# Patient Record
Sex: Male | Born: 1955
Health system: Southern US, Community
[De-identification: ages and names within clinical notes are randomized; demographics above are authoritative.]

## PROBLEM LIST (undated history)

## (undated) DIAGNOSIS — I10 Essential (primary) hypertension: Secondary | ICD-10-CM

## (undated) DIAGNOSIS — M9110 Juvenile osteochondrosis of head of femur [Legg-Calve-Perthes], unspecified leg: Secondary | ICD-10-CM

## (undated) DIAGNOSIS — E119 Type 2 diabetes mellitus without complications: Secondary | ICD-10-CM

## (undated) DIAGNOSIS — E785 Hyperlipidemia, unspecified: Secondary | ICD-10-CM

## (undated) DIAGNOSIS — J45909 Unspecified asthma, uncomplicated: Secondary | ICD-10-CM

## (undated) DIAGNOSIS — K219 Gastro-esophageal reflux disease without esophagitis: Secondary | ICD-10-CM

## (undated) HISTORY — DX: Juvenile osteochondrosis of head of femur (Legg-Calve-Perthes), unspecified leg: M91.10

## (undated) HISTORY — PX: HIP SURGERY: SHX245

## (undated) HISTORY — DX: Type 2 diabetes mellitus without complications: E11.9

## (undated) HISTORY — PX: OTHER SURGICAL HISTORY: SHX169

## (undated) HISTORY — PX: CARDIAC CATHETERIZATION: SHX172

## (undated) HISTORY — DX: Hyperlipidemia, unspecified: E78.5

## (undated) HISTORY — DX: Unspecified asthma, uncomplicated: J45.909

## (undated) HISTORY — DX: Essential (primary) hypertension: I10

---

## 2016-02-26 DIAGNOSIS — K219 Gastro-esophageal reflux disease without esophagitis: Secondary | ICD-10-CM | POA: Diagnosis not present

## 2016-02-26 DIAGNOSIS — J45909 Unspecified asthma, uncomplicated: Secondary | ICD-10-CM | POA: Diagnosis not present

## 2016-02-26 DIAGNOSIS — Z23 Encounter for immunization: Secondary | ICD-10-CM | POA: Diagnosis not present

## 2016-02-26 DIAGNOSIS — R600 Localized edema: Secondary | ICD-10-CM | POA: Diagnosis not present

## 2016-02-26 DIAGNOSIS — E291 Testicular hypofunction: Secondary | ICD-10-CM | POA: Diagnosis not present

## 2016-02-26 DIAGNOSIS — E559 Vitamin D deficiency, unspecified: Secondary | ICD-10-CM | POA: Diagnosis not present

## 2016-02-26 DIAGNOSIS — Z7689 Persons encountering health services in other specified circumstances: Secondary | ICD-10-CM | POA: Diagnosis not present

## 2016-02-26 DIAGNOSIS — E7800 Pure hypercholesterolemia, unspecified: Secondary | ICD-10-CM | POA: Diagnosis not present

## 2016-02-26 DIAGNOSIS — I10 Essential (primary) hypertension: Secondary | ICD-10-CM | POA: Diagnosis not present

## 2016-02-26 DIAGNOSIS — E119 Type 2 diabetes mellitus without complications: Secondary | ICD-10-CM | POA: Diagnosis not present

## 2016-02-26 DIAGNOSIS — M545 Low back pain: Secondary | ICD-10-CM | POA: Diagnosis not present

## 2016-03-04 DIAGNOSIS — E1140 Type 2 diabetes mellitus with diabetic neuropathy, unspecified: Secondary | ICD-10-CM | POA: Diagnosis not present

## 2016-03-04 DIAGNOSIS — K219 Gastro-esophageal reflux disease without esophagitis: Secondary | ICD-10-CM | POA: Diagnosis not present

## 2016-03-04 DIAGNOSIS — F320 Major depressive disorder, single episode, mild: Secondary | ICD-10-CM | POA: Diagnosis not present

## 2016-03-04 DIAGNOSIS — J45909 Unspecified asthma, uncomplicated: Secondary | ICD-10-CM | POA: Diagnosis not present

## 2016-03-28 DIAGNOSIS — E1165 Type 2 diabetes mellitus with hyperglycemia: Secondary | ICD-10-CM | POA: Diagnosis not present

## 2016-03-30 DIAGNOSIS — E1140 Type 2 diabetes mellitus with diabetic neuropathy, unspecified: Secondary | ICD-10-CM | POA: Diagnosis not present

## 2016-03-30 DIAGNOSIS — L0292 Furuncle, unspecified: Secondary | ICD-10-CM | POA: Diagnosis not present

## 2016-03-30 DIAGNOSIS — I10 Essential (primary) hypertension: Secondary | ICD-10-CM | POA: Diagnosis not present

## 2016-03-30 DIAGNOSIS — Z794 Long term (current) use of insulin: Secondary | ICD-10-CM | POA: Diagnosis not present

## 2016-06-29 DIAGNOSIS — E559 Vitamin D deficiency, unspecified: Secondary | ICD-10-CM | POA: Diagnosis not present

## 2016-06-29 DIAGNOSIS — J45909 Unspecified asthma, uncomplicated: Secondary | ICD-10-CM | POA: Diagnosis not present

## 2016-06-29 DIAGNOSIS — F320 Major depressive disorder, single episode, mild: Secondary | ICD-10-CM | POA: Diagnosis not present

## 2016-06-29 DIAGNOSIS — L709 Acne, unspecified: Secondary | ICD-10-CM | POA: Diagnosis not present

## 2016-06-29 DIAGNOSIS — E1140 Type 2 diabetes mellitus with diabetic neuropathy, unspecified: Secondary | ICD-10-CM | POA: Diagnosis not present

## 2016-06-29 DIAGNOSIS — E119 Type 2 diabetes mellitus without complications: Secondary | ICD-10-CM | POA: Diagnosis not present

## 2016-06-29 DIAGNOSIS — E7800 Pure hypercholesterolemia, unspecified: Secondary | ICD-10-CM | POA: Diagnosis not present

## 2016-07-06 DIAGNOSIS — I10 Essential (primary) hypertension: Secondary | ICD-10-CM | POA: Diagnosis not present

## 2016-07-06 DIAGNOSIS — Z125 Encounter for screening for malignant neoplasm of prostate: Secondary | ICD-10-CM | POA: Diagnosis not present

## 2016-07-06 DIAGNOSIS — E1140 Type 2 diabetes mellitus with diabetic neuropathy, unspecified: Secondary | ICD-10-CM | POA: Diagnosis not present

## 2016-07-06 DIAGNOSIS — E349 Endocrine disorder, unspecified: Secondary | ICD-10-CM | POA: Diagnosis not present

## 2016-07-06 DIAGNOSIS — Z1321 Encounter for screening for nutritional disorder: Secondary | ICD-10-CM | POA: Diagnosis not present

## 2016-07-06 DIAGNOSIS — G8929 Other chronic pain: Secondary | ICD-10-CM | POA: Diagnosis not present

## 2016-07-06 DIAGNOSIS — L709 Acne, unspecified: Secondary | ICD-10-CM | POA: Diagnosis not present

## 2016-07-06 DIAGNOSIS — J45909 Unspecified asthma, uncomplicated: Secondary | ICD-10-CM | POA: Diagnosis not present

## 2016-07-14 DIAGNOSIS — Z79899 Other long term (current) drug therapy: Secondary | ICD-10-CM | POA: Diagnosis not present

## 2016-07-14 DIAGNOSIS — M461 Sacroiliitis, not elsewhere classified: Secondary | ICD-10-CM | POA: Diagnosis not present

## 2016-07-14 DIAGNOSIS — Z794 Long term (current) use of insulin: Secondary | ICD-10-CM | POA: Diagnosis not present

## 2016-07-14 DIAGNOSIS — M549 Dorsalgia, unspecified: Secondary | ICD-10-CM | POA: Diagnosis not present

## 2016-07-14 DIAGNOSIS — G8929 Other chronic pain: Secondary | ICD-10-CM | POA: Diagnosis not present

## 2016-07-14 DIAGNOSIS — E119 Type 2 diabetes mellitus without complications: Secondary | ICD-10-CM | POA: Diagnosis not present

## 2016-07-14 DIAGNOSIS — Z5181 Encounter for therapeutic drug level monitoring: Secondary | ICD-10-CM | POA: Diagnosis not present

## 2016-08-01 DIAGNOSIS — E1165 Type 2 diabetes mellitus with hyperglycemia: Secondary | ICD-10-CM | POA: Diagnosis not present

## 2016-10-12 DIAGNOSIS — E7800 Pure hypercholesterolemia, unspecified: Secondary | ICD-10-CM | POA: Diagnosis not present

## 2016-10-12 DIAGNOSIS — F17200 Nicotine dependence, unspecified, uncomplicated: Secondary | ICD-10-CM | POA: Diagnosis not present

## 2016-10-12 DIAGNOSIS — J45909 Unspecified asthma, uncomplicated: Secondary | ICD-10-CM | POA: Diagnosis not present

## 2016-10-12 DIAGNOSIS — E119 Type 2 diabetes mellitus without complications: Secondary | ICD-10-CM | POA: Diagnosis not present

## 2016-10-12 DIAGNOSIS — Z23 Encounter for immunization: Secondary | ICD-10-CM | POA: Diagnosis not present

## 2016-10-26 DIAGNOSIS — E349 Endocrine disorder, unspecified: Secondary | ICD-10-CM | POA: Diagnosis not present

## 2017-03-03 ENCOUNTER — Ambulatory Visit: Payer: Self-pay | Admitting: Family Medicine

## 2017-03-28 ENCOUNTER — Ambulatory Visit: Payer: Self-pay | Admitting: Family Medicine

## 2017-04-21 ENCOUNTER — Ambulatory Visit (INDEPENDENT_AMBULATORY_CARE_PROVIDER_SITE_OTHER): Payer: Medicare Other | Admitting: Family Medicine

## 2017-04-21 ENCOUNTER — Encounter: Payer: Self-pay | Admitting: Family Medicine

## 2017-04-21 DIAGNOSIS — G2581 Restless legs syndrome: Secondary | ICD-10-CM | POA: Diagnosis not present

## 2017-04-21 DIAGNOSIS — J4540 Moderate persistent asthma, uncomplicated: Secondary | ICD-10-CM

## 2017-04-21 DIAGNOSIS — R519 Headache, unspecified: Secondary | ICD-10-CM

## 2017-04-21 DIAGNOSIS — E291 Testicular hypofunction: Secondary | ICD-10-CM | POA: Diagnosis not present

## 2017-04-21 DIAGNOSIS — F329 Major depressive disorder, single episode, unspecified: Secondary | ICD-10-CM | POA: Insufficient documentation

## 2017-04-21 DIAGNOSIS — R251 Tremor, unspecified: Secondary | ICD-10-CM | POA: Diagnosis not present

## 2017-04-21 DIAGNOSIS — E119 Type 2 diabetes mellitus without complications: Secondary | ICD-10-CM

## 2017-04-21 DIAGNOSIS — J45909 Unspecified asthma, uncomplicated: Secondary | ICD-10-CM | POA: Insufficient documentation

## 2017-04-21 DIAGNOSIS — J309 Allergic rhinitis, unspecified: Secondary | ICD-10-CM | POA: Diagnosis not present

## 2017-04-21 DIAGNOSIS — E1165 Type 2 diabetes mellitus with hyperglycemia: Secondary | ICD-10-CM | POA: Insufficient documentation

## 2017-04-21 DIAGNOSIS — R51 Headache: Secondary | ICD-10-CM

## 2017-04-21 DIAGNOSIS — F3341 Major depressive disorder, recurrent, in partial remission: Secondary | ICD-10-CM | POA: Diagnosis not present

## 2017-04-21 DIAGNOSIS — I10 Essential (primary) hypertension: Secondary | ICD-10-CM | POA: Diagnosis not present

## 2017-04-21 DIAGNOSIS — E1159 Type 2 diabetes mellitus with other circulatory complications: Secondary | ICD-10-CM | POA: Insufficient documentation

## 2017-04-21 DIAGNOSIS — Z794 Long term (current) use of insulin: Secondary | ICD-10-CM

## 2017-04-21 DIAGNOSIS — IMO0001 Reserved for inherently not codable concepts without codable children: Secondary | ICD-10-CM

## 2017-04-21 DIAGNOSIS — E1169 Type 2 diabetes mellitus with other specified complication: Secondary | ICD-10-CM | POA: Insufficient documentation

## 2017-04-21 DIAGNOSIS — E785 Hyperlipidemia, unspecified: Secondary | ICD-10-CM | POA: Insufficient documentation

## 2017-04-21 MED ORDER — EMPAGLIFLOZIN-LINAGLIPTIN 25-5 MG PO TABS: 1.0000 | ORAL_TABLET | Freq: Every day | ORAL | 3 refills | Status: DC

## 2017-04-21 MED ORDER — LISINOPRIL 10 MG PO TABS: 10.0000 mg | ORAL_TABLET | Freq: Every day | ORAL | 3 refills | Status: DC

## 2017-04-21 MED ORDER — CITALOPRAM HYDROBROMIDE 10 MG PO TABS: 10.0000 mg | ORAL_TABLET | Freq: Every day | ORAL | 6 refills | Status: DC

## 2017-04-21 MED ORDER — PRIMIDONE 50 MG PO TABS: 100.0000 mg | ORAL_TABLET | Freq: Every day | ORAL | 3 refills | Status: DC

## 2017-04-21 MED ORDER — MELOXICAM 15 MG PO TABS: 15.0000 mg | ORAL_TABLET | Freq: Every day | ORAL | 3 refills | Status: DC

## 2017-04-21 MED ORDER — PREGABALIN 100 MG PO CAPS: 100.0000 mg | ORAL_CAPSULE | Freq: Every day | ORAL | 3 refills | Status: DC

## 2017-04-21 MED ORDER — AZELASTINE HCL 0.1 % NA SOLN
2.0000 | Freq: Two times a day (BID) | NASAL | 11 refills | Status: DC
Start: 2017-04-21 — End: 2017-06-05

## 2017-04-21 MED ORDER — INSULIN LISPRO 100 UNIT/ML (KWIKPEN): PEN_INJECTOR | SUBCUTANEOUS | 6 refills | Status: DC

## 2017-04-21 MED ORDER — AMITRIPTYLINE HCL 50 MG PO TABS: 50.0000 mg | ORAL_TABLET | Freq: Every day | ORAL | 3 refills | Status: DC

## 2017-04-21 MED ORDER — INSULIN GLARGINE 100 UNIT/ML ~~LOC~~ SOLN
75.0000 [IU] | Freq: Every day | SUBCUTANEOUS | 6 refills | Status: DC
Start: 2017-04-21 — End: 2017-09-19

## 2017-04-21 MED ORDER — FLUTICASONE-SALMETEROL 500-50 MCG/DOSE IN AEPB: 1.0000 | INHALATION_SPRAY | Freq: Two times a day (BID) | RESPIRATORY_TRACT | 11 refills | Status: DC

## 2017-04-21 MED ORDER — ALBUTEROL SULFATE HFA 108 (90 BASE) MCG/ACT IN AERS: 2.0000 | INHALATION_SPRAY | Freq: Four times a day (QID) | RESPIRATORY_TRACT | 11 refills | Status: DC | PRN

## 2017-04-21 MED ORDER — MONTELUKAST SODIUM 10 MG PO TABS: 10.0000 mg | ORAL_TABLET | Freq: Every day | ORAL | 11 refills | Status: DC

## 2017-04-21 NOTE — Progress Notes (Signed)
BP 96/62 (BP Location: Left Arm, Patient Position: Sitting, Cuff Size: Normal)   Pulse 94   Temp 98.4 F (36.9 C) (Tympanic)   Ht 5\' 8"  (1.727 m)   Wt 203 lb 9.6 oz (92.4 kg)   SpO2 95%   BMI 30.96 kg/m    Subjective:    Patient ID: Corey [name], male    DOB: 11-09-1955, 62 y.o.   MRN: [id number]  HPI: Corey [name] is a 62 y.o. male  Chief Complaint  Patient presents with  . Establish Care    Patient new to area, need new PCP.   Marland Kitchen. Medication Refill    All medications  . Shaking    Patient states he has been shaking for a while. Getting worse. Went to Insurance account manage[place]urologist [place]. Put patient on Magnesium. Was told it could be early stages of Parkinson's.    Pt here today to establish care.   Hx of insulin dependent DM. Does not check his sugars at home because his machine is broken. Has not been taking his humalog because he's had 2 episodes of hypoglycemia and he's scared to have it happen again. Taking 75 units of lantus once daily. Also taking glyxambi daily. Last A1C was about 6 months ago - doesn't remmeber number, just that it was high.   Taking albuterol and advair for asthma with good control. No concerns there. Does not smoke, quit about 30 years ago.   States he thinks he takes amitriptyline for headaches. Going fairly well.   Takes celexa for depression and feels its going well.   Also has a hx of disc issues in low back, thinks that is what he takes meloxicam for.   Lyrica is for feet and leg pains, only taking one nightly. Also taking 2 primidone nightly for his legs. Was previously being followed by a Neurologist for tremor, feels the magnesium is helping with that and does not want to find a new Neurologist in the area at this time.   Has been on testosterone supplementation, hoping he can come off if it isn't necessary.   Hx of allergies, takes astelin nasal spray and singulair for that.   Takes lisinopril and lasix currently for BPs, not checking at  home. Having some orthostatic sxs.   Last CPE was about 3 months ago.   Relevant past medical, surgical, family and social history reviewed and updated as indicated. Interim medical history since our last visit reviewed. Allergies and medications reviewed and updated.  Review of Systems  Per HPI unless specifically indicated above     Objective:    BP 96/62 (BP Location: Left Arm, Patient Position: Sitting, Cuff Size: Normal)   Pulse 94   Temp 98.4 F (36.9 C) (Tympanic)   Ht 5\' 8"  (1.727 m)   Wt 203 lb 9.6 oz (92.4 kg)   SpO2 95%   BMI 30.96 kg/m   Wt Readings from Last 3 Encounters:  04/21/17 203 lb 9.6 oz (92.4 kg)    Physical Exam  Constitutional: He is oriented to person, place, and time. He appears well-developed and well-nourished. No distress.  HENT:  Head: Atraumatic.  Eyes: Pupils are equal, round, and reactive to light. Conjunctivae are normal. No scleral icterus.  Neck: Normal range of motion. Neck supple.  Cardiovascular: Normal rate and normal heart sounds.  Pulmonary/Chest: Effort normal and breath sounds normal. No respiratory distress.  Musculoskeletal: Normal range of motion.  Neurological: He is alert and oriented to person, place, and time.  Skin: Skin is warm and dry.  Psychiatric: He has a normal mood and affect. His behavior is normal.  Nursing note and vitals reviewed.  Results for orders placed or performed in visit on 04/21/17  HgB A1c  Result Value Ref Range   Hgb A1c MFr Bld 7.1 (H) 4.8 - 5.6 %   Est. average glucose Bld gHb Est-mCnc 157 mg/dL      Assessment & Plan:   Problem List Items Addressed This Visit      Cardiovascular and Mediastinum   Essential hypertension    D/c lasix, continue lisinopril. Monitor for resolution of orthostatic sxs      Relevant Medications   aspirin EC 81 MG tablet   lisinopril (PRINIVIL,ZESTRIL) 10 MG tablet     Respiratory   Asthma    Stable, breathing doing well. Continue current regimen       Relevant Medications   albuterol (PROVENTIL HFA;VENTOLIN HFA) 108 (90 Base) MCG/ACT inhaler   Fluticasone-Salmeterol (ADVAIR) 500-50 MCG/DOSE AEPB   montelukast (SINGULAIR) 10 MG tablet   Allergic rhinitis    Stable, continue current regimen        Endocrine   Insulin dependent diabetes mellitus (HCC)    Will recheck A1C, adjust medications as needed - fax sent for new glucometer and testing supplies. F/u in 3 months      Relevant Medications   aspirin EC 81 MG tablet   Empagliflozin-Linagliptin (GLYXAMBI) 25-5 MG TABS   insulin glargine (LANTUS) 100 UNIT/ML injection   insulin lispro (HUMALOG KWIKPEN) 100 UNIT/ML KiwkPen   lisinopril (PRINIVIL,ZESTRIL) 10 MG tablet   Other Relevant Orders   HgB A1c (Completed)   Hypogonadism male    Declines testosterone check today. Wants to d/c supplementation, has been off in the past at times and states he did well.         Other   RLS (restless legs syndrome)    Stable, continue current regimen with lyrica and primidone      Major depression    Under good control per pt, continue current regimen      Relevant Medications   amitriptyline (ELAVIL) 50 MG tablet   citalopram (CELEXA) 10 MG tablet   Tremor    Pt content for now just taking the magnesium, does not want to see Neurology at this point.       Headache    Stable, continue current regimen with amitriptyline       Relevant Medications   aspirin EC 81 MG tablet   amitriptyline (ELAVIL) 50 MG tablet   citalopram (CELEXA) 10 MG tablet   meloxicam (MOBIC) 15 MG tablet   pregabalin (LYRICA) 100 MG capsule   primidone (MYSOLINE) 50 MG tablet       Follow up plan: Return in about 3 months (around 07/22/2017) for BP, A1C.

## 2017-04-21 NOTE — Assessment & Plan Note (Signed)
Declines testosterone check today. Wants to d/c supplementation, has been off in the past at times and states he did well.

## 2017-04-22 LAB — HEMOGLOBIN A1C
Est. average glucose Bld gHb Est-mCnc: 157 mg/dL
HEMOGLOBIN A1C: 7.1 % — AB (ref 4.8–5.6)

## 2017-04-24 NOTE — Assessment & Plan Note (Signed)
Stable, continue current regimen with lyrica and primidone

## 2017-04-24 NOTE — Patient Instructions (Signed)
Follow up in 3 months

## 2017-04-24 NOTE — Assessment & Plan Note (Signed)
Under good control per pt, continue current regimen

## 2017-04-24 NOTE — Assessment & Plan Note (Signed)
Will recheck A1C, adjust medications as needed - fax sent for new glucometer and testing supplies. F/u in 3 months

## 2017-04-24 NOTE — Assessment & Plan Note (Signed)
Stable, breathing doing well. Continue current regimen

## 2017-04-24 NOTE — Assessment & Plan Note (Signed)
D/c lasix, continue lisinopril. Monitor for resolution of orthostatic sxs

## 2017-04-24 NOTE — Assessment & Plan Note (Signed)
Pt content for now just taking the magnesium, does not want to see Neurology at this point.

## 2017-04-24 NOTE — Assessment & Plan Note (Signed)
Stable, continue current regimen with amitriptyline

## 2017-04-24 NOTE — Assessment & Plan Note (Signed)
Stable, continue current regimen 

## 2017-05-04 ENCOUNTER — Telehealth: Payer: Self-pay | Admitting: Family Medicine

## 2017-05-04 NOTE — Telephone Encounter (Signed)
Last office visit 04/21/17; to establish care Per office note of Roosvelt Maserachel Lane: "fax sent for new glucometer and testing supplies" [facility] [place]

## 2017-05-04 NOTE — Telephone Encounter (Signed)
Copied from [place]. Topic: Quick Communication - Rx Refill/Question >> May 04, 2017 11:34 AM Robina Ade, Helene Kelp D wrote: Medication: Accu-check kit meter Has the patient contacted their pharmacy? Yes (Agent: If no, request that the patient contact the pharmacy for the refill.) Preferred Pharmacy (with phone number or street name): [facility] [phone] - [place], [place] AT [facility] [address] Agent: Please be advised that RX refills may take up to 3 business days. We ask that you follow-up with your pharmacy.

## 2017-05-05 NOTE — Telephone Encounter (Signed)
Please call pharmacy and check that this fax was received, we may need to regenerate order if not

## 2017-05-05 NOTE — Telephone Encounter (Signed)
[facility] Was on hold for extended amount of time. Will try again later.

## 2017-05-08 NOTE — Telephone Encounter (Signed)
Kit and supplies ready for pick up.  Tried calling patient with number given.  No DPR and VM personalized to [employer]. I did not leave message.

## 2017-05-09 NOTE — Telephone Encounter (Signed)
Patient notified

## 2017-05-23 ENCOUNTER — Other Ambulatory Visit: Payer: Self-pay | Admitting: Family Medicine

## 2017-06-02 ENCOUNTER — Telehealth: Payer: Self-pay | Admitting: Family Medicine

## 2017-06-02 NOTE — Telephone Encounter (Signed)
Looked back in patient's chart and see that RX was sent back at the beginning of April and patient was notified that the RX was ready to be picked up. Called [facility] in [place] to see if the supplies were still ready to be picked up and they state that they were put back on the shelf after not being picked up for 12 days. They are going to get them ready for the patient again. Will call and let him know.

## 2017-06-02 NOTE — Telephone Encounter (Signed)
Patient called upset stating he never received the diabetic supplies.  I explained I would check to see if I could find out any information and give him a call back.  Please advise.  Thank you  [phone]

## 2017-06-02 NOTE — Telephone Encounter (Signed)
Patient returned the call to [place].  He asked to be called back at [phone]  Thank you

## 2017-06-02 NOTE — Telephone Encounter (Signed)
Called and let patient know that RX was ready to be picked up at [facility].

## 2017-06-02 NOTE — Telephone Encounter (Signed)
Called and a lady answered. She stated that she was not home and that she would have the patient call back.

## 2017-06-05 ENCOUNTER — Other Ambulatory Visit: Payer: Self-pay | Admitting: Unknown Physician Specialty

## 2017-07-02 ENCOUNTER — Other Ambulatory Visit: Payer: Self-pay | Admitting: Unknown Physician Specialty

## 2017-07-04 NOTE — Telephone Encounter (Signed)
amitriptyline refill Last Refill:06/06/17 # 60 Last OV: 04/21/17 PCP: Roosvelt Maserachel Lane PA Pharmacy:[facility] [phone]  Citalopram refill Last Refill:06/06/17 # 30 Last OV: 04/21/17 PCP: Roosvelt Maserachel Lane PA [facility]

## 2017-07-18 ENCOUNTER — Other Ambulatory Visit: Payer: Self-pay | Admitting: Family Medicine

## 2017-07-18 NOTE — Telephone Encounter (Signed)
Copied from CRM [id number]. Topic: Quick Communication - Rx Refill/Question >> Jul 18, 2017  2:15 PM Tamela OddiHarris, Noni Stonesifer J wrote: Medication: pregabalin (LYRICA) 100 MG capsule  Patient called requesting a refill for medication.   Preferred Pharmacy (with phone number or street name): [facility] [phone] [facility] [place] - [address] [phone] (Phone) [phone] (Fax)

## 2017-07-19 NOTE — Telephone Encounter (Signed)
[facility]ed  [facility] [address] [place] spoke with [place]  Pt has active Rx with refills  Called pt and  Informed

## 2017-07-25 ENCOUNTER — Ambulatory Visit: Payer: Medicare Other | Admitting: Family Medicine

## 2017-07-25 ENCOUNTER — Encounter: Payer: Self-pay | Admitting: Family Medicine

## 2017-07-25 ENCOUNTER — Ambulatory Visit (INDEPENDENT_AMBULATORY_CARE_PROVIDER_SITE_OTHER): Payer: Medicare Other | Admitting: Family Medicine

## 2017-07-25 VITALS — BP 133/80 | HR 89 | Temp 97.9°F | Wt 213.5 lb

## 2017-07-25 DIAGNOSIS — Z23 Encounter for immunization: Secondary | ICD-10-CM

## 2017-07-25 DIAGNOSIS — R251 Tremor, unspecified: Secondary | ICD-10-CM

## 2017-07-25 DIAGNOSIS — G2581 Restless legs syndrome: Secondary | ICD-10-CM | POA: Diagnosis not present

## 2017-07-25 DIAGNOSIS — Z794 Long term (current) use of insulin: Secondary | ICD-10-CM

## 2017-07-25 DIAGNOSIS — E119 Type 2 diabetes mellitus without complications: Secondary | ICD-10-CM | POA: Diagnosis not present

## 2017-07-25 DIAGNOSIS — Z1159 Encounter for screening for other viral diseases: Secondary | ICD-10-CM | POA: Diagnosis not present

## 2017-07-25 DIAGNOSIS — I10 Essential (primary) hypertension: Secondary | ICD-10-CM

## 2017-07-25 DIAGNOSIS — Z114 Encounter for screening for human immunodeficiency virus [HIV]: Secondary | ICD-10-CM

## 2017-07-25 DIAGNOSIS — IMO0001 Reserved for inherently not codable concepts without codable children: Secondary | ICD-10-CM

## 2017-07-25 LAB — UA/M W/RFLX CULTURE, ROUTINE
Bilirubin, UA: NEGATIVE
Ketones, UA: NEGATIVE
LEUKOCYTES UA: NEGATIVE
NITRITE UA: NEGATIVE
PH UA: 5 (ref 5.0–7.5)
PROTEIN UA: NEGATIVE
RBC UA: NEGATIVE
Urobilinogen, Ur: 0.2 mg/dL (ref 0.2–1.0)

## 2017-07-25 LAB — MICROALBUMIN, URINE WAIVED
Creatinine, Urine Waived: 50 mg/dL (ref 10–300)
MICROALB, UR WAIVED: 10 mg/L (ref 0–19)

## 2017-07-25 LAB — BAYER DCA HB A1C WAIVED: HB A1C: 9.2 % — AB (ref <7.0)

## 2017-07-25 MED ORDER — PRIMIDONE 50 MG PO TABS: 100.0000 mg | ORAL_TABLET | Freq: Every day | ORAL | 1 refills | Status: DC

## 2017-07-25 MED ORDER — MELOXICAM 15 MG PO TABS: 15.0000 mg | ORAL_TABLET | Freq: Every day | ORAL | 1 refills | Status: DC

## 2017-07-25 MED ORDER — PREGABALIN 100 MG PO CAPS: 100.0000 mg | ORAL_CAPSULE | Freq: Two times a day (BID) | ORAL | 1 refills | Status: DC

## 2017-07-25 MED ORDER — CITALOPRAM HYDROBROMIDE 10 MG PO TABS: 10.0000 mg | ORAL_TABLET | Freq: Every day | ORAL | 1 refills | Status: DC

## 2017-07-25 MED ORDER — EMPAGLIFLOZIN-LINAGLIPTIN 25-5 MG PO TABS: 1.0000 | ORAL_TABLET | Freq: Every morning | ORAL | 1 refills | Status: DC

## 2017-07-25 MED ORDER — MONTELUKAST SODIUM 10 MG PO TABS: 10.0000 mg | ORAL_TABLET | Freq: Every day | ORAL | 1 refills | Status: DC

## 2017-07-25 MED ORDER — METFORMIN HCL 1000 MG PO TABS: 1000.0000 mg | ORAL_TABLET | Freq: Two times a day (BID) | ORAL | 1 refills | Status: DC

## 2017-07-25 MED ORDER — LISINOPRIL 10 MG PO TABS
10.0000 mg | ORAL_TABLET | Freq: Every day | ORAL | 1 refills | Status: DC
Start: 2017-07-25 — End: 2018-01-22

## 2017-07-25 MED ORDER — AMITRIPTYLINE HCL 50 MG PO TABS: 50.0000 mg | ORAL_TABLET | Freq: Every day | ORAL | 1 refills | Status: DC

## 2017-07-25 NOTE — Assessment & Plan Note (Signed)
Unclear if this is due to ?Parkinsons- will get him into see neurology. Call with any concerns.

## 2017-07-25 NOTE — Assessment & Plan Note (Signed)
Stable on current regimen. Continue current regimen. Continue to monitor. Call with any concerns. Refills given. Checking labs.

## 2017-07-25 NOTE — Assessment & Plan Note (Signed)
Under good control. Not going orthostatic. Will continue current regimen. Continue to monitor. Call with any concerns. Refills given.

## 2017-07-25 NOTE — Patient Instructions (Signed)
Tdap Vaccine (Tetanus, Diphtheria and Pertussis): What You Need to Know 1. Why get vaccinated? Tetanus, diphtheria and pertussis are very serious diseases. Tdap vaccine can protect us from these diseases. And, Tdap vaccine given to pregnant women can protect newborn babies against pertussis. TETANUS (Lockjaw) is rare in the [place] today. It causes painful muscle tightening and stiffness, usually all over the body.  It can lead to tightening of muscles in the head and neck so you can't open your mouth, swallow, or sometimes even breathe. Tetanus kills about 1 out of 10 people who are infected even after receiving the best medical care.  DIPHTHERIA is also rare in the [place] today. It can cause a thick coating to form in the back of the throat.  It can lead to breathing problems, heart failure, paralysis, and death.  PERTUSSIS (Whooping Cough) causes severe coughing spells, which can cause difficulty breathing, vomiting and disturbed sleep.  It can also lead to weight loss, incontinence, and rib fractures. Up to 2 in 100 adolescents and 5 in 100 adults with pertussis are hospitalized or have complications, which could include pneumonia or death.  These diseases are caused by bacteria. Diphtheria and pertussis are spread from person to person through secretions from coughing or sneezing. Tetanus enters the body through cuts, scratches, or wounds. Before vaccines, as many as 200,000 cases of diphtheria, 200,000 cases of pertussis, and hundreds of cases of tetanus, were reported in the [place] each year. Since vaccination began, reports of cases for tetanus and diphtheria have dropped by about 99% and for pertussis by about 80%. 2. Tdap vaccine Tdap vaccine can protect adolescents and adults from tetanus, diphtheria, and pertussis. One dose of Tdap is routinely given at age 11 or 12. People who did not get Tdap at that age should get it as soon as possible. Tdap is especially  important for healthcare professionals and anyone having close contact with a baby younger than 12 months. Pregnant women should get a dose of Tdap during every pregnancy, to protect the newborn from pertussis. Infants are most at risk for severe, life-threatening complications from pertussis. Another vaccine, called Td, protects against tetanus and diphtheria, but not pertussis. A Td booster should be given every 10 years. Tdap may be given as one of these boosters if you have never gotten Tdap before. Tdap may also be given after a severe cut or burn to prevent tetanus infection. Your doctor or the person giving you the vaccine can give you more information. Tdap may safely be given at the same time as other vaccines. 3. Some people should not get this vaccine  A person who has ever had a life-threatening allergic reaction after a previous dose of any diphtheria, tetanus or pertussis containing vaccine, OR has a severe allergy to any part of this vaccine, should not get Tdap vaccine. Tell the person giving the vaccine about any severe allergies.  Anyone who had coma or long repeated seizures within 7 days after a childhood dose of DTP or DTaP, or a previous dose of Tdap, should not get Tdap, unless a cause other than the vaccine was found. They can still get Td.  Talk to your doctor if you: ? have seizures or another nervous system problem, ? had severe pain or swelling after any vaccine containing diphtheria, tetanus or pertussis, ? ever had a condition called Guillain-Barr Syndrome (GBS), ? aren't feeling well on the day the shot is scheduled. 4. Risks With any medicine, including   vaccines, there is a chance of side effects. These are usually mild and go away on their own. Serious reactions are also possible but are rare. Most people who get Tdap vaccine do not have any problems with it. Mild problems following Tdap: (Did not interfere with activities)  Pain where the shot was given (about  3 in 4 adolescents or 2 in 3 adults)  Redness or swelling where the shot was given (about 1 person in 5)  Mild fever of at least 100.4F (up to about 1 in 25 adolescents or 1 in 100 adults)  Headache (about 3 or 4 people in 10)  Tiredness (about 1 person in 3 or 4)  Nausea, vomiting, diarrhea, stomach ache (up to 1 in 4 adolescents or 1 in 10 adults)  Chills, sore joints (about 1 person in 10)  Body aches (about 1 person in 3 or 4)  Rash, swollen glands (uncommon)  Moderate problems following Tdap: (Interfered with activities, but did not require medical attention)  Pain where the shot was given (up to 1 in 5 or 6)  Redness or swelling where the shot was given (up to about 1 in 16 adolescents or 1 in 12 adults)  Fever over 102F (about 1 in 100 adolescents or 1 in 250 adults)  Headache (about 1 in 7 adolescents or 1 in 10 adults)  Nausea, vomiting, diarrhea, stomach ache (up to 1 or 3 people in 100)  Swelling of the entire arm where the shot was given (up to about 1 in 500).  Severe problems following Tdap: (Unable to perform usual activities; required medical attention)  Swelling, severe pain, bleeding and redness in the arm where the shot was given (rare).  Problems that could happen after any vaccine:  People sometimes faint after a medical procedure, including vaccination. Sitting or lying down for about 15 minutes can help prevent fainting, and injuries caused by a fall. Tell your doctor if you feel dizzy, or have vision changes or ringing in the ears.  Some people get severe pain in the shoulder and have difficulty moving the arm where a shot was given. This happens very rarely.  Any medication can cause a severe allergic reaction. Such reactions from a vaccine are very rare, estimated at fewer than 1 in a million doses, and would happen within a few minutes to a few hours after the vaccination. As with any medicine, there is a very remote chance of a vaccine  causing a serious injury or death. The safety of vaccines is always being monitored. For more information, visit: www.cdc.gov/vaccinesafety/ 5. What if there is a serious problem? What should I look for? Look for anything that concerns you, such as signs of a severe allergic reaction, very high fever, or unusual behavior. Signs of a severe allergic reaction can include hives, swelling of the face and throat, difficulty breathing, a fast heartbeat, dizziness, and weakness. These would usually start a few minutes to a few hours after the vaccination. What should I do?  If you think it is a severe allergic reaction or other emergency that can't wait, call 9-1-1 or get the person to the nearest hospital. Otherwise, call your doctor.  Afterward, the reaction should be reported to the Vaccine Adverse Event Reporting System (VAERS). Your doctor might file this report, or you can do it yourself through the VAERS web site at www.vaers.hhs.gov, or by calling 1-800-822-7967. ? VAERS does not give medical advice. 6. The National Vaccine Injury Compensation Program The National   Vaccine Injury Compensation Program (VICP) is a federal program that was created to compensate people who may have been injured by certain vaccines. Persons who believe they may have been injured by a vaccine can learn about the program and about filing a claim by calling 1-800-338-2382 or visiting the VICP website at www.hrsa.gov/vaccinecompensation. There is a time limit to file a claim for compensation. 7. How can I learn more?  Ask your doctor. He or she can give you the vaccine package insert or suggest other sources of information.  Call your local or state health department.  Contact the Centers for Disease Control and Prevention (CDC): ? Call 1-800-232-4636 (1-800-CDC-INFO) or ? Visit CDC's website at www.cdc.gov/vaccines CDC Tdap Vaccine VIS (03/26/13) This information is not intended to replace advice given to you by your  health care provider. Make sure you discuss any questions you have with your health care provider. Document Released: 07/19/2011 Document Revised: 10/08/2015 Document Reviewed: 10/08/2015 Elsevier Interactive Patient Education  2017 Elsevier Inc.  

## 2017-07-25 NOTE — Assessment & Plan Note (Signed)
Not under good control. A1c has gone from 7.1 to 9.3! Cheating on his diet. Will leave meds alone and work on diet. Recheck 3 months. Call with any concerns.

## 2017-07-25 NOTE — Progress Notes (Signed)
BP 133/80 (BP Location: Right Arm, Patient Position: Sitting, Cuff Size: Large)   Pulse 89   Temp 97.9 F (36.6 C)   Wt 213 lb 8 oz (96.8 kg)   SpO2 98%   BMI 32.46 kg/m    Subjective:    Patient ID: Corey [name], male    DOB: 09-Mar-1955, 62 y.o.   MRN: [id number]  HPI: Corey [name] is a 62 y.o. male  Chief Complaint  Patient presents with  . Dizziness    patient states that for years he will get dizzy and shaky wheh he stands up, he was told that he had early stages of Parkinson's disease by previous doctors and others told him that he did not.   . Diabetes   DIABETES Hypoglycemic episodes:no Polydipsia/polyuria: yes Visual disturbance: no Chest pain: no Paresthesias: no Glucose Monitoring: yes  Accucheck frequency: BID  Fasting glucose: 70s-180s Taking Insulin?: yes Blood Pressure Monitoring: not checking Retinal Examination: Not up to Date Foot Exam: Ordered today Diabetic Education: Not Completed Pneumovax: Up to Date Influenza: Up to Date Aspirin: yes  HYPERTENSION Hypertension status: controlled  Satisfied with current treatment? yes Duration of hypertension: chronic BP monitoring frequency:  not checking BP medication side effects:  no Medication compliance: excellent compliance Previous BP meds: lisinopril Aspirin: yes Recurrent headaches: no Visual changes: no Palpitations: no Dyspnea: no Chest pain: no Lower extremity edema: no Dizzy/lightheaded: yes   Relevant past medical, surgical, family and social history reviewed and updated as indicated. Interim medical history since our last visit reviewed. Allergies and medications reviewed and updated.  Review of Systems  Constitutional: Negative.   Respiratory: Negative.   Cardiovascular: Negative.   Musculoskeletal: Positive for back pain and myalgias. Negative for arthralgias, gait problem, joint swelling, neck pain and neck stiffness.  Skin: Negative.   Neurological: Positive for  dizziness and tremors. Negative for seizures, syncope, facial asymmetry, speech difficulty, weakness, light-headedness, numbness and headaches.  Hematological: Negative.   Psychiatric/Behavioral: Negative.     Per HPI unless specifically indicated above     Objective:    BP 133/80 (BP Location: Right Arm, Patient Position: Sitting, Cuff Size: Large)   Pulse 89   Temp 97.9 F (36.6 C)   Wt 213 lb 8 oz (96.8 kg)   SpO2 98%   BMI 32.46 kg/m   Wt Readings from Last 3 Encounters:  07/25/17 213 lb 8 oz (96.8 kg)  04/21/17 203 lb 9.6 oz (92.4 kg)   Orthostatic VS for the past 24 hrs:  BP- Lying Pulse- Lying BP- Sitting Pulse- Sitting BP- Standing at 0 minutes Pulse- Standing at 0 minutes  07/25/17 0904 132/76 90 116/71 99 124/76 93     Physical Exam  Constitutional: He is oriented to person, place, and time. He appears well-developed and well-nourished. No distress.  HENT:  Head: Normocephalic and atraumatic.  Right Ear: Hearing normal.  Left Ear: Hearing normal.  Nose: Nose normal.  Eyes: Conjunctivae and lids are normal. Right eye exhibits no discharge. Left eye exhibits no discharge. No scleral icterus.  Cardiovascular: Normal rate, regular rhythm, normal heart sounds and intact distal pulses. Exam reveals no gallop and no friction rub.  No murmur heard. Pulmonary/Chest: Effort normal and breath sounds normal. No stridor. No respiratory distress. He has no wheezes. He has no rales. He exhibits no tenderness.  Musculoskeletal: Normal range of motion.  Neurological: He is alert and oriented to person, place, and time.  Skin: Skin is warm, dry and intact. Capillary refill  takes less than 2 seconds. No rash noted. He is not diaphoretic. No erythema. No pallor.  Psychiatric: He has a normal mood and affect. His speech is normal and behavior is normal. Judgment and thought content normal. Cognition and memory are normal.  Nursing note and vitals reviewed.   Results for orders  placed or performed in visit on 04/21/17  HgB A1c  Result Value Ref Range   Hgb A1c MFr Bld 7.1 (H) 4.8 - 5.6 %   Est. average glucose Bld gHb Est-mCnc 157 mg/dL      Assessment & Plan:   Problem List Items Addressed This Visit      Cardiovascular and Mediastinum   Essential hypertension - Primary    Under good control. Not going orthostatic. Will continue current regimen. Continue to monitor. Call with any concerns. Refills given.       Relevant Medications   rosuvastatin (CRESTOR) 20 MG tablet   furosemide (LASIX) 20 MG tablet   lisinopril (PRINIVIL,ZESTRIL) 10 MG tablet   Other Relevant Orders   CBC with Differential/Platelet   Comprehensive metabolic panel   Microalbumin, Urine Waived   TSH   UA/M w/rflx Culture, Routine     Endocrine   Insulin dependent diabetes mellitus (HCC)    Not under good control. A1c has gone from 7.1 to 9.3! Cheating on his diet. Will leave meds alone and work on diet. Recheck 3 months. Call with any concerns.       Relevant Medications   rosuvastatin (CRESTOR) 20 MG tablet   metFORMIN (GLUCOPHAGE) 1000 MG tablet   lisinopril (PRINIVIL,ZESTRIL) 10 MG tablet   Empagliflozin-linaGLIPtin (GLYXAMBI) 25-5 MG TABS   Other Relevant Orders   CBC with Differential/Platelet   Bayer DCA Hb A1c Waived   Comprehensive metabolic panel   Lipid Panel w/o Chol/HDL Ratio   Microalbumin, Urine Waived   TSH   UA/M w/rflx Culture, Routine   Ambulatory referral to Ophthalmology   Ambulatory referral to Podiatry     Other   RLS (restless legs syndrome)    Stable on current regimen. Continue current regimen. Continue to monitor. Call with any concerns. Refills given. Checking labs.       Relevant Orders   CBC with Differential/Platelet   Comprehensive metabolic panel   TSH   UA/M w/rflx Culture, Routine   Tremor    Unclear if this is due to ?Parkinsons- will get him into see neurology. Call with any concerns.       Relevant Orders   CBC with  Differential/Platelet   Comprehensive metabolic panel   TSH   UA/M w/rflx Culture, Routine   Ambulatory referral to Neurology    Other Visit Diagnoses    Need for hepatitis C screening test       Labs drawn today. Await results.    Relevant Orders   Hepatitis C Antibody   Screening for HIV (human immunodeficiency virus)       Labs drawn today. Await results.    Relevant Orders   HIV antibody       Follow up plan: Return in about 3 months (around 10/25/2017) for follow up with PCP and AW[facility] on same day.

## 2017-07-26 ENCOUNTER — Telehealth: Payer: Self-pay

## 2017-07-26 ENCOUNTER — Encounter: Payer: Self-pay | Admitting: Family Medicine

## 2017-07-26 LAB — COMPREHENSIVE METABOLIC PANEL
A/G RATIO: 2.2 (ref 1.2–2.2)
ALBUMIN: 4.7 g/dL (ref 3.6–4.8)
ALK PHOS: 103 IU/L (ref 39–117)
ALT: 28 IU/L (ref 0–44)
AST: 17 IU/L (ref 0–40)
BUN / CREAT RATIO: 17 (ref 10–24)
BUN: 20 mg/dL (ref 8–27)
Bilirubin Total: 0.4 mg/dL (ref 0.0–1.2)
CALCIUM: 9.2 mg/dL (ref 8.6–10.2)
CO2: 24 mmol/L (ref 20–29)
Chloride: 94 mmol/L — ABNORMAL LOW (ref 96–106)
Creatinine, Ser: 1.15 mg/dL (ref 0.76–1.27)
GFR calc Af Amer: 80 mL/min/{1.73_m2} (ref >59)
GFR, EST NON AFRICAN AMERICAN: 69 mL/min/{1.73_m2} (ref >59)
GLOBULIN, TOTAL: 2.1 g/dL (ref 1.5–4.5)
Glucose: 150 mg/dL — ABNORMAL HIGH (ref 65–99)
POTASSIUM: 4.8 mmol/L (ref 3.5–5.2)
SODIUM: 137 mmol/L (ref 134–144)
Total Protein: 6.8 g/dL (ref 6.0–8.5)

## 2017-07-26 LAB — HIV ANTIBODY (ROUTINE TESTING W REFLEX): HIV Screen 4th Generation wRfx: NONREACTIVE

## 2017-07-26 LAB — CBC WITH DIFFERENTIAL/PLATELET
BASOS: 0 %
Basophils Absolute: 0 10*3/uL (ref 0.0–0.2)
EOS (ABSOLUTE): 0.3 10*3/uL (ref 0.0–0.4)
EOS: 2 %
Hematocrit: 49.6 % (ref 37.5–51.0)
Hemoglobin: 16.2 g/dL (ref 13.0–17.7)
Immature Grans (Abs): 0.1 10*3/uL (ref 0.0–0.1)
Immature Granulocytes: 1 %
LYMPHS ABS: 2.3 10*3/uL (ref 0.7–3.1)
Lymphs: 22 %
MCH: 31.5 pg (ref 26.6–33.0)
MCHC: 32.7 g/dL (ref 31.5–35.7)
MCV: 97 fL (ref 79–97)
MONOS ABS: 0.6 10*3/uL (ref 0.1–0.9)
Monocytes: 6 %
Neutrophils Absolute: 7.4 10*3/uL — ABNORMAL HIGH (ref 1.4–7.0)
Neutrophils: 69 %
Platelets: 197 10*3/uL (ref 150–450)
RBC: 5.14 x10E6/uL (ref 4.14–5.80)
RDW: 15.3 % (ref 12.3–15.4)
WBC: 10.7 10*3/uL (ref 3.4–10.8)

## 2017-07-26 LAB — LIPID PANEL W/O CHOL/HDL RATIO
Cholesterol, Total: 167 mg/dL (ref 100–199)
HDL: 38 mg/dL — ABNORMAL LOW (ref >39)
LDL Calculated: 73 mg/dL (ref 0–99)
TRIGLYCERIDES: 282 mg/dL — AB (ref 0–149)
VLDL Cholesterol Cal: 56 mg/dL — ABNORMAL HIGH (ref 5–40)

## 2017-07-26 LAB — HEPATITIS C ANTIBODY: Hep C Virus Ab: <0.1 s/co ratio (ref 0.0–0.9)

## 2017-07-26 LAB — TSH: TSH: 4.1 u[IU]/mL (ref 0.450–4.500)

## 2017-07-26 MED ORDER — EMPAGLIFLOZIN 25 MG PO TABS: 25.0000 mg | ORAL_TABLET | Freq: Every day | ORAL | 1 refills | Status: DC

## 2017-07-26 NOTE — Telephone Encounter (Signed)
Medication change Insurance will cover jardiance or [place] They willnot cover [employer]  [facility] [address] [place]

## 2017-07-27 ENCOUNTER — Ambulatory Visit: Payer: Medicare Other

## 2017-07-28 ENCOUNTER — Other Ambulatory Visit: Payer: Self-pay | Admitting: Unknown Physician Specialty

## 2017-08-01 ENCOUNTER — Telehealth: Payer: Self-pay | Admitting: Family Medicine

## 2017-08-01 NOTE — Telephone Encounter (Signed)
Copied from CRM [phone]#[id number]. Topic: Quick Communication - Rx Refill/Question >> Aug 01, 2017  3:46 PM Williams-Neal, Sade R wrote: Medication: ACCU-CHEK GUIDE test strip ... Max 3 times a day due to medicare covering it  Has the patient contacted their pharmacy? Yes (Agent: If no, request that the patient contact the pharmacy for the refill.) (Agent: If yes, when and what did the pharmacy advise?)  Preferred Pharmacy (with phone number or street name): [facility] [id number] - [place], [place] - [address] AT [address] OF [address] [phone] (Phone) [phone] (Fax)      Agent: Please be advised that RX refills may take up to 3 business days. We ask that you follow-up with your pharmacy.

## 2017-08-02 ENCOUNTER — Other Ambulatory Visit: Payer: Self-pay | Admitting: Unknown Physician Specialty

## 2017-08-02 MED ORDER — ACCU-CHEK GUIDE VI STRP: ORAL_STRIP | 12 refills | Status: DC

## 2017-08-02 NOTE — Telephone Encounter (Signed)
Strips reordered to be checked TID

## 2017-09-04 ENCOUNTER — Ambulatory Visit: Payer: Self-pay | Admitting: Podiatry

## 2017-09-10 ENCOUNTER — Other Ambulatory Visit: Payer: Self-pay | Admitting: Unknown Physician Specialty

## 2017-09-14 ENCOUNTER — Ambulatory Visit: Payer: Self-pay | Admitting: Podiatry

## 2017-09-19 ENCOUNTER — Telehealth: Payer: Self-pay | Admitting: Family Medicine

## 2017-09-19 MED ORDER — INSULIN LISPRO 100 UNIT/ML (KWIKPEN): PEN_INJECTOR | SUBCUTANEOUS | 6 refills | Status: DC

## 2017-09-19 MED ORDER — INSULIN GLARGINE 100 UNIT/ML ~~LOC~~ SOLN: 75.0000 [IU] | Freq: Every day | SUBCUTANEOUS | 6 refills | Status: DC

## 2017-09-19 NOTE — Telephone Encounter (Signed)
Copied from CRM [id number]. Topic: General - Other >> Sep 19, 2017 11:27 AM Leafy Roobinson, Norma J wrote: Reason for CRM: pt daughter crystal is calling and her dad is out of insulin. Pt has an appt schedule for 09-21-17. [facility] [address] [place]

## 2017-09-19 NOTE — Telephone Encounter (Signed)
Refills sent

## 2017-09-21 ENCOUNTER — Ambulatory Visit (INDEPENDENT_AMBULATORY_CARE_PROVIDER_SITE_OTHER): Payer: Medicare Other | Admitting: Family Medicine

## 2017-09-21 ENCOUNTER — Encounter: Payer: Self-pay | Admitting: Family Medicine

## 2017-09-21 VITALS — BP 131/74 | HR 91 | Wt 214.0 lb

## 2017-09-21 DIAGNOSIS — G8929 Other chronic pain: Secondary | ICD-10-CM

## 2017-09-21 DIAGNOSIS — M545 Low back pain: Secondary | ICD-10-CM | POA: Diagnosis not present

## 2017-09-21 DIAGNOSIS — Z794 Long term (current) use of insulin: Secondary | ICD-10-CM

## 2017-09-21 DIAGNOSIS — Z23 Encounter for immunization: Secondary | ICD-10-CM

## 2017-09-21 DIAGNOSIS — J309 Allergic rhinitis, unspecified: Secondary | ICD-10-CM | POA: Diagnosis not present

## 2017-09-21 DIAGNOSIS — E119 Type 2 diabetes mellitus without complications: Secondary | ICD-10-CM

## 2017-09-21 DIAGNOSIS — IMO0001 Reserved for inherently not codable concepts without codable children: Secondary | ICD-10-CM

## 2017-09-21 LAB — HM DIABETES EYE EXAM

## 2017-09-21 MED ORDER — LANTUS SOLOSTAR 100 UNIT/ML ~~LOC~~ SOPN: PEN_INJECTOR | SUBCUTANEOUS | 11 refills | Status: DC

## 2017-09-21 MED ORDER — ACCU-CHEK FASTCLIX LANCET KIT: 1.0000 [IU] | PACK | Freq: Two times a day (BID) | 3 refills | Status: AC

## 2017-09-21 MED ORDER — CETIRIZINE HCL 10 MG PO TABS: 10.0000 mg | ORAL_TABLET | Freq: Every day | ORAL | 1 refills | Status: DC

## 2017-09-21 MED ORDER — ROSUVASTATIN CALCIUM 20 MG PO TABS: 20.0000 mg | ORAL_TABLET | Freq: Every day | ORAL | 1 refills | Status: DC

## 2017-09-21 MED ORDER — PEN NEEDLES 31G X 5 MM MISC: 1.0000 [IU] | Freq: Two times a day (BID) | 11 refills | Status: DC

## 2017-09-21 MED ORDER — "INSULIN SYRINGE-NEEDLE U-100 30G X 1/2"" 1 ML MISC": 1.0000 [IU] | Freq: Every day | 3 refills | Status: DC

## 2017-09-21 NOTE — Progress Notes (Signed)
BP 131/74   Pulse 91   Wt 214 lb (97.1 kg)   SpO2 95%   BMI 32.54 kg/m    Subjective:    Patient ID: Corey [name], male    DOB: 1955/07/17, 62 y.o.   MRN: [id number]  HPI: Corey [name] is a 62 y.o. male  Chief Complaint  Patient presents with  . Medication Refill    Needs Insulin - Lantus and Zyrtec. requesting 90 days   Here today for insulin refill. Due for A1C next month. Has been increasing his walking and has been really working on diet. Exercise tolerance limited lately d/t chronic low back pain that used to be managed by narcotic pain medications. Thinks his last MRI was about 3 years ago. No sciatica, weakness, incontinence.   Needs refills on allergy medications. They control sxs well.   Past Medical History:  Diagnosis Date  . Asthma   . Diabetes (HCC)   . Legg-Perthes disease    Social History   Socioeconomic History  . Marital status: Single    Spouse name: Not on file  . Number of children: Not on file  . Years of education: Not on file  . Highest education level: Not on file  Occupational History  . Not on file  Social Needs  . Financial resource strain: Not on file  . Food insecurity:    Worry: Not on file    Inability: Not on file  . Transportation needs:    Medical: Not on file    Non-medical: Not on file  Tobacco Use  . Smoking status: Former Games developermoker  . Smokeless tobacco: Current User  Substance and Sexual Activity  . Alcohol use: Not Currently  . Drug use: Never  . Sexual activity: Not on file  Lifestyle  . Physical activity:    Days per week: Not on file    Minutes per session: Not on file  . Stress: Not on file  Relationships  . Social connections:    Talks on phone: Not on file    Gets together: Not on file    Attends religious service: Not on file    Active member of club or organization: Not on file    Attends meetings of clubs or organizations: Not on file    Relationship status: Not on file  . Intimate partner violence:     Fear of current or ex partner: Not on file    Emotionally abused: Not on file    Physically abused: Not on file    Forced sexual activity: Not on file  Other Topics Concern  . Not on file  Social History Narrative  . Not on file   Relevant past medical, surgical, family and social history reviewed and updated as indicated. Interim medical history since our last visit reviewed. Allergies and medications reviewed and updated.  Review of Systems  Per HPI unless specifically indicated above     Objective:    BP 131/74   Pulse 91   Wt 214 lb (97.1 kg)   SpO2 95%   BMI 32.54 kg/m   Wt Readings from Last 3 Encounters:  09/21/17 214 lb (97.1 kg)  07/25/17 213 lb 8 oz (96.8 kg)  04/21/17 203 lb 9.6 oz (92.4 kg)    Physical Exam  Constitutional: He is oriented to person, place, and time. He appears well-developed and well-nourished.  HENT:  Head: Atraumatic.  Eyes: Conjunctivae and EOM are normal.  Neck: Normal range of motion. Neck supple.  Cardiovascular: Normal rate, regular rhythm and intact distal pulses.  Pulmonary/Chest: Effort normal and breath sounds normal.  Musculoskeletal: Normal range of motion.  Mildly antalgic gait - SLR No midline ttp   Neurological: He is alert and oriented to person, place, and time.  Skin: Skin is warm and dry.  Psychiatric: He has a normal mood and affect. His behavior is normal.  Nursing note and vitals reviewed.   Results for orders placed or performed in visit on 07/25/17  CBC with Differential/Platelet  Result Value Ref Range   WBC 10.7 3.4 - 10.8 x10E3/uL   RBC 5.14 4.14 - 5.80 x10E6/uL   Hemoglobin 16.2 13.0 - 17.7 g/dL   Hematocrit 86.5 78.4 - 51.0 %   MCV 97 79 - 97 fL   MCH 31.5 26.6 - 33.0 pg   MCHC 32.7 31.5 - 35.7 g/dL   RDW 69.6 29.5 - 28.4 %   Platelets 197 150 - 450 x10E3/uL   Neutrophils 69 Not Estab. %   Lymphs 22 Not Estab. %   Monocytes 6 Not Estab. %   Eos 2 Not Estab. %   Basos 0 Not Estab. %    Neutrophils Absolute 7.4 (H) 1.4 - 7.0 x10E3/uL   Lymphocytes Absolute 2.3 0.7 - 3.1 x10E3/uL   Monocytes Absolute 0.6 0.1 - 0.9 x10E3/uL   EOS (ABSOLUTE) 0.3 0.0 - 0.4 x10E3/uL   Basophils Absolute 0.0 0.0 - 0.2 x10E3/uL   Immature Granulocytes 1 Not Estab. %   Immature Grans (Abs) 0.1 0.0 - 0.1 x10E3/uL  Bayer DCA Hb A1c Waived  Result Value Ref Range   HB A1C (BAYER DCA - WAIVED) 9.2 (H) <7.0 %  Comprehensive metabolic panel  Result Value Ref Range   Glucose 150 (H) 65 - 99 mg/dL   BUN 20 8 - 27 mg/dL   Creatinine, Ser 1.32 0.76 - 1.27 mg/dL   GFR calc non Af Amer 69 >59 mL/min/1.73   GFR calc Af Amer 80 >59 mL/min/1.73   BUN/Creatinine Ratio 17 10 - 24   Sodium 137 134 - 144 mmol/L   Potassium 4.8 3.5 - 5.2 mmol/L   Chloride 94 (L) 96 - 106 mmol/L   CO2 24 20 - 29 mmol/L   Calcium 9.2 8.6 - 10.2 mg/dL   Total Protein 6.8 6.0 - 8.5 g/dL   Albumin 4.7 3.6 - 4.8 g/dL   Globulin, Total 2.1 1.5 - 4.5 g/dL   Albumin/Globulin Ratio 2.2 1.2 - 2.2   Bilirubin Total 0.4 0.0 - 1.2 mg/dL   Alkaline Phosphatase 103 39 - 117 IU/L   AST 17 0 - 40 IU/L   ALT 28 0 - 44 IU/L  Lipid Panel w/o Chol/HDL Ratio  Result Value Ref Range   Cholesterol, Total 167 100 - 199 mg/dL   Triglycerides 440 (H) 0 - 149 mg/dL   HDL 38 (L) >10 mg/dL   VLDL Cholesterol Cal 56 (H) 5 - 40 mg/dL   LDL Calculated 73 0 - 99 mg/dL  Microalbumin, Urine Waived  Result Value Ref Range   Microalb, Ur Waived 10 0 - 19 mg/L   Creatinine, Urine Waived 50 10 - 300 mg/dL   Microalb/Creat Ratio <30 <30 mg/g  TSH  Result Value Ref Range   TSH 4.100 0.450 - 4.500 uIU/mL  UA/M w/rflx Culture, Routine  Result Value Ref Range   Specific Gravity, UA <1.005 (L) 1.005 - 1.030   pH, UA 5.0 5.0 - 7.5   Color, UA Yellow Yellow  Appearance Ur Clear Clear   Leukocytes, UA Negative Negative   Protein, UA Negative Negative/Trace   Glucose, UA 3+ (A) Negative   Ketones, UA Negative Negative   RBC, UA Negative Negative    Bilirubin, UA Negative Negative   Urobilinogen, Ur 0.2 0.2 - 1.0 mg/dL   Nitrite, UA Negative Negative  Hepatitis C Antibody  Result Value Ref Range   Hep C Virus Ab <0.1 0.0 - 0.9 s/co ratio  HIV antibody  Result Value Ref Range   HIV Screen 4th Generation wRfx Non Reactive Non Reactive      Assessment & Plan:   Problem List Items Addressed This Visit      Respiratory   Allergic rhinitis    Continue zyrtec and astelin nasal spray        Endocrine   Insulin dependent diabetes mellitus (HCC)    Continue current regimen, recheck next month as scheduled      Relevant Medications   LANTUS SOLOSTAR 100 UNIT/ML Solostar Pen   rosuvastatin (CRESTOR) 20 MG tablet    Other Visit Diagnoses    Chronic midline low back pain without sciatica    -  Primary   Discussed inability to manage pain medications in this setting. Requesting referral to pain clinic for further management. Obtain records of past MRI and tx's   Relevant Orders   Ambulatory referral to Pain Clinic   Needs flu shot       Relevant Orders   Flu Vaccine QUAD 6+ mos PF IM (Fluarix Quad PF) (Completed)       Follow up plan: Return for as scheduled.

## 2017-09-24 NOTE — Patient Instructions (Signed)
Follow up as scheduled.  

## 2017-09-24 NOTE — Assessment & Plan Note (Signed)
Continue zyrtec and astelin nasal spray

## 2017-09-24 NOTE — Assessment & Plan Note (Signed)
Continue current regimen, recheck next month as scheduled

## 2017-10-08 ENCOUNTER — Other Ambulatory Visit: Payer: Self-pay | Admitting: Family Medicine

## 2017-10-12 ENCOUNTER — Ambulatory Visit: Payer: Medicare Other | Admitting: Podiatry

## 2017-10-26 ENCOUNTER — Encounter: Payer: Self-pay | Admitting: Family Medicine

## 2017-10-26 ENCOUNTER — Ambulatory Visit (INDEPENDENT_AMBULATORY_CARE_PROVIDER_SITE_OTHER): Payer: Medicare Other

## 2017-10-26 ENCOUNTER — Ambulatory Visit (INDEPENDENT_AMBULATORY_CARE_PROVIDER_SITE_OTHER): Payer: Medicare Other | Admitting: Family Medicine

## 2017-10-26 ENCOUNTER — Other Ambulatory Visit: Payer: Self-pay

## 2017-10-26 VITALS — BP 130/78 | HR 90 | Temp 99.5°F | Resp 17 | Ht 67.0 in | Wt 222.5 lb

## 2017-10-26 VITALS — BP 130/78 | HR 90 | Temp 97.5°F | Resp 17 | Ht 67.0 in | Wt 222.8 lb

## 2017-10-26 DIAGNOSIS — Z1211 Encounter for screening for malignant neoplasm of colon: Secondary | ICD-10-CM

## 2017-10-26 DIAGNOSIS — J4540 Moderate persistent asthma, uncomplicated: Secondary | ICD-10-CM | POA: Diagnosis not present

## 2017-10-26 DIAGNOSIS — I10 Essential (primary) hypertension: Secondary | ICD-10-CM | POA: Diagnosis not present

## 2017-10-26 DIAGNOSIS — IMO0001 Reserved for inherently not codable concepts without codable children: Secondary | ICD-10-CM

## 2017-10-26 DIAGNOSIS — Z794 Long term (current) use of insulin: Secondary | ICD-10-CM | POA: Diagnosis not present

## 2017-10-26 DIAGNOSIS — Z0000 Encounter for general adult medical examination without abnormal findings: Secondary | ICD-10-CM | POA: Diagnosis not present

## 2017-10-26 DIAGNOSIS — E119 Type 2 diabetes mellitus without complications: Secondary | ICD-10-CM

## 2017-10-26 NOTE — Assessment & Plan Note (Signed)
Stressed importance of smoking cessation, pt not interested at this time

## 2017-10-26 NOTE — Assessment & Plan Note (Signed)
BPs stable and WNL, continue current regimen 

## 2017-10-26 NOTE — Patient Instructions (Addendum)
Corey [name] , Thank you for taking time to come for your Medicare Wellness Visit. I appreciate your ongoing commitment to your health goals. Please review the following plan we discussed and let me know if I can assist you in the future.   Screening recommendations/referrals: Colonoscopy: due now - someone will call to schedule  Recommended yearly ophthalmology/optometry visit for glaucoma screening and checkup Recommended yearly dental visit for hygiene and checkup  Vaccinations: Influenza vaccine: completed 09/21/2017 Pneumococcal vaccine: due at age 21 Tdap vaccine: completed 07/25/2017 Shingles vaccine: shingrix eligible, check with your insurance company for coverage  Advanced directives: Advance directive discussed with you today. Even though you declined this today please call our office should you change your mind and we can give you the proper paperwork for you to fill out.  Conditions/risks identified: Recommend drinking at least 6-8 glasses of water a day   Next appointment: Follow up in one year for your annual wellness exam.   Preventive Care 40-64 Years, Male Preventive care refers to lifestyle choices and visits with your health care provider that can promote health and wellness. What does preventive care include?  A yearly physical exam. This is also called an annual well check.  Dental exams once or twice a year.  Routine eye exams. Ask your health care provider how often you should have your eyes checked.  Personal lifestyle choices, including:  Daily care of your teeth and gums.  Regular physical activity.  Eating a healthy diet.  Avoiding tobacco and drug use.  Limiting alcohol use.  Practicing safe sex.  Taking low-dose aspirin every day starting at age 31. What happens during an annual well check? The services and screenings done by your health care provider during your annual well check will depend on your age, overall health, lifestyle risk factors,  and family history of disease. Counseling  Your health care provider may ask you questions about your:  Alcohol use.  Tobacco use.  Drug use.  Emotional well-being.  Home and relationship well-being.  Sexual activity.  Eating habits.  Work and work Astronomer. Screening  You may have the following tests or measurements:  Height, weight, and BMI.  Blood pressure.  Lipid and cholesterol levels. These may be checked every 5 years, or more frequently if you are over 87 years old.  Skin check.  Lung cancer screening. You may have this screening every year starting at age 108 if you have a 30-pack-year history of smoking and currently smoke or have quit within the past 15 years.  Fecal occult blood test (FOBT) of the stool. You may have this test every year starting at age 48.  Flexible sigmoidoscopy or colonoscopy. You may have a sigmoidoscopy every 5 years or a colonoscopy every 10 years starting at age 79.  Prostate cancer screening. Recommendations will vary depending on your family history and other risks.  Hepatitis C blood test.  Hepatitis B blood test.  Sexually transmitted disease (STD) testing.  Diabetes screening. This is done by checking your blood sugar (glucose) after you have not eaten for a while (fasting). You may have this done every 1-3 years. Discuss your test results, treatment options, and if necessary, the need for more tests with your health care provider. Vaccines  Your health care provider may recommend certain vaccines, such as:  Influenza vaccine. This is recommended every year.  Tetanus, diphtheria, and acellular pertussis (Tdap, Td) vaccine. You may need a Td booster every 10 years.  Zoster vaccine. You may  need this after age 21.  Pneumococcal 13-valent conjugate (PCV13) vaccine. You may need this if you have certain conditions and have not been vaccinated.  Pneumococcal polysaccharide (PPSV23) vaccine. You may need one or two doses if  you smoke cigarettes or if you have certain conditions. Talk to your health care provider about which screenings and vaccines you need and how often you need them. This information is not intended to replace advice given to you by your health care provider. Make sure you discuss any questions you have with your health care provider. Document Released: 02/13/2015 Document Revised: 10/07/2015 Document Reviewed: 11/18/2014 Elsevier Interactive Patient Education  2017 [employer].  Fall Prevention in the Home Falls can cause injuries. They can happen to people of all ages. There are many things you can do to make your home safe and to help prevent falls. What can I do on the outside of my home?  Regularly fix the edges of walkways and driveways and fix any cracks.  Remove anything that might make you trip as you walk through a door, such as a raised step or threshold.  Trim any bushes or trees on the path to your home.  Use bright outdoor lighting.  Clear any walking paths of anything that might make someone trip, such as rocks or tools.  Regularly check to see if handrails are loose or broken. Make sure that both sides of any steps have handrails.  Any raised decks and porches should have guardrails on the edges.  Have any leaves, snow, or ice cleared regularly.  Use sand or salt on walking paths during winter.  Clean up any spills in your garage right away. This includes oil or grease spills. What can I do in the bathroom?  Use night lights.  Install grab bars by the toilet and in the tub and shower. Do not use towel bars as grab bars.  Use non-skid mats or decals in the tub or shower.  If you need to sit down in the shower, use a plastic, non-slip stool.  Keep the floor dry. Clean up any water that spills on the floor as soon as it happens.  Remove soap buildup in the tub or shower regularly.  Attach bath mats securely with double-sided non-slip rug tape.  Do not have  throw rugs and other things on the floor that can make you trip. What can I do in the bedroom?  Use night lights.  Make sure that you have a light by your bed that is easy to reach.  Do not use any sheets or blankets that are too big for your bed. They should not hang down onto the floor.  Have a firm chair that has side arms. You can use this for support while you get dressed.  Do not have throw rugs and other things on the floor that can make you trip. What can I do in the kitchen?  Clean up any spills right away.  Avoid walking on wet floors.  Keep items that you use a lot in easy-to-reach places.  If you need to reach something above you, use a strong step stool that has a grab bar.  Keep electrical cords out of the way.  Do not use floor polish or wax that makes floors slippery. If you must use wax, use non-skid floor wax.  Do not have throw rugs and other things on the floor that can make you trip. What can I do with my stairs?  Do  not leave any items on the stairs.  Make sure that there are handrails on both sides of the stairs and use them. Fix handrails that are broken or loose. Make sure that handrails are as long as the stairways.  Check any carpeting to make sure that it is firmly attached to the stairs. Fix any carpet that is loose or worn.  Avoid having throw rugs at the top or bottom of the stairs. If you do have throw rugs, attach them to the floor with carpet tape.  Make sure that you have a light switch at the top of the stairs and the bottom of the stairs. If you do not have them, ask someone to add them for you. What else can I do to help prevent falls?  Wear shoes that:  Do not have high heels.  Have rubber bottoms.  Are comfortable and fit you well.  Are closed at the toe. Do not wear sandals.  If you use a stepladder:  Make sure that it is fully opened. Do not climb a closed stepladder.  Make sure that both sides of the stepladder are  locked into place.  Ask someone to hold it for you, if possible.  Clearly mark and make sure that you can see:  Any grab bars or handrails.  First and last steps.  Where the edge of each step is.  Use tools that help you move around (mobility aids) if they are needed. These include:  Canes.  Walkers.  Scooters.  Crutches.  Turn on the lights when you go into a dark area. Replace any light bulbs as soon as they burn out.  Set up your furniture so you have a clear path. Avoid moving your furniture around.  If any of your floors are uneven, fix them.  If there are any pets around you, be aware of where they are.  Review your medicines with your doctor. Some medicines can make you feel dizzy. This can increase your chance of falling. Ask your doctor what other things that you can do to help prevent falls. This information is not intended to replace advice given to you by your health care provider. Make sure you discuss any questions you have with your health care provider. Document Released: 11/13/2008 Document Revised: 06/25/2015 Document Reviewed: 02/21/2014 Elsevier Interactive Patient Education  2017 [employer].

## 2017-10-26 NOTE — Assessment & Plan Note (Signed)
Not compliant with low carb diet, work on improvements there and add more activity. Await A1C results and adjust as needed

## 2017-10-26 NOTE — Patient Instructions (Signed)
If you are currently taking glyxambi, you can stop it and just continue the other diabetes medicines

## 2017-10-26 NOTE — Progress Notes (Signed)
BP 130/78   Pulse 90   Temp 99.5 F (37.5 C) (Oral)   Resp 17   Ht 5\' 7"  (1.702 m)   Wt 222 lb 8 oz (100.9 kg)   SpO2 94%   BMI 34.85 kg/m    Subjective:    Patient ID: Corey [name], male    DOB: 02-08-1955, 62 y.o.   MRN: [id number]  HPI: Corey [name] is a 62 y.o. male  Chief Complaint  Patient presents with  . Diabetes  . Hypertension   Here today for diabetes f/u. Home sugars have been averaging 80s -100 in the morning and up to 200 during the day with eating. Not doing well staying on track with his diet. Taking 75 units of lantus and humalog 15 units BID daily along with metformin and jardiance. No hypoglycemic episodes. Not exercising regularly.   BPs WNL when checked at home. Taking medications faithfully without side effects. Denies CP, SOB, dizziness, HAs.   Breathing under fairly good control with advair and albuterol. Occasional night wheezes.   Past Medical History:  Diagnosis Date  . Asthma   . Diabetes (HCC)   . Hyperlipidemia   . Hypertension   . Legg-Perthes disease    Social History   Socioeconomic History  . Marital status: Divorced    Spouse name: Not on file  . Number of children: Not on file  . Years of education: Not on file  . Highest education level: 8th grade  Occupational History  . Not on file  Social Needs  . Financial resource strain: Somewhat hard  . Food insecurity:    Worry: Never true    Inability: Never true  . Transportation needs:    Medical: No    Non-medical: No  Tobacco Use  . Smoking status: Former Smoker    Packs/day: 0.50    Types: Cigarettes  . Smokeless tobacco: Current User    Types: Chew  . Tobacco comment: quit 40 years ago   Substance and Sexual Activity  . Alcohol use: Not Currently  . Drug use: Never  . Sexual activity: Not on file  Lifestyle  . Physical activity:    Days per week: 0 days    Minutes per session: 0 min  . Stress: Not at all  Relationships  . Social connections:    Talks on  phone: Once a week    Gets together: More than three times a week    Attends religious service: Never    Active member of club or organization: No    Attends meetings of clubs or organizations: Never    Relationship status: Divorced  . Intimate partner violence:    Fear of current or ex partner: No    Emotionally abused: No    Physically abused: No    Forced sexual activity: No  Other Topics Concern  . Not on file  Social History Narrative  . Not on file    Relevant past medical, surgical, family and social history reviewed and updated as indicated. Interim medical history since our last visit reviewed. Allergies and medications reviewed and updated.  Review of Systems  Per HPI unless specifically indicated above     Objective:    BP 130/78   Pulse 90   Temp 99.5 F (37.5 C) (Oral)   Resp 17   Ht 5\' 7"  (1.702 m)   Wt 222 lb 8 oz (100.9 kg)   SpO2 94%   BMI 34.85 kg/m   Wt Readings from  Last 3 Encounters:  10/26/17 222 lb 8 oz (100.9 kg)  10/26/17 222 lb 12.8 oz (101.1 kg)  09/21/17 214 lb (97.1 kg)    Physical Exam  Constitutional: He is oriented to person, place, and time. He appears well-developed and well-nourished. No distress.  HENT:  Head: Atraumatic.  Eyes: Conjunctivae and EOM are normal.  Neck: Normal range of motion. Neck supple.  Cardiovascular: Normal rate and regular rhythm.  Pulmonary/Chest: Effort normal and breath sounds normal.  Musculoskeletal: Normal range of motion.  Neurological: He is alert and oriented to person, place, and time.  Skin: Skin is warm and dry.  Psychiatric: He has a normal mood and affect. His behavior is normal.  Nursing note and vitals reviewed.   Results for orders placed or performed in visit on 09/25/17  HM DIABETES EYE EXAM  Result Value Ref Range   HM Diabetic Eye Exam No Retinopathy No Retinopathy      Assessment & Plan:   Problem List Items Addressed This Visit      Cardiovascular and Mediastinum    Essential hypertension    BPs stable and WNL, continue current regimen        Respiratory   Asthma    Breathing stable, encouraged avoidance of tobacco products and second hand smoke as well as controlling allergies well        Endocrine   Insulin dependent diabetes mellitus (HCC) - Primary    Not compliant with low carb diet, work on improvements there and add more activity. Await A1C results and adjust as needed      Relevant Orders   Comprehensive metabolic panel   HgB A1c       Follow up plan: Return in about 3 months (around 01/25/2018) for DM, chol, BP.

## 2017-10-26 NOTE — Progress Notes (Signed)
Subjective:   Corey [name] is a 62 y.o. male who presents for an Initial Medicare Annual Wellness Visit.  Review of Systems  Cardiac Risk Factors include: advanced age (>26mn, >>77women);hypertension;male gender;dyslipidemia;diabetes mellitus;obesity (BMI >30kg/m2);smoking/ tobacco exposure    Objective:    Today's Vitals   10/26/17 0947  BP: 130/78  Pulse: 90  Resp: 17  Temp: (!) 97.5 F (36.4 C)  TempSrc: Temporal  SpO2: 94%  Weight: 222 lb 12.8 oz (101.1 kg)  Height: 5' 7"  (1.702 m)   Body mass index is 34.9 kg/m.  Advanced Directives 10/26/2017  Does Patient Have a Medical Advance Directive? No  Would patient like information on creating a medical advance directive? No - Patient declined    Current Medications (verified) Outpatient Encounter Medications as of 10/26/2017  Medication Sig  . ACCU-CHEK FASTCLIX LANCETS MISC USE TO CHECK BLOOD SUGAR  . ACCU-CHEK GUIDE test strip USE TO CHECK BLOOD SUGAR TID  . albuterol (PROVENTIL HFA;VENTOLIN HFA) 108 (90 Base) MCG/ACT inhaler Inhale 2 puffs into the lungs every 6 (six) hours as needed for wheezing or shortness of breath.  .Marland Kitchenamitriptyline (ELAVIL) 50 MG tablet Take 1-2 tablets (50-100 mg total) by mouth at bedtime.  .Marland Kitchenaspirin EC 81 MG tablet Take 81 mg by mouth daily.  .Marland Kitchenazelastine (ASTELIN) 0.1 % nasal spray USE 2 SPRAYS IN EACH NOSTRIL TWICE DAILY  . Blood Glucose Monitoring Suppl (ACCU-CHEK GUIDE) w/Device KIT U UTD  . cetirizine (ZYRTEC) 10 MG tablet Take 1 tablet (10 mg total) by mouth daily.  . Cholecalciferol (VITAMIN D3) 5000 units TABS Take by mouth.  . citalopram (CELEXA) 10 MG tablet Take 1 tablet (10 mg total) by mouth daily.  . diclofenac sodium (VOLTAREN) 1 % GEL Apply topically 4 (four) times daily.  . empagliflozin (JARDIANCE) 25 MG TABS tablet Take 25 mg by mouth daily.  . Fluticasone-Salmeterol (ADVAIR) 500-50 MCG/DOSE AEPB Inhale 1 puff into the lungs 2 (two) times daily.  . furosemide (LASIX) 20  MG tablet Take 20 mg by mouth 2 (two) times daily.  .Marland KitchenGLYXAMBI 25-5 MG TABS TK 1 T PO QAM  . insulin lispro (HUMALOG KWIKPEN) 100 UNIT/ML KiwkPen Take 5 units QAM, 7 units QPM  . Insulin Pen Needle (PEN NEEDLES) 31G X 5 MM MISC 1 Units by Does not apply route 2 (two) times daily.  . Insulin Syringe-Needle U-100 30G X 1/2" 1 ML MISC 1 Units by Does not apply route daily.  . Lancets Misc. (ACCU-CHEK FASTCLIX LANCET) KIT 1 Units by Does not apply route 2 (two) times daily.  .Marland KitchenLANTUS SOLOSTAR 100 UNIT/ML Solostar Pen INJECT 75 UNITS Advance D. NO FURTHER REFILLS WITHOUT OFFICE VISIT  . lisinopril (PRINIVIL,ZESTRIL) 10 MG tablet Take 1 tablet (10 mg total) by mouth daily.  . magnesium gluconate (MAGONATE) 500 MG tablet Take 500 mg by mouth daily.  . meloxicam (MOBIC) 15 MG tablet Take 1 tablet (15 mg total) by mouth daily.  . metFORMIN (GLUCOPHAGE) 1000 MG tablet Take 1 tablet (1,000 mg total) by mouth 2 (two) times daily.  . montelukast (SINGULAIR) 10 MG tablet Take 1 tablet (10 mg total) by mouth daily.  .Marland KitchenNEXIUM 40 MG capsule TK 1 C PO D  . pregabalin (LYRICA) 100 MG capsule Take 1 capsule (100 mg total) by mouth 2 (two) times daily.  . primidone (MYSOLINE) 50 MG tablet Take 2 tablets (100 mg total) by mouth daily.  . rosuvastatin (CRESTOR) 20 MG tablet Take 1 tablet (20  mg total) by mouth daily.   No facility-administered encounter medications on file as of 10/26/2017.     Allergies (verified) Patient has no known allergies.   History: Past Medical History:  Diagnosis Date  . Asthma   . Diabetes ([place])   . Hyperlipidemia   . Hypertension   . Legg-Perthes disease    Past Surgical History:  Procedure Laterality Date  . Foot Surgery    . HIP SURGERY     Family History  Problem Relation Age of Onset  . Cancer Mother   . Heart disease Mother   . Diabetes Father    Social History   Socioeconomic History  . Marital status: Divorced    Spouse name: Not on file  . Number of children:  Not on file  . Years of education: Not on file  . Highest education level: 8th grade  Occupational History  . Not on file  Social Needs  . Financial resource strain: Somewhat hard  . Food insecurity:    Worry: Never true    Inability: Never true  . Transportation needs:    Medical: No    Non-medical: No  Tobacco Use  . Smoking status: Former Smoker    Packs/day: 0.50    Types: Cigarettes  . Smokeless tobacco: Current User    Types: Chew  . Tobacco comment: quit 40 years ago   Substance and Sexual Activity  . Alcohol use: Not Currently  . Drug use: Never  . Sexual activity: Not on file  Lifestyle  . Physical activity:    Days per week: 0 days    Minutes per session: 0 min  . Stress: Not at all  Relationships  . Social connections:    Talks on phone: Once a week    Gets together: More than three times a week    Attends religious service: Never    Active member of club or organization: No    Attends meetings of clubs or organizations: Never    Relationship status: Divorced  Other Topics Concern  . Not on file  Social History Narrative  . Not on file   Tobacco Counseling Ready to quit: No Counseling given: Yes Comment: quit 40 years ago    Clinical Intake:  Pre-visit preparation completed: Yes  Pain : No/denies pain     Nutritional Status: BMI > 30  Obese Nutritional Risks: None Diabetes: Yes CBG done?: No Did pt. bring in CBG monitor from home?: No  How often do you need to have someone help you when you read instructions, pamphlets, or other written materials from your doctor or pharmacy?: 4 - Often What is the last grade level you completed in school?: 8th grade  Interpreter Needed?: No  Information entered by :: Corey Parekh,LPN   Activities of Daily Living In your present state of health, do you have any difficulty performing the following activities: 10/26/2017 07/25/2017  Hearing? N N  Vision? N N  Difficulty concentrating or making decisions?  N N  Walking or climbing stairs? N N  Dressing or bathing? N N  Doing errands, shopping? N N  Preparing Food and eating ? N -  Using the Toilet? N -  In the past six months, have you accidently leaked urine? N -  Do you have problems with loss of bowel control? N -  Managing your Medications? N -  Managing your Finances? N -  Housekeeping or managing your Housekeeping? N -     Immunizations and Health  Maintenance Immunization History  Administered Date(s) Administered  . Influenza,inj,Quad PF,6+ Mos 09/21/2017  . Influenza-Unspecified 10/13/2016  . Pneumococcal Polysaccharide-23 02/26/2016  . Tdap 07/25/2017  . Zoster Recombinat (Shingrix) 07/14/2016   Health Maintenance Due  Topic Date Due  . OPHTHALMOLOGY EXAM  05/26/1967  . COLONOSCOPY  05/26/2007    Patient Care Team: Volney American, PA-C as PCP - General (Family Medicine)  Indicate any recent Medical Services you may have received from other than [facility] providers in the past year (date may be approximate).    Assessment:   This is a routine wellness examination for [place].  Hearing/Vision screen Vision Screening Comments: Sees Dr.Woodard annually   Dietary issues and exercise activities discussed: Current Exercise Habits: The patient does not participate in regular exercise at present, Exercise limited by: None identified  Goals    . DIET - INCREASE WATER INTAKE     Recommend drinking at least 6-8 glasses of water a day       Depression Screen PHQ 2/9 Scores 10/26/2017 07/25/2017  PHQ - 2 Score 0 0  PHQ- 9 Score - 2    Fall Risk Fall Risk  10/26/2017 09/21/2017 07/25/2017  Falls in the past year? No No No    Is the patient's home free of loose throw rugs in walkways, pet beds, electrical cords, etc?   no      Grab bars in the bathroom? no      Handrails on the stairs?   no stairs       Adequate lighting?   yes  Timed Get Up and Go performed: Completed in 8 seconds with no use of assistive devices,  steady gait. No intervention needed at this time.   Cognitive Function:     6CIT Screen 10/26/2017  What Year? 0 points  What month? 0 points  What time? 0 points  Count back from 20 0 points  Months in reverse 0 points  Repeat phrase 2 points  Total Score 2    Screening Tests Health Maintenance  Topic Date Due  . OPHTHALMOLOGY EXAM  05/26/1967  . COLONOSCOPY  05/26/2007  . HEMOGLOBIN A1C  01/24/2018  . FOOT EXAM  07/26/2018  . TETANUS/TDAP  07/26/2027  . INFLUENZA VACCINE  Completed  . PNEUMOCOCCAL POLYSACCHARIDE VACCINE AGE 58-64 HIGH RISK  Completed  . Hepatitis C Screening  Completed  . HIV Screening  Completed   Influenza: completed 09/21/2017 Tetanus: completed 07/25/2017 Pneumococcal: pneumovax 23 done 02/26/2016   Qualifies for Shingles Vaccine? Yes, discussed shingrix vaccine   Cancer Screenings: Lung: Low Dose CT Chest recommended if Age 66-80 years, 30 pack-year currently smoking OR have quit w/in 15years. Patient does not qualify. Colorectal: due now , referral sent   Additional Screenings:  Hepatitis C Screening: completed 07/25/2017      Plan:    I have personally reviewed and addressed the Medicare Annual Wellness questionnaire and have noted the following in the patient's chart:  A. Medical and social history B. Use of alcohol, tobacco or illicit drugs  C. Current medications and supplements D. Functional ability and status E.  Nutritional status F.  Physical activity G. Advance directives H. List of other physicians I.  Hospitalizations, surgeries, and ER visits in previous 12 months J.  [place] such as hearing and vision if needed, cognitive and depression L. Referrals and appointments   In addition, I have reviewed and discussed with patient certain preventive protocols, quality metrics, and best practice recommendations. A written personalized  care plan for preventive services as well as general preventive health recommendations  were provided to patient.   Signed,  Tyler Aas, LPN Nurse Health Advisor   Nurse Notes: States he had diabetic eye exam done with Dr.Woodard. Will call and get results faxed over.

## 2017-10-26 NOTE — Assessment & Plan Note (Addendum)
Breathing stable, encouraged avoidance of tobacco products and second hand smoke as well as controlling allergies well

## 2017-10-27 LAB — COMPREHENSIVE METABOLIC PANEL
A/G RATIO: 2.1 (ref 1.2–2.2)
ALT: 31 IU/L (ref 0–44)
AST: 21 IU/L (ref 0–40)
Albumin: 4.4 g/dL (ref 3.6–4.8)
Alkaline Phosphatase: 147 IU/L — ABNORMAL HIGH (ref 39–117)
BILIRUBIN TOTAL: 0.3 mg/dL (ref 0.0–1.2)
BUN/Creatinine Ratio: 13 (ref 10–24)
BUN: 14 mg/dL (ref 8–27)
CO2: 26 mmol/L (ref 20–29)
CREATININE: 1.06 mg/dL (ref 0.76–1.27)
Calcium: 9.9 mg/dL (ref 8.6–10.2)
Chloride: 97 mmol/L (ref 96–106)
GFR calc Af Amer: 88 mL/min/{1.73_m2} (ref >59)
GFR calc non Af Amer: 76 mL/min/{1.73_m2} (ref >59)
Globulin, Total: 2.1 g/dL (ref 1.5–4.5)
Glucose: 182 mg/dL — ABNORMAL HIGH (ref 65–99)
POTASSIUM: 4.6 mmol/L (ref 3.5–5.2)
SODIUM: 139 mmol/L (ref 134–144)
Total Protein: 6.5 g/dL (ref 6.0–8.5)

## 2017-10-27 LAB — HEMOGLOBIN A1C
Est. average glucose Bld gHb Est-mCnc: 212 mg/dL
HEMOGLOBIN A1C: 9 % — AB (ref 4.8–5.6)

## 2017-10-30 ENCOUNTER — Ambulatory Visit: Payer: Medicare Other | Admitting: Podiatry

## 2017-11-01 ENCOUNTER — Other Ambulatory Visit: Payer: Self-pay | Admitting: Family Medicine

## 2017-11-01 MED ORDER — SITAGLIPTIN PHOSPHATE 25 MG PO TABS: 25.0000 mg | ORAL_TABLET | Freq: Every day | ORAL | 2 refills | Status: DC

## 2017-11-20 ENCOUNTER — Other Ambulatory Visit: Payer: Self-pay | Admitting: Family Medicine

## 2017-11-20 NOTE — Telephone Encounter (Signed)
See request as patient is asking for 90 day supply is possible.

## 2017-11-20 NOTE — Telephone Encounter (Signed)
Copied from CRM [id number]. Topic: Quick Communication - Rx Refill/Question >> Nov 20, 2017  9:46 AM Burchel, Abbi R wrote: Medication: azelastine (ASTELIN) 0.1 % nasal spray, NEXIUM 40 MG capsule, albuterol (PROVENTIL HFA;VENTOLIN HFA) 108 (90 Base) MCG/ACT inhaler    Pt is requesting a 90 day supply for each of these medications.   Preferred Pharmacy: [id number] [place], [place] - [address] AT [facility] OF [address] [place] [address] Phone: [phone] Fax: [phone]  Pt was advised that RX refills may take up to 3 business days. We ask that you follow-up with your pharmacy.

## 2017-11-21 MED ORDER — ALBUTEROL SULFATE HFA 108 (90 BASE) MCG/ACT IN AERS: 2.0000 | INHALATION_SPRAY | Freq: Four times a day (QID) | RESPIRATORY_TRACT | 1 refills | Status: DC | PRN

## 2017-11-21 MED ORDER — NEXIUM 40 MG PO CPDR: 40.0000 mg | DELAYED_RELEASE_CAPSULE | Freq: Every day | ORAL | 1 refills | Status: DC | PRN

## 2017-11-21 MED ORDER — AZELASTINE HCL 0.1 % NA SOLN: 2.0000 | Freq: Two times a day (BID) | NASAL | 6 refills | Status: DC

## 2017-11-21 NOTE — Telephone Encounter (Signed)
See note; pt. Is requesting 90 day supply.

## 2017-11-21 NOTE — Telephone Encounter (Signed)
Requested Prescriptions  Pending Prescriptions Disp Refills  . NEXIUM 40 MG capsule  0     Gastroenterology: Proton Pump Inhibitors Passed - 11/20/2017 10:23 AM      Passed - Valid encounter within last 12 months    Recent Outpatient Visits          3 weeks ago Insulin dependent diabetes mellitus [facility])   [facility] [place], [place], [place]   2 months ago Chronic midline low back pain without sciatica   [facility] Roosvelt Maser [place], [place]   3 months ago Essential hypertension   [facility] [place], [place], DO   7 months ago Insulin dependent diabetes mellitus [facility])   [facility] Part[place], [place]      Future Appointments            In 2 months Maurice March, Salley Hews, PA-C [facility] [place], [facility]         . azelastine (ASTELIN) 0.1 % nasal spray 30 mL 0    Sig: Place 2 sprays into both nostrils 2 (two) times daily. Use in each nostril as directed     Ear, Nose, and Throat: Nasal Preparations - Antiallergy Passed - 11/20/2017 10:23 AM      Passed - Valid encounter within last 12 months    Recent Outpatient Visits          3 weeks ago Insulin dependent diabetes mellitus [facility])   [facility] [place], [place], [place]   2 months ago Chronic midline low back pain without sciatica   [facility] Roosvelt Maser [place], [place]   3 months ago Essential hypertension   [facility] [place], [place], DO   7 months ago Insulin dependent diabetes mellitus [facility])   [facility] Particia [place], [place]      Future Appointments            In 2 months Maurice March, Salley Hews, PA-C [facility], [facility]         . albuterol (PROVENTIL HFA;VENTOLIN HFA) 108 (90 Base) MCG/ACT inhaler 3 Inhaler 1    Sig: Inhale 2 puffs into the lungs every 6 (six) hours as needed for wheezing or shortness of breath.     Pulmonology:  Beta Agonists Failed - 11/20/2017  10:23 AM      Failed - One inhaler should last at least one month. If the patient is requesting refills earlier, contact the patient to check for uncontrolled symptoms.      Passed - Valid encounter within last 12 months    Recent Outpatient Visits          3 weeks ago Insulin dependent diabetes mellitus [facility])   [facility] [place], [place], [place]   2 months ago Chronic midline low back pain without sciatica   [facility] Roosvelt Maser [place], [place]   3 months ago Essential hypertension   [facility] [place], [place], DO   7 months ago Insulin dependent diabetes mellitus [facility])   [facility] Particia [place], [place]      Future Appointments            In 2 months Maurice March Salley Hews, PA-C [facility], [place]         Last OV:  10/26/17 Future OV: 01/29/18 PCP: Roosvelt Maser  Last refill on Azelastine: 10/09/17; #30 ml.; no refills Pt. meets protocol for refill, but is requesting a 90 day supply.  Last refill on Nexium; 06/29/17 per Historical provider ; is requesting 90 day supply.

## 2017-11-22 ENCOUNTER — Encounter: Payer: Self-pay | Admitting: *Deleted

## 2017-11-27 ENCOUNTER — Other Ambulatory Visit: Payer: Self-pay | Admitting: Family Medicine

## 2017-11-27 NOTE — Telephone Encounter (Signed)
Copied from CRM #[id number]. Topic: Quick Communication - Rx Refill/Question >> Nov 27, 2017 10:39 AM Jay Schlichter wrote: Medication: advair disc   Has the patient contacted their pharmacy?yes  ent: If no, request that the patient contact the pharmacy for the refill.) (Agent: If yes, when and what did the pharmacy advise?) pharm said to call dr   Preferred Pharmacy (with phone number or street name):[id number] - [place], [place] - [address] Agent: Please be advised that RX refills may take up to 3 business days. We ask that you follow-up with your pharmacy.

## 2017-11-28 ENCOUNTER — Other Ambulatory Visit: Payer: Self-pay

## 2017-11-28 DIAGNOSIS — Z1211 Encounter for screening for malignant neoplasm of colon: Secondary | ICD-10-CM

## 2017-11-28 MED ORDER — FLUTICASONE-SALMETEROL 500-50 MCG/DOSE IN AEPB: 1.0000 | INHALATION_SPRAY | Freq: Two times a day (BID) | RESPIRATORY_TRACT | 6 refills | Status: DC

## 2017-11-29 ENCOUNTER — Encounter: Payer: Medicare Other | Admitting: Podiatry

## 2017-12-04 ENCOUNTER — Telehealth: Payer: Self-pay | Admitting: Gastroenterology

## 2017-12-04 NOTE — Telephone Encounter (Signed)
Pt is calling to cancel procedure 12/12/2017 he will be out of town

## 2017-12-05 NOTE — Telephone Encounter (Signed)
Procedure cancelled with [facility].

## 2017-12-06 NOTE — Progress Notes (Signed)
This encounter was created in error - please disregard.

## 2017-12-12 ENCOUNTER — Ambulatory Visit: Admit: 2017-12-12 | Payer: Medicare Other | Admitting: Gastroenterology

## 2017-12-12 SURGERY — COLONOSCOPY WITH PROPOFOL
Anesthesia: General

## 2018-01-15 ENCOUNTER — Other Ambulatory Visit: Payer: Self-pay | Admitting: Family Medicine

## 2018-01-15 NOTE — Telephone Encounter (Signed)
Copied from CRM [phone]. Topic: Quick Communication - Rx Refill/Question >> Jan 15, 2018  3:12 PM Floria RavelingStovall, Shana A wrote: Medication: furosemide (LASIX) 20 MG tablet [[id number]][[id number]]  Has the patient contacted their pharmacy? No - Dr in [place] was prescribing this before  (Agent: If no, request that the patient contact the pharmacy for the refill.) (Agent: If yes, when and what did the pharmacy advise?)  Preferred Pharmacy (with phone number or street name):[id number] - [place],  - [address]  Agent: Please be advised that RX refills may take up to 3 business days. We ask that you follow-up with your pharmacy.

## 2018-01-16 ENCOUNTER — Other Ambulatory Visit: Payer: Self-pay | Admitting: Family Medicine

## 2018-01-16 MED ORDER — FUROSEMIDE 20 MG PO TABS: 20.0000 mg | ORAL_TABLET | Freq: Two times a day (BID) | ORAL | 0 refills | Status: DC

## 2018-01-16 MED ORDER — EMPAGLIFLOZIN 25 MG PO TABS: 25.0000 mg | ORAL_TABLET | Freq: Every day | ORAL | 0 refills | Status: DC

## 2018-01-16 NOTE — Telephone Encounter (Signed)
Requested medication (s) are due for refill today: historical med  Requested medication (s) are on the active medication list: yes  Last refill:  07/25/17   Future visit scheduled: in 1 week  Notes to clinic:  Historical medication   Requested Prescriptions  Pending Prescriptions Disp Refills   furosemide (LASIX) 20 MG tablet 30 tablet     Sig: Take 1 tablet (20 mg total) by mouth 2 (two) times daily.     Cardiovascular:  Diuretics - Loop Passed - 01/15/2018  6:48 PM      Passed - K in normal range and within 360 days    Potassium  Date Value Ref Range Status  10/26/2017 4.6 3.5 - 5.2 mmol/L Final         Passed - Ca in normal range and within 360 days    Calcium  Date Value Ref Range Status  10/26/2017 9.9 8.6 - 10.2 mg/dL Final         Passed - Na in normal range and within 360 days    Sodium  Date Value Ref Range Status  10/26/2017 139 134 - 144 mmol/L Final         Passed - Cr in normal range and within 360 days    Creatinine, Ser  Date Value Ref Range Status  10/26/2017 1.06 0.76 - 1.27 mg/dL Final         Passed - Last BP in normal range    BP Readings from Last 1 Encounters:  10/26/17 130/78         Passed - Valid encounter within last 6 months    Recent Outpatient Visits          2 months ago Insulin dependent diabetes mellitus [facility])   [facility] [place], [place], PA-C   3 months ago Chronic midline low back pain without sciatica   [facility] [place], [place], [place]   5 months ago Essential hypertension   [facility] [place], [place], [place]   9 months ago Insulin dependent diabetes mellitus [facility](HCC)   [facility] [place], [place] He[place], [place]      Future Appointments            In 1 week Maurice MarchLane, Salley Hewsachel [place], PA-C [facility]

## 2018-01-16 NOTE — Telephone Encounter (Signed)
Copied from CRM [id number]. Topic: Quick Communication - Rx Refill/Question >> Jan 16, 2018  1:01 PM Raoul PitchWilliams-Neal, Sade R wrote: Medication:empagliflozin (JARDIANCE) 25 MG TABS tablet  Has the patient contacted their pharmacy?Yes Preferred Pharm[facility]with phone number or street name[id number] [place], [place] - [address] AT [facility] [address] [phone] (Phone) [phone] (Fax)    Agent: Please be advised that RX refills may take up to 3 business days. We ask that you follow-up with your pharmacy.

## 2018-01-18 ENCOUNTER — Other Ambulatory Visit: Payer: Self-pay | Admitting: Family Medicine

## 2018-01-18 NOTE — Telephone Encounter (Signed)
Requested medication (s) are due for refill today: Yes  Requested medication (s) are on the active medication list: Yes  Last refill:  10/12/17  Future visit scheduled: Yes  Notes to clinic:  Historical provider    Requested Prescriptions  Pending Prescriptions Disp Refills   GLYXAMBI 25-5 MG TABS [Pharmacy Med Name: GLYXAMBI 25MG/5MG TABLETS] 90 tablet 1    Sig: TAKE 1 [place]     Endocrinology: Diabetes - DPP-4 Inhibitor + SGLT2 Inhibitor Combos Failed - 01/18/2018  3:33 AM      Failed - HBA1C is between 0 and 7.9 and within 180 days    Hgb A1c MFr Bld  Date Value Ref Range Status  10/26/2017 9.0 (H) 4.8 - 5.6 % Final    Comment:             Prediabetes: 5.7 - 6.4          Diabetes: >6.4          Glycemic control for adults with diabetes: <7.0          Passed - Cr in normal range and within 360 days    Creatinine, Ser  Date Value Ref Range Status  10/26/2017 1.06 0.76 - 1.27 mg/dL Final         Passed - eGFR in normal range and within 360 days    GFR calc Af Amer  Date Value Ref Range Status  10/26/2017 88 >59 mL/min/1.73 Final   GFR calc non Af Amer  Date Value Ref Range Status  10/26/2017 76 >59 mL/min/1.73 Final         Passed - LDL in normal range and within 360 days    LDL Calculated  Date Value Ref Range Status  07/25/2017 73 0 - 99 mg/dL Final         Passed - Valid encounter within last 6 months    Recent Outpatient Visits          2 months ago Insulin dependent diabetes mellitus [facility])   [place], [place], PA-C   3 months ago Chronic midline low back pain without sciatica   [facility], [place]   5 months ago Essential hypertension   [place], Ramona, DO   9 months ago Insulin dependent diabetes mellitus [facility])   [facility] [place], [place]      Future Appointments            In 1 week Orene Desanctis, Lilia Argue,  PA-C [facility], [facility]

## 2018-01-21 ENCOUNTER — Other Ambulatory Visit: Payer: Self-pay | Admitting: Family Medicine

## 2018-01-22 ENCOUNTER — Other Ambulatory Visit: Payer: Self-pay | Admitting: Family Medicine

## 2018-01-29 ENCOUNTER — Ambulatory Visit (INDEPENDENT_AMBULATORY_CARE_PROVIDER_SITE_OTHER): Payer: Medicare Other | Admitting: Family Medicine

## 2018-01-29 ENCOUNTER — Encounter: Payer: Self-pay | Admitting: Nurse Practitioner

## 2018-01-29 ENCOUNTER — Encounter: Payer: Self-pay | Admitting: Family Medicine

## 2018-01-29 VITALS — BP 122/71 | HR 88 | Temp 98.4°F | Ht 68.0 in | Wt 222.9 lb

## 2018-01-29 DIAGNOSIS — E119 Type 2 diabetes mellitus without complications: Secondary | ICD-10-CM | POA: Diagnosis not present

## 2018-01-29 DIAGNOSIS — Z794 Long term (current) use of insulin: Secondary | ICD-10-CM

## 2018-01-29 DIAGNOSIS — E782 Mixed hyperlipidemia: Secondary | ICD-10-CM | POA: Diagnosis not present

## 2018-01-29 DIAGNOSIS — F3341 Major depressive disorder, recurrent, in partial remission: Secondary | ICD-10-CM | POA: Diagnosis not present

## 2018-01-29 DIAGNOSIS — E7800 Pure hypercholesterolemia, unspecified: Secondary | ICD-10-CM

## 2018-01-29 DIAGNOSIS — IMO0001 Reserved for inherently not codable concepts without codable children: Secondary | ICD-10-CM

## 2018-01-29 DIAGNOSIS — I10 Essential (primary) hypertension: Secondary | ICD-10-CM | POA: Diagnosis not present

## 2018-01-29 LAB — LP+ALT+AST PICCOLO, WAIVED
ALT (SGPT) Piccolo, Waived: 35 U/L (ref 10–47)
AST (SGOT) Piccolo, Waived: 22 U/L (ref 11–38)
Chol/HDL Ratio Piccolo,Waive: 4 mg/dL
Cholesterol Piccolo, Waived: 157 mg/dL (ref <200)
HDL Chol Piccolo, Waived: 40 mg/dL — ABNORMAL LOW (ref >59)
LDL Chol Calc Piccolo Waived: 59 mg/dL (ref <100)
TRIGLYCERIDES PICCOLO,WAIVED: 292 mg/dL — AB (ref <150)
VLDL Chol Calc Piccolo,Waive: 58 mg/dL — ABNORMAL HIGH (ref <30)

## 2018-01-29 LAB — BAYER DCA HB A1C WAIVED: HB A1C (BAYER DCA - WAIVED): 8.8 % — ABNORMAL HIGH (ref <7.0)

## 2018-01-29 MED ORDER — CITALOPRAM HYDROBROMIDE 10 MG PO TABS: 10.0000 mg | ORAL_TABLET | Freq: Every day | ORAL | 1 refills | Status: DC

## 2018-01-29 MED ORDER — MELOXICAM 15 MG PO TABS: ORAL_TABLET | ORAL | 1 refills | Status: DC

## 2018-01-29 MED ORDER — NEXIUM 40 MG PO CPDR: 40.0000 mg | DELAYED_RELEASE_CAPSULE | Freq: Every day | ORAL | 1 refills | Status: DC | PRN

## 2018-01-29 MED ORDER — METFORMIN HCL 1000 MG PO TABS: 1000.0000 mg | ORAL_TABLET | Freq: Two times a day (BID) | ORAL | 1 refills | Status: DC

## 2018-01-29 MED ORDER — PREGABALIN 100 MG PO CAPS: 100.0000 mg | ORAL_CAPSULE | Freq: Two times a day (BID) | ORAL | 1 refills | Status: DC

## 2018-01-29 MED ORDER — FUROSEMIDE 20 MG PO TABS: 20.0000 mg | ORAL_TABLET | Freq: Two times a day (BID) | ORAL | 1 refills | Status: DC

## 2018-01-29 MED ORDER — CETIRIZINE HCL 10 MG PO TABS: 10.0000 mg | ORAL_TABLET | Freq: Every day | ORAL | 1 refills | Status: DC

## 2018-01-29 MED ORDER — SEMAGLUTIDE(0.25 OR 0.5MG/DOS) 2 MG/1.5ML ~~LOC~~ SOPN: 0.2500 mg | PEN_INJECTOR | SUBCUTANEOUS | 2 refills | Status: DC

## 2018-01-29 MED ORDER — AMITRIPTYLINE HCL 50 MG PO TABS: 50.0000 mg | ORAL_TABLET | Freq: Every day | ORAL | 1 refills | Status: DC

## 2018-01-29 MED ORDER — ROSUVASTATIN CALCIUM 20 MG PO TABS: 20.0000 mg | ORAL_TABLET | Freq: Every day | ORAL | 1 refills | Status: DC

## 2018-01-29 MED ORDER — PRIMIDONE 50 MG PO TABS: 100.0000 mg | ORAL_TABLET | Freq: Every day | ORAL | 1 refills | Status: DC

## 2018-01-29 NOTE — Progress Notes (Signed)
BP 122/71 (BP Location: Right Arm, Patient Position: Sitting, Cuff Size: Normal)   Pulse 88   Temp 98.4 F (36.9 C) (Oral)   Ht 5\' 8"  (1.727 m)   Wt 222 lb 14.4 oz (101.1 kg)   SpO2 96%   BMI 33.89 kg/m    Subjective:    Patient ID: Corey [name], male    DOB: 1955/08/23, 62 y.o.   MRN: [id number]  HPI: Corey [name] is a 62 y.o. male  Chief Complaint  Patient presents with  . Diabetes    3 month F/U no complaints  . Hypertension  . Hyperthyroidism   Here today for 6 month f/u.   DM - On avererage, sugars staying in the low 100s. Did have a low blood sugar spell last week one evening but was able to eat and recover from there. Sugar was 50 at the time and he was symptomatic. Currently on metformin, januvia, jardiance, lantus at 75 units, and humalog 5 units QAM and 7 units QPM. Last A1C was 9.0. Feels like he's doing well with diet, does not exercise.   On crestor for cholesterol management. No Cp, SOB, myalgias, claudication.  HTN under good control. Compliant with medications, no side effects.   Moods stable with celexa. No SI/HI.   [facility] 10/26/2017 07/25/2017  Decreased Interest 0 0  Down, Depressed, Hopeless 0 0  PHQ - 2 Score 0 0  Altered sleeping - 2  Tired, decreased energy - 0  Change in appetite - 0  Feeling bad or failure about yourself  - 0  Trouble concentrating - 0  Moving slowly or fidgety/restless - 0  Suicidal thoughts - 0  PHQ-9 Score - 2  Difficult doing work/chores - Not difficult at all    Relevant past medical, surgical, family and social history reviewed and updated as indicated. Interim medical history since our last visit reviewed. Allergies and medications reviewed and updated.  Review of Systems  Per HPI unless specifically indicated above     Objective:    BP 122/71 (BP Location: Right Arm, Patient Position: Sitting, Cuff Size: Normal)   Pulse 88   Temp 98.4 F (36.9 C) (Oral)   Ht 5\' 8"  (1.727 m)   Wt  222 lb 14.4 oz (101.1 kg)   SpO2 96%   BMI 33.89 kg/m   Wt Readings from Last 3 Encounters:  01/29/18 222 lb 14.4 oz (101.1 kg)  10/26/17 222 lb 8 oz (100.9 kg)  10/26/17 222 lb 12.8 oz (101.1 kg)    Physical Exam Vitals signs and nursing note reviewed.  Constitutional:      Appearance: Normal appearance.  HENT:     Head: Atraumatic.  Eyes:     Extraocular Movements: Extraocular movements intact.     Conjunctiva/sclera: Conjunctivae normal.  Neck:     Musculoskeletal: Normal range of motion and neck supple.  Cardiovascular:     Rate and Rhythm: Normal rate and regular rhythm.  Pulmonary:     Effort: Pulmonary effort is normal.     Breath sounds: Normal breath sounds.  Musculoskeletal: Normal range of motion.  Skin:    General: Skin is warm and dry.  Neurological:     General: No focal deficit present.     Mental Status: He is oriented to person, place, and time.  Psychiatric:        Mood and Affect: Mood normal.        Thought Content: Thought content normal.  Judgment: Judgment normal.     Results for orders placed or performed in visit on 01/29/18  LP+ALT+AST Piccolo, Waived  Result Value Ref Range   ALT (SGPT) Piccolo, Waived 35 10 - 47 U/L   AST (SGOT) Piccolo, Waived 22 11 - 38 U/L   Cholesterol Piccolo, Waived 157 <200 mg/dL   HDL Chol Piccolo, Waived 40 (L) >59 mg/dL   Triglycerides Piccolo,Waived 292 (H) <150 mg/dL   Chol/HDL Ratio Piccolo,Waive 4.0 mg/dL   LDL Chol Calc Piccolo Waived 59 <100 mg/dL   VLDL Chol Calc Piccolo,Waive 58 (H) <30 mg/dL  Bayer DCA Hb W0JA1c Waived  Result Value Ref Range   HB A1C (BAYER DCA - WAIVED) 8.8 (H) <7.0 %  Basic metabolic panel  Result Value Ref Range   Glucose 242 (H) 65 - 99 mg/dL   BUN 14 8 - 27 mg/dL   Creatinine, Ser 8.111.32 (H) 0.76 - 1.27 mg/dL   GFR calc non Af Amer 58 (L) >59 mL/min/1.73   GFR calc Af Amer 67 >59 mL/min/1.73   BUN/Creatinine Ratio 11 10 - 24   Sodium 136 134 - 144 mmol/L   Potassium 4.4  3.5 - 5.2 mmol/L   Chloride 92 (L) 96 - 106 mmol/L   CO2 24 20 - 29 mmol/L   Calcium 10.0 8.6 - 10.2 mg/dL      Assessment & Plan:   Problem List Items Addressed This Visit      Cardiovascular and Mediastinum   Essential hypertension - Primary    BPs under good control, continue current regimen       Relevant Medications   furosemide (LASIX) 20 MG tablet   rosuvastatin (CRESTOR) 20 MG tablet   Other Relevant Orders   Basic metabolic panel (Completed)     Endocrine   Insulin dependent diabetes mellitus (HCC)    A1C still not at goal. D/c humalog and start ozempic at 0.25 mg weekly. May increase if tolerated. If A1C still not under better control will refer to Endocrinology for management. Continue working on lifestyle modifications      Relevant Medications   rosuvastatin (CRESTOR) 20 MG tablet   metFORMIN (GLUCOPHAGE) 1000 MG tablet   Semaglutide,0.25 or 0.5MG /DOS, (OZEMPIC, 0.25 OR 0.5 MG/DOSE,) 2 MG/1.5ML SOPN   Other Relevant Orders   Bayer DCA Hb A1c Waived (Completed)     Other   Major depression    Stable on celexa. Continue current regimen      Relevant Medications   citalopram (CELEXA) 10 MG tablet   amitriptyline (ELAVIL) 50 MG tablet   Hyperlipidemia    Stable on crestor, continue current regimen      Relevant Medications   furosemide (LASIX) 20 MG tablet   rosuvastatin (CRESTOR) 20 MG tablet   Other Relevant Orders   LP+ALT+AST Piccolo, Waived (Completed)       Follow up plan: Return in about 4 weeks (around 02/26/2018) for DM.

## 2018-01-29 NOTE — Patient Instructions (Signed)
Stop humalog, start ozempic at 0.25 mg once weekly

## 2018-01-30 LAB — BASIC METABOLIC PANEL
BUN / CREAT RATIO: 11 (ref 10–24)
BUN: 14 mg/dL (ref 8–27)
CO2: 24 mmol/L (ref 20–29)
Calcium: 10 mg/dL (ref 8.6–10.2)
Chloride: 92 mmol/L — ABNORMAL LOW (ref 96–106)
Creatinine, Ser: 1.32 mg/dL — ABNORMAL HIGH (ref 0.76–1.27)
GFR calc non Af Amer: 58 mL/min/{1.73_m2} — ABNORMAL LOW (ref >59)
GFR, EST AFRICAN AMERICAN: 67 mL/min/{1.73_m2} (ref >59)
Glucose: 242 mg/dL — ABNORMAL HIGH (ref 65–99)
Potassium: 4.4 mmol/L (ref 3.5–5.2)
Sodium: 136 mmol/L (ref 134–144)

## 2018-02-03 DIAGNOSIS — E785 Hyperlipidemia, unspecified: Secondary | ICD-10-CM | POA: Insufficient documentation

## 2018-02-03 NOTE — Assessment & Plan Note (Signed)
Stable on celexa. Continue current regimen

## 2018-02-03 NOTE — Assessment & Plan Note (Addendum)
A1C still not at goal. D/c humalog and start ozempic at 0.25 mg weekly. May increase if tolerated. If A1C still not under better control will refer to Endocrinology for management. Continue working on lifestyle modifications

## 2018-02-03 NOTE — Assessment & Plan Note (Signed)
Stable on crestor, continue current regimen

## 2018-02-03 NOTE — Assessment & Plan Note (Addendum)
BPs under good control, continue current regimen

## 2018-02-05 ENCOUNTER — Telehealth: Payer: Self-pay

## 2018-02-05 NOTE — Telephone Encounter (Signed)
PA for Ozempic was approved. Key: [id number]

## 2018-02-12 ENCOUNTER — Telehealth: Payer: Self-pay | Admitting: Family Medicine

## 2018-02-12 NOTE — Telephone Encounter (Signed)
Please call pt and clarify that he should be taking either the glyxambi OR [place] and jardiance separate - I noticed when we combined them to reduce pill burden with the glyxambi the separate components did not fall off medication list. If the glyxambi is covered he should just take that one. Please remove whichever is appropriate from medication list based on conversation.

## 2018-02-12 NOTE — Telephone Encounter (Signed)
Called patient, he is not sure, he will bring in his medication for [place] to go over and verify what he is taking.

## 2018-02-13 NOTE — Telephone Encounter (Signed)
Please call pharmacy and cancel the [place] and jardiance, make sure the glyxambi is active - he currently is getting all 3 filled accidentally

## 2018-02-13 NOTE — Telephone Encounter (Signed)
Spoke with pharmacy. Jardiance and Januvia canceled, glyxambi still active with 1 refill.

## 2018-02-20 ENCOUNTER — Other Ambulatory Visit: Payer: Self-pay | Admitting: Family Medicine

## 2018-02-20 NOTE — Telephone Encounter (Signed)
Requested medication (s) are due for refill today: yes  Requested medication (s) are on the active medication list: yes  Last refill:  01/29/18  Future visit scheduled: yes  Notes to clinic:  Not delegated    Requested Prescriptions  Pending Prescriptions Disp Refills   primidone (MYSOLINE) 50 MG tablet [Pharmacy Med Name: PRIMIDONE 50MG  TABLETS] 180 tablet 1    Sig: TAKE 2 TABLETS(100 MG) BY MOUTH DAILY     Not Delegated - Neurology:  Anticonvulsants Failed - 02/20/2018  3:33 AM      Failed - This refill cannot be delegated      Passed - HCT in normal range and within 360 days    Hematocrit  Date Value Ref Range Status  07/25/2017 49.6 37.5 - 51.0 % Final         Passed - HGB in normal range and within 360 days    Hemoglobin  Date Value Ref Range Status  07/25/2017 16.2 13.0 - 17.7 g/dL Final         Passed - PLT in normal range and within 360 days    Platelets  Date Value Ref Range Status  07/25/2017 197 150 - 450 x10E3/uL Final         Passed - WBC in normal range and within 360 days    WBC  Date Value Ref Range Status  07/25/2017 10.7 3.4 - 10.8 x10E3/uL Final         Passed - Valid encounter within last 12 months    Recent Outpatient Visits          3 weeks ago Essential hypertension   [facility] M[place], [place]   3 months ago Insulin dependent diabetes mellitus [facility])   [facility], [place], [place]   5 months ago Chronic midline low back pain without sciatica   [facility] Maser [place], [place]   7 months ago Essential hypertension   [facility] [place], Megan P, DO   10 months ago Insulin dependent diabetes mellitus [facility])   [facility] [place], Salley Hews, [place]      Future Appointments            In 1 week Maurice March, [place] Hews, PA-C [facility], [facility]

## 2018-03-02 ENCOUNTER — Ambulatory Visit (INDEPENDENT_AMBULATORY_CARE_PROVIDER_SITE_OTHER): Payer: Medicare Other | Admitting: Family Medicine

## 2018-03-02 ENCOUNTER — Encounter: Payer: Self-pay | Admitting: Family Medicine

## 2018-03-02 VITALS — BP 143/77 | HR 87 | Temp 98.6°F | Wt 230.3 lb

## 2018-03-02 DIAGNOSIS — J4540 Moderate persistent asthma, uncomplicated: Secondary | ICD-10-CM | POA: Diagnosis not present

## 2018-03-02 DIAGNOSIS — E119 Type 2 diabetes mellitus without complications: Secondary | ICD-10-CM | POA: Diagnosis not present

## 2018-03-02 DIAGNOSIS — Z794 Long term (current) use of insulin: Secondary | ICD-10-CM | POA: Diagnosis not present

## 2018-03-02 DIAGNOSIS — IMO0001 Reserved for inherently not codable concepts without codable children: Secondary | ICD-10-CM

## 2018-03-02 LAB — BAYER DCA HB A1C WAIVED: HB A1C (BAYER DCA - WAIVED): 9.7 % — ABNORMAL HIGH (ref <7.0)

## 2018-03-02 NOTE — Assessment & Plan Note (Signed)
A1C worsened almost 1 point since last month. Increase ozempic to 0.5 mg dose, work on reducing starchy foods. If still not improving will send to Endocrinology for further management. Continue lantus, glyxambi, and metformin

## 2018-03-02 NOTE — Progress Notes (Addendum)
BP (!) 143/77 (BP Location: Left Arm, Cuff Size: Normal)   Pulse 87   Temp 98.6 F (37 C) (Oral)   Wt 230 lb 4.8 oz (104.5 kg)   SpO2 95%   BMI 35.02 kg/m    Subjective:    Patient ID: Corey [name], male    DOB: 20-Nov-1955, 63 y.o.   MRN: [id number]  HPI: Corey [name] is a 63 y.o. male  Chief Complaint  Patient presents with  . Diabetes    4 week f/up   Here today for 1 month DM f/u after addition of 0.25 mg ozempic. Tolerating well, no side effects noted. Still taking 75 units of lantus daily, metformin, and glyxambi. No low blood sugar spells. Eats a fair amount of bread but otherwise states he avoids sweets and other bad for you foods. Does not exercise. Checks home sugars, states they are all over the place from 120s-200s.   Also requesting home albuterol nebulizer machine for his persistent asthma sxs. Fairly good control with singulair, advair, and albuterol prn but does still having some wheezing spells at home. Has done breathing treatments in clinics before with excellent benefit.   Relevant past medical, surgical, family and social history reviewed and updated as indicated. Interim medical history since our last visit reviewed. Allergies and medications reviewed and updated.  Review of Systems  Per HPI unless specifically indicated above     Objective:    BP (!) 143/77 (BP Location: Left Arm, Cuff Size: Normal)   Pulse 87   Temp 98.6 F (37 C) (Oral)   Wt 230 lb 4.8 oz (104.5 kg)   SpO2 95%   BMI 35.02 kg/m   Wt Readings from Last 3 Encounters:  04/04/18 230 lb (104.3 kg)  03/02/18 230 lb 4.8 oz (104.5 kg)  01/29/18 222 lb 14.4 oz (101.1 kg)    Physical Exam Vitals signs and nursing note reviewed.  Constitutional:      Appearance: Normal appearance.  HENT:     Head: Atraumatic.  Eyes:     Extraocular Movements: Extraocular movements intact.     Conjunctiva/sclera: Conjunctivae normal.  Neck:     Musculoskeletal: Normal range of motion and neck  supple.  Cardiovascular:     Rate and Rhythm: Normal rate and regular rhythm.  Pulmonary:     Effort: Pulmonary effort is normal.     Breath sounds: Normal breath sounds.  Musculoskeletal: Normal range of motion.  Skin:    General: Skin is warm and dry.  Neurological:     General: No focal deficit present.     Mental Status: He is oriented to person, place, and time.  Psychiatric:        Mood and Affect: Mood normal.        Thought Content: Thought content normal.        Judgment: Judgment normal.     Results for orders placed or performed in visit on 03/02/18  Basic Metabolic Panel (BMET)  Result Value Ref Range   Glucose 282 (H) 65 - 99 mg/dL   BUN 20 8 - 27 mg/dL   Creatinine, Ser 3.22 0.76 - 1.27 mg/dL   GFR calc non Af Amer 66 >59 mL/min/1.73   GFR calc Af Amer 76 >59 mL/min/1.73   BUN/Creatinine Ratio 17 10 - 24   Sodium 137 134 - 144 mmol/L   Potassium 4.5 3.5 - 5.2 mmol/L   Chloride 97 96 - 106 mmol/L   CO2 20 20 - 29 mmol/L  Calcium 8.9 8.6 - 10.2 mg/dL  Bayer DCA Hb O1L Waived  Result Value Ref Range   HB A1C (BAYER DCA - WAIVED) 9.7 (H) <7.0 %      Assessment & Plan:   Problem List Items Addressed This Visit      Respiratory   Asthma    Rx sent for nebulizer machine and supplies. Use prn in addition to current regimen        Endocrine   Insulin dependent diabetes mellitus (HCC) - Primary    A1C worsened almost 1 point since last month. Increase ozempic to 0.5 mg dose, work on reducing starchy foods. If still not improving will send to Endocrinology for further management. Continue lantus, glyxambi, and metformin      Relevant Orders   Basic Metabolic Panel (BMET) (Completed)   Bayer DCA Hb A1c Waived (Completed)       Follow up plan: Return in about 2 months (around 05/01/2018) for DM.

## 2018-03-02 NOTE — Patient Instructions (Signed)
Increase ozempic to the 0.5 mg dose once weekly

## 2018-03-03 LAB — BASIC METABOLIC PANEL
BUN/Creatinine Ratio: 17 (ref 10–24)
BUN: 20 mg/dL (ref 8–27)
CO2: 20 mmol/L (ref 20–29)
Calcium: 8.9 mg/dL (ref 8.6–10.2)
Chloride: 97 mmol/L (ref 96–106)
Creatinine, Ser: 1.19 mg/dL (ref 0.76–1.27)
GFR calc Af Amer: 76 mL/min/{1.73_m2} (ref >59)
GFR, EST NON AFRICAN AMERICAN: 66 mL/min/{1.73_m2} (ref >59)
GLUCOSE: 282 mg/dL — AB (ref 65–99)
Potassium: 4.5 mmol/L (ref 3.5–5.2)
Sodium: 137 mmol/L (ref 134–144)

## 2018-03-05 ENCOUNTER — Encounter: Payer: Self-pay | Admitting: Family Medicine

## 2018-03-07 ENCOUNTER — Telehealth: Payer: Self-pay | Admitting: Family Medicine

## 2018-03-07 MED ORDER — ALBUTEROL SULFATE (2.5 MG/3ML) 0.083% IN NEBU: 2.5000 mg | INHALATION_SOLUTION | Freq: Four times a day (QID) | RESPIRATORY_TRACT | 1 refills | Status: DC | PRN

## 2018-03-07 NOTE — Telephone Encounter (Signed)
RX faxed to [facility] @ [phone]

## 2018-03-07 NOTE — Telephone Encounter (Signed)
Copied from CRM [id number]. Topic: General - Other >> Mar 07, 2018  2:24 PM Gerrianne Scale wrote: Reason for CRM: pt calling for lab results

## 2018-03-07 NOTE — Telephone Encounter (Signed)
Patient notified

## 2018-03-07 NOTE — Telephone Encounter (Signed)
A letter was mailed 2 days ago, his kidney function normalized and the other results were reviewed in clinic

## 2018-03-07 NOTE — Telephone Encounter (Signed)
Please advise 

## 2018-03-07 NOTE — Telephone Encounter (Signed)
Rx for nebulizer and supplies ready to send to [facility], albuterol solution sent to his local pharmacy  Copied from CRM [id number]. Topic: General - Other >> Mar 07, 2018  2:26 PM Gerrianne Scale wrote: Reason for CRM: pt calling stating that he need his nebulizer machine,hose and albuterol sent to the [facility]  (P) [phone]

## 2018-03-09 NOTE — Telephone Encounter (Signed)
Corey [name] at [facility] called and received RX but needs updated OV notes that state albuterol is being used via nebulizer.  Sates she also needs CMN.  She can be reached at [phone].

## 2018-03-12 NOTE — Telephone Encounter (Signed)
Patient will need OV due to requirements for OV notes. Left message on machine for pt to return call to the office.

## 2018-03-13 DIAGNOSIS — J45909 Unspecified asthma, uncomplicated: Secondary | ICD-10-CM | POA: Diagnosis not present

## 2018-03-13 NOTE — Telephone Encounter (Signed)
Left message on machine for pt to return call to the office.  

## 2018-03-14 ENCOUNTER — Telehealth: Payer: Self-pay | Admitting: Family Medicine

## 2018-03-14 NOTE — Telephone Encounter (Signed)
Left message on machine on both lines for pt to return call to the office.

## 2018-03-15 NOTE — Telephone Encounter (Signed)
Left message on machine for pt to return call to the office. Will mail out letter.

## 2018-03-15 NOTE — Telephone Encounter (Signed)
This is a late entry-TC from patient, yesterday. Requesting refills and appointment information. Referred to PCP and Sheilah Mins, CMA has already addressed with patient via letter.

## 2018-03-16 ENCOUNTER — Telehealth: Payer: Self-pay | Admitting: Family Medicine

## 2018-03-16 NOTE — Telephone Encounter (Signed)
Copied from CRM [id number]. Topic: Quick Communication - See Telephone Encounter >> Mar 16, 2018  2:10 PM Jens Som A wrote: CRM for notification. See Telephone encounter for: 03/16/18.  [employer] is calling to schedule AWV for the patient please advise with the patient

## 2018-03-20 ENCOUNTER — Other Ambulatory Visit: Payer: Self-pay | Admitting: Family Medicine

## 2018-03-21 ENCOUNTER — Telehealth: Payer: Self-pay | Admitting: Family Medicine

## 2018-03-21 DIAGNOSIS — E119 Type 2 diabetes mellitus without complications: Principal | ICD-10-CM

## 2018-03-21 DIAGNOSIS — IMO0001 Reserved for inherently not codable concepts without codable children: Secondary | ICD-10-CM

## 2018-03-21 DIAGNOSIS — Z794 Long term (current) use of insulin: Principal | ICD-10-CM

## 2018-03-21 NOTE — Telephone Encounter (Signed)
New referral placed  Copied from CRM [id number]. Topic: Referral - Request for Referral >> Mar 19, 2018 12:17 PM Mickel Baas B, [place] wrote: Has patient seen PCP for this complaint? Yes.   *If NO, is insurance requiring patient see PCP for this issue before PCP can refer them? Referral for which specialty: Podiatry Preferred provider/office: [facility] Reason for referral: Had previous referral but was unable to schedule at the time. States now the office will not call him back to schedule. Would like a new referral

## 2018-03-23 DIAGNOSIS — J45909 Unspecified asthma, uncomplicated: Secondary | ICD-10-CM | POA: Diagnosis not present

## 2018-04-04 ENCOUNTER — Encounter: Payer: Self-pay | Admitting: Family Medicine

## 2018-04-04 ENCOUNTER — Ambulatory Visit (INDEPENDENT_AMBULATORY_CARE_PROVIDER_SITE_OTHER): Payer: Medicare Other | Admitting: Family Medicine

## 2018-04-04 VITALS — BP 146/80 | HR 97 | Temp 98.3°F | Ht 68.0 in | Wt 230.0 lb

## 2018-04-04 DIAGNOSIS — R05 Cough: Secondary | ICD-10-CM

## 2018-04-04 DIAGNOSIS — IMO0001 Reserved for inherently not codable concepts without codable children: Secondary | ICD-10-CM

## 2018-04-04 DIAGNOSIS — E119 Type 2 diabetes mellitus without complications: Secondary | ICD-10-CM

## 2018-04-04 DIAGNOSIS — Z1211 Encounter for screening for malignant neoplasm of colon: Secondary | ICD-10-CM

## 2018-04-04 DIAGNOSIS — R059 Cough, unspecified: Secondary | ICD-10-CM

## 2018-04-04 DIAGNOSIS — Z794 Long term (current) use of insulin: Secondary | ICD-10-CM | POA: Diagnosis not present

## 2018-04-04 MED ORDER — METFORMIN HCL 1000 MG PO TABS: 1000.0000 mg | ORAL_TABLET | Freq: Two times a day (BID) | ORAL | 1 refills | Status: DC

## 2018-04-04 MED ORDER — CETIRIZINE HCL 10 MG PO TABS: 10.0000 mg | ORAL_TABLET | Freq: Every day | ORAL | 1 refills | Status: DC

## 2018-04-04 NOTE — Progress Notes (Signed)
BP (!) 146/80   Pulse 97   Temp 98.3 F (36.8 C) (Oral)   Ht 5\' 8"  (1.727 m)   Wt 230 lb (104.3 kg)   SpO2 98%   BMI 34.97 kg/m    Subjective:    Patient ID: Corey [name], male    DOB: 06-Jun-1955, 63 y.o.   MRN: [id number]  HPI: Corey [name] is a 63 y.o. male  Chief Complaint  Patient presents with  . Follow-up  . Medication Refill    Metformin & Cetirizine  . Cough    When he coughs alot he gets dizzy and light headed.    Here today requesting refills for metformin and zyrtec. Continues to tolerate ozempic well, states home BS are averaging in the 160s. Still taking 75 units lantus and glyxambi as well. No further hypoglycemic episodes. Continues to work on diet and exercise.    Having a cough that sometimes makes him dizzy. Has had a cold recently and states something has been going around his household pretty consistently the last few months. Does have a known hx of allergies and asthma but compliant with both regimens. Denies fevers, CP, SOB, visual changes, headaches.   Relevant past medical, surgical, family and social history reviewed and updated as indicated. Interim medical history since our last visit reviewed. Allergies and medications reviewed and updated.  Review of Systems  Per HPI unless specifically indicated above     Objective:    BP (!) 146/80   Pulse 97   Temp 98.3 F (36.8 C) (Oral)   Ht 5\' 8"  (1.727 m)   Wt 230 lb (104.3 kg)   SpO2 98%   BMI 34.97 kg/m   Wt Readings from Last 3 Encounters:  04/04/18 230 lb (104.3 kg)  03/02/18 230 lb 4.8 oz (104.5 kg)  01/29/18 222 lb 14.4 oz (101.1 kg)    Physical Exam Vitals signs and nursing note reviewed.  Constitutional:      Appearance: Normal appearance.  HENT:     Head: Atraumatic.     Nose:     Comments: Nasal mucosa boggy and erythematous    Mouth/Throat:     Mouth: Mucous membranes are moist.     Pharynx: Oropharynx is clear.  Eyes:     Extraocular Movements: Extraocular movements  intact.     Conjunctiva/sclera: Conjunctivae normal.  Neck:     Musculoskeletal: Normal range of motion and neck supple.  Cardiovascular:     Rate and Rhythm: Normal rate and regular rhythm.  Pulmonary:     Effort: Pulmonary effort is normal.     Breath sounds: Normal breath sounds. No wheezing or rales.  Musculoskeletal: Normal range of motion.  Skin:    General: Skin is warm and dry.  Neurological:     General: No focal deficit present.     Mental Status: He is oriented to person, place, and time.  Psychiatric:        Mood and Affect: Mood normal.        Thought Content: Thought content normal.        Judgment: Judgment normal.    Results for orders placed or performed in visit on 03/02/18  Basic Metabolic Panel (BMET)  Result Value Ref Range   Glucose 282 (H) 65 - 99 mg/dL   BUN 20 8 - 27 mg/dL   Creatinine, Ser 6.43 0.76 - 1.27 mg/dL   GFR calc non Af Amer 66 >59 mL/min/1.73   GFR calc Af Amer 76 >59  mL/min/1.73   BUN/Creatinine Ratio 17 10 - 24   Sodium 137 134 - 144 mmol/L   Potassium 4.5 3.5 - 5.2 mmol/L   Chloride 97 96 - 106 mmol/L   CO2 20 20 - 29 mmol/L   Calcium 8.9 8.6 - 10.2 mg/dL  Bayer DCA Hb L0R Waived  Result Value Ref Range   HB A1C (BAYER DCA - WAIVED) 9.7 (H) <7.0 %      Assessment & Plan:   Problem List Items Addressed This Visit      Endocrine   Insulin dependent diabetes mellitus (HCC) - Primary    Medications refilled, continue working on lifestyle modifications. WIll recheck A1C in 1 month and refer to Endocrinology given persistent poor control and hypoglycemic episodes several months ago.       Relevant Medications   HUMALOG KWIKPEN 100 UNIT/ML KwikPen   metFORMIN (GLUCOPHAGE) 1000 MG tablet   Other Relevant Orders   Ambulatory referral to Endocrinology    Other Visit Diagnoses    Cough       Exam and vitals benign, will get CXR and consider changing ACEI if no cause noted. Continue asthma and allergy regimen, f/u if worsening    Relevant Orders   DG Chest 2 View (Completed)   Colon cancer screening       Relevant Orders   Ambulatory referral to Gastroenterology       Follow up plan: Return in about 1 month (around 05/05/2018) for DM.

## 2018-04-04 NOTE — Patient Instructions (Addendum)
[facility] - Dr. Alberteen Spindle My Locations   [facility] - Podiatry  [address] [place], [place] [address]  Office: [phone]       Go to [facility] tomorrow while you're over there for your podiatry appointment to get your chest x-ray.  [address] [facility] [address] [place], [place] [address]

## 2018-04-05 ENCOUNTER — Ambulatory Visit (INDEPENDENT_AMBULATORY_CARE_PROVIDER_SITE_OTHER): Payer: Medicare Other | Admitting: Podiatry

## 2018-04-05 ENCOUNTER — Other Ambulatory Visit: Payer: Self-pay

## 2018-04-05 ENCOUNTER — Ambulatory Visit
Admission: RE | Admit: 2018-04-05 | Discharge: 2018-04-05 | Disposition: A | Discharge status: Home / Self Care | Payer: Medicare Other | Source: Ambulatory Visit | Attending: Family Medicine | Admitting: Family Medicine

## 2018-04-05 ENCOUNTER — Telehealth: Payer: Self-pay

## 2018-04-05 ENCOUNTER — Ambulatory Visit
Admission: RE | Admit: 2018-04-05 | Discharge: 2018-04-05 | Disposition: A | Discharge status: Home / Self Care | Payer: Medicare Other | Attending: Family Medicine | Admitting: Family Medicine

## 2018-04-05 ENCOUNTER — Encounter: Payer: Self-pay | Admitting: Podiatry

## 2018-04-05 VITALS — BP 132/78 | HR 92

## 2018-04-05 DIAGNOSIS — R05 Cough: Secondary | ICD-10-CM | POA: Diagnosis not present

## 2018-04-05 DIAGNOSIS — R234 Changes in skin texture: Secondary | ICD-10-CM

## 2018-04-05 DIAGNOSIS — R059 Cough, unspecified: Secondary | ICD-10-CM

## 2018-04-05 DIAGNOSIS — M79675 Pain in left toe(s): Secondary | ICD-10-CM | POA: Diagnosis not present

## 2018-04-05 DIAGNOSIS — M79674 Pain in right toe(s): Secondary | ICD-10-CM

## 2018-04-05 DIAGNOSIS — L851 Acquired keratosis [keratoderma] palmaris et plantaris: Secondary | ICD-10-CM

## 2018-04-05 DIAGNOSIS — E1159 Type 2 diabetes mellitus with other circulatory complications: Secondary | ICD-10-CM | POA: Diagnosis not present

## 2018-04-05 DIAGNOSIS — B351 Tinea unguium: Secondary | ICD-10-CM

## 2018-04-05 DIAGNOSIS — Z1211 Encounter for screening for malignant neoplasm of colon: Secondary | ICD-10-CM

## 2018-04-05 MED ORDER — AMMONIUM LACTATE 12 % EX LOTN: TOPICAL_LOTION | CUTANEOUS | 1 refills | Status: DC

## 2018-04-05 NOTE — Telephone Encounter (Signed)
Gastroenterology Pre-Procedure Review  Request Date: 04/17/18 Requesting Physician: Dr. Allen Norris  PATIENT REVIEW QUESTIONS: The patient responded to the following health history questions as indicated:    1. Are you having any GI issues? no 2. Do you have a personal history of Polyps? yes (4-5 years ago maybe (unsure) [facility]) 3. Do you have a family history of Colon Cancer or Polyps? no 4. Diabetes Mellitus? no 5. Joint replacements in the past 12 months?no 6. Major health problems in the past 3 months?no 7. Any artificial heart valves, MVP, or defibrillator?no    MEDICATIONS & ALLERGIES:    Patient reports the following regarding taking any anticoagulation/antiplatelet therapy:   Plavix, Coumadin, Eliquis, Xarelto, Lovenox, Pradaxa, Brilinta, or Effient? no Aspirin? yes  Patient confirms/reports the following medications:  Current Outpatient Medications  Medication Sig Dispense Refill  . ACCU-CHEK FASTCLIX LANCETS MISC USE TO CHECK BLOOD SUGAR  12  . ACCU-CHEK GUIDE test strip USE TO CHECK BLOOD SUGAR TID 100 each 12  . albuterol (PROVENTIL HFA;VENTOLIN HFA) 108 (90 Base) MCG/ACT inhaler Inhale 2 puffs into the lungs every 6 (six) hours as needed for wheezing or shortness of breath. 3 Inhaler 1  . albuterol (PROVENTIL) (2.5 MG/3ML) 0.083% nebulizer solution Take 3 mLs (2.5 mg total) by nebulization every 6 (six) hours as needed for wheezing or shortness of breath. 150 mL 1  . amitriptyline (ELAVIL) 50 MG tablet Take 1-2 tablets (50-100 mg total) by mouth at bedtime. 180 tablet 1  . ammonium lactate (AMLACTIN) 12 % lotion Apply to affected area twice daily as needed 396 g 1  . aspirin EC 81 MG tablet Take 81 mg by mouth daily.    Marland Kitchen azelastine (ASTELIN) 0.1 % nasal spray Place 2 sprays into both nostrils 2 (two) times daily. Use in each nostril as directed 30 mL 6  . Blood Glucose Monitoring Suppl (ACCU-CHEK GUIDE) w/Device KIT U UTD  0  . cetirizine (ZYRTEC) 10 MG tablet Take 1  tablet (10 mg total) by mouth daily. 90 tablet 1  . Cholecalciferol (VITAMIN D3) 5000 units TABS Take by mouth.    . citalopram (CELEXA) 10 MG tablet Take 1 tablet (10 mg total) by mouth daily. 90 tablet 1  . diclofenac sodium (VOLTAREN) 1 % GEL Apply topically 4 (four) times daily.    . Fluticasone-Salmeterol (ADVAIR) 500-50 MCG/DOSE AEPB Inhale 1 puff into the lungs 2 (two) times daily. 1 each 6  . furosemide (LASIX) 20 MG tablet Take 1 tablet (20 mg total) by mouth 2 (two) times daily. 180 tablet 1  . GLYXAMBI 25-5 MG TABS TAKE 1 TABLET BY MOUTH EVERY MORNING 90 tablet 1  . HUMALOG KWIKPEN 100 UNIT/ML KwikPen INJECT 5 UNITS [place] QAM AND 7 UNITS QPM    . Insulin Pen Needle (PEN NEEDLES) 31G X 5 MM MISC 1 Units by Does not apply route 2 (two) times daily. 100 each 11  . Insulin Syringe-Needle U-100 30G X 1/2" 1 ML MISC 1 Units by Does not apply route daily. 90 each 3  . Lancets Misc. (ACCU-CHEK FASTCLIX LANCET) KIT 1 Units by Does not apply route 2 (two) times daily. 3 kit 3  . LANTUS SOLOSTAR 100 UNIT/ML Solostar Pen INJECT 75 UNITS [place] D. NO FURTHER REFILLS WITHOUT OFFICE VISIT 45 mL 11  . lisinopril (PRINIVIL,ZESTRIL) 10 MG tablet TAKE 1 TABLET(10 MG) BY MOUTH DAILY 90 tablet 1  . magnesium gluconate (MAGONATE) 500 MG tablet Take 500 mg by mouth daily.    Marland Kitchen  meloxicam (MOBIC) 15 MG tablet TAKE 1 TABLET(15 MG) BY MOUTH DAILY 90 tablet 1  . metFORMIN (GLUCOPHAGE) 1000 MG tablet Take 1 tablet (1,000 mg total) by mouth 2 (two) times daily. 180 tablet 1  . montelukast (SINGULAIR) 10 MG tablet TAKE 1 TABLET(10 MG) BY MOUTH DAILY 90 tablet 1  . NEXIUM 40 MG capsule Take 1 capsule (40 mg total) by mouth daily as needed. 90 capsule 1  . pregabalin (LYRICA) 100 MG capsule Take 1 capsule (100 mg total) by mouth 2 (two) times daily. 180 capsule 1  . primidone (MYSOLINE) 50 MG tablet TAKE 2 TABLETS(100 MG) BY MOUTH DAILY 180 tablet 1  . rosuvastatin (CRESTOR) 20 MG tablet Take 1 tablet (20 mg total) by mouth  daily. 90 tablet 1  . Semaglutide,0.25 or 0.5MG/DOS, (OZEMPIC, 0.25 OR 0.5 MG/DOSE,) 2 MG/1.5ML SOPN Inject 0.25 mg into the skin once a week. 1 pen 2   No current facility-administered medications for this visit.     Patient confirms/reports the following allergies:  No Known Allergies  No orders of the defined types were placed in this encounter.   AUTHORIZATION INFORMATION Primary Insurance: 1D#: Group #:  Secondary Insurance: 1D#: Group #:  SCHEDULE INFORMATION: Date: 04/17/18 Time: Location:[facility]

## 2018-04-05 NOTE — Progress Notes (Signed)
This patient presents to the office with chief complaint of long thick nails and diabetic feet. He also says he is presenting for his annual foot exam and painful heel callus.   This patient says there are long thick painful nails.  These nails are painful walking and wearing shoes.  Patient has no history of infection or drainage from both feet.  Patient also has painful callus on both heels which he has tried to work on himself. . This patient presents  to the office today for treatment of the  long nails, calluses  and a foot evaluation due to history of  diabetes.  General Appearance  Alert, conversant and in no acute stress.  Vascular  Dorsalis pedis  Are weakly  palpable  bilaterally. Posterior tibial pulses are absent  B/L Capillary return is within normal limits  bilaterally. Temperature is within normal limits  bilaterally.  Neurologic  Senn-Weinstein monofilament wire test within normal limits  bilaterally. Muscle power within normal limits bilaterally.  Nails Thick disfigured discolored nails with subungual debris  from hallux to fifth toes bilaterally. No evidence of bacterial infection or drainage bilaterally.  Orthopedic  No limitations of motion of motion feet .  No crepitus or effusions noted.  HAV 1st MPJ  B/L.Marland Kitchen  Limited foot ROM due to uarding of his feet.  Skin  normotropic skin with no porokeratosis noted bilaterally.  No signs of infections or ulcers noted.   Keratoderma climacticum  Onychomycosis  Diabetes with no foot complications  IE  Debride nails x 10.  A diabetic foot exam was performed and there is no evidence of  neurologic pathology.  Possible vascular pathology noted.  Prescribe lac-hydrin and told to use a pumice stone.  RTC 1 year   Helane Gunther DPM

## 2018-04-06 ENCOUNTER — Telehealth: Payer: Self-pay | Admitting: Family Medicine

## 2018-04-06 ENCOUNTER — Telehealth: Payer: Self-pay

## 2018-04-06 NOTE — Telephone Encounter (Signed)
Copied from CRM [id number]. Topic: Quick Communication - Lab Results (Clinic Use ONLY) >> Apr 06, 2018  8:45 AM Richarda Overlie, CMA wrote: Called patient to inform them of recent lab results. When patient returns call, triage nurse may disclose results. >> Apr 06, 2018 10:39 AM Marylen Ponto wrote: Pt returned call for lab results. Pt requests call back. Cb# [phone]

## 2018-04-06 NOTE — Telephone Encounter (Signed)
He called in for his lab results.   There was not an interpretation of the results.    Jada with [facility] had called him yesterday. I Skyped a message to the office.   They gave me a number for Mr. Ola to call her at so she could give him the results of his labs.  Pt was agreeable to this.   I gave him the number and he stated he would call now.   I made the practice aware he would be calling.

## 2018-04-06 NOTE — Telephone Encounter (Signed)
Patient LVM to call regarding rescheduling his colonoscopy date.   Call has been returned and he has been rescheduled from 03/17  to 05/22/18 remains with Dr. Servando Snare at [facility].  Raynelle Fanning in Endoscopy notified and referral updated.  Thanks [employer]

## 2018-04-07 NOTE — Assessment & Plan Note (Signed)
Medications refilled, continue working on lifestyle modifications. WIll recheck A1C in 1 month and refer to Endocrinology given persistent poor control and hypoglycemic episodes several months ago.

## 2018-04-09 ENCOUNTER — Telehealth: Payer: Self-pay | Admitting: Family Medicine

## 2018-04-09 NOTE — Telephone Encounter (Signed)
1/31 note addended  Copied from CRM [id number]. Topic: General - Other >> Apr 09, 2018  9:50 AM Wyonia Hough E wrote: Reason for CRM: Raven from [facility] called and stated that she needs the Chart notes from the 1.21.20 visit and amend the chart notes so that they state an order of a nebulizer and albuterol. Until the amend the order can not be approved/ please advise

## 2018-04-09 NOTE — Assessment & Plan Note (Signed)
Rx sent for nebulizer machine and supplies. Use prn in addition to current regimen

## 2018-04-09 NOTE — Telephone Encounter (Signed)
Called [employer] for fax number. Note printed and faxed to them.

## 2018-04-11 DIAGNOSIS — J45909 Unspecified asthma, uncomplicated: Secondary | ICD-10-CM | POA: Diagnosis not present

## 2018-04-16 ENCOUNTER — Other Ambulatory Visit: Payer: Self-pay | Admitting: Family Medicine

## 2018-04-17 DIAGNOSIS — J45909 Unspecified asthma, uncomplicated: Secondary | ICD-10-CM | POA: Diagnosis not present

## 2018-04-22 ENCOUNTER — Other Ambulatory Visit: Payer: Self-pay | Admitting: Family Medicine

## 2018-04-25 ENCOUNTER — Other Ambulatory Visit: Payer: Medicare Other | Admitting: Orthotics

## 2018-04-25 DIAGNOSIS — E1142 Type 2 diabetes mellitus with diabetic polyneuropathy: Secondary | ICD-10-CM | POA: Diagnosis not present

## 2018-04-25 DIAGNOSIS — E1169 Type 2 diabetes mellitus with other specified complication: Secondary | ICD-10-CM | POA: Diagnosis not present

## 2018-04-25 DIAGNOSIS — E1165 Type 2 diabetes mellitus with hyperglycemia: Secondary | ICD-10-CM | POA: Diagnosis not present

## 2018-04-25 DIAGNOSIS — E1159 Type 2 diabetes mellitus with other circulatory complications: Secondary | ICD-10-CM | POA: Diagnosis not present

## 2018-04-30 ENCOUNTER — Other Ambulatory Visit: Payer: Self-pay | Admitting: Family Medicine

## 2018-04-30 ENCOUNTER — Telehealth: Payer: Self-pay | Admitting: Gastroenterology

## 2018-04-30 DIAGNOSIS — E669 Obesity, unspecified: Secondary | ICD-10-CM | POA: Insufficient documentation

## 2018-04-30 DIAGNOSIS — E1165 Type 2 diabetes mellitus with hyperglycemia: Secondary | ICD-10-CM | POA: Insufficient documentation

## 2018-04-30 DIAGNOSIS — F17200 Nicotine dependence, unspecified, uncomplicated: Secondary | ICD-10-CM | POA: Insufficient documentation

## 2018-04-30 DIAGNOSIS — Z794 Long term (current) use of insulin: Secondary | ICD-10-CM | POA: Insufficient documentation

## 2018-04-30 DIAGNOSIS — E1142 Type 2 diabetes mellitus with diabetic polyneuropathy: Secondary | ICD-10-CM | POA: Insufficient documentation

## 2018-04-30 DIAGNOSIS — Z87891 Personal history of nicotine dependence: Secondary | ICD-10-CM | POA: Insufficient documentation

## 2018-04-30 MED ORDER — FLUTICASONE-SALMETEROL 500-50 MCG/DOSE IN AEPB: 1.0000 | INHALATION_SPRAY | Freq: Two times a day (BID) | RESPIRATORY_TRACT | 6 refills | Status: DC

## 2018-04-30 NOTE — Telephone Encounter (Signed)
Copied from CRM [id number]. Topic: Quick Communication - Rx Refill/Question >> Apr 30, 2018  8:41 AM Jay Schlichter wrote: Medication:Fluticasone-Salmeterol (ADVAIR) 500-50 MCG/DOSE AEPB  Has the patient contacted their pharmacy? Yes.   (Agent: If no, request that the patient contact the pharmacy for the refill.) (Agent: If yes, when and what did the pharmacy advise?)  Preferred Pharmacy (with phone number or street name): [facility] [address] [place]   Agent: Please be advised that RX refills may take up to 3 business days. We ask that you follow-up with your pharmacy.

## 2018-04-30 NOTE — Telephone Encounter (Signed)
Rx sent 

## 2018-04-30 NOTE — Telephone Encounter (Signed)
LVM informing patients daughter that her father is still currently on the schedule for 05/22/18 however this is subject to change as the COVID19  Restrictions are reviewed again.  We will notify her if the procedure will need to be canceled as we get updated.  Thanks  [employer]

## 2018-04-30 NOTE — Telephone Encounter (Signed)
Pt is calling to see if he is still scheduled for 05/22/18 for procedure

## 2018-05-01 ENCOUNTER — Ambulatory Visit: Payer: Medicare Other | Admitting: Family Medicine

## 2018-05-02 ENCOUNTER — Other Ambulatory Visit: Payer: Self-pay | Admitting: Family Medicine

## 2018-05-02 NOTE — Telephone Encounter (Signed)
Please advise on refill request

## 2018-05-09 ENCOUNTER — Telehealth: Payer: Self-pay

## 2018-05-09 NOTE — Telephone Encounter (Signed)
LVM with Crystal, notifying patient that his colonoscopy has been canceled for 04/21 due to COVID 19 and requested that she calls the office back to reschedule for June or July.  Thanks [employer]

## 2018-05-11 DIAGNOSIS — J45909 Unspecified asthma, uncomplicated: Secondary | ICD-10-CM | POA: Diagnosis not present

## 2018-05-12 DIAGNOSIS — J45909 Unspecified asthma, uncomplicated: Secondary | ICD-10-CM | POA: Diagnosis not present

## 2018-05-15 ENCOUNTER — Telehealth: Payer: Self-pay | Admitting: Gastroenterology

## 2018-05-15 ENCOUNTER — Telehealth: Payer: Self-pay

## 2018-05-15 NOTE — Telephone Encounter (Signed)
Received a letter from [facility]. Nexium is not covered by the patient's insurance. Alternatives are listed below:  Lansoprazole- tier 2 Omeprazole- tier 2 Pantoprazole- tier 1  Please advise.

## 2018-05-15 NOTE — Telephone Encounter (Signed)
Patient called to cancel & r/s his colonoscopy with Dr Servando Snare.

## 2018-05-16 MED ORDER — PANTOPRAZOLE SODIUM 40 MG PO TBEC: 40.0000 mg | DELAYED_RELEASE_TABLET | Freq: Every day | ORAL | 3 refills | Status: DC

## 2018-05-16 NOTE — Telephone Encounter (Signed)
Protonix sent d/t coverage

## 2018-05-16 NOTE — Telephone Encounter (Signed)
LVM with patients daughter Aggie Cosier returning call to reschedule fathers colonoscopy with Dr. Servando Snare.  Thanks [employer]

## 2018-05-22 ENCOUNTER — Encounter: Admission: RE | Payer: Self-pay | Source: Ambulatory Visit

## 2018-05-22 ENCOUNTER — Telehealth: Payer: Self-pay | Admitting: Family Medicine

## 2018-05-22 ENCOUNTER — Ambulatory Visit: Admission: RE | Admit: 2018-05-22 | Payer: Medicare Other | Source: Ambulatory Visit | Admitting: Gastroenterology

## 2018-05-22 DIAGNOSIS — J4540 Moderate persistent asthma, uncomplicated: Secondary | ICD-10-CM

## 2018-05-22 SURGERY — COLONOSCOPY WITH PROPOFOL
Anesthesia: General

## 2018-05-22 NOTE — Telephone Encounter (Signed)
Would need to see Pulmonology to discuss this particular medication. I am happy to place a referral if he would like.   Copied from CRM [id number]. Topic: General - Other >> May 21, 2018  4:15 PM Jaquita Rector A wrote: Reason for CRM: Patient called to say that he saw a new Asthma medication on TV and would like to be prescribed this medication. Medication is called DUPIXENT. Ph# [phone]

## 2018-05-22 NOTE — Telephone Encounter (Signed)
Called and left a message letting patient know what Fleet Contras said, asked patient to let us know if he would like the referral or not.

## 2018-05-29 NOTE — Telephone Encounter (Signed)
Routing to provider for referral

## 2018-05-29 NOTE — Telephone Encounter (Signed)
Patient says he wants to move forward with referral to pulmonology and also missed a call today but no note in chart. He would like a call to notify him once the referral is placed.

## 2018-05-31 DIAGNOSIS — E1165 Type 2 diabetes mellitus with hyperglycemia: Secondary | ICD-10-CM | POA: Diagnosis not present

## 2018-05-31 DIAGNOSIS — Z794 Long term (current) use of insulin: Secondary | ICD-10-CM | POA: Diagnosis not present

## 2018-05-31 NOTE — Telephone Encounter (Signed)
Referral placed, please let him know per his request

## 2018-05-31 NOTE — Telephone Encounter (Signed)
Left message for patient to call back  

## 2018-05-31 NOTE — Addendum Note (Signed)
Addended by: Roosvelt Maser E on: 05/31/2018 05:41 AM   Modules accepted: Orders

## 2018-06-05 DIAGNOSIS — J45909 Unspecified asthma, uncomplicated: Secondary | ICD-10-CM | POA: Diagnosis not present

## 2018-06-06 ENCOUNTER — Ambulatory Visit: Payer: Medicare Other | Admitting: Orthotics

## 2018-06-06 ENCOUNTER — Other Ambulatory Visit: Payer: Self-pay

## 2018-06-06 DIAGNOSIS — E119 Type 2 diabetes mellitus without complications: Secondary | ICD-10-CM

## 2018-06-06 DIAGNOSIS — Z794 Long term (current) use of insulin: Secondary | ICD-10-CM

## 2018-06-06 DIAGNOSIS — E1159 Type 2 diabetes mellitus with other circulatory complications: Secondary | ICD-10-CM

## 2018-06-06 DIAGNOSIS — IMO0001 Reserved for inherently not codable concepts without codable children: Secondary | ICD-10-CM

## 2018-06-06 NOTE — Progress Notes (Signed)
Being seen by PA as PCP and NP as managing diabetes....told patient to make an appointment with endo doc.

## 2018-06-07 DIAGNOSIS — E1142 Type 2 diabetes mellitus with diabetic polyneuropathy: Secondary | ICD-10-CM | POA: Diagnosis not present

## 2018-06-07 DIAGNOSIS — E1159 Type 2 diabetes mellitus with other circulatory complications: Secondary | ICD-10-CM | POA: Diagnosis not present

## 2018-06-07 DIAGNOSIS — E1169 Type 2 diabetes mellitus with other specified complication: Secondary | ICD-10-CM | POA: Diagnosis not present

## 2018-06-07 DIAGNOSIS — E1165 Type 2 diabetes mellitus with hyperglycemia: Secondary | ICD-10-CM | POA: Diagnosis not present

## 2018-06-11 DIAGNOSIS — J45909 Unspecified asthma, uncomplicated: Secondary | ICD-10-CM | POA: Diagnosis not present

## 2018-06-11 DIAGNOSIS — J4530 Mild persistent asthma, uncomplicated: Secondary | ICD-10-CM | POA: Diagnosis not present

## 2018-06-11 DIAGNOSIS — R0602 Shortness of breath: Secondary | ICD-10-CM | POA: Diagnosis not present

## 2018-06-13 ENCOUNTER — Telehealth: Payer: Self-pay | Admitting: Family Medicine

## 2018-06-13 ENCOUNTER — Institutional Professional Consult (permissible substitution): Payer: Medicare Other | Admitting: Internal Medicine

## 2018-06-13 NOTE — Telephone Encounter (Signed)
Called pt to see if the last progress note would be ok, no answer, left voicemail.

## 2018-06-13 NOTE — Telephone Encounter (Signed)
OK to generate letter for this purpose with number of OVs in the past 12 months (or whatever information they needed for this purpose)

## 2018-06-13 NOTE — Telephone Encounter (Signed)
Copied from CRM [id number]. Topic: Quick Communication - See Telephone Encounter >> Jun 13, 2018  3:12 PM Terisa Starr wrote: CRM for notification. See Telephone encounter for: 06/13/18.  Patient said he went to get his diabetic shoes and they advised him that they needed something that the provider seen him at least once a year. Please Advise.

## 2018-06-15 DIAGNOSIS — J45909 Unspecified asthma, uncomplicated: Secondary | ICD-10-CM | POA: Diagnosis not present

## 2018-06-20 ENCOUNTER — Other Ambulatory Visit: Payer: Self-pay | Admitting: Family Medicine

## 2018-06-21 ENCOUNTER — Other Ambulatory Visit: Payer: Self-pay

## 2018-06-21 ENCOUNTER — Encounter: Payer: Self-pay | Admitting: Family Medicine

## 2018-06-21 ENCOUNTER — Ambulatory Visit (INDEPENDENT_AMBULATORY_CARE_PROVIDER_SITE_OTHER): Payer: Medicare Other | Admitting: Family Medicine

## 2018-06-21 VITALS — BP 132/78 | HR 94 | Temp 98.4°F

## 2018-06-21 DIAGNOSIS — E119 Type 2 diabetes mellitus without complications: Secondary | ICD-10-CM | POA: Diagnosis not present

## 2018-06-21 DIAGNOSIS — J4540 Moderate persistent asthma, uncomplicated: Secondary | ICD-10-CM | POA: Diagnosis not present

## 2018-06-21 DIAGNOSIS — IMO0001 Reserved for inherently not codable concepts without codable children: Secondary | ICD-10-CM

## 2018-06-21 DIAGNOSIS — Z794 Long term (current) use of insulin: Secondary | ICD-10-CM

## 2018-06-21 DIAGNOSIS — I10 Essential (primary) hypertension: Secondary | ICD-10-CM | POA: Diagnosis not present

## 2018-06-21 MED ORDER — NEXIUM 40 MG PO CPDR: 40.0000 mg | DELAYED_RELEASE_CAPSULE | Freq: Every day | ORAL | 1 refills | Status: DC | PRN

## 2018-06-21 NOTE — Progress Notes (Signed)
BP 132/78   Pulse 94   Temp 98.4 F (36.9 C) (Oral)   SpO2 96%    Subjective:    Patient ID: Corey [name], male    DOB: December 07, 1955, 63 y.o.   MRN: [id number]  HPI: Corey [name] is a 63 y.o. male  Chief Complaint  Patient presents with  . Depression  . Diabetes  . Hypertension   Home BSs have been 120s - 200 depending on what he eats. Now following with Endocrinology for diabetes management.   Blood pressures stable when checked, typically 120s-130s/70s. Taking his medicines faithfully without side effects.   Following now with Pulmonology about his asthma. Awaiting spirometry testing and medication modifications.   No new concerns today.   Relevant past medical, surgical, family and social history reviewed and updated as indicated. Interim medical history since our last visit reviewed. Allergies and medications reviewed and updated.  Review of Systems  Per HPI unless specifically indicated above     Objective:    BP 132/78   Pulse 94   Temp 98.4 F (36.9 C) (Oral)   SpO2 96%   Wt Readings from Last 3 Encounters:  04/04/18 230 lb (104.3 kg)  03/02/18 230 lb 4.8 oz (104.5 kg)  01/29/18 222 lb 14.4 oz (101.1 kg)    Physical Exam Vitals signs and nursing note reviewed.  Constitutional:      Appearance: Normal appearance.  HENT:     Head: Atraumatic.  Eyes:     Extraocular Movements: Extraocular movements intact.     Conjunctiva/sclera: Conjunctivae normal.  Neck:     Musculoskeletal: Normal range of motion and neck supple.  Cardiovascular:     Rate and Rhythm: Normal rate and regular rhythm.  Pulmonary:     Effort: Pulmonary effort is normal.     Breath sounds: Normal breath sounds.  Musculoskeletal: Normal range of motion.  Skin:    General: Skin is warm and dry.  Neurological:     General: No focal deficit present.     Mental Status: He is oriented to person, place, and time.  Psychiatric:        Mood and Affect: Mood normal.        Thought  Content: Thought content normal.        Judgment: Judgment normal.     Results for orders placed or performed in visit on 03/02/18  Basic Metabolic Panel (BMET)  Result Value Ref Range   Glucose 282 (H) 65 - 99 mg/dL   BUN 20 8 - 27 mg/dL   Creatinine, Ser 8.291.19 0.76 - 1.27 mg/dL   GFR calc non Af Amer 66 >59 mL/min/1.73   GFR calc Af Amer 76 >59 mL/min/1.73   BUN/Creatinine Ratio 17 10 - 24   Sodium 137 134 - 144 mmol/L   Potassium 4.5 3.5 - 5.2 mmol/L   Chloride 97 96 - 106 mmol/L   CO2 20 20 - 29 mmol/L   Calcium 8.9 8.6 - 10.2 mg/dL  Bayer DCA Hb F6OA1c Waived  Result Value Ref Range   HB A1C (BAYER DCA - WAIVED) 9.7 (H) <7.0 %      Assessment & Plan:   Problem List Items Addressed This Visit      Cardiovascular and Mediastinum   Essential hypertension    BPs stable and WNL, continue current regimen        Respiratory   Asthma    Now followed by Pulmonology, continue per their recommendations  Endocrine   Insulin dependent diabetes mellitus (HCC) - Primary    Now followed by Endocrinology. Continue per their recommendations          Follow up plan: Return in about 6 months (around 12/22/2018) for CPE.

## 2018-06-26 DIAGNOSIS — R0602 Shortness of breath: Secondary | ICD-10-CM | POA: Diagnosis not present

## 2018-06-26 NOTE — Assessment & Plan Note (Signed)
BPs stable and WNL, continue current regimen 

## 2018-06-26 NOTE — Assessment & Plan Note (Signed)
Now followed by Pulmonology, continue per their recommendations

## 2018-06-26 NOTE — Telephone Encounter (Signed)
Patient states that he needs a letter from Dr. Dossie Arbour, my supervising Physician, authorizing his diabetic shoes

## 2018-06-26 NOTE — Assessment & Plan Note (Signed)
Now followed by Endocrinology. Continue per their recommendations

## 2018-06-26 NOTE — Telephone Encounter (Signed)
To this patient have any documented foot problems?  I did not see any on a casual look.

## 2018-06-27 ENCOUNTER — Other Ambulatory Visit: Payer: Self-pay | Admitting: Family Medicine

## 2018-06-27 NOTE — Telephone Encounter (Signed)
Per podiatry has painful calluses on b/l feet and does have insulin dependent DM

## 2018-06-29 DIAGNOSIS — J45909 Unspecified asthma, uncomplicated: Secondary | ICD-10-CM | POA: Diagnosis not present

## 2018-06-29 DIAGNOSIS — J449 Chronic obstructive pulmonary disease, unspecified: Secondary | ICD-10-CM | POA: Diagnosis not present

## 2018-07-02 DIAGNOSIS — R931 Abnormal findings on diagnostic imaging of heart and coronary circulation: Secondary | ICD-10-CM | POA: Diagnosis not present

## 2018-07-02 DIAGNOSIS — R0602 Shortness of breath: Secondary | ICD-10-CM | POA: Diagnosis not present

## 2018-07-02 DIAGNOSIS — J449 Chronic obstructive pulmonary disease, unspecified: Secondary | ICD-10-CM | POA: Diagnosis not present

## 2018-07-09 ENCOUNTER — Telehealth: Payer: Self-pay | Admitting: Family Medicine

## 2018-07-09 NOTE — Telephone Encounter (Signed)
Completely fine  Copied from [place] [id number]. Topic: General - Inquiry >> Jul 09, 2018  3:50 PM Virl Axe D wrote: Reason for CRM: Corey [name] with Cardiology at [facility] called to make sure it is okay for pt to see cardiologist. Pt scheduled on 07/13/18 with Dr. Clayborn Bigness. They like to check with the PCP when a specialist refers a pt to another specialist. Please advise. Cb#[phone]

## 2018-07-10 NOTE — Telephone Encounter (Signed)
Pt and provider notified

## 2018-07-12 DIAGNOSIS — J45909 Unspecified asthma, uncomplicated: Secondary | ICD-10-CM | POA: Diagnosis not present

## 2018-07-15 ENCOUNTER — Other Ambulatory Visit: Payer: Self-pay | Admitting: Family Medicine

## 2018-07-18 DIAGNOSIS — E1165 Type 2 diabetes mellitus with hyperglycemia: Secondary | ICD-10-CM | POA: Diagnosis not present

## 2018-07-18 DIAGNOSIS — I200 Unstable angina: Secondary | ICD-10-CM | POA: Diagnosis not present

## 2018-07-18 DIAGNOSIS — E1159 Type 2 diabetes mellitus with other circulatory complications: Secondary | ICD-10-CM | POA: Diagnosis not present

## 2018-07-18 DIAGNOSIS — R0602 Shortness of breath: Secondary | ICD-10-CM | POA: Diagnosis not present

## 2018-07-18 DIAGNOSIS — E1169 Type 2 diabetes mellitus with other specified complication: Secondary | ICD-10-CM | POA: Diagnosis not present

## 2018-07-20 ENCOUNTER — Other Ambulatory Visit
Admission: RE | Admit: 2018-07-20 | Discharge: 2018-07-20 | Disposition: A | Discharge status: Home / Self Care | Payer: Medicare Other | Source: Ambulatory Visit | Attending: Internal Medicine | Admitting: Internal Medicine

## 2018-07-20 ENCOUNTER — Other Ambulatory Visit: Payer: Self-pay

## 2018-07-20 DIAGNOSIS — Z1159 Encounter for screening for other viral diseases: Secondary | ICD-10-CM | POA: Diagnosis not present

## 2018-07-21 ENCOUNTER — Other Ambulatory Visit: Payer: Self-pay | Admitting: Family Medicine

## 2018-07-21 LAB — NOVEL CORONAVIRUS, NAA (HOSP ORDER, SEND-OUT TO REF LAB; TAT 18-24 HRS): SARS-CoV-2, NAA: NOT DETECTED

## 2018-07-22 ENCOUNTER — Other Ambulatory Visit: Payer: Self-pay | Admitting: Family Medicine

## 2018-07-23 NOTE — Telephone Encounter (Signed)
Please advise 

## 2018-07-24 DIAGNOSIS — J45909 Unspecified asthma, uncomplicated: Secondary | ICD-10-CM | POA: Diagnosis not present

## 2018-07-25 ENCOUNTER — Ambulatory Visit
Admission: RE | Admit: 2018-07-25 | Discharge: 2018-07-25 | Disposition: A | Discharge status: Home / Self Care | Payer: Medicare Other | Attending: Internal Medicine | Admitting: Internal Medicine

## 2018-07-25 ENCOUNTER — Encounter
Admission: RE | Disposition: A | Discharge status: Home / Self Care | Payer: Self-pay | Source: Home / Self Care | Attending: Internal Medicine

## 2018-07-25 ENCOUNTER — Other Ambulatory Visit: Payer: Self-pay

## 2018-07-25 DIAGNOSIS — R0602 Shortness of breath: Secondary | ICD-10-CM | POA: Diagnosis not present

## 2018-07-25 DIAGNOSIS — I25110 Atherosclerotic heart disease of native coronary artery with unstable angina pectoris: Secondary | ICD-10-CM | POA: Diagnosis not present

## 2018-07-25 DIAGNOSIS — E1140 Type 2 diabetes mellitus with diabetic neuropathy, unspecified: Secondary | ICD-10-CM | POA: Insufficient documentation

## 2018-07-25 DIAGNOSIS — Z794 Long term (current) use of insulin: Secondary | ICD-10-CM | POA: Diagnosis not present

## 2018-07-25 DIAGNOSIS — Z79899 Other long term (current) drug therapy: Secondary | ICD-10-CM | POA: Diagnosis not present

## 2018-07-25 DIAGNOSIS — E785 Hyperlipidemia, unspecified: Secondary | ICD-10-CM | POA: Diagnosis not present

## 2018-07-25 DIAGNOSIS — E669 Obesity, unspecified: Secondary | ICD-10-CM | POA: Diagnosis not present

## 2018-07-25 DIAGNOSIS — Z1159 Encounter for screening for other viral diseases: Secondary | ICD-10-CM | POA: Insufficient documentation

## 2018-07-25 DIAGNOSIS — Z7982 Long term (current) use of aspirin: Secondary | ICD-10-CM | POA: Diagnosis not present

## 2018-07-25 DIAGNOSIS — Z6833 Body mass index (BMI) 33.0-33.9, adult: Secondary | ICD-10-CM | POA: Diagnosis not present

## 2018-07-25 DIAGNOSIS — I10 Essential (primary) hypertension: Secondary | ICD-10-CM | POA: Diagnosis not present

## 2018-07-25 DIAGNOSIS — I5032 Chronic diastolic (congestive) heart failure: Secondary | ICD-10-CM | POA: Diagnosis not present

## 2018-07-25 DIAGNOSIS — Z87891 Personal history of nicotine dependence: Secondary | ICD-10-CM | POA: Diagnosis not present

## 2018-07-25 DIAGNOSIS — J449 Chronic obstructive pulmonary disease, unspecified: Secondary | ICD-10-CM | POA: Diagnosis not present

## 2018-07-25 DIAGNOSIS — I208 Other forms of angina pectoris: Secondary | ICD-10-CM | POA: Diagnosis not present

## 2018-07-25 HISTORY — PX: RIGHT/LEFT HEART CATH AND CORONARY ANGIOGRAPHY: CATH118266

## 2018-07-25 LAB — GLUCOSE, CAPILLARY: Glucose-Capillary: 108 mg/dL — ABNORMAL HIGH (ref 70–99)

## 2018-07-25 SURGERY — LEFT HEART CATH AND CORONARY ANGIOGRAPHY
Anesthesia: Moderate Sedation

## 2018-07-25 SURGERY — RIGHT/LEFT HEART CATH AND CORONARY ANGIOGRAPHY
Anesthesia: Moderate Sedation

## 2018-07-25 MED ORDER — SODIUM CHLORIDE 0.9% FLUSH: 3.0000 mL | Freq: Two times a day (BID) | INTRAVENOUS | Status: DC

## 2018-07-25 MED ORDER — SODIUM CHLORIDE 0.9% FLUSH: 3.0000 mL | INTRAVENOUS | Status: DC | PRN

## 2018-07-25 MED ORDER — SODIUM CHLORIDE 0.9 % WEIGHT BASED INFUSION: 1.0000 mL/kg/h | INTRAVENOUS | Status: DC

## 2018-07-25 MED ORDER — ONDANSETRON HCL 4 MG/2ML IJ SOLN: 4.0000 mg | Freq: Four times a day (QID) | INTRAMUSCULAR | Status: DC | PRN

## 2018-07-25 MED ORDER — LANTUS SOLOSTAR 100 UNIT/ML ~~LOC~~ SOPN: PEN_INJECTOR | SUBCUTANEOUS | 11 refills | Status: DC

## 2018-07-25 MED ORDER — IOHEXOL 300 MG/ML  SOLN
INTRAMUSCULAR | Status: DC | PRN
  Administered 2018-07-25: 100 mL via INTRA_ARTERIAL

## 2018-07-25 MED ORDER — PANTOPRAZOLE SODIUM 40 MG PO TBEC: 40.0000 mg | DELAYED_RELEASE_TABLET | Freq: Every day | ORAL | 3 refills | Status: DC

## 2018-07-25 MED ORDER — SODIUM CHLORIDE 0.9 % WEIGHT BASED INFUSION
3.0000 mL/kg/h | INTRAVENOUS | Status: AC
  Administered 2018-07-25: 3 mL/kg/h via INTRAVENOUS

## 2018-07-25 MED ORDER — AZELASTINE HCL 0.1 % NA SOLN: 2.0000 | Freq: Two times a day (BID) | NASAL | 6 refills | Status: DC

## 2018-07-25 MED ORDER — ACETAMINOPHEN 325 MG PO TABS: 650.0000 mg | ORAL_TABLET | ORAL | Status: DC | PRN

## 2018-07-25 MED ORDER — AMITRIPTYLINE HCL 50 MG PO TABS: 50.0000 mg | ORAL_TABLET | Freq: Every day | ORAL | 1 refills | Status: DC

## 2018-07-25 MED ORDER — MIDAZOLAM HCL 2 MG/2ML IJ SOLN
INTRAMUSCULAR | Status: AC
  Filled 2018-07-25: qty 2

## 2018-07-25 MED ORDER — ASPIRIN 81 MG PO CHEW: 81.0000 mg | CHEWABLE_TABLET | ORAL | Status: DC

## 2018-07-25 MED ORDER — HEPARIN (PORCINE) IN NACL 1000-0.9 UT/500ML-% IV SOLN
INTRAVENOUS | Status: DC | PRN
  Administered 2018-07-25: 500 mL

## 2018-07-25 MED ORDER — LABETALOL HCL 5 MG/ML IV SOLN: 10.0000 mg | INTRAVENOUS | Status: DC | PRN

## 2018-07-25 MED ORDER — SODIUM CHLORIDE 0.9 % IV SOLN: 250.0000 mL | INTRAVENOUS | Status: DC | PRN

## 2018-07-25 MED ORDER — FENTANYL CITRATE (PF) 100 MCG/2ML IJ SOLN
INTRAMUSCULAR | Status: AC
  Filled 2018-07-25: qty 2

## 2018-07-25 MED ORDER — MIDAZOLAM HCL 2 MG/2ML IJ SOLN
INTRAMUSCULAR | Status: DC | PRN
  Administered 2018-07-25: 1 mg via INTRAVENOUS

## 2018-07-25 MED ORDER — HEPARIN (PORCINE) IN NACL 1000-0.9 UT/500ML-% IV SOLN
INTRAVENOUS | Status: AC
  Filled 2018-07-25: qty 1000

## 2018-07-25 MED ORDER — FENTANYL CITRATE (PF) 100 MCG/2ML IJ SOLN
INTRAMUSCULAR | Status: DC | PRN
  Administered 2018-07-25 (×2): 25 ug via INTRAVENOUS

## 2018-07-25 MED ORDER — HYDRALAZINE HCL 20 MG/ML IJ SOLN: 10.0000 mg | INTRAMUSCULAR | Status: DC | PRN

## 2018-07-25 SURGICAL SUPPLY — 15 items
CATH INFINITI 5FR ANG PIGTAIL (CATHETERS) ×4 IMPLANT
CATH INFINITI 5FR JL4 (CATHETERS) ×4 IMPLANT
CATH INFINITI JR4 5F (CATHETERS) ×4 IMPLANT
CATH SWANZ 7F THERMO (CATHETERS) ×4 IMPLANT
DEVICE CLOSURE MYNXGRIP 5F (Vascular Products) ×4 IMPLANT
GLIDESHEATH SLEND SS 6F .021 (SHEATH) IMPLANT
GUIDEWIRE EMER 3M J .025X150CM (WIRE) ×4 IMPLANT
KIT MANI 3VAL PERCEP (MISCELLANEOUS) ×4 IMPLANT
KIT RIGHT HEART (MISCELLANEOUS) ×4 IMPLANT
NEEDLE PERC 18GX7CM (NEEDLE) ×4 IMPLANT
PACK CARDIAC CATH (CUSTOM PROCEDURE TRAY) ×4 IMPLANT
SHEATH AVANTI 5FR X 11CM (SHEATH) ×4 IMPLANT
SHEATH AVANTI 7FRX11 (SHEATH) ×4 IMPLANT
WIRE GUIDERIGHT .035X150 (WIRE) ×4 IMPLANT
WIRE ROSEN-J .035X260CM (WIRE) IMPLANT

## 2018-07-25 NOTE — Discharge Instructions (Signed)
Angiogram, Care After °This sheet gives you information about how to care for yourself after your procedure. Your doctor may also give you more specific instructions. If you have problems or questions, contact your doctor. °Follow these instructions at home: °Insertion site care °· Follow instructions from your doctor about how to take care of your long, thin tube (catheter) insertion area. Make sure you: °? Wash your hands with soap and water before you change your bandage (dressing). If you cannot use soap and water, use hand sanitizer. °? Change your bandage as told by your doctor. °? Leave stitches (sutures), skin glue, or skin tape (adhesive) strips in place. They may need to stay in place for 2 weeks or longer. If tape strips get loose and curl up, you may trim the loose edges. Do not remove tape strips completely unless your doctor says it is okay. °· Do not take baths, swim, or use a hot tub until your doctor says it is okay. °· You may shower 24-48 hours after the procedure or as told by your doctor. °? Gently wash the area with plain soap and water. °? Pat the area dry with a clean towel. °? Do not rub the area. This may cause bleeding. °· Do not apply powder or lotion to the area. Keep the area clean and dry. °· Check your insertion area every day for signs of infection. Check for: °? More redness, swelling, or pain. °? Fluid or blood. °? Warmth. °? Pus or a bad smell. °Activity °· Rest as told by your doctor, usually for 1-2 days. °· Do not lift anything that is heavier than 10 lbs. (4.5 kg) or as told by your doctor. °· Do not drive for 24 hours if you were given a medicine to help you relax (sedative). °· Do not drive or use heavy machinery while taking prescription pain medicine. °General instructions ° °· Go back to your normal activities as told by your doctor, usually in about a week. Ask your doctor what activities are safe for you. °· If the insertion area starts to bleed, lie flat and put  pressure on the area. If the bleeding does not stop, get help right away. This is an emergency. °· Drink enough fluid to keep your pee (urine) clear or pale yellow. °· Take over-the-counter and prescription medicines only as told by your doctor. °· Keep all follow-up visits as told by your doctor. This is important. °Contact a doctor if: °· You have a fever. °· You have chills. °· You have more redness, swelling, or pain around your insertion area. °· You have fluid or blood coming from your insertion area. °· The insertion area feels warm to the touch. °· You have pus or a bad smell coming from your insertion area. °· You have more bruising around the insertion area. °· Blood collects in the tissue around the insertion area (hematoma) that may be painful to the touch. °Get help right away if: °· You have a lot of pain in the insertion area. °· The insertion area swells very fast. °· The insertion area is bleeding, and the bleeding does not stop after holding steady pressure on the area. °· The area near or just beyond the insertion area becomes pale, cool, tingly, or numb. °These symptoms may be an emergency. Do not wait to see if the symptoms will go away. Get medical help right away. Call your local emergency services (911 in the [place] Do not drive yourself to the hospital. °  Summary  After the procedure, it is common to have bruising and tenderness at the long, thin tube insertion area.  After the procedure, it is important to rest and drink plenty of fluids.  Do not take baths, swim, or use a hot tub until your doctor says it is okay to do so. You may shower 24-48 hours after the procedure or as told by your doctor.  If the insertion area starts to bleed, lie flat and put pressure on the area. If the bleeding does not stop, get help right away. This is an emergency. This information is not intended to replace advice given to you by your health care provider. Make sure you discuss any questions you have  with your health care provider. Document Released: 04/15/2008 Document Revised: 01/12/2016 Document Reviewed: 01/12/2016 Elsevier Interactive Patient Education  2019 Elsevier Inc.    Coronary Artery Disease, Male  Coronary artery disease (CAD) is a condition in which the arteries that lead to the heart (coronary arteries) become narrow or blocked. The narrowing or blockage can lead to decreased blood flow to the heart. Prolonged reduced blood flow can cause a heart attack (myocardial infarction or MI). This condition may also be called coronary heart disease. Because CAD is the leading cause of death in men, it is important to understand what causes this condition and how it is treated. What are the causes? CAD is most often caused by atherosclerosis. This is the buildup of fat and cholesterol (plaque) on the inside of the arteries. Over time, the plaque may narrow or block the artery, reducing blood flow to the heart. Plaque can also become weak and break off within a coronary artery and cause a sudden blockage. Other less common causes of CAD include:  An embolism or blood clot in a coronary artery.  A tearing of the artery (spontaneous coronary artery dissection).  An aneurysm.  Inflammation (vasculitis) in the artery wall. What increases the risk? The following factors may make you more likely to develop this condition:  Age. Men over age 61 are at a greater risk of CAD.  Family history of CAD.  Gender. Men often develop CAD earlier in life than women.  High blood pressure (hypertension).  Diabetes.  High cholesterol levels.  Tobacco use.  Excessive alcohol use.  Lack of exercise.  A diet high in saturated and trans fats, such as fried food and processed meat. Other possible risk factors include:  High stress levels.  Depression.  Obesity.  Sleep apnea. What are the signs or symptoms? Many people do not have any symptoms during the early stages of CAD. As the  condition progresses, symptoms may include:  Chest pain (angina). The pain can: ? Feel like a crushing or squeezing, or a tightness, pressure, fullness, or heaviness in the chest. ? Last more than a few minutes or can stop and recur. The pain tends to get worse with exercise or stress and to fade with rest.  Pain in the arms, neck, jaw, or back.  Unexplained heartburn or indigestion.  Shortness of breath.  Nausea or vomiting.  Sudden light-headedness.  Sudden cold sweats.  Fluttering or fast heartbeat (palpitations). How is this diagnosed? This condition is diagnosed based on:  Your family and medical history.  A physical exam.  Tests, including: ? A test to check the electrical signals in your heart (electrocardiogram). ? Exercise stress test. This looks for signs of blockage when the heart is stressed with exercise, such as running on a  treadmill. ? Pharmacologic stress test. This test looks for signs of blockage when the heart is being stressed with a medicine. ? Blood tests. ? Coronary angiogram. This is a procedure to look at the coronary arteries to see if there is any blockage. During this test, a dye is injected into your arteries so they appear on an X-ray. ? A test that uses sound waves to take a picture of your heart (echocardiogram). ? Chest X-ray. How is this treated? This condition may be treated by:  Healthy lifestyle changes to reduce risk factors.  Medicines such as: ? Antiplatelet medicines and blood-thinning medicines, such as aspirin. These help to prevent blood clots. ? Nitroglycerin. ? Blood pressure medicines. ? Cholesterol-lowering medicine.  Coronary angioplasty and stenting. During this procedure, a thin, flexible tube is inserted through a blood vessel and into a blocked artery. A balloon or similar device on the end of the tube is inflated to open up the artery. In some cases, a small, mesh tube (stent) is inserted into the artery to keep it  open.  Coronary artery bypass surgery. During this surgery, veins or arteries from other parts of the body are used to create a bypass around the blockage and allow blood to reach your heart. Follow these instructions at home: Medicines  Take over-the-counter and prescription medicines only as told by your health care provider.  Do not take the following medicines unless your health care provider approves: ? NSAIDs, such as ibuprofen, naproxen, or celecoxib. ? Vitamin supplements that contain vitamin A, vitamin E, or both. Lifestyle  Follow an exercise program approved by your health care provider. Aim for 150 minutes of moderate exercise or 75 minutes of vigorous exercise each week.  Maintain a healthy weight or lose weight as approved by your health care provider.  Rest when you are tired.  Learn to manage stress or try to limit your stress. Ask your health care provider for suggestions if you need help.  Get screened for depression and seek treatment, if needed.  Do not use any products that contain nicotine or tobacco, such as cigarettes and e-cigarettes. If you need help quitting, ask your health care provider.  Do not use illegal drugs. Eating and drinking  Follow a heart-healthy diet. A dietitian can help educate you about healthy food options and changes. In general, eat plenty of fruits and vegetables, lean meats, and whole grains.  Avoid foods high in: ? Sugar. ? Salt (sodium). ? Saturated fat, such as processed or fatty meat. ? Trans fat, such as fried foods.  Use healthy cooking methods such as roasting, grilling, broiling, baking, poaching, steaming, or stir-frying.  If you drink alcohol, and your health care provider approves, limit your alcohol intake to no more than 2 drinks per day. One drink equals 12 ounces of beer, 5 ounces of wine, or 1 ounces of hard liquor. General instructions  Manage any other health conditions, such as hypertension and diabetes.  These conditions affect your heart.  Your health care provider may ask you to monitor your blood pressure. Ideally, your blood pressure should be below 130/80.  Keep all follow-up visits as told by your health care provider. This is important. Get help right away if:  You have pain in your chest, neck, arm, jaw, stomach, or back that: ? Lasts more than a few minutes. ? Is recurring. ? Is not relieved by taking medicine under your tongue (sublingualnitroglycerin).  You have too much (profuse) sweating without cause.  You  have unexplained: ? Heartburn or indigestion. ? Shortness of breath or difficulty breathing. ? Fluttering or fast heartbeat (palpitations). ? Nausea or vomiting. ? Fatigue. ? Feelings of nervousness or anxiety. ? Weakness. ? Diarrhea.  You have sudden light-headedness or dizziness.  You faint.  You feel like hurting yourself or think about taking your own life. These symptoms may represent a serious problem that is an emergency. Do not wait to see if the symptoms will go away. Get medical help right away. Call your local emergency services (911 in the [place] Do not drive yourself to the hospital. Summary  Coronary artery disease (CAD) is a process in which the arteries that lead to the heart (coronary arteries) become narrow or blocked. The narrowing or blockage can lead to a heart attack.  Many people do not have any symptoms during the early stages of CAD. This is called "silent CAD."  CAD can be treated with lifestyle changes, medicines, surgery, or a combination of these treatments. This information is not intended to replace advice given to you by your health care provider. Make sure you discuss any questions you have with your health care provider. Document Released: 08/14/2013 Document Revised: 01/08/2016 Document Reviewed: 01/08/2016 Elsevier Interactive Patient Education  2019 [employer].    Moderate Conscious Sedation, Adult, Care After These  instructions provide you with information about caring for yourself after your procedure. Your health care provider may also give you more specific instructions. Your treatment has been planned according to current medical practices, but problems sometimes occur. Call your health care provider if you have any problems or questions after your procedure. What can I expect after the procedure? After your procedure, it is common:  To feel sleepy for several hours.  To feel clumsy and have poor balance for several hours.  To have poor judgment for several hours.  To vomit if you eat too soon. Follow these instructions at home: For at least 24 hours after the procedure:   Do not: ? Participate in activities where you could fall or become injured. ? Drive. ? Use heavy machinery. ? Drink alcohol. ? Take sleeping pills or medicines that cause drowsiness. ? Make important decisions or sign legal documents. ? Take care of children on your own.  Rest. Eating and drinking  Follow the diet recommended by your health care provider.  If you vomit: ? Drink water, juice, or soup when you can drink without vomiting. ? Make sure you have little or no nausea before eating solid foods. General instructions  Have a responsible adult stay with you until you are awake and alert.  Take over-the-counter and prescription medicines only as told by your health care provider.  If you smoke, do not smoke without supervision.  Keep all follow-up visits as told by your health care provider. This is important. Contact a health care provider if:  You keep feeling nauseous or you keep vomiting.  You feel light-headed.  You develop a rash.  You have a fever. Get help right away if:  You have trouble breathing. This information is not intended to replace advice given to you by your health care provider. Make sure you discuss any questions you have with your health care provider. Document Released:  11/07/2012 Document Revised: 06/22/2015 Document Reviewed: 05/09/2015 Elsevier Interactive Patient Education  2019 [employer].

## 2018-07-26 ENCOUNTER — Encounter: Payer: Self-pay | Admitting: Internal Medicine

## 2018-07-30 ENCOUNTER — Telehealth: Payer: Self-pay | Admitting: Family Medicine

## 2018-07-30 NOTE — Telephone Encounter (Signed)
I believe we are waiting on an authorization letter from my supervising Physician. Re-opened that telephone encounter and routed reminder message to Dr. Jeananne Rama. Please let him know we are looking into it  Copied from [place] [phone]. Topic: General - Call Back - No Documentation >> Jul 30, 2018  1:25 PM Erick Blinks wrote: Reason for CRM: Pt is requesting diabetic shoes per discussion during last visit. Please advise  [phone]

## 2018-07-30 NOTE — Telephone Encounter (Signed)
Were you able to create a letter for this patient's diabetic shoe authorization? He is calling again asking for an update as he has been unable to get the shoes

## 2018-07-31 NOTE — Telephone Encounter (Signed)
  What do I need to sign?  

## 2018-07-31 NOTE — Telephone Encounter (Signed)
Pt advised that we are looking into getting this resolved for him and we will call him back once it has been completed. Forwarding to Dr Jeananne Rama.

## 2018-07-31 NOTE — Telephone Encounter (Signed)
Spoke with Apolonio Schneiders. Will generate letter for patient's diabetic shoes from Dr. Jeananne Rama.  Letter needs to be from Dr. Jeananne Rama due to patient's insurance.

## 2018-07-31 NOTE — Telephone Encounter (Signed)
Letter generated.  Need to know where to fax.

## 2018-07-31 NOTE — Telephone Encounter (Signed)
Called patient LVM to know where to fax/send diabetic shoes letter to.

## 2018-07-31 NOTE — Telephone Encounter (Signed)
Letter printed and generated for Diabetic Shoes.  Called patient and need to know where to fax/send letter to.  LVM for patient to return call.

## 2018-08-01 NOTE — Telephone Encounter (Signed)
Spoke with patient and daughter. They stated they were not at home right now and would call back and let us know.

## 2018-08-02 DIAGNOSIS — R0602 Shortness of breath: Secondary | ICD-10-CM | POA: Diagnosis not present

## 2018-08-02 DIAGNOSIS — E1159 Type 2 diabetes mellitus with other circulatory complications: Secondary | ICD-10-CM | POA: Diagnosis not present

## 2018-08-02 DIAGNOSIS — I2510 Atherosclerotic heart disease of native coronary artery without angina pectoris: Secondary | ICD-10-CM | POA: Diagnosis not present

## 2018-08-02 DIAGNOSIS — I200 Unstable angina: Secondary | ICD-10-CM | POA: Diagnosis not present

## 2018-08-02 DIAGNOSIS — E1165 Type 2 diabetes mellitus with hyperglycemia: Secondary | ICD-10-CM | POA: Diagnosis not present

## 2018-08-02 NOTE — Telephone Encounter (Signed)
Did you speak to the patient and daughter yesterday?

## 2018-08-06 NOTE — Telephone Encounter (Signed)
No, I haven't spoken with this pt.

## 2018-08-07 NOTE — Telephone Encounter (Signed)
Patient received letter in the mail as well. Going to [facility].  Has appt on the 15th at 10:00.   Will fax letter to them as well.

## 2018-08-07 NOTE — Telephone Encounter (Signed)
Called [facility]. We're both [facility],should be able to pull letter if needed. Spoke with receptionist, she saw the letter in chart and he's picking up his shoes at his appt. Stated insurance wanted a letter to from PCP. It's in the chart if insurance needs that as well for coverage.  Receptionist understood and has letter if insurance needs it.

## 2018-08-11 DIAGNOSIS — J45909 Unspecified asthma, uncomplicated: Secondary | ICD-10-CM | POA: Diagnosis not present

## 2018-08-15 ENCOUNTER — Other Ambulatory Visit: Payer: Medicare Other | Admitting: Orthotics

## 2018-08-15 ENCOUNTER — Telehealth: Payer: Self-pay | Admitting: Family Medicine

## 2018-08-15 NOTE — Telephone Encounter (Signed)
Please call patient and let him know to discontinue his nexium as there is an interaction with his plavix

## 2018-08-16 DIAGNOSIS — I10 Essential (primary) hypertension: Secondary | ICD-10-CM | POA: Diagnosis not present

## 2018-08-16 DIAGNOSIS — I25118 Atherosclerotic heart disease of native coronary artery with other forms of angina pectoris: Secondary | ICD-10-CM | POA: Insufficient documentation

## 2018-08-16 DIAGNOSIS — E119 Type 2 diabetes mellitus without complications: Secondary | ICD-10-CM | POA: Diagnosis not present

## 2018-08-16 DIAGNOSIS — E785 Hyperlipidemia, unspecified: Secondary | ICD-10-CM | POA: Diagnosis not present

## 2018-08-16 NOTE — Telephone Encounter (Signed)
Can take over the counter pepcid, TUMs or rolaids as needed.

## 2018-08-16 NOTE — Telephone Encounter (Signed)
Left message on machine for pt to return call to the office.  

## 2018-08-16 NOTE — Telephone Encounter (Signed)
Message relayed to patient. Verbalized understanding. Pt asking if there is an alterative medication for his GERD

## 2018-08-16 NOTE — Telephone Encounter (Signed)
Patient returning call best # [phone]

## 2018-08-17 ENCOUNTER — Other Ambulatory Visit: Payer: Self-pay | Admitting: Family Medicine

## 2018-08-17 MED ORDER — SUCRALFATE 1 G PO TABS: 1.0000 g | ORAL_TABLET | Freq: Three times a day (TID) | ORAL | 0 refills | Status: DC | PRN

## 2018-08-17 NOTE — Addendum Note (Signed)
Addended by: Merrie Roof E on: 08/17/2018 03:58 PM   Modules accepted: Orders

## 2018-08-17 NOTE — Telephone Encounter (Signed)
He will try the carafate.

## 2018-08-17 NOTE — Telephone Encounter (Signed)
Pt called back. He said that the pepcid, rolaids, or tums do not help.  Is there something else he can take?

## 2018-08-17 NOTE — Telephone Encounter (Signed)
Can try carafate prn (sent in) or I can refer him to GI to see if they have any alternatives for him. The entire drug class of nexium interacts with his medicine

## 2018-08-17 NOTE — Telephone Encounter (Signed)
Routing to provider  

## 2018-08-20 ENCOUNTER — Telehealth: Payer: Self-pay | Admitting: Family Medicine

## 2018-08-20 NOTE — Telephone Encounter (Signed)
Patient has already been notified to take carafate and let us know how that does. I'm not sure if this call was before or after that conversation but please let him know that nexium, prilosec, and all those related medications can increase his bleeding risk with the plavix which is why we took him off  Copied from [place] [phone]. Topic: General - Other >> Aug 17, 2018  4:36 PM Parke Poisson wrote: Reason for CRM: Pt states that he was advised by office to quit taking Nexium because it interferes with his plavix.He wants to know if omeprazole can be sent to [place], [place] [phone] (Phone) [phone] (Fax)

## 2018-08-20 NOTE — Telephone Encounter (Signed)
Message relayed to patient. Verbalized understanding and denied questions.   

## 2018-08-24 DIAGNOSIS — J45909 Unspecified asthma, uncomplicated: Secondary | ICD-10-CM | POA: Diagnosis not present

## 2018-08-26 ENCOUNTER — Other Ambulatory Visit: Payer: Self-pay | Admitting: Family Medicine

## 2018-08-26 NOTE — Telephone Encounter (Signed)
Requested medication (s) are due for refill today: yes  Requested medication (s) are on the active medication list: yes  Last refill:  02/20/18  Future visit scheduled: yes  Notes to clinic:  Medication not delegated to NT to refill   Requested Prescriptions  Pending Prescriptions Disp Refills   primidone (MYSOLINE) 50 MG tablet [Pharmacy Med Name: PRIMIDONE 50MG  TABLETS] 180 tablet 1    Sig: TAKE 2 TABLETS(100 MG) BY MOUTH DAILY     Not Delegated - Neurology:  Anticonvulsants Failed - 08/26/2018  3:33 AM      Failed - This refill cannot be delegated      Failed - HCT in normal range and within 360 days    Hematocrit  Date Value Ref Range Status  07/25/2017 49.6 37.5 - 51.0 % Final         Failed - HGB in normal range and within 360 days    Hemoglobin  Date Value Ref Range Status  07/25/2017 16.2 13.0 - 17.7 g/dL Final         Failed - PLT in normal range and within 360 days    Platelets  Date Value Ref Range Status  07/25/2017 197 150 - 450 x10E3/uL Final         Failed - WBC in normal range and within 360 days    WBC  Date Value Ref Range Status  07/25/2017 10.7 3.4 - 10.8 x10E3/uL Final         Passed - Valid encounter within last 12 months    Recent Outpatient Visits          2 months ago Insulin dependent diabetes mellitus [facility]   [facility] American, PA-C   4 months ago Insulin dependent diabetes mellitus [facility])   [place], [place], [place]   5 months ago Insulin dependent diabetes mellitus [facility])   [facility] Volney American, [place]   6 months ago Essential hypertension   [place], [place], [place]   10 months ago Insulin dependent diabetes mellitus [facility])   [place], Lilia Argue, [place]      Future Appointments            In 2 months  [facility], [place]   In 4 months [place], Lilia Argue, PA-C [employer], [place]

## 2018-08-29 ENCOUNTER — Ambulatory Visit (INDEPENDENT_AMBULATORY_CARE_PROVIDER_SITE_OTHER): Payer: Medicare Other | Admitting: Family Medicine

## 2018-08-29 ENCOUNTER — Encounter: Payer: Self-pay | Admitting: Family Medicine

## 2018-08-29 ENCOUNTER — Telehealth: Payer: Self-pay | Admitting: Family Medicine

## 2018-08-29 ENCOUNTER — Other Ambulatory Visit: Payer: Self-pay

## 2018-08-29 ENCOUNTER — Encounter: Payer: Self-pay | Admitting: *Deleted

## 2018-08-29 ENCOUNTER — Other Ambulatory Visit: Payer: Medicare Other | Admitting: Orthotics

## 2018-08-29 ENCOUNTER — Encounter: Payer: Medicare Other | Attending: Internal Medicine | Admitting: *Deleted

## 2018-08-29 DIAGNOSIS — Z794 Long term (current) use of insulin: Secondary | ICD-10-CM | POA: Diagnosis not present

## 2018-08-29 DIAGNOSIS — Z955 Presence of coronary angioplasty implant and graft: Secondary | ICD-10-CM

## 2018-08-29 DIAGNOSIS — I10 Essential (primary) hypertension: Secondary | ICD-10-CM

## 2018-08-29 DIAGNOSIS — E119 Type 2 diabetes mellitus without complications: Secondary | ICD-10-CM | POA: Diagnosis not present

## 2018-08-29 DIAGNOSIS — E782 Mixed hyperlipidemia: Secondary | ICD-10-CM | POA: Diagnosis not present

## 2018-08-29 DIAGNOSIS — IMO0001 Reserved for inherently not codable concepts without codable children: Secondary | ICD-10-CM

## 2018-08-29 NOTE — Assessment & Plan Note (Signed)
The current medical regimen is effective;  continue present plan and medications.  

## 2018-08-29 NOTE — Telephone Encounter (Signed)
Scheduled with Dr Jeananne Rama today

## 2018-08-29 NOTE — Telephone Encounter (Signed)
OK to schedule this appt with Dr. Jeananne Rama if patient wanting to do that  Copied from [place] [phone]. Topic: Quick Communication - See Telephone Encounter >> Aug 28, 2018  4:15 PM [name] wrote: CRM for notification. See Telephone encounter for: 08/28/18. Diabetic shoes.  Re CRM # [id number] This pt had received a letter from Dr Jeananne Rama via Merrie Roof for diabetic shoes to be paid by medicare. According to pt they will not accept the letter from [facility] unless Jeananne Rama sees patient  not Merrie Roof. Please advise pt.

## 2018-08-29 NOTE — Assessment & Plan Note (Signed)
Followed by endocrinology will assess for diabetic shoes

## 2018-08-29 NOTE — Progress Notes (Signed)
Virtual Orientation completed today   Appt on Tues 8/4 for Gym orientation and EP/RD Eval.   Email for [facility] was sent today.

## 2018-08-29 NOTE — Progress Notes (Signed)
   There were no vitals taken for this visit.   Subjective:    Patient ID: Corey [name], male    DOB: 26-Nov-1955, 63 y.o.   MRN: [id number]  HPI: Corey [name] is a 63 y.o. male  Med check Patient requesting diabetic shoes reviewed chart patient states his has some heel cracking but does not complain of other diabetic type foot issues.  Diabetes is followed by endocrinology through [facility].  Discussed further that patient will need diabetic shoes exam and care issues through his diabetes doctors to fully decide. Patient no complaints of chest pain chest tightness no cardiac issues. Taking cholesterol medicine without problems. States breathing is doing okay.  Relevant past medical, surgical, family and social history reviewed and updated as indicated. Interim medical history since our last visit reviewed. Allergies and medications reviewed and updated.  Review of Systems  Constitutional: Negative.   Respiratory: Negative.   Cardiovascular: Negative.     Per HPI unless specifically indicated above     Objective:    There were no vitals taken for this visit.  Wt Readings from Last 3 Encounters:  07/25/18 219 lb (99.3 kg)  04/04/18 230 lb (104.3 kg)  03/02/18 230 lb 4.8 oz (104.5 kg)    Physical Exam  Results for orders placed or performed during the hospital encounter of 07/25/18  Glucose, capillary  Result Value Ref Range   Glucose-Capillary 108 (H) 70 - 99 mg/dL      Assessment & Plan:   Problem List Items Addressed This Visit      Cardiovascular and Mediastinum   Essential hypertension    The current medical regimen is effective;  continue present plan and medications.         Endocrine   Insulin dependent diabetes mellitus ([place])    Followed by endocrinology will assess for diabetic shoes        Other   Hyperlipidemia    The current medical regimen is effective;  continue present plan and medications.         Telemedicine using audio/video  telecommunications for a synchronous communication visit. Today's visit due to COVID-19 isolation precautions I connected with and verified that I am speaking with the correct person using two identifiers.   I discussed the limitations, risks, security and privacy concerns of performing an evaluation and management service by telecommunication and the availability of in person appointments. I also discussed with the patient that there may be a patient responsible charge related to this service. The patient expressed understanding and agreed to proceed. The patient's location is home. I am at home.   I discussed the assessment and treatment plan with the patient. The patient was provided an opportunity to ask questions and all were answered. The patient agreed with the plan and demonstrated an understanding of the instructions.   The patient was advised to call back or seek an in-person evaluation if the symptoms worsen or if the condition fails to improve as anticipated.   I provided 21+ minutes of time during this encounter.  Follow up plan: Return if symptoms worsen or fail to improve.

## 2018-09-04 ENCOUNTER — Encounter: Payer: Medicare Other | Attending: Internal Medicine

## 2018-09-04 ENCOUNTER — Other Ambulatory Visit: Payer: Self-pay

## 2018-09-04 VITALS — Ht 68.0 in | Wt 217.5 lb

## 2018-09-04 DIAGNOSIS — Z79899 Other long term (current) drug therapy: Secondary | ICD-10-CM | POA: Diagnosis not present

## 2018-09-04 DIAGNOSIS — Z794 Long term (current) use of insulin: Secondary | ICD-10-CM | POA: Diagnosis not present

## 2018-09-04 DIAGNOSIS — I10 Essential (primary) hypertension: Secondary | ICD-10-CM | POA: Insufficient documentation

## 2018-09-04 DIAGNOSIS — E785 Hyperlipidemia, unspecified: Secondary | ICD-10-CM | POA: Insufficient documentation

## 2018-09-04 DIAGNOSIS — J45909 Unspecified asthma, uncomplicated: Secondary | ICD-10-CM | POA: Insufficient documentation

## 2018-09-04 DIAGNOSIS — E119 Type 2 diabetes mellitus without complications: Secondary | ICD-10-CM | POA: Diagnosis not present

## 2018-09-04 DIAGNOSIS — Z87891 Personal history of nicotine dependence: Secondary | ICD-10-CM | POA: Insufficient documentation

## 2018-09-04 DIAGNOSIS — Z7982 Long term (current) use of aspirin: Secondary | ICD-10-CM | POA: Diagnosis not present

## 2018-09-04 DIAGNOSIS — Z955 Presence of coronary angioplasty implant and graft: Secondary | ICD-10-CM | POA: Insufficient documentation

## 2018-09-04 NOTE — Progress Notes (Signed)
Cardiac Individual Treatment Plan  Patient Details  Name: Corey [name] MRN: [id number] Date of Birth: Feb 22, 1955 Referring Provider:     Cardiac Rehab from 09/04/2018 in [facility] Cardiac and Pulmonary Rehab  Referring Provider  [facility]      Initial Encounter Date:    Cardiac Rehab from 09/04/2018 in [facility] Cardiac and Pulmonary Rehab  Date  09/04/18      Visit Diagnosis: Status post coronary artery stent placement  Patient's Home Medications on Admission:  Current Outpatient Medications:  .  ACCU-CHEK FASTCLIX LANCETS MISC, USE TO CHECK BLOOD SUGAR, Disp: , Rfl: 12 .  ACCU-CHEK GUIDE test strip, USE TO CHECK BLOOD SUGAR TID, Disp: 100 each, Rfl: 12 .  acetaminophen (TYLENOL) 500 MG tablet, Take 1,000 mg by mouth every 6 (six) hours as needed for moderate pain or headache., Disp: , Rfl:  .  albuterol (PROVENTIL) (2.5 MG/3ML) 0.083% nebulizer solution, Take 3 mLs (2.5 mg total) by nebulization every 6 (six) hours as needed for wheezing or shortness of breath., Disp: 150 mL, Rfl: 1 .  amitriptyline (ELAVIL) 50 MG tablet, Take 1-2 tablets (50-100 mg total) by mouth at bedtime., Disp: 180 tablet, Rfl: 1 .  ammonium lactate (AMLACTIN) 12 % lotion, Apply to affected area twice daily as needed (Patient taking differently: Apply 1 application topically daily. ), Disp: 396 g, Rfl: 1 .  aspirin EC 81 MG tablet, Take 81 mg by mouth daily., Disp: , Rfl:  .  azelastine (ASTELIN) 0.1 % nasal spray, Place 2 sprays into both nostrils 2 (two) times daily. Use in each nostril as directed, Disp: 30 mL, Rfl: 6 .  B-D UF III MINI PEN NEEDLES 31G X 5 MM MISC, USE TWICE DAILY, Disp: 100 each, Rfl: 11 .  Blood Glucose Monitoring Suppl (ACCU-CHEK GUIDE) w/Device KIT, U UTD, Disp: , Rfl: 0 .  cetirizine (ZYRTEC) 10 MG tablet, Take 1 tablet (10 mg total) by mouth daily., Disp: 90 tablet, Rfl: 1 .  Cholecalciferol (VITAMIN D3) 5000 units TABS, Take 5,000 Units by mouth daily. , Disp: , Rfl:  .  citalopram (CELEXA) 10  MG tablet, TAKE 1 TABLET(10 MG) BY MOUTH DAILY (Patient taking differently: Take 10 mg by mouth daily. ), Disp: 90 tablet, Rfl: 1 .  diclofenac sodium (VOLTAREN) 1 % GEL, Apply 1 application topically 4 (four) times daily as needed (pain). , Disp: , Rfl:  .  Fluticasone-Salmeterol (ADVAIR) 500-50 MCG/DOSE AEPB, Inhale 1 puff into the lungs 2 (two) times daily., Disp: 1 each, Rfl: 6 .  furosemide (LASIX) 20 MG tablet, TAKE 1 TABLET(20 MG) BY MOUTH TWICE DAILY, Disp: 180 tablet, Rfl: 1 .  GLYXAMBI 25-5 MG TABS, TAKE 1 TABLET BY MOUTH EVERY MORNING, Disp: 90 tablet, Rfl: 1 .  Insulin Syringe-Needle U-100 30G X 1/2" 1 ML MISC, 1 Units by Does not apply route daily., Disp: 90 each, Rfl: 3 .  Lancets Misc. (ACCU-CHEK FASTCLIX LANCET) KIT, 1 Units by Does not apply route 2 (two) times daily., Disp: 3 kit, Rfl: 3 .  LANTUS SOLOSTAR 100 UNIT/ML Solostar Pen, INJECT 75 UNITS [place] D., Disp: 45 mL, Rfl: 11 .  lisinopril (ZESTRIL) 10 MG tablet, TAKE 1 TABLET(10 MG) BY MOUTH DAILY (Patient taking differently: Take 10 mg by mouth daily. ), Disp: 90 tablet, Rfl: 1 .  magnesium gluconate (MAGONATE) 500 MG tablet, Take 500 mg by mouth daily., Disp: , Rfl:  .  meloxicam (MOBIC) 15 MG tablet, Take 1 tablet (15 mg total) by mouth daily as  needed for pain. TAKE 1 TABLET(15 MG) BY MOUTH DAILY, Disp: 90 tablet, Rfl: 0 .  metFORMIN (GLUCOPHAGE) 1000 MG tablet, Take 1 tablet (1,000 mg total) by mouth 2 (two) times daily., Disp: 180 tablet, Rfl: 1 .  montelukast (SINGULAIR) 10 MG tablet, TAKE 1 TABLET(10 MG) BY MOUTH DAILY (Patient taking differently: Take 10 mg by mouth daily. ), Disp: 90 tablet, Rfl: 1 .  OZEMPIC, 0.25 OR 0.5 MG/DOSE, 2 MG/1.5ML SOPN, INJECT 0.25 MG UNDER THE SKIN ONCE PER WEEK (Patient not taking: Reported on 08/29/2018), Disp: 1.5 mL, Rfl: 3 .  OZEMPIC, 1 MG/DOSE, 2 MG/1.5ML SOPN, Inject 1 mg as directed every Friday., Disp: , Rfl:  .  pantoprazole (PROTONIX) 40 MG tablet, Take 1 tablet (40 mg total) by mouth  daily., Disp: 30 tablet, Rfl: 3 .  pregabalin (LYRICA) 100 MG capsule, TAKE 1 CAPSULE(100 MG) BY MOUTH TWICE DAILY, Disp: 180 capsule, Rfl: 1 .  primidone (MYSOLINE) 50 MG tablet, TAKE 2 TABLETS(100 MG) BY MOUTH DAILY, Disp: 180 tablet, Rfl: 1 .  PROAIR HFA 108 (90 Base) MCG/ACT inhaler, INHALE 2 PUFFS INTO THE LUNGS EVERY 6 HOURS AS NEEDED FOR WHEEZING OR SHORTNESS OF BREATH (Patient taking differently: Inhale 2 puffs into the lungs every 6 (six) hours as needed for wheezing or shortness of breath. ), Disp: 3 Inhaler, Rfl: 1 .  rosuvastatin (CRESTOR) 20 MG tablet, TAKE 1 TABLET(20 MG) BY MOUTH DAILY (Patient taking differently: Take 20 mg by mouth at bedtime. ), Disp: 90 tablet, Rfl: 1 .  sucralfate (CARAFATE) 1 g tablet, Take 1 tablet (1 g total) by mouth 3 (three) times daily as needed., Disp: 90 tablet, Rfl: 0  Past Medical History: Past Medical History:  Diagnosis Date  . Asthma   . Diabetes ([place])   . Hyperlipidemia   . Hypertension   . Legg-Perthes disease     Tobacco Use: Social History   Tobacco Use  Smoking Status Former Smoker  . Packs/day: 0.50  . Types: Cigarettes  Smokeless Tobacco Current User  . Types: Chew  Tobacco Comment   quit 40 years ago     Labs: Recent Review Flowsheet Data    Labs for ITP Cardiac and Pulmonary Rehab Latest Ref Rng & Units 04/21/2017 07/25/2017 10/26/2017 01/29/2018 03/02/2018   Cholestrol <200 mg/dL - 167 - 157 -   LDLCALC 0 - 99 mg/dL - 73 - - -   HDL >39 mg/dL - 38(L) - - -   Trlycerides <150 mg/dL - 282(H) - 292(H) -   Hemoglobin A1c <7.0 % 7.1(H) 9.2(H) 9.0(H) 8.8(H) 9.7(H)       Exercise Target Goals: Exercise Program Goal: Individual exercise prescription set using results from initial 6 min walk test and THRR while considering  patient's activity barriers and safety.   Exercise Prescription Goal: Initial exercise prescription builds to 30-45 minutes a day of aerobic activity, 2-3 days per week.  Home exercise guidelines will  be given to patient during program as part of exercise prescription that the participant will acknowledge.  Activity Barriers & Risk Stratification: Activity Barriers & Cardiac Risk Stratification - 08/29/18 1214      Activity Barriers & Cardiac Risk Stratification   Activity Barriers  None    Cardiac Risk Stratification  Moderate       6 Minute Walk: 6 Minute Walk    Row Name 09/04/18 1540         6 Minute Walk   Phase  Initial     Distance  1342 feet     Walk Time  6 minutes     # of Rest Breaks  0     MPH  2.54     METS  3.52     RPE  11     Perceived Dyspnea   0     VO2 Peak  12.33     Resting HR  82 bpm     Resting BP  114/64     Max Ex. HR  128 bpm     Max Ex. BP  142/70     2 Minute Post BP  130/70        Oxygen Initial Assessment:   Oxygen Re-Evaluation:   Oxygen Discharge (Final Oxygen Re-Evaluation):   Initial Exercise Prescription: Initial Exercise Prescription - 09/04/18 1500      Date of Initial Exercise RX and Referring Provider   Date  09/04/18    Referring Provider  [facility]      Treadmill   MPH  2.2    Grade  1    Minutes  15    METs  3      NuStep   Level  2    SPM  80    Minutes  15    METs  3      Arm Ergometer   Level  1    Watts  10    Minutes  15    METs  2      Biostep-RELP   Level  2    SPM  50    Minutes  15    METs  2      Prescription Details   Frequency (times per week)  3    Duration  Progress to 30 minutes of continuous aerobic without signs/symptoms of physical distress      Intensity   THRR 40-80% of Max Heartrate  112-143    Ratings of Perceived Exertion  11-15    Perceived Dyspnea  0-4      Progression   Progression  Continue progressive overload as per policy without signs/symptoms or physical distress.      Resistance Training   Training Prescription  Yes    Weight  3    Reps  10-15       Perform Capillary Blood Glucose checks as needed.  Exercise Prescription Changes: Exercise  Prescription Changes    Row Name 09/04/18 1500             Response to Exercise   Blood Pressure (Admit)  114/64       Blood Pressure (Exercise)  142/70       Blood Pressure (Exit)  130/70       Heart Rate (Admit)  82 bpm       Heart Rate (Exercise)  128 bpm       Heart Rate (Exit)  80 bpm       Rating of Perceived Exertion (Exercise)  11       Perceived Dyspnea (Exercise)  0       Symptoms  no       Duration  Progress to 30 minutes of  aerobic without signs/symptoms of physical distress       Intensity  THRR unchanged         Progression   Progression  Continue to progress workloads to maintain intensity without signs/symptoms of physical distress.          Exercise Comments:   Exercise Goals and  Review: Exercise Goals    Row Name 09/04/18 1546             Exercise Goals   Increase Physical Activity  Yes       Intervention  Provide advice, education, support and counseling about physical activity/exercise needs.;Develop an individualized exercise prescription for aerobic and resistive training based on initial evaluation findings, risk stratification, comorbidities and participant's personal goals.       Expected Outcomes  Short Term: Attend rehab on a regular basis to increase amount of physical activity.;Long Term: Add in home exercise to make exercise part of routine and to increase amount of physical activity.;Long Term: Exercising regularly at least 3-5 days a week.       Increase Strength and Stamina  Yes       Intervention  Provide advice, education, support and counseling about physical activity/exercise needs.;Develop an individualized exercise prescription for aerobic and resistive training based on initial evaluation findings, risk stratification, comorbidities and participant's personal goals.       Expected Outcomes  Short Term: Increase workloads from initial exercise prescription for resistance, speed, and METs.;Short Term: Perform resistance training  exercises routinely during rehab and add in resistance training at home;Long Term: Improve cardiorespiratory fitness, muscular endurance and strength as measured by increased METs and functional capacity (6MWT)       Able to understand and use rate of perceived exertion (RPE) scale  Yes       Intervention  Provide education and explanation on how to use RPE scale       Expected Outcomes  Short Term: Able to use RPE daily in rehab to express subjective intensity level;Long Term:  Able to use RPE to guide intensity level when exercising independently       Able to understand and use Dyspnea scale  Yes       Intervention  Provide education and explanation on how to use Dyspnea scale       Expected Outcomes  Short Term: Able to use Dyspnea scale daily in rehab to express subjective sense of shortness of breath during exertion;Long Term: Able to use Dyspnea scale to guide intensity level when exercising independently       Knowledge and understanding of Target Heart Rate Range (THRR)  Yes       Intervention  Provide education and explanation of THRR including how the numbers were predicted and where they are located for reference       Expected Outcomes  Short Term: Able to state/look up THRR;Long Term: Able to use THRR to govern intensity when exercising independently;Short Term: Able to use daily as guideline for intensity in rehab       Able to check pulse independently  Yes       Intervention  Provide education and demonstration on how to check pulse in carotid and radial arteries.;Review the importance of being able to check your own pulse for safety during independent exercise       Expected Outcomes  Short Term: Able to explain why pulse checking is important during independent exercise;Long Term: Able to check pulse independently and accurately       Understanding of Exercise Prescription  Yes       Intervention  Provide education, explanation, and written materials on patient's individual exercise  prescription       Expected Outcomes  Short Term: Able to explain program exercise prescription;Long Term: Able to explain home exercise prescription to exercise independently  Exercise Goals Re-Evaluation :   Discharge Exercise Prescription (Final Exercise Prescription Changes): Exercise Prescription Changes - 09/04/18 1500      Response to Exercise   Blood Pressure (Admit)  114/64    Blood Pressure (Exercise)  142/70    Blood Pressure (Exit)  130/70    Heart Rate (Admit)  82 bpm    Heart Rate (Exercise)  128 bpm    Heart Rate (Exit)  80 bpm    Rating of Perceived Exertion (Exercise)  11    Perceived Dyspnea (Exercise)  0    Symptoms  no    Duration  Progress to 30 minutes of  aerobic without signs/symptoms of physical distress    Intensity  THRR unchanged      Progression   Progression  Continue to progress workloads to maintain intensity without signs/symptoms of physical distress.       Nutrition:  Target Goals: Understanding of nutrition guidelines, daily intake of sodium '1500mg'$ , cholesterol '200mg'$ , calories 30% from fat and 7% or less from saturated fats, daily to have 5 or more servings of fruits and vegetables.  Biometrics: Pre Biometrics - 09/04/18 1542      Pre Biometrics   Height  '5\' 8"'$  (1.727 m)    Weight  217 lb 8 oz (98.7 kg)    BMI (Calculated)  33.08    Single Leg Stand  14.86 seconds        Nutrition Therapy Plan and Nutrition Goals: Nutrition Therapy & Goals - 09/04/18 1548      Nutrition Therapy   Diet  Low Na, HH, DM diet    Protein (specify units)  80g       Nutrition Assessments: Nutrition Assessments - 08/30/18 0724      MEDFICTS Scores   Pre Score  29       Nutrition Goals Re-Evaluation:   Nutrition Goals Discharge (Final Nutrition Goals Re-Evaluation):   Psychosocial: Target Goals: Acknowledge presence or absence of significant depression and/or stress, maximize coping skills, provide positive support system.  Participant is able to verbalize types and ability to use techniques and skills needed for reducing stress and depression.   Initial Review & Psychosocial Screening: Initial Psych Review & Screening - 08/29/18 1214      Initial Review   Current issues with  None Identified      Family Dynamics   Good Support System?  Yes   Girl friend and daughter, and friends     Barriers   Psychosocial barriers to participate in program  There are no identifiable barriers or psychosocial needs.;The patient should benefit from training in stress management and relaxation.      Screening Interventions   Interventions  Provide feedback about the scores to participant;To provide support and resources with identified psychosocial needs;Encouraged to exercise    Expected Outcomes  Short Term goal: Utilizing psychosocial counselor, staff and physician to assist with identification of specific Stressors or current issues interfering with healing process. Setting desired goal for each stressor or current issue identified.;Long Term Goal: Stressors or current issues are controlled or eliminated.;Short Term goal: Identification and review with participant of any Quality of Life or Depression concerns found by scoring the questionnaire.;Long Term goal: The participant improves quality of Life and PHQ9 Scores as seen by post scores and/or verbalization of changes       Quality of Life Scores:  Quality of Life - 08/30/18 0723      Quality of Life   Select  Quality of  Life      Quality of Life Scores   Health/Function Pre  30 %    Socioeconomic Pre  30 %    Psych/Spiritual Pre  30 %    Family Pre  30 %    GLOBAL Pre  30 %      Scores of 19 and below usually indicate a poorer quality of life in these areas.  A difference of  2-3 points is a clinically meaningful difference.  A difference of 2-3 points in the total score of the Quality of Life Index has been associated with significant improvement in overall  quality of life, self-image, physical symptoms, and general health in studies assessing change in quality of life.  PHQ-9: Recent Review Flowsheet Data    Depression screen [facility] 2/9 06/21/2018 10/26/2017 07/25/2017   Decreased Interest 0 0 0   Down, Depressed, Hopeless 0 0 0   PHQ - 2 Score 0 0 0   Altered sleeping 0 - 2   Tired, decreased energy 0 - 0   Change in appetite 0 - 0   Feeling bad or failure about yourself  0 - 0   Trouble concentrating 0 - 0   Moving slowly or fidgety/restless 0 - 0   Suicidal thoughts 0 - 0   PHQ-9 Score 0 - 2   Difficult doing work/chores Not difficult at all - Not difficult at all     Interpretation of Total Score  Total Score Depression Severity:  1-4 = Minimal depression, 5-9 = Mild depression, 10-14 = Moderate depression, 15-19 = Moderately severe depression, 20-27 = Severe depression   Psychosocial Evaluation and Intervention: Psychosocial Evaluation - 08/29/18 1225      Psychosocial Evaluation & Interventions   Interventions  Encouraged to exercise with the program and follow exercise prescription    Comments  Whitaker is doing well with no barriers reported. He lives in a new house with his daughter. He will complete the questionnaires when he enrolls in [place] as soon as he get the MyChart activation email I just sent to him. He does want to lose some weight and to quit the tobacco habit.    Expected Outcomes  Raine will attend the Cardiac Rehab program on a regular basis. Working on his risk factor control goals of weight management and tobacco cessation.    Continue Psychosocial Services   Follow up required by staff       Psychosocial Re-Evaluation:   Psychosocial Discharge (Final Psychosocial Re-Evaluation):   Vocational Rehabilitation: Provide vocational rehab assistance to qualifying candidates.   Vocational Rehab Evaluation & Intervention: Vocational Rehab - 08/29/18 1220      Initial Vocational Rehab Evaluation & Intervention    Assessment shows need for Vocational Rehabilitation  No       Education: Education Goals: Education classes will be provided on a variety of topics geared toward better understanding of heart health and risk factor modification. Participant will state understanding/return demonstration of topics presented as noted by education test scores.  Learning Barriers/Preferences: Learning Barriers/Preferences - 08/29/18 1205      Learning Barriers/Preferences   Learning Barriers  Reading    Learning Preferences  Individual Instruction;Verbal Instruction       Education Topics:  AED/CPR: - Group verbal and written instruction with the use of models to demonstrate the basic use of the AED with the basic ABC's of resuscitation.   General Nutrition Guidelines/Fats and Fiber: -Group instruction provided by verbal, written material, models and posters to  present the general guidelines for heart healthy nutrition. Gives an explanation and review of dietary fats and fiber.   Controlling Sodium/Reading Food Labels: -Group verbal and written material supporting the discussion of sodium use in heart healthy nutrition. Review and explanation with models, verbal and written materials for utilization of the food label.   Exercise Physiology & General Exercise Guidelines: - Group verbal and written instruction with models to review the exercise physiology of the cardiovascular system and associated critical values. Provides general exercise guidelines with specific guidelines to those with heart or lung disease.    Aerobic Exercise & Resistance Training: - Gives group verbal and written instruction on the various components of exercise. Focuses on aerobic and resistive training programs and the benefits of this training and how to safely progress through these programs..   Flexibility, Balance, Mind/Body Relaxation: Provides group verbal/written instruction on the benefits of flexibility and  balance training, including mind/body exercise modes such as yoga, pilates and tai chi.  Demonstration and skill practice provided.   Stress and Anxiety: - Provides group verbal and written instruction about the health risks of elevated stress and causes of high stress.  Discuss the correlation between heart/lung disease and anxiety and treatment options. Review healthy ways to manage with stress and anxiety.   Depression: - Provides group verbal and written instruction on the correlation between heart/lung disease and depressed mood, treatment options, and the stigmas associated with seeking treatment.   Anatomy & Physiology of the Heart: - Group verbal and written instruction and models provide basic cardiac anatomy and physiology, with the coronary electrical and arterial systems. Review of Valvular disease and Heart Failure   Cardiac Procedures: - Group verbal and written instruction to review commonly prescribed medications for heart disease. Reviews the medication, class of the drug, and side effects. Includes the steps to properly store meds and maintain the prescription regimen. (beta blockers and nitrates)   Cardiac Medications I: - Group verbal and written instruction to review commonly prescribed medications for heart disease. Reviews the medication, class of the drug, and side effects. Includes the steps to properly store meds and maintain the prescription regimen.   Cardiac Medications II: -Group verbal and written instruction to review commonly prescribed medications for heart disease. Reviews the medication, class of the drug, and side effects. (all other drug classes)    Go Sex-Intimacy & Heart Disease, Get SMART - Goal Setting: - Group verbal and written instruction through game format to discuss heart disease and the return to sexual intimacy. Provides group verbal and written material to discuss and apply goal setting through the application of the S.M.A.R.T.  Method.   Other Matters of the Heart: - Provides group verbal, written materials and models to describe Stable Angina and Peripheral Artery. Includes description of the disease process and treatment options available to the cardiac patient.   Exercise & Equipment Safety: - Individual verbal instruction and demonstration of equipment use and safety with use of the equipment.   Cardiac Rehab from 09/04/2018 in [facility] Cardiac and Pulmonary Rehab  Date  09/04/18  Educator  [place]  Instruction Review Code  1- Verbalizes Understanding      Infection Prevention: - Provides verbal and written material to individual with discussion of infection control including proper hand washing and proper equipment cleaning during exercise session.   Cardiac Rehab from 09/04/2018 in [facility] Cardiac and Pulmonary Rehab  Date  09/04/18  Educator  [place]  Instruction Review Code  1- Verbalizes Understanding  Falls Prevention: - Provides verbal and written material to individual with discussion of falls prevention and safety.   Cardiac Rehab from 09/04/2018 in [facility] Cardiac and Pulmonary Rehab  Date  09/04/18  Educator  [place]  Instruction Review Code  1- Verbalizes Understanding      Diabetes: - Individual verbal and written instruction to review signs/symptoms of diabetes, desired ranges of glucose level fasting, after meals and with exercise. Acknowledge that pre and post exercise glucose checks will be done for 3 sessions at entry of program.   Cardiac Rehab from 09/04/2018 in [facility] Cardiac and Pulmonary Rehab  Date  09/04/18  Educator  [place]  Instruction Review Code  1- Verbalizes Understanding      Know Your Numbers and Risk Factors: -Group verbal and written instruction about important numbers in your health.  Discussion of what are risk factors and how they play a role in the disease process.  Review of Cholesterol, Blood Pressure, Diabetes, and BMI and the role they play in your overall health.   Sleep  Hygiene: -Provides group verbal and written instruction about how sleep can affect your health.  Define sleep hygiene, discuss sleep cycles and impact of sleep habits. Review good sleep hygiene tips.    Other: -Provides group and verbal instruction on various topics (see comments)   Knowledge Questionnaire Score: Knowledge Questionnaire Score - 08/30/18 0724      Knowledge Questionnaire Score   Pre Score  16/26  Missed Heart Attack, angina, PAD, Heart Failure, Nutrition and Exercise questions       Core Components/Risk Factors/Patient Goals at Admission: Personal Goals and Risk Factors at Admission - 08/29/18 1221      Core Components/Risk Factors/Patient Goals on Admission    Weight Management  Yes;Weight Loss    Intervention  Weight Management: Develop a combined nutrition and exercise program designed to reach desired caloric intake, while maintaining appropriate intake of nutrient and fiber, sodium and fats, and appropriate energy expenditure required for the weight goal.;Weight Management: Provide education and appropriate resources to help participant work on and attain dietary goals.;Weight Management/Obesity: Establish reasonable short term and long term weight goals.    Expected Outcomes  Short Term: Continue to assess and modify interventions until short term weight is achieved;Long Term: Adherence to nutrition and physical activity/exercise program aimed toward attainment of established weight goal;Weight Loss: Understanding of general recommendations for a balanced deficit meal plan, which promotes 1-2 lb weight loss per week and includes a negative energy balance of [phone] kcal/d    Tobacco Cessation  Yes    Number of packs per day  uses dip   only 1 dip a day   Wants to Quit    Intervention  Assist the participant in steps to quit. Provide individualized education and counseling about committing to Tobacco Cessation, relapse prevention, and pharmacological support that can be  provided by physician.;Advice worker, assist with locating and accessing local/national Quit Smoking programs, and support quit date choice.    Expected Outcomes  Short Term: Will demonstrate readiness to quit, by selecting a quit date.;Short Term: Will quit all tobacco product use, adhering to prevention of relapse plan.;Long Term: Complete abstinence from all tobacco products for at least 12 months from quit date.    Diabetes  Yes    Intervention  Provide education about signs/symptoms and action to take for hypo/hyperglycemia.;Provide education about proper nutrition, including hydration, and aerobic/resistive exercise prescription along with prescribed medications to achieve blood glucose in normal ranges: Fasting  glucose 65-99 mg/dL    Expected Outcomes  Short Term: Participant verbalizes understanding of the signs/symptoms and immediate care of hyper/hypoglycemia, proper foot care and importance of medication, aerobic/resistive exercise and nutrition plan for blood glucose control.;Long Term: Attainment of HbA1C < 7%.    Hypertension  Yes    Intervention  Provide education on lifestyle modifcations including regular physical activity/exercise, weight management, moderate sodium restriction and increased consumption of fresh fruit, vegetables, and low fat dairy, alcohol moderation, and smoking cessation.;Monitor prescription use compliance.    Expected Outcomes  Short Term: Continued assessment and intervention until BP is < 140/39m HG in hypertensive participants. < 130/880mHG in hypertensive participants with diabetes, heart failure or chronic kidney disease.;Long Term: Maintenance of blood pressure at goal levels.    Lipids  Yes    Intervention  Provide education and support for participant on nutrition & aerobic/resistive exercise along with prescribed medications to achieve LDL '70mg'$ , HDL >'40mg'$ .    Expected Outcomes  Short Term: Participant states understanding of desired  cholesterol values and is compliant with medications prescribed. Participant is following exercise prescription and nutrition guidelines.;Long Term: Cholesterol controlled with medications as prescribed, with individualized exercise RX and with personalized nutrition plan. Value goals: LDL < '70mg'$ , HDL > 40 mg.       Core Components/Risk Factors/Patient Goals Review:    Core Components/Risk Factors/Patient Goals at Discharge (Final Review):    ITP Comments: ITP Comments    Row Name 08/29/18 1230           ITP Comments  Virtual Orientation completed today   [facility]ation of the Diagnosis [facility]          Comments: Initial ITP

## 2018-09-04 NOTE — Patient Instructions (Signed)
Patient Instructions  Patient Details  Name: Corey [name] MRN: [id number] Date of Birth: 02-10-1955 Referring Provider:  Alwyn Peaallwood, Dwayne D, MD  Below are your personal goals for exercise, nutrition, and risk factors. Our goal is to help you stay on track towards obtaining and maintaining these goals. We will be discussing your progress on these goals with you throughout the program.  Initial Exercise Prescription: Initial Exercise Prescription - 09/04/18 1500      Date of Initial Exercise RX and Referring Provider   Date  09/04/18    [facility]      Treadmill   MPH  2.2    Grade  1    Minutes  15    METs  3      NuStep   Level  2    SPM  80    Minutes  15    METs  3      Arm Ergometer   Level  1    Watts  10    Minutes  15    METs  2      Biostep-RELP   Level  2    SPM  50    Minutes  15    METs  2      Prescription Details   Frequency (times per week)  3    Duration  Progress to 30 minutes of continuous aerobic without signs/symptoms of physical distress      Intensity   THRR 40-80% of Max Heartrate  112-143    Ratings of Perceived Exertion  11-15    Perceived Dyspnea  0-4      Progression   Progression  Continue progressive overload as per policy without signs/symptoms or physical distress.      Resistance Training   Training Prescription  Yes    Weight  3    Reps  10-15       Exercise Goals: Frequency: Be able to perform aerobic exercise two to three times per week in program working toward 2-5 days per week of home exercise.  Intensity: Work with a perceived exertion of 11 (fairly light) - 15 (hard) while following your exercise prescription.  We will make changes to your prescription with you as you progress through the program.   Duration: Be able to do 30 to 45 minutes of continuous aerobic exercise in addition to a 5 minute warm-up and a 5 minute cool-down routine.   Nutrition Goals: Your personal nutrition goals will be  established when you do your nutrition analysis with the dietician.  The following are general nutrition guidelines to follow: Cholesterol < 200mg /day Sodium < 1500mg /day Fiber: Men over 50 yrs - 30 grams per day  Personal Goals: Personal Goals and Risk Factors at Admission - 08/29/18 1221      Core Components/Risk Factors/Patient Goals on Admission    Weight Management  Yes;Weight Loss    Intervention  Weight Management: Develop a combined nutrition and exercise program designed to reach desired caloric intake, while maintaining appropriate intake of nutrient and fiber, sodium and fats, and appropriate energy expenditure required for the weight goal.;Weight Management: Provide education and appropriate resources to help participant work on and attain dietary goals.;Weight Management/Obesity: Establish reasonable short term and long term weight goals.    Expected Outcomes  Short Term: Continue to assess and modify interventions until short term weight is achieved;Long Term: Adherence to nutrition and physical activity/exercise program aimed toward attainment of established weight goal;Weight Loss: Understanding  of general recommendations for a balanced deficit meal plan, which promotes 1-2 lb weight loss per week and includes a negative energy balance of [phone] kcal/d    Tobacco Cessation  Yes    Number of packs per day  uses dip   only 1 dip a day   Wants to Quit    Intervention  Assist the participant in steps to quit. Provide individualized education and counseling about committing to Tobacco Cessation, relapse prevention, and pharmacological support that can be provided by physician.;Advice worker, assist with locating and accessing local/national Quit Smoking programs, and support quit date choice.    Expected Outcomes  Short Term: Will demonstrate readiness to quit, by selecting a quit date.;Short Term: Will quit all tobacco product use, adhering to prevention of relapse  plan.;Long Term: Complete abstinence from all tobacco products for at least 12 months from quit date.    Diabetes  Yes    Intervention  Provide education about signs/symptoms and action to take for hypo/hyperglycemia.;Provide education about proper nutrition, including hydration, and aerobic/resistive exercise prescription along with prescribed medications to achieve blood glucose in normal ranges: Fasting glucose 65-99 mg/dL    Expected Outcomes  Short Term: Participant verbalizes understanding of the signs/symptoms and immediate care of hyper/hypoglycemia, proper foot care and importance of medication, aerobic/resistive exercise and nutrition plan for blood glucose control.;Long Term: Attainment of HbA1C < 7%.    Hypertension  Yes    Intervention  Provide education on lifestyle modifcations including regular physical activity/exercise, weight management, moderate sodium restriction and increased consumption of fresh fruit, vegetables, and low fat dairy, alcohol moderation, and smoking cessation.;Monitor prescription use compliance.    Expected Outcomes  Short Term: Continued assessment and intervention until BP is < 140/65mm HG in hypertensive participants. < 130/64mm HG in hypertensive participants with diabetes, heart failure or chronic kidney disease.;Long Term: Maintenance of blood pressure at goal levels.    Lipids  Yes    Intervention  Provide education and support for participant on nutrition & aerobic/resistive exercise along with prescribed medications to achieve LDL 70mg , HDL >40mg .    Expected Outcomes  Short Term: Participant states understanding of desired cholesterol values and is compliant with medications prescribed. Participant is following exercise prescription and nutrition guidelines.;Long Term: Cholesterol controlled with medications as prescribed, with individualized exercise RX and with personalized nutrition plan. Value goals: LDL < 70mg , HDL > 40 mg.       Tobacco Use Initial  Evaluation: Social History   Tobacco Use  Smoking Status Former Smoker  . Packs/day: 0.50  . Types: Cigarettes  Smokeless Tobacco Current User  . Types: Chew  Tobacco Comment   quit 40 years ago     Exercise Goals and Review: Exercise Goals    Row Name 09/04/18 1546             Exercise Goals   Increase Physical Activity  Yes       Intervention  Provide advice, education, support and counseling about physical activity/exercise needs.;Develop an individualized exercise prescription for aerobic and resistive training based on initial evaluation findings, risk stratification, comorbidities and participant's personal goals.       Expected Outcomes  Short Term: Attend rehab on a regular basis to increase amount of physical activity.;Long Term: Add in home exercise to make exercise part of routine and to increase amount of physical activity.;Long Term: Exercising regularly at least 3-5 days a week.       Increase Strength and Stamina  Yes  Intervention  Provide advice, education, support and counseling about physical activity/exercise needs.;Develop an individualized exercise prescription for aerobic and resistive training based on initial evaluation findings, risk stratification, comorbidities and participant's personal goals.       Expected Outcomes  Short Term: Increase workloads from initial exercise prescription for resistance, speed, and METs.;Short Term: Perform resistance training exercises routinely during rehab and add in resistance training at home;Long Term: Improve cardiorespiratory fitness, muscular endurance and strength as measured by increased METs and functional capacity ( )       Able to understand and use rate of perceived exertion (RPE) scale  Yes       Intervention  Provide education and explanation on how to use RPE scale       Expected Outcomes  Short Term: Able to use RPE daily in rehab to express subjective intensity level;Long Term:  Able to use RPE to guide  intensity level when exercising independently       Able to understand and use Dyspnea scale  Yes       Intervention  Provide education and explanation on how to use Dyspnea scale       Expected Outcomes  Short Term: Able to use Dyspnea scale daily in rehab to express subjective sense of shortness of breath during exertion;Long Term: Able to use Dyspnea scale to guide intensity level when exercising independently       Knowledge and understanding of Target Heart Rate Range (THRR)  Yes       Intervention  Provide education and explanation of THRR including how the numbers were predicted and where they are located for reference       Expected Outcomes  Short Term: Able to state/look up THRR;Long Term: Able to use THRR to govern intensity when exercising independently;Short Term: Able to use daily as guideline for intensity in rehab       Able to check pulse independently  Yes       Intervention  Provide education and demonstration on how to check pulse in carotid and radial arteries.;Review the importance of being able to check your own pulse for safety during independent exercise       Expected Outcomes  Short Term: Able to explain why pulse checking is important during independent exercise;Long Term: Able to check pulse independently and accurately       Understanding of Exercise Prescription  Yes       Intervention  Provide education, explanation, and written materials on patient's individual exercise prescription       Expected Outcomes  Short Term: Able to explain program exercise prescription;Long Term: Able to explain home exercise prescription to exercise independently          Copy of goals given to participant.

## 2018-09-04 NOTE — Progress Notes (Signed)
Daily Session Note  Patient Details  Name: Corey [name] MRN: [id number] Date of Birth: Apr 02, 1955 Referring Provider:     Cardiac Rehab from 09/04/2018 in [facility] Cardiac [facility]  Referring Provider  [facility]      Encounter Date: 09/04/2018  Check In: Session Check In - 09/04/18 1538      Check-In   Supervising physician immediately available to respond to emergencies  See telemetry face sheet for immediately available ER MD    Location  [facility]-Cardiac & Pulmonary Rehab    Staff Present  Heath Lark, RN, BSN, CCRP;  BS, Exercise Physiologist    Virtual Visit  No    Medication changes reported      No    Fall or balance concerns reported     No    Warm-up and Cool-down  Not performed (comment)    Resistance Training Performed  Yes    VAD Patient?  No    PAD/SET Patient?  No        Exercise Prescription Changes - 09/04/18 1500      Response to Exercise   Blood Pressure (Admit)  114/64    Blood Pressure (Exercise)  142/70    Blood Pressure (Exit)  130/70    Heart Rate (Admit)  82 bpm    Heart Rate (Exercise)  128 bpm    Heart Rate (Exit)  80 bpm    Rating of Perceived Exertion (Exercise)  11    Perceived Dyspnea (Exercise)  0    Symptoms  no    Duration  Progress to 30 minutes of  aerobic without signs/symptoms of physical distress    Intensity  THRR unchanged      Progression   Progression  Continue to progress workloads to maintain intensity without signs/symptoms of physical distress.       Social History   Tobacco Use  Smoking Status Former Smoker  . Packs/day: 0.50  . Types: Cigarettes  Smokeless Tobacco Current User  . Types: Chew  Tobacco Comment   quit 40 years ago     Goals Met:  Independence with exercise equipment Exercise tolerated well No report of cardiac concerns or symptoms Strength training completed today  Goals Unmet:  Not Applicable  Comments:  6 Minute Walk    Row Name 09/04/18 1540         6 Minute  Walk   Phase  Initial     Distance  1342 feet     Walk Time  6 minutes     # of Rest Breaks  0     MPH  2.54     METS  3.52     RPE  11     Perceived Dyspnea   0     VO2 Peak  12.33     Resting HR  82 bpm     Resting BP  114/64     Max Ex. HR  128 bpm     Max Ex. BP  142/70     2 Minute Post BP  130/70          Dr. Emily Filbert is Medical Director for [place] and [facility].

## 2018-09-05 DIAGNOSIS — E1159 Type 2 diabetes mellitus with other circulatory complications: Secondary | ICD-10-CM | POA: Diagnosis not present

## 2018-09-05 DIAGNOSIS — I2510 Atherosclerotic heart disease of native coronary artery without angina pectoris: Secondary | ICD-10-CM | POA: Diagnosis not present

## 2018-09-05 DIAGNOSIS — I200 Unstable angina: Secondary | ICD-10-CM | POA: Diagnosis not present

## 2018-09-05 DIAGNOSIS — I25118 Atherosclerotic heart disease of native coronary artery with other forms of angina pectoris: Secondary | ICD-10-CM | POA: Diagnosis not present

## 2018-09-05 DIAGNOSIS — R0602 Shortness of breath: Secondary | ICD-10-CM | POA: Diagnosis not present

## 2018-09-06 ENCOUNTER — Encounter: Payer: Medicare Other | Admitting: *Deleted

## 2018-09-06 ENCOUNTER — Other Ambulatory Visit: Payer: Self-pay

## 2018-09-06 DIAGNOSIS — Z7982 Long term (current) use of aspirin: Secondary | ICD-10-CM | POA: Diagnosis not present

## 2018-09-06 DIAGNOSIS — Z955 Presence of coronary angioplasty implant and graft: Secondary | ICD-10-CM | POA: Diagnosis not present

## 2018-09-06 DIAGNOSIS — J45909 Unspecified asthma, uncomplicated: Secondary | ICD-10-CM | POA: Diagnosis not present

## 2018-09-06 DIAGNOSIS — I10 Essential (primary) hypertension: Secondary | ICD-10-CM | POA: Diagnosis not present

## 2018-09-06 DIAGNOSIS — E785 Hyperlipidemia, unspecified: Secondary | ICD-10-CM | POA: Diagnosis not present

## 2018-09-06 DIAGNOSIS — Z794 Long term (current) use of insulin: Secondary | ICD-10-CM | POA: Diagnosis not present

## 2018-09-06 DIAGNOSIS — E119 Type 2 diabetes mellitus without complications: Secondary | ICD-10-CM | POA: Diagnosis not present

## 2018-09-06 DIAGNOSIS — Z87891 Personal history of nicotine dependence: Secondary | ICD-10-CM | POA: Diagnosis not present

## 2018-09-06 DIAGNOSIS — Z79899 Other long term (current) drug therapy: Secondary | ICD-10-CM | POA: Diagnosis not present

## 2018-09-06 LAB — GLUCOSE, CAPILLARY
Glucose-Capillary: 214 mg/dL — ABNORMAL HIGH (ref 70–99)
Glucose-Capillary: 169 mg/dL — ABNORMAL HIGH (ref 70–99)

## 2018-09-06 NOTE — Progress Notes (Signed)
Daily Session Note  Patient Details  Name: Corey [name] MRN: [id number] Date of Birth: October 31, 1955 Referring Provider:     Cardiac Rehab from 09/04/2018 in [facility] Cardiac and Pulmonary Rehab  Referring Provider  [facility]      Encounter Date: 09/06/2018  Check In: Session Check In - 09/06/18 1616      Check-In   Supervising physician immediately available to respond to emergencies  See telemetry face sheet for immediately available ER MD    Location  [facility]-Cardiac & Pulmonary Rehab    Staff Present  Carson Myrtle, BS, RRT, CPFT;Meredith Sherryll Burger, RN BSN;Joseph Hood RCP,RRT,BSRT    Virtual Visit  No    Medication changes reported      No    Warm-up and Cool-down  Performed on first and last piece of equipment    Resistance Training Performed  Yes    VAD Patient?  No    PAD/SET Patient?  No      Pain Assessment   Currently in Pain?  No/denies          Social History   Tobacco Use  Smoking Status Former Smoker  . Packs/day: 0.50  . Types: Cigarettes  Smokeless Tobacco Current User  . Types: Chew  Tobacco Comment   quit 40 years ago     Goals Met:  Independence with exercise equipment Exercise tolerated well No report of cardiac concerns or symptoms Strength training completed today  Goals Unmet:  Not Applicable  Comments: First full day of exercise!  Patient was oriented to gym and equipment including functions, settings, policies, and procedures.  Patient's individual exercise prescription and treatment plan were reviewed.  All starting workloads were established based on the results of the 6 minute walk test done at initial orientation visit.  The plan for exercise progression was also introduced and progression will be customized based on patient's performance and goals.     Dr. Emily Filbert is Medical Director for [place] [facility].

## 2018-09-11 DIAGNOSIS — J45909 Unspecified asthma, uncomplicated: Secondary | ICD-10-CM | POA: Diagnosis not present

## 2018-09-12 ENCOUNTER — Encounter: Payer: Self-pay | Admitting: *Deleted

## 2018-09-12 DIAGNOSIS — Z794 Long term (current) use of insulin: Secondary | ICD-10-CM | POA: Diagnosis not present

## 2018-09-12 DIAGNOSIS — Z955 Presence of coronary angioplasty implant and graft: Secondary | ICD-10-CM

## 2018-09-12 DIAGNOSIS — E1159 Type 2 diabetes mellitus with other circulatory complications: Secondary | ICD-10-CM | POA: Diagnosis not present

## 2018-09-12 DIAGNOSIS — E1165 Type 2 diabetes mellitus with hyperglycemia: Secondary | ICD-10-CM | POA: Diagnosis not present

## 2018-09-12 DIAGNOSIS — E1169 Type 2 diabetes mellitus with other specified complication: Secondary | ICD-10-CM | POA: Diagnosis not present

## 2018-09-12 DIAGNOSIS — E1142 Type 2 diabetes mellitus with diabetic polyneuropathy: Secondary | ICD-10-CM | POA: Diagnosis not present

## 2018-09-12 NOTE — Progress Notes (Signed)
Cardiac Individual Treatment Plan  Patient Details  Name: Corey [name] MRN: [id number] Date of Birth: Feb 22, 1955 Referring Provider:     Cardiac Rehab from 09/04/2018 in [facility] Cardiac and Pulmonary Rehab  Referring Provider  [facility]      Initial Encounter Date:    Cardiac Rehab from 09/04/2018 in [facility] Cardiac and Pulmonary Rehab  Date  09/04/18      Visit Diagnosis: Status post coronary artery stent placement  Patient's Home Medications on Admission:  Current Outpatient Medications:  .  ACCU-CHEK FASTCLIX LANCETS MISC, USE TO CHECK BLOOD SUGAR, Disp: , Rfl: 12 .  ACCU-CHEK GUIDE test strip, USE TO CHECK BLOOD SUGAR TID, Disp: 100 each, Rfl: 12 .  acetaminophen (TYLENOL) 500 MG tablet, Take 1,000 mg by mouth every 6 (six) hours as needed for moderate pain or headache., Disp: , Rfl:  .  albuterol (PROVENTIL) (2.5 MG/3ML) 0.083% nebulizer solution, Take 3 mLs (2.5 mg total) by nebulization every 6 (six) hours as needed for wheezing or shortness of breath., Disp: 150 mL, Rfl: 1 .  amitriptyline (ELAVIL) 50 MG tablet, Take 1-2 tablets (50-100 mg total) by mouth at bedtime., Disp: 180 tablet, Rfl: 1 .  ammonium lactate (AMLACTIN) 12 % lotion, Apply to affected area twice daily as needed (Patient taking differently: Apply 1 application topically daily. ), Disp: 396 g, Rfl: 1 .  aspirin EC 81 MG tablet, Take 81 mg by mouth daily., Disp: , Rfl:  .  azelastine (ASTELIN) 0.1 % nasal spray, Place 2 sprays into both nostrils 2 (two) times daily. Use in each nostril as directed, Disp: 30 mL, Rfl: 6 .  B-D UF III MINI PEN NEEDLES 31G X 5 MM MISC, USE TWICE DAILY, Disp: 100 each, Rfl: 11 .  Blood Glucose Monitoring Suppl (ACCU-CHEK GUIDE) w/Device KIT, U UTD, Disp: , Rfl: 0 .  cetirizine (ZYRTEC) 10 MG tablet, Take 1 tablet (10 mg total) by mouth daily., Disp: 90 tablet, Rfl: 1 .  Cholecalciferol (VITAMIN D3) 5000 units TABS, Take 5,000 Units by mouth daily. , Disp: , Rfl:  .  citalopram (CELEXA) 10  MG tablet, TAKE 1 TABLET(10 MG) BY MOUTH DAILY (Patient taking differently: Take 10 mg by mouth daily. ), Disp: 90 tablet, Rfl: 1 .  diclofenac sodium (VOLTAREN) 1 % GEL, Apply 1 application topically 4 (four) times daily as needed (pain). , Disp: , Rfl:  .  Fluticasone-Salmeterol (ADVAIR) 500-50 MCG/DOSE AEPB, Inhale 1 puff into the lungs 2 (two) times daily., Disp: 1 each, Rfl: 6 .  furosemide (LASIX) 20 MG tablet, TAKE 1 TABLET(20 MG) BY MOUTH TWICE DAILY, Disp: 180 tablet, Rfl: 1 .  GLYXAMBI 25-5 MG TABS, TAKE 1 TABLET BY MOUTH EVERY MORNING, Disp: 90 tablet, Rfl: 1 .  Insulin Syringe-Needle U-100 30G X 1/2" 1 ML MISC, 1 Units by Does not apply route daily., Disp: 90 each, Rfl: 3 .  Lancets Misc. (ACCU-CHEK FASTCLIX LANCET) KIT, 1 Units by Does not apply route 2 (two) times daily., Disp: 3 kit, Rfl: 3 .  LANTUS SOLOSTAR 100 UNIT/ML Solostar Pen, INJECT 75 UNITS [place] D., Disp: 45 mL, Rfl: 11 .  lisinopril (ZESTRIL) 10 MG tablet, TAKE 1 TABLET(10 MG) BY MOUTH DAILY (Patient taking differently: Take 10 mg by mouth daily. ), Disp: 90 tablet, Rfl: 1 .  magnesium gluconate (MAGONATE) 500 MG tablet, Take 500 mg by mouth daily., Disp: , Rfl:  .  meloxicam (MOBIC) 15 MG tablet, Take 1 tablet (15 mg total) by mouth daily as  needed for pain. TAKE 1 TABLET(15 MG) BY MOUTH DAILY, Disp: 90 tablet, Rfl: 0 .  metFORMIN (GLUCOPHAGE) 1000 MG tablet, Take 1 tablet (1,000 mg total) by mouth 2 (two) times daily., Disp: 180 tablet, Rfl: 1 .  montelukast (SINGULAIR) 10 MG tablet, TAKE 1 TABLET(10 MG) BY MOUTH DAILY (Patient taking differently: Take 10 mg by mouth daily. ), Disp: 90 tablet, Rfl: 1 .  OZEMPIC, 0.25 OR 0.5 MG/DOSE, 2 MG/1.5ML SOPN, INJECT 0.25 MG UNDER THE SKIN ONCE PER WEEK (Patient not taking: Reported on 08/29/2018), Disp: 1.5 mL, Rfl: 3 .  OZEMPIC, 1 MG/DOSE, 2 MG/1.5ML SOPN, Inject 1 mg as directed every Friday., Disp: , Rfl:  .  pantoprazole (PROTONIX) 40 MG tablet, Take 1 tablet (40 mg total) by mouth  daily., Disp: 30 tablet, Rfl: 3 .  pregabalin (LYRICA) 100 MG capsule, TAKE 1 CAPSULE(100 MG) BY MOUTH TWICE DAILY, Disp: 180 capsule, Rfl: 1 .  primidone (MYSOLINE) 50 MG tablet, TAKE 2 TABLETS(100 MG) BY MOUTH DAILY, Disp: 180 tablet, Rfl: 1 .  PROAIR HFA 108 (90 Base) MCG/ACT inhaler, INHALE 2 PUFFS INTO THE LUNGS EVERY 6 HOURS AS NEEDED FOR WHEEZING OR SHORTNESS OF BREATH (Patient taking differently: Inhale 2 puffs into the lungs every 6 (six) hours as needed for wheezing or shortness of breath. ), Disp: 3 Inhaler, Rfl: 1 .  rosuvastatin (CRESTOR) 20 MG tablet, TAKE 1 TABLET(20 MG) BY MOUTH DAILY (Patient taking differently: Take 20 mg by mouth at bedtime. ), Disp: 90 tablet, Rfl: 1 .  sucralfate (CARAFATE) 1 g tablet, Take 1 tablet (1 g total) by mouth 3 (three) times daily as needed., Disp: 90 tablet, Rfl: 0  Past Medical History: Past Medical History:  Diagnosis Date  . Asthma   . Diabetes ([place])   . Hyperlipidemia   . Hypertension   . Legg-Perthes disease     Tobacco Use: Social History   Tobacco Use  Smoking Status Former Smoker  . Packs/day: 0.50  . Types: Cigarettes  Smokeless Tobacco Current User  . Types: Chew  Tobacco Comment   quit 40 years ago     Labs: Recent Review Flowsheet Data    Labs for ITP Cardiac and Pulmonary Rehab Latest Ref Rng & Units 04/21/2017 07/25/2017 10/26/2017 01/29/2018 03/02/2018   Cholestrol <200 mg/dL - 167 - 157 -   LDLCALC 0 - 99 mg/dL - 73 - - -   HDL >39 mg/dL - 38(L) - - -   Trlycerides <150 mg/dL - 282(H) - 292(H) -   Hemoglobin A1c <7.0 % 7.1(H) 9.2(H) 9.0(H) 8.8(H) 9.7(H)       Exercise Target Goals: Exercise Program Goal: Individual exercise prescription set using results from initial 6 min walk test and THRR while considering  patient's activity barriers and safety.   Exercise Prescription Goal: Initial exercise prescription builds to 30-45 minutes a day of aerobic activity, 2-3 days per week.  Home exercise guidelines will  be given to patient during program as part of exercise prescription that the participant will acknowledge.  Activity Barriers & Risk Stratification: Activity Barriers & Cardiac Risk Stratification - 08/29/18 1214      Activity Barriers & Cardiac Risk Stratification   Activity Barriers  None    Cardiac Risk Stratification  Moderate       6 Minute Walk: 6 Minute Walk    Row Name 09/04/18 1540         6 Minute Walk   Phase  Initial     Distance  1342 feet     Walk Time  6 minutes     # of Rest Breaks  0     MPH  2.54     METS  3.52     RPE  11     Perceived Dyspnea   0     VO2 Peak  12.33     Resting HR  82 bpm     Resting BP  114/64     Max Ex. HR  128 bpm     Max Ex. BP  142/70     2 Minute Post BP  130/70        Oxygen Initial Assessment:   Oxygen Re-Evaluation:   Oxygen Discharge (Final Oxygen Re-Evaluation):   Initial Exercise Prescription: Initial Exercise Prescription - 09/04/18 1500      Date of Initial Exercise RX and Referring Provider   Date  09/04/18    Referring Provider  [facility]      Treadmill   MPH  2.2    Grade  1    Minutes  15    METs  3      NuStep   Level  2    SPM  80    Minutes  15    METs  3      Arm Ergometer   Level  1    Watts  10    Minutes  15    METs  2      Biostep-RELP   Level  2    SPM  50    Minutes  15    METs  2      Prescription Details   Frequency (times per week)  3    Duration  Progress to 30 minutes of continuous aerobic without signs/symptoms of physical distress      Intensity   THRR 40-80% of Max Heartrate  112-143    Ratings of Perceived Exertion  11-15    Perceived Dyspnea  0-4      Progression   Progression  Continue progressive overload as per policy without signs/symptoms or physical distress.      Resistance Training   Training Prescription  Yes    Weight  3    Reps  10-15       Perform Capillary Blood Glucose checks as needed.  Exercise Prescription Changes: Exercise  Prescription Changes    Row Name 09/04/18 1500             Response to Exercise   Blood Pressure (Admit)  114/64       Blood Pressure (Exercise)  142/70       Blood Pressure (Exit)  130/70       Heart Rate (Admit)  82 bpm       Heart Rate (Exercise)  128 bpm       Heart Rate (Exit)  80 bpm       Rating of Perceived Exertion (Exercise)  11       Perceived Dyspnea (Exercise)  0       Symptoms  no       Duration  Progress to 30 minutes of  aerobic without signs/symptoms of physical distress       Intensity  THRR unchanged         Progression   Progression  Continue to progress workloads to maintain intensity without signs/symptoms of physical distress.          Exercise Comments:   Exercise Goals and  Review: Exercise Goals    Row Name 09/04/18 1546             Exercise Goals   Increase Physical Activity  Yes       Intervention  Provide advice, education, support and counseling about physical activity/exercise needs.;Develop an individualized exercise prescription for aerobic and resistive training based on initial evaluation findings, risk stratification, comorbidities and participant's personal goals.       Expected Outcomes  Short Term: Attend rehab on a regular basis to increase amount of physical activity.;Long Term: Add in home exercise to make exercise part of routine and to increase amount of physical activity.;Long Term: Exercising regularly at least 3-5 days a week.       Increase Strength and Stamina  Yes       Intervention  Provide advice, education, support and counseling about physical activity/exercise needs.;Develop an individualized exercise prescription for aerobic and resistive training based on initial evaluation findings, risk stratification, comorbidities and participant's personal goals.       Expected Outcomes  Short Term: Increase workloads from initial exercise prescription for resistance, speed, and METs.;Short Term: Perform resistance training  exercises routinely during rehab and add in resistance training at home;Long Term: Improve cardiorespiratory fitness, muscular endurance and strength as measured by increased METs and functional capacity (6MWT)       Able to understand and use rate of perceived exertion (RPE) scale  Yes       Intervention  Provide education and explanation on how to use RPE scale       Expected Outcomes  Short Term: Able to use RPE daily in rehab to express subjective intensity level;Long Term:  Able to use RPE to guide intensity level when exercising independently       Able to understand and use Dyspnea scale  Yes       Intervention  Provide education and explanation on how to use Dyspnea scale       Expected Outcomes  Short Term: Able to use Dyspnea scale daily in rehab to express subjective sense of shortness of breath during exertion;Long Term: Able to use Dyspnea scale to guide intensity level when exercising independently       Knowledge and understanding of Target Heart Rate Range (THRR)  Yes       Intervention  Provide education and explanation of THRR including how the numbers were predicted and where they are located for reference       Expected Outcomes  Short Term: Able to state/look up THRR;Long Term: Able to use THRR to govern intensity when exercising independently;Short Term: Able to use daily as guideline for intensity in rehab       Able to check pulse independently  Yes       Intervention  Provide education and demonstration on how to check pulse in carotid and radial arteries.;Review the importance of being able to check your own pulse for safety during independent exercise       Expected Outcomes  Short Term: Able to explain why pulse checking is important during independent exercise;Long Term: Able to check pulse independently and accurately       Understanding of Exercise Prescription  Yes       Intervention  Provide education, explanation, and written materials on patient's individual exercise  prescription       Expected Outcomes  Short Term: Able to explain program exercise prescription;Long Term: Able to explain home exercise prescription to exercise independently  Exercise Goals Re-Evaluation : Exercise Goals Re-Evaluation    [place] Name 09/06/18 1620             Exercise Goal Re-Evaluation   Exercise Goals Review  Able to understand and use rate of perceived exertion (RPE) scale;Increase Physical Activity;Knowledge and understanding of Target Heart Rate Range (THRR);Understanding of Exercise Prescription;Increase Strength and Stamina;Able to check pulse independently       Comments  Reviewed RPE scale, THR and program prescription with pt today.  Pt voiced understanding and was given a copy of goals to take home.       Expected Outcomes  Short: Use RPE daily to regulate intensity. Long: Follow program prescription in THR.          Discharge Exercise Prescription (Final Exercise Prescription Changes): Exercise Prescription Changes - 09/04/18 1500      Response to Exercise   Blood Pressure (Admit)  114/64    Blood Pressure (Exercise)  142/70    Blood Pressure (Exit)  130/70    Heart Rate (Admit)  82 bpm    Heart Rate (Exercise)  128 bpm    Heart Rate (Exit)  80 bpm    Rating of Perceived Exertion (Exercise)  11    Perceived Dyspnea (Exercise)  0    Symptoms  no    Duration  Progress to 30 minutes of  aerobic without signs/symptoms of physical distress    Intensity  THRR unchanged      Progression   Progression  Continue to progress workloads to maintain intensity without signs/symptoms of physical distress.       Nutrition:  Target Goals: Understanding of nutrition guidelines, daily intake of sodium <1533m, cholesterol <2064m calories 30% from fat and 7% or less from saturated fats, daily to have 5 or more servings of fruits and vegetables.  Biometrics: Pre Biometrics - 09/04/18 1542      Pre Biometrics   Height  _0  (1.727 m)    Weight  217 lb 8  oz (98.7 kg)    BMI (Calculated)  33.08    Single Leg Stand  14.86 seconds        Nutrition Therapy Plan and Nutrition Goals: Nutrition Therapy & Goals - 09/04/18 1548      Nutrition Therapy   Diet  Low Na, HH, DM diet    Protein (specify units)  80g    Fiber  30 grams    Whole Grain Foods  3 servings    Saturated Fats  12 max. grams    Fruits and Vegetables  5 servings/day    Sodium  1.5 grams      Personal Nutrition Goals   Nutrition Goal  ST: add protein/fat to fruit and spread out fruit consumption LT: walk better (breathing is hard)    Comments  Unable to review pt handout as pt reading literacy issues, discussed HH and DM friendly eating. Pt didn't give much in food frequency; mentions he eats the "stuff I know I shouldn't". A1C is around 8 per pt. Pt will also eat "too much fruit" suggested pairing that with protein/fat and spreading it out.      Intervention Plan   Intervention  Prescribe, educate and counsel regarding individualized specific dietary modifications aiming towards targeted core components such as weight, hypertension, lipid management, diabetes, heart failure and other comorbidities.    Expected Outcomes  Short Term Goal: A plan has been developed with personal nutrition goals set during dietitian appointment.;Long Term Goal: Adherence to prescribed  nutrition plan.;Short Term Goal: Understand basic principles of dietary content, such as calories, fat, sodium, cholesterol and nutrients.       Nutrition Assessments: Nutrition Assessments - 08/30/18 0724      MEDFICTS Scores   Pre Score  29       Nutrition Goals Re-Evaluation:   Nutrition Goals Discharge (Final Nutrition Goals Re-Evaluation):   Psychosocial: Target Goals: Acknowledge presence or absence of significant depression and/or stress, maximize coping skills, provide positive support system. Participant is able to verbalize types and ability to use techniques and skills needed for reducing  stress and depression.   Initial Review & Psychosocial Screening: Initial Psych Review & Screening - 08/29/18 1214      Initial Review   Current issues with  None Identified      Family Dynamics   Good Support System?  Yes   Girl friend and daughter, and friends     Barriers   Psychosocial barriers to participate in program  There are no identifiable barriers or psychosocial needs.;The patient should benefit from training in stress management and relaxation.      Screening Interventions   Interventions  Provide feedback about the scores to participant;To provide support and resources with identified psychosocial needs;Encouraged to exercise    Expected Outcomes  Short Term goal: Utilizing psychosocial counselor, staff and physician to assist with identification of specific Stressors or current issues interfering with healing process. Setting desired goal for each stressor or current issue identified.;Long Term Goal: Stressors or current issues are controlled or eliminated.;Short Term goal: Identification and review with participant of any Quality of Life or Depression concerns found by scoring the questionnaire.;Long Term goal: The participant improves quality of Life and PHQ9 Scores as seen by post scores and/or verbalization of changes       Quality of Life Scores:  Quality of Life - 08/30/18 0723      Quality of Life   Select  Quality of Life      Quality of Life Scores   Health/Function Pre  30 %    Socioeconomic Pre  30 %    Psych/Spiritual Pre  30 %    Family Pre  30 %    GLOBAL Pre  30 %      Scores of 19 and below usually indicate a poorer quality of life in these areas.  A difference of  2-3 points is a clinically meaningful difference.  A difference of 2-3 points in the total score of the Quality of Life Index has been associated with significant improvement in overall quality of life, self-image, physical symptoms, and general health in studies assessing change in  quality of life.  PHQ-9: Recent Review Flowsheet Data    Depression screen [facility] 2/9 06/21/2018 10/26/2017 07/25/2017   Decreased Interest 0 0 0   Down, Depressed, Hopeless 0 0 0   PHQ - 2 Score 0 0 0   Altered sleeping 0 - 2   Tired, decreased energy 0 - 0   Change in appetite 0 - 0   Feeling bad or failure about yourself  0 - 0   Trouble concentrating 0 - 0   Moving slowly or fidgety/restless 0 - 0   Suicidal thoughts 0 - 0   PHQ-9 Score 0 - 2   Difficult doing work/chores Not difficult at all - Not difficult at all     Interpretation of Total Score  Total Score Depression Severity:  1-4 = Minimal depression, 5-9 = Mild depression, 10-14 =  Moderate depression, 15-19 = Moderately severe depression, 20-27 = Severe depression   Psychosocial Evaluation and Intervention: Psychosocial Evaluation - 08/29/18 1225      Psychosocial Evaluation & Interventions   Interventions  Encouraged to exercise with the program and follow exercise prescription    Comments  Cuinn is doing well with no barriers reported. He lives in a new house with his daughter. He will complete the questionnaires when he enrolls in [place] as soon as he get the MyChart activation email I just sent to him. He does want to lose some weight and to quit the tobacco habit.    Expected Outcomes  Daniil will attend the Cardiac Rehab program on a regular basis. Working on his risk factor control goals of weight management and tobacco cessation.    Continue Psychosocial Services   Follow up required by staff       Psychosocial Re-Evaluation:   Psychosocial Discharge (Final Psychosocial Re-Evaluation):   Vocational Rehabilitation: Provide vocational rehab assistance to qualifying candidates.   Vocational Rehab Evaluation & Intervention: Vocational Rehab - 08/29/18 1220      Initial Vocational Rehab Evaluation & Intervention   Assessment shows need for Vocational Rehabilitation  No       Education: Education Goals:  Education classes will be provided on a variety of topics geared toward better understanding of heart health and risk factor modification. Participant will state understanding/return demonstration of topics presented as noted by education test scores.  Learning Barriers/Preferences: Learning Barriers/Preferences - 08/29/18 1205      Learning Barriers/Preferences   Learning Barriers  Reading    Learning Preferences  Individual Instruction;Verbal Instruction       Education Topics:  AED/CPR: - Group verbal and written instruction with the use of models to demonstrate the basic use of the AED with the basic ABC's of resuscitation.   General Nutrition Guidelines/Fats and Fiber: -Group instruction provided by verbal, written material, models and posters to present the general guidelines for heart healthy nutrition. Gives an explanation and review of dietary fats and fiber.   Controlling Sodium/Reading Food Labels: -Group verbal and written material supporting the discussion of sodium use in heart healthy nutrition. Review and explanation with models, verbal and written materials for utilization of the food label.   Exercise Physiology & General Exercise Guidelines: - Group verbal and written instruction with models to review the exercise physiology of the cardiovascular system and associated critical values. Provides general exercise guidelines with specific guidelines to those with heart or lung disease.    Aerobic Exercise & Resistance Training: - Gives group verbal and written instruction on the various components of exercise. Focuses on aerobic and resistive training programs and the benefits of this training and how to safely progress through these programs..   Flexibility, Balance, Mind/Body Relaxation: Provides group verbal/written instruction on the benefits of flexibility and balance training, including mind/body exercise modes such as yoga, pilates and tai chi.  Demonstration  and skill practice provided.   Stress and Anxiety: - Provides group verbal and written instruction about the health risks of elevated stress and causes of high stress.  Discuss the correlation between heart/lung disease and anxiety and treatment options. Review healthy ways to manage with stress and anxiety.   Depression: - Provides group verbal and written instruction on the correlation between heart/lung disease and depressed mood, treatment options, and the stigmas associated with seeking treatment.   Anatomy & Physiology of the Heart: - Group verbal and written instruction and models provide basic  cardiac anatomy and physiology, with the coronary electrical and arterial systems. Review of Valvular disease and Heart Failure   Cardiac Procedures: - Group verbal and written instruction to review commonly prescribed medications for heart disease. Reviews the medication, class of the drug, and side effects. Includes the steps to properly store meds and maintain the prescription regimen. (beta blockers and nitrates)   Cardiac Medications I: - Group verbal and written instruction to review commonly prescribed medications for heart disease. Reviews the medication, class of the drug, and side effects. Includes the steps to properly store meds and maintain the prescription regimen.   Cardiac Medications II: -Group verbal and written instruction to review commonly prescribed medications for heart disease. Reviews the medication, class of the drug, and side effects. (all other drug classes)    Go Sex-Intimacy & Heart Disease, Get SMART - Goal Setting: - Group verbal and written instruction through game format to discuss heart disease and the return to sexual intimacy. Provides group verbal and written material to discuss and apply goal setting through the application of the S.M.A.R.T. Method.   Other Matters of the Heart: - Provides group verbal, written materials and models to describe  Stable Angina and Peripheral Artery. Includes description of the disease process and treatment options available to the cardiac patient.   Exercise & Equipment Safety: - Individual verbal instruction and demonstration of equipment use and safety with use of the equipment.   Cardiac Rehab from 09/04/2018 in [facility] Cardiac and Pulmonary Rehab  Date  09/04/18  Educator  [place]  Instruction Review Code  1- Verbalizes Understanding      Infection Prevention: - Provides verbal and written material to individual with discussion of infection control including proper hand washing and proper equipment cleaning during exercise session.   Cardiac Rehab from 09/04/2018 in [facility] Cardiac and Pulmonary Rehab  Date  09/04/18  Educator  [place]  Instruction Review Code  1- Verbalizes Understanding      Falls Prevention: - Provides verbal and written material to individual with discussion of falls prevention and safety.   Cardiac Rehab from 09/04/2018 in [facility]  Date  09/04/18  Educator  [place]  Instruction Review Code  1- Verbalizes Understanding      Diabetes: - Individual verbal and written instruction to review signs/symptoms of diabetes, desired ranges of glucose level fasting, after meals and with exercise. Acknowledge that pre and post exercise glucose checks will be done for 3 sessions at entry of program.   Cardiac Rehab from 09/04/2018 in [facility] Cardiac [facility] Pul[facility] Rehab  Date  09/04/18  Educator  [place]  Instruction Review Code  1- Verbalizes Understanding      Know Your Numbers and Risk Factors: -Group verbal and written instruction about important numbers in your health.  Discussion of what are risk factors and how they play a role in the disease process.  Review of Cholesterol, Blood Pressure, Diabetes, and BMI and the role they play in your overall health.   Sleep Hygiene: -Provides group verbal and written instruction about how sleep can affect your health.  Define sleep  hygiene, discuss sleep cycles and impact of sleep habits. Review good sleep hygiene tips.    Other: -Provides group and verbal instruction on various topics (see comments)   Knowledge Questionnaire Score: Knowledge Questionnaire Score - 08/30/18 0724      Knowledge Questionnaire Score   Pre Score  16/26  Missed Heart Attack, angina, PAD, Heart Failure, Nutrition and Exercise questions  Core Components/Risk Factors/Patient Goals at Admission: Personal Goals and Risk Factors at Admission - 08/29/18 1221      Core Components/Risk Factors/Patient Goals on Admission    Weight Management  Yes;Weight Loss    Intervention  Weight Management: Develop a combined nutrition and exercise program designed to reach desired caloric intake, while maintaining appropriate intake of nutrient and fiber, sodium and fats, and appropriate energy expenditure required for the weight goal.;Weight Management: Provide education and appropriate resources to help participant work on and attain dietary goals.;Weight Management/Obesity: Establish reasonable short term and long term weight goals.    Expected Outcomes  Short Term: Continue to assess and modify interventions until short term weight is achieved;Long Term: Adherence to nutrition and physical activity/exercise program aimed toward attainment of established weight goal;Weight Loss: Understanding of general recommendations for a balanced deficit meal plan, which promotes 1-2 lb weight loss per week and includes a negative energy balance of [phone] kcal/d    Tobacco Cessation  Yes    Number of packs per day  uses dip   only 1 dip a day   Wants to Quit    Intervention  Assist the participant in steps to quit. Provide individualized education and counseling about committing to Tobacco Cessation, relapse prevention, and pharmacological support that can be provided by physician.;Advice worker, assist with locating and accessing local/national Quit  Smoking programs, and support quit date choice.    Expected Outcomes  Short Term: Will demonstrate readiness to quit, by selecting a quit date.;Short Term: Will quit all tobacco product use, adhering to prevention of relapse plan.;Long Term: Complete abstinence from all tobacco products for at least 12 months from quit date.    Diabetes  Yes    Intervention  Provide education about signs/symptoms and action to take for hypo/hyperglycemia.;Provide education about proper nutrition, including hydration, and aerobic/resistive exercise prescription along with prescribed medications to achieve blood glucose in normal ranges: Fasting glucose 65-99 mg/dL    Expected Outcomes  Short Term: Participant verbalizes understanding of the signs/symptoms and immediate care of hyper/hypoglycemia, proper foot care and importance of medication, aerobic/resistive exercise and nutrition plan for blood glucose control.;Long Term: Attainment of HbA1C < 7%.    Hypertension  Yes    Intervention  Provide education on lifestyle modifcations including regular physical activity/exercise, weight management, moderate sodium restriction and increased consumption of fresh fruit, vegetables, and low fat dairy, alcohol moderation, and smoking cessation.;Monitor prescription use compliance.    Expected Outcomes  Short Term: Continued assessment and intervention until BP is < 140/82m HG in hypertensive participants. < 130/875mHG in hypertensive participants with diabetes, heart failure or chronic kidney disease.;Long Term: Maintenance of blood pressure at goal levels.    Lipids  Yes    Intervention  Provide education and support for participant on nutrition & aerobic/resistive exercise along with prescribed medications to achieve LDL '70mg'$ , HDL >'40mg'$ .    Expected Outcomes  Short Term: Participant states understanding of desired cholesterol values and is compliant with medications prescribed. Participant is following exercise prescription and  nutrition guidelines.;Long Term: Cholesterol controlled with medications as prescribed, with individualized exercise RX and with personalized nutrition plan. Value goals: LDL < '70mg'$ , HDL > 40 mg.       Core Components/Risk Factors/Patient Goals Review:    Core Components/Risk Factors/Patient Goals at Discharge (Final Review):    ITP Comments: ITP Comments    Row Name 08/29/18 1230 09/06/18 1620 09/12/18 0604       ITP Comments  Virtual  Orientation completed today   Documentation of the Diagnosis can be found in [facility] 7/16.  First full day of exercise!  Patient was oriented to gym and equipment including functions, settings, policies, and procedures.  Patient's individual exercise prescription and treatment plan were reviewed.  All starting workloads were established based on the results of the 6 minute walk test done at initial orientation visit.  The plan for exercise progression was also introduced and progression will be customized based on patient's performance and goals.  30 Day Review Completed today. Continue with ITP unless changed by Medical Director review.  New to program        Comments:

## 2018-09-14 ENCOUNTER — Other Ambulatory Visit: Payer: Self-pay | Admitting: Family Medicine

## 2018-09-17 ENCOUNTER — Other Ambulatory Visit: Payer: Self-pay | Admitting: Family Medicine

## 2018-09-19 ENCOUNTER — Other Ambulatory Visit: Payer: Self-pay | Admitting: Family Medicine

## 2018-09-19 DIAGNOSIS — J449 Chronic obstructive pulmonary disease, unspecified: Secondary | ICD-10-CM | POA: Diagnosis not present

## 2018-09-19 DIAGNOSIS — E1142 Type 2 diabetes mellitus with diabetic polyneuropathy: Secondary | ICD-10-CM | POA: Diagnosis not present

## 2018-09-19 DIAGNOSIS — L84 Corns and callosities: Secondary | ICD-10-CM | POA: Diagnosis not present

## 2018-09-19 DIAGNOSIS — J45909 Unspecified asthma, uncomplicated: Secondary | ICD-10-CM | POA: Diagnosis not present

## 2018-09-19 DIAGNOSIS — I739 Peripheral vascular disease, unspecified: Secondary | ICD-10-CM | POA: Diagnosis not present

## 2018-09-19 DIAGNOSIS — B351 Tinea unguium: Secondary | ICD-10-CM | POA: Diagnosis not present

## 2018-09-19 DIAGNOSIS — L853 Xerosis cutis: Secondary | ICD-10-CM | POA: Diagnosis not present

## 2018-09-20 ENCOUNTER — Telehealth: Payer: Self-pay | Admitting: *Deleted

## 2018-09-20 ENCOUNTER — Encounter: Payer: Self-pay | Admitting: *Deleted

## 2018-09-20 DIAGNOSIS — Z955 Presence of coronary angioplasty implant and graft: Secondary | ICD-10-CM

## 2018-09-20 NOTE — Progress Notes (Signed)
Cardiac Individual Treatment Plan  Patient Details  Name: Corey [name] MRN: [id number] Date of Birth: 11-22-1955 Referring Provider:     Cardiac Rehab from 09/04/2018 in [facility] Cardiac and Pulmonary Rehab  Referring Provider  [facility]      Initial Encounter Date:    Cardiac Rehab from 09/04/2018 in [facility] Cardiac and Pulmonary Rehab  Date  09/04/18      Visit Diagnosis: Status post coronary artery stent placement  Patient's Home Medications on Admission:  Current Outpatient Medications:  .  ACCU-CHEK FASTCLIX LANCETS MISC, USE TO CHECK BLOOD SUGAR, Disp: , Rfl: 12 .  ACCU-CHEK GUIDE test strip, USE TO CHECK BLOOD SUGAR TID, Disp: 100 each, Rfl: 12 .  acetaminophen (TYLENOL) 500 MG tablet, Take 1,000 mg by mouth every 6 (six) hours as needed for moderate pain or headache., Disp: , Rfl:  .  albuterol (PROVENTIL) (2.5 MG/3ML) 0.083% nebulizer solution, Take 3 mLs (2.5 mg total) by nebulization every 6 (six) hours as needed for wheezing or shortness of breath., Disp: 150 mL, Rfl: 1 .  albuterol (VENTOLIN HFA) 108 (90 Base) MCG/ACT inhaler, INHALE 2 PUFFS INTO THE LUNGS EVERY 6 HOURS AS NEEDED FOR WHEEZING OR SHORTNESS OF BREATH, Disp: 25.5 g, Rfl: 0 .  amitriptyline (ELAVIL) 50 MG tablet, Take 1-2 tablets (50-100 mg total) by mouth at bedtime., Disp: 180 tablet, Rfl: 1 .  ammonium lactate (AMLACTIN) 12 % lotion, Apply to affected area twice daily as needed (Patient taking differently: Apply 1 application topically daily. ), Disp: 396 g, Rfl: 1 .  aspirin EC 81 MG tablet, Take 81 mg by mouth daily., Disp: , Rfl:  .  azelastine (ASTELIN) 0.1 % nasal spray, Place 2 sprays into both nostrils 2 (two) times daily. Use in each nostril as directed, Disp: 30 mL, Rfl: 6 .  B-D UF III MINI PEN NEEDLES 31G X 5 MM MISC, USE TWICE DAILY, Disp: 100 each, Rfl: 11 .  Blood Glucose Monitoring Suppl (ACCU-CHEK GUIDE) w/Device KIT, U UTD, Disp: , Rfl: 0 .  cetirizine (ZYRTEC) 10 MG tablet, Take 1 tablet (10 mg  total) by mouth daily., Disp: 90 tablet, Rfl: 1 .  Cholecalciferol (VITAMIN D3) 5000 units TABS, Take 5,000 Units by mouth daily. , Disp: , Rfl:  .  citalopram (CELEXA) 10 MG tablet, TAKE 1 TABLET(10 MG) BY MOUTH DAILY (Patient taking differently: Take 10 mg by mouth daily. ), Disp: 90 tablet, Rfl: 1 .  diclofenac sodium (VOLTAREN) 1 % GEL, Apply 1 application topically 4 (four) times daily as needed (pain). , Disp: , Rfl:  .  Fluticasone-Salmeterol (ADVAIR) 500-50 MCG/DOSE AEPB, Inhale 1 puff into the lungs 2 (two) times daily., Disp: 1 each, Rfl: 6 .  furosemide (LASIX) 20 MG tablet, TAKE 1 TABLET(20 MG) BY MOUTH TWICE DAILY, Disp: 180 tablet, Rfl: 1 .  GLYXAMBI 25-5 MG TABS, TAKE 1 TABLET BY MOUTH EVERY MORNING, Disp: 90 tablet, Rfl: 1 .  Insulin Syringe-Needle U-100 30G X 1/2" 1 ML MISC, 1 Units by Does not apply route daily., Disp: 90 each, Rfl: 3 .  Lancets Misc. (ACCU-CHEK FASTCLIX LANCET) KIT, 1 Units by Does not apply route 2 (two) times daily., Disp: 3 kit, Rfl: 3 .  LANTUS SOLOSTAR 100 UNIT/ML Solostar Pen, INJECT 75 UNITS [name], Disp: 45 mL, Rfl: 11 .  lisinopril (ZESTRIL) 10 MG tablet, TAKE 1 TABLET(10 MG) BY MOUTH DAILY (Patient taking differently: Take 10 mg by mouth daily. ), Disp: 90 tablet, Rfl: 1 .  magnesium  gluconate (MAGONATE) 500 MG tablet, Take 500 mg by mouth daily., Disp: , Rfl:  .  meloxicam (MOBIC) 15 MG tablet, Take 1 tablet (15 mg total) by mouth daily as needed for pain. TAKE 1 TABLET(15 MG) BY MOUTH DAILY, Disp: 90 tablet, Rfl: 0 .  metFORMIN (GLUCOPHAGE) 1000 MG tablet, Take 1 tablet (1,000 mg total) by mouth 2 (two) times daily., Disp: 180 tablet, Rfl: 1 .  montelukast (SINGULAIR) 10 MG tablet, TAKE 1 TABLET(10 MG) BY MOUTH DAILY (Patient taking differently: Take 10 mg by mouth daily. ), Disp: 90 tablet, Rfl: 1 .  OZEMPIC, 0.25 OR 0.5 MG/DOSE, 2 MG/1.5ML SOPN, INJECT 0.25 MG UNDER THE SKIN ONCE PER WEEK (Patient not taking: Reported on 08/29/2018), Disp: 1.5 mL, Rfl:  3 .  OZEMPIC, 1 MG/DOSE, 2 MG/1.5ML SOPN, Inject 1 mg as directed every Friday., Disp: , Rfl:  .  pantoprazole (PROTONIX) 40 MG tablet, Take 1 tablet (40 mg total) by mouth daily., Disp: 30 tablet, Rfl: 3 .  pregabalin (LYRICA) 100 MG capsule, TAKE 1 CAPSULE(100 MG) BY MOUTH TWICE DAILY, Disp: 180 capsule, Rfl: 1 .  primidone (MYSOLINE) 50 MG tablet, TAKE 2 TABLETS(100 MG) BY MOUTH DAILY, Disp: 180 tablet, Rfl: 1 .  rosuvastatin (CRESTOR) 20 MG tablet, TAKE 1 TABLET(20 MG) BY MOUTH DAILY (Patient taking differently: Take 20 mg by mouth at bedtime. ), Disp: 90 tablet, Rfl: 1 .  sucralfate (CARAFATE) 1 g tablet, Take 1 tablet (1 g total) by mouth 3 (three) times daily as needed., Disp: 90 tablet, Rfl: 0  Past Medical History: Past Medical History:  Diagnosis Date  . Asthma   . Diabetes ([place])   . Hyperlipidemia   . Hypertension   . Legg-Perthes disease     Tobacco Use: Social History   Tobacco Use  Smoking Status Former Smoker  . Packs/day: 0.50  . Types: Cigarettes  Smokeless Tobacco Current User  . Types: Chew  Tobacco Comment   quit 40 years ago     Labs: Recent Review Flowsheet Data    Labs for ITP Cardiac and Pulmonary Rehab Latest Ref Rng & Units 04/21/2017 07/25/2017 10/26/2017 01/29/2018 03/02/2018   Cholestrol <200 mg/dL - 167 - 157 -   LDLCALC 0 - 99 mg/dL - 73 - - -   HDL >39 mg/dL - 38(L) - - -   Trlycerides <150 mg/dL - 282(H) - 292(H) -   Hemoglobin A1c <7.0 % 7.1(H) 9.2(H) 9.0(H) 8.8(H) 9.7(H)       Exercise Target Goals: Exercise Program Goal: Individual exercise prescription set using results from initial 6 min walk test and THRR while considering  patient's activity barriers and safety.   Exercise Prescription Goal: Initial exercise prescription builds to 30-45 minutes a day of aerobic activity, 2-3 days per week.  Home exercise guidelines will be given to patient during program as part of exercise prescription that the participant will  acknowledge.  Activity Barriers & Risk Stratification: Activity Barriers & Cardiac Risk Stratification - 08/29/18 1214      Activity Barriers & Cardiac Risk Stratification   Activity Barriers  None    Cardiac Risk Stratification  Moderate       6 Minute Walk: 6 Minute Walk    Row Name 09/04/18 1540         6 Minute Walk   Phase  Initial     Distance  1342 feet     Walk Time  6 minutes     # of Rest Breaks  0     MPH  2.54     METS  3.52     RPE  11     Perceived Dyspnea   0     VO2 Peak  12.33     Resting HR  82 bpm     Resting BP  114/64     Max Ex. HR  128 bpm     Max Ex. BP  142/70     2 Minute Post BP  130/70        Oxygen Initial Assessment:   Oxygen Re-Evaluation:   Oxygen Discharge (Final Oxygen Re-Evaluation):   Initial Exercise Prescription: Initial Exercise Prescription - 09/04/18 1500      Date of Initial Exercise RX and Referring Provider   Date  09/04/18    Referring Provider  [facility]      Treadmill   MPH  2.2    Grade  1    Minutes  15    METs  3      NuStep   Level  2    SPM  80    Minutes  15    METs  3      Arm Ergometer   Level  1    Watts  10    Minutes  15    METs  2      Biostep-RELP   Level  2    SPM  50    Minutes  15    METs  2      Prescription Details   Frequency (times per week)  3    Duration  Progress to 30 minutes of continuous aerobic without signs/symptoms of physical distress      Intensity   THRR 40-80% of Max Heartrate  112-143    Ratings of Perceived Exertion  11-15    Perceived Dyspnea  0-4      Progression   Progression  Continue progressive overload as per policy without signs/symptoms or physical distress.      Resistance Training   Training Prescription  Yes    Weight  3    Reps  10-15       Perform Capillary Blood Glucose checks as needed.  Exercise Prescription Changes: Exercise Prescription Changes    Row Name 09/04/18 1500             Response to Exercise   Blood  Pressure (Admit)  114/64       Blood Pressure (Exercise)  142/70       Blood Pressure (Exit)  130/70       Heart Rate (Admit)  82 bpm       Heart Rate (Exercise)  128 bpm       Heart Rate (Exit)  80 bpm       Rating of Perceived Exertion (Exercise)  11       Perceived Dyspnea (Exercise)  0       Symptoms  no       Duration  Progress to 30 minutes of  aerobic without signs/symptoms of physical distress       Intensity  THRR unchanged         Progression   Progression  Continue to progress workloads to maintain intensity without signs/symptoms of physical distress.          Exercise Comments:   Exercise Goals and Review: Exercise Goals    Row Name 09/04/18 1546  Exercise Goals   Increase Physical Activity  Yes       Intervention  Provide advice, education, support and counseling about physical activity/exercise needs.;Develop an individualized exercise prescription for aerobic and resistive training based on initial evaluation findings, risk stratification, comorbidities and participant's personal goals.       Expected Outcomes  Short Term: Attend rehab on a regular basis to increase amount of physical activity.;Long Term: Add in home exercise to make exercise part of routine and to increase amount of physical activity.;Long Term: Exercising regularly at least 3-5 days a week.       Increase Strength and Stamina  Yes       Intervention  Provide advice, education, support and counseling about physical activity/exercise needs.;Develop an individualized exercise prescription for aerobic and resistive training based on initial evaluation findings, risk stratification, comorbidities and participant's personal goals.       Expected Outcomes  Short Term: Increase workloads from initial exercise prescription for resistance, speed, and METs.;Short Term: Perform resistance training exercises routinely during rehab and add in resistance training at home;Long Term: Improve  cardiorespiratory fitness, muscular endurance and strength as measured by increased METs and functional capacity (6MWT)       Able to understand and use rate of perceived exertion (RPE) scale  Yes       Intervention  Provide education and explanation on how to use RPE scale       Expected Outcomes  Short Term: Able to use RPE daily in rehab to express subjective intensity level;Long Term:  Able to use RPE to guide intensity level when exercising independently       Able to understand and use Dyspnea scale  Yes       Intervention  Provide education and explanation on how to use Dyspnea scale       Expected Outcomes  Short Term: Able to use Dyspnea scale daily in rehab to express subjective sense of shortness of breath during exertion;Long Term: Able to use Dyspnea scale to guide intensity level when exercising independently       Knowledge and understanding of Target Heart Rate Range (THRR)  Yes       Intervention  Provide education and explanation of THRR including how the numbers were predicted and where they are located for reference       Expected Outcomes  Short Term: Able to state/look up THRR;Long Term: Able to use THRR to govern intensity when exercising independently;Short Term: Able to use daily as guideline for intensity in rehab       Able to check pulse independently  Yes       Intervention  Provide education and demonstration on how to check pulse in carotid and radial arteries.;Review the importance of being able to check your own pulse for safety during independent exercise       Expected Outcomes  Short Term: Able to explain why pulse checking is important during independent exercise;Long Term: Able to check pulse independently and accurately       Understanding of Exercise Prescription  Yes       Intervention  Provide education, explanation, and written materials on patient's individual exercise prescription       Expected Outcomes  Short Term: Able to explain program exercise  prescription;Long Term: Able to explain home exercise prescription to exercise independently          Exercise Goals Re-Evaluation : Exercise Goals Re-Evaluation    [place] Name 09/06/18 1620  Exercise Goal Re-Evaluation   Exercise Goals Review  Able to understand and use rate of perceived exertion (RPE) scale;Increase Physical Activity;Knowledge and understanding of Target Heart Rate Range (THRR);Understanding of Exercise Prescription;Increase Strength and Stamina;Able to check pulse independently       Comments  Reviewed RPE scale, THR and program prescription with pt today.  Pt voiced understanding and was given a copy of goals to take home.       Expected Outcomes  Short: Use RPE daily to regulate intensity. Long: Follow program prescription in THR.          Discharge Exercise Prescription (Final Exercise Prescription Changes): Exercise Prescription Changes - 09/04/18 1500      Response to Exercise   Blood Pressure (Admit)  114/64    Blood Pressure (Exercise)  142/70    Blood Pressure (Exit)  130/70    Heart Rate (Admit)  82 bpm    Heart Rate (Exercise)  128 bpm    Heart Rate (Exit)  80 bpm    Rating of Perceived Exertion (Exercise)  11    Perceived Dyspnea (Exercise)  0    Symptoms  no    Duration  Progress to 30 minutes of  aerobic without signs/symptoms of physical distress    Intensity  THRR unchanged      Progression   Progression  Continue to progress workloads to maintain intensity without signs/symptoms of physical distress.       Nutrition:  Target Goals: Understanding of nutrition guidelines, daily intake of sodium <1587m, cholesterol <2010m calories 30% from fat and 7% or less from saturated fats, daily to have 5 or more servings of fruits and vegetables.  Biometrics: Pre Biometrics - 09/04/18 1542      Pre Biometrics   Height  5' 8"  (1.727 m)    Weight  217 lb 8 oz (98.7 kg)    BMI (Calculated)  33.08    Single Leg Stand  14.86 seconds         Nutrition Therapy Plan and Nutrition Goals: Nutrition Therapy & Goals - 09/04/18 1548      Nutrition Therapy   Diet  Low Na, HH, DM diet    Protein (specify units)  80g    Fiber  30 grams    Whole Grain Foods  3 servings    Saturated Fats  12 max. grams    Fruits and Vegetables  5 servings/day    Sodium  1.5 grams      Personal Nutrition Goals   Nutrition Goal  ST: add protein/fat to fruit and spread out fruit consumption LT: walk better (breathing is hard)    Comments  Unable to review pt handout as pt reading literacy issues, discussed HH and DM friendly eating. Pt didn't give much in food frequency; mentions he eats the "stuff I know I shouldn't". A1C is around 8 per pt. Pt will also eat "too much fruit" suggested pairing that with protein/fat and spreading it out.      Intervention Plan   Intervention  Prescribe, educate and counsel regarding individualized specific dietary modifications aiming towards targeted core components such as weight, hypertension, lipid management, diabetes, heart failure and other comorbidities.    Expected Outcomes  Short Term Goal: A plan has been developed with personal nutrition goals set during dietitian appointment.;Long Term Goal: Adherence to prescribed nutrition plan.;Short Term Goal: Understand basic principles of dietary content, such as calories, fat, sodium, cholesterol and nutrients.       Nutrition Assessments:  Nutrition Assessments - 08/30/18 0724      MEDFICTS Scores   Pre Score  29       Nutrition Goals Re-Evaluation:   Nutrition Goals Discharge (Final Nutrition Goals Re-Evaluation):   Psychosocial: Target Goals: Acknowledge presence or absence of significant depression and/or stress, maximize coping skills, provide positive support system. Participant is able to verbalize types and ability to use techniques and skills needed for reducing stress and depression.   Initial Review & Psychosocial Screening: Initial Psych  Review & Screening - 08/29/18 1214      Initial Review   Current issues with  None Identified      Family Dynamics   Good Support System?  Yes   Girl friend and daughter, and friends     Barriers   Psychosocial barriers to participate in program  There are no identifiable barriers or psychosocial needs.;The patient should benefit from training in stress management and relaxation.      Screening Interventions   Interventions  Provide feedback about the scores to participant;To provide support and resources with identified psychosocial needs;Encouraged to exercise    Expected Outcomes  Short Term goal: Utilizing psychosocial counselor, staff and physician to assist with identification of specific Stressors or current issues interfering with healing process. Setting desired goal for each stressor or current issue identified.;Long Term Goal: Stressors or current issues are controlled or eliminated.;Short Term goal: Identification and review with participant of any Quality of Life or Depression concerns found by scoring the questionnaire.;Long Term goal: The participant improves quality of Life and PHQ9 Scores as seen by post scores and/or verbalization of changes       Quality of Life Scores:  Quality of Life - 08/30/18 0723      Quality of Life   Select  Quality of Life      Quality of Life Scores   Health/Function Pre  30 %    Socioeconomic Pre  30 %    Psych/Spiritual Pre  30 %    Family Pre  30 %    GLOBAL Pre  30 %      Scores of 19 and below usually indicate a poorer quality of life in these areas.  A difference of  2-3 points is a clinically meaningful difference.  A difference of 2-3 points in the total score of the Quality of Life Index has been associated with significant improvement in overall quality of life, self-image, physical symptoms, and general health in studies assessing change in quality of life.  PHQ-9: Recent Review Flowsheet Data    Depression screen [facility] 2/9  06/21/2018 10/26/2017 07/25/2017   Decreased Interest 0 0 0   Down, Depressed, Hopeless 0 0 0   PHQ - 2 Score 0 0 0   Altered sleeping 0 - 2   Tired, decreased energy 0 - 0   Change in appetite 0 - 0   Feeling bad or failure about yourself  0 - 0   Trouble concentrating 0 - 0   Moving slowly or fidgety/restless 0 - 0   Suicidal thoughts 0 - 0   PHQ-9 Score 0 - 2   Difficult doing work/chores Not difficult at all - Not difficult at all     Interpretation of Total Score  Total Score Depression Severity:  1-4 = Minimal depression, 5-9 = Mild depression, 10-14 = Moderate depression, 15-19 = Moderately severe depression, 20-27 = Severe depression   Psychosocial Evaluation and Intervention: Psychosocial Evaluation - 08/29/18 1225  Psychosocial Evaluation & Interventions   Interventions  Encouraged to exercise with the program and follow exercise prescription    Comments  Corey [name] is doing well with no barriers reported. He lives in a new house with his daughter. He will complete the questionnaires when he enrolls in [facility] as soon as he get the MyChart activation email I just sent to him. He does want to lose some weight and to quit the tobacco habit.    Expected Outcomes  Corey [name] will attend the Cardiac Rehab program on a regular basis. Working on his risk factor control goals of weight management and tobacco cessation.    Continue Psychosocial Services   Follow up required by staff       Psychosocial Re-Evaluation:   Psychosocial Discharge (Final Psychosocial Re-Evaluation):   Vocational Rehabilitation: Provide vocational rehab assistance to qualifying candidates.   Vocational Rehab Evaluation & Intervention: Vocational Rehab - 08/29/18 1220      Initial Vocational Rehab Evaluation & Intervention   Assessment shows need for Vocational Rehabilitation  No       Education: Education Goals: Education classes will be provided on a variety of topics geared toward better  understanding of heart health and risk factor modification. Participant will state understanding/return demonstration of topics presented as noted by education test scores.  Learning Barriers/Preferences: Learning Barriers/Preferences - 08/29/18 1205      Learning Barriers/Preferences   Learning Barriers  Reading    Learning Preferences  Individual Instruction;Verbal Instruction       Education Topics:  AED/CPR: - Group verbal and written instruction with the use of models to demonstrate the basic use of the AED with the basic ABC's of resuscitation.   General Nutrition Guidelines/Fats and Fiber: -Group instruction provided by verbal, written material, models and posters to present the general guidelines for heart healthy nutrition. Gives an explanation and review of dietary fats and fiber.   Controlling Sodium/Reading Food Labels: -Group verbal and written material supporting the discussion of sodium use in heart healthy nutrition. Review and explanation with models, verbal and written materials for utilization of the food label.   Exercise Physiology & General Exercise Guidelines: - Group verbal and written instruction with models to review the exercise physiology of the cardiovascular system and associated critical values. Provides general exercise guidelines with specific guidelines to those with heart or lung disease.    Aerobic Exercise & Resistance Training: - Gives group verbal and written instruction on the various components of exercise. Focuses on aerobic and resistive training programs and the benefits of this training and how to safely progress through these programs..   Flexibility, Balance, Mind/Body Relaxation: Provides group verbal/written instruction on the benefits of flexibility and balance training, including mind/body exercise modes such as yoga, pilates and tai chi.  Demonstration and skill practice provided.   Stress and Anxiety: - Provides group verbal  and written instruction about the health risks of elevated stress and causes of high stress.  Discuss the correlation between heart/lung disease and anxiety and treatment options. Review healthy ways to manage with stress and anxiety.   Depression: - Provides group verbal and written instruction on the correlation between heart/lung disease and depressed mood, treatment options, and the stigmas associated with seeking treatment.   Anatomy & Physiology of the Heart: - Group verbal and written instruction and models provide basic cardiac anatomy and physiology, with the coronary electrical and arterial systems. Review of Valvular disease and Heart Failure   Cardiac Procedures: - Group verbal and written  instruction to review commonly prescribed medications for heart disease. Reviews the medication, class of the drug, and side effects. Includes the steps to properly store meds and maintain the prescription regimen. (beta blockers and nitrates)   Cardiac Medications I: - Group verbal and written instruction to review commonly prescribed medications for heart disease. Reviews the medication, class of the drug, and side effects. Includes the steps to properly store meds and maintain the prescription regimen.   Cardiac Medications II: -Group verbal and written instruction to review commonly prescribed medications for heart disease. Reviews the medication, class of the drug, and side effects. (all other drug classes)    Go Sex-Intimacy & Heart Disease, Get SMART - Goal Setting: - Group verbal and written instruction through game format to discuss heart disease and the return to sexual intimacy. Provides group verbal and written material to discuss and apply goal setting through the application of the S.M.A.R.T. Method.   Other Matters of the Heart: - Provides group verbal, written materials and models to describe Stable Angina and Peripheral Artery. Includes description of the disease process  and treatment options available to the cardiac patient.   Exercise & Equipment Safety: - Individual verbal instruction and demonstration of equipment use and safety with use of the equipment.   Cardiac Rehab from 09/04/2018 in [facility] Cardiac and Pulmonary Rehab  Date  09/04/18  Educator  [place]  Instruction Review Code  1- Verbalizes Understanding      Infection Prevention: - Provides verbal and written material to individual with discussion of infection control including proper hand washing and proper equipment cleaning during exercise session.   Cardiac Rehab from 09/04/2018 in [facility] Cardiac and Pulmonary Rehab  Date  09/04/18  Educator  [place]  Instruction Review Code  1- Verbalizes Understanding      Falls Prevention: - Provides verbal and written material to individual with discussion of falls prevention and safety.   Cardiac Rehab from 09/04/2018 in [facility] Cardiac and Pulmonary Rehab  Date  09/04/18  Educator  [place]  Instruction Review Code  1- Verbalizes Understanding      Diabetes: - Individual verbal and written instruction to review signs/symptoms of diabetes, desired ranges of glucose level fasting, after meals and with exercise. Acknowledge that pre and post exercise glucose checks will be done for 3 sessions at entry of program.   Cardiac Rehab from 09/04/2018 in [facility]  Date  09/04/18  Educator  [place]  Instruction Review Code  1- Verbalizes Understanding      Know Your Numbers and Risk Factors: -Group verbal and written instruction about important numbers in your health.  Discussion of what are risk factors and how they play a role in the disease process.  Review of Cholesterol, Blood Pressure, Diabetes, and BMI and the role they play in your overall health.   Sleep Hygiene: -Provides group verbal and written instruction about how sleep can affect your health.  Define sleep hygiene, discuss sleep cycles and impact of sleep habits. Review good sleep hygiene  tips.    Other: -Provides group and verbal instruction on various topics (see comments)   Knowledge Questionnaire Score: Knowledge Questionnaire Score - 08/30/18 0724      Knowledge Questionnaire Score   Pre Score  16/26  Missed Heart Attack, angina, PAD, Heart Failure, Nutrition and Exercise questions       Core Components/Risk Factors/Patient Goals at Admission: Personal Goals and Risk Factors at Admission - 08/29/18 1221      Core  Components/Risk Factors/Patient Goals on Admission    Weight Management  Yes;Weight Loss    Intervention  Weight Management: Develop a combined nutrition and exercise program designed to reach desired caloric intake, while maintaining appropriate intake of nutrient and fiber, sodium and fats, and appropriate energy expenditure required for the weight goal.;Weight Management: Provide education and appropriate resources to help participant work on and attain dietary goals.;Weight Management/Obesity: Establish reasonable short term and long term weight goals.    Expected Outcomes  Short Term: Continue to assess and modify interventions until short term weight is achieved;Long Term: Adherence to nutrition and physical activity/exercise program aimed toward attainment of established weight goal;Weight Loss: Understanding of general recommendations for a balanced deficit meal plan, which promotes 1-2 lb weight loss per week and includes a negative energy balance of [phone] kcal/d    Tobacco Cessation  Yes    Number of packs per day  uses dip   only 1 dip a day   Wants to Quit    Intervention  Assist the participant in steps to quit. Provide individualized education and counseling about committing to Tobacco Cessation, relapse prevention, and pharmacological support that can be provided by physician.;Advice worker, assist with locating and accessing local/national Quit Smoking programs, and support quit date choice.    Expected Outcomes  Short Term:  Will demonstrate readiness to quit, by selecting a quit date.;Short Term: Will quit all tobacco product use, adhering to prevention of relapse plan.;Long Term: Complete abstinence from all tobacco products for at least 12 months from quit date.    Diabetes  Yes    Intervention  Provide education about signs/symptoms and action to take for hypo/hyperglycemia.;Provide education about proper nutrition, including hydration, and aerobic/resistive exercise prescription along with prescribed medications to achieve blood glucose in normal ranges: Fasting glucose 65-99 mg/dL    Expected Outcomes  Short Term: Participant verbalizes understanding of the signs/symptoms and immediate care of hyper/hypoglycemia, proper foot care and importance of medication, aerobic/resistive exercise and nutrition plan for blood glucose control.;Long Term: Attainment of HbA1C < 7%.    Hypertension  Yes    Intervention  Provide education on lifestyle modifcations including regular physical activity/exercise, weight management, moderate sodium restriction and increased consumption of fresh fruit, vegetables, and low fat dairy, alcohol moderation, and smoking cessation.;Monitor prescription use compliance.    Expected Outcomes  Short Term: Continued assessment and intervention until BP is < 140/45m HG in hypertensive participants. < 130/834mHG in hypertensive participants with diabetes, heart failure or chronic kidney disease.;Long Term: Maintenance of blood pressure at goal levels.    Lipids  Yes    Intervention  Provide education and support for participant on nutrition & aerobic/resistive exercise along with prescribed medications to achieve LDL <7071mHDL >36m62m  Expected Outcomes  Short Term: Participant states understanding of desired cholesterol values and is compliant with medications prescribed. Participant is following exercise prescription and nutrition guidelines.;Long Term: Cholesterol controlled with medications as  prescribed, with individualized exercise RX and with personalized nutrition plan. Value goals: LDL < 70mg33mL > 40 mg.       Core Components/Risk Factors/Patient Goals Review:    Core Components/Risk Factors/Patient Goals at Discharge (Final Review):    ITP Comments: ITP Comments    Row Name 08/29/18 1230 09/06/18 1620 09/12/18 0604 09/20/18 0901     ITP Comments  Virtual Orientation completed today   Documentation of the Diagnosis [facility] found [facility].  First full day of exercise!  Patient was oriented to gym and equipment including functions, settings, policies, and procedures.  Patient's individual exercise prescription and treatment plan were reviewed.  All starting workloads were established based on the results of the 6 minute walk test done at initial orientation visit.  The plan for exercise progression was also introduced and progression will be customized based on patient's performance and goals.  30 Day Review Completed today. Continue with ITP unless changed by Medical Director review.  New to program  Corey [name] has not attended rehab since 09/06/18.  Called to check on him.  He said that he has been going to the doctor a lot and not feeling well.  Offered to discharge at this time until he feels better.  Pt was agreeable to discharge at this time.       Comments: Discharge ITP

## 2018-09-20 NOTE — Telephone Encounter (Signed)
Corey [name] has not attended rehab since 09/06/18.  Called to check on him.  He said that he has been going to the doctor a lot and not feeling well.  Offered to discharge at this time until he feels better.  Pt was agreeable to discharge at this time.

## 2018-09-21 LAB — HM DIABETES EYE EXAM

## 2018-09-22 ENCOUNTER — Other Ambulatory Visit: Payer: Self-pay | Admitting: Family Medicine

## 2018-09-22 NOTE — Telephone Encounter (Signed)
Requested Prescriptions  Pending Prescriptions Disp Refills  . metFORMIN (GLUCOPHAGE) 1000 MG tablet [Pharmacy Med Name: METFORMIN 1000MG TABLETS] 180 tablet 1    Sig: TAKE 1 TABLET(1000 MG) BY MOUTH TWICE DAILY     Endocrinology:  Diabetes - Biguanides Failed - 09/22/2018  7:17 AM      Failed - HBA1C is between 0 and 7.9 and within 180 days    HB A1C (BAYER DCA - WAIVED)  Date Value Ref Range Status  03/02/2018 9.7 (H) <7.0 % Final    Comment:                                          Diabetic Adult            <7.0                                       Healthy Adult        4.3 - 5.7                                                           (DCCT/NGSP) American Diabetes Association's Summary of Glycemic Recommendations for Adults with Diabetes: Hemoglobin A1c <7.0%. More stringent glycemic goals (A1c <6.0%) may further reduce complications at the cost of increased risk of hypoglycemia.          Passed - Cr in normal range and within 360 days    Creatinine, Ser  Date Value Ref Range Status  03/02/2018 1.19 0.76 - 1.27 mg/dL Final         Passed - eGFR in normal range and within 360 days    GFR calc Af Amer  Date Value Ref Range Status  03/02/2018 76 >59 mL/min/1.73 Final   GFR calc non Af Amer  Date Value Ref Range Status  03/02/2018 66 >59 mL/min/1.73 Final         Passed - Valid encounter within last 6 months    Recent Outpatient Visits          3 weeks ago Essential hypertension   [facility] Guadalupe Maple, MD   3 months ago Insulin dependent diabetes mellitus [facility])   [place], [place], [place]   5 months ago Insulin dependent diabetes mellitus [facility])   [place], [place], [place]   6 months ago Insulin dependent diabetes mellitus [facility])   [facility], [place]   7 months ago Essential hypertension   [place], Lilia Argue, [place]      Future  Appointments            In 1 month  [employer] [facility], [place]   In 3 months [place], Lilia Argue, [place], [place]

## 2018-09-25 ENCOUNTER — Other Ambulatory Visit: Payer: Self-pay | Admitting: Family Medicine

## 2018-09-25 NOTE — Telephone Encounter (Signed)
routing to provider

## 2018-09-25 NOTE — Telephone Encounter (Signed)
Requested medication (s) are due for refill today: yes  Requested medication (s) are on the active medication list: yes  Last refill:  5/20  Future visit scheduled: yes  Notes to clinic:  Requesting 1 year supply   Requested Prescriptions  Pending Prescriptions Disp Refills   GLYXAMBI 25-5 MG TABS [Pharmacy Med Name: GLYXAMBI  25MG  5MG   TAB] 90 tablet 3    Sig: TAKE 1 TABLET BY MOUTH IN  THE MORNING     There is no refill protocol information for this order     rosuvastatin (CRESTOR) 20 MG tablet [Pharmacy Med Name: ROSUVASTATIN  20MG   TAB] 90 tablet 3    Sig: TAKE 1 TABLET BY MOUTH  DAILY     Cardiovascular:  Antilipid - Statins Failed - 09/25/2018  7:15 AM      Failed - HDL in normal range and within 360 days    HDL  Date Value Ref Range Status  07/25/2017 38 (L) >39 mg/dL Final         Failed - Triglycerides in normal range and within 360 days    Triglycerides Piccolo,Waived  Date Value Ref Range Status  01/29/2018 292 (H) <150 mg/dL Final    Comment:                            Normal                   <150                         Borderline High     150 - 199                         High                200 - 499                         Very High                >499          Passed - Total Cholesterol in normal range and within 360 days    Cholesterol Piccolo, Waived  Date Value Ref Range Status  01/29/2018 157 <200 mg/dL Final    Comment:                            Desirable                <200                         Borderline High      200- 239                         High                     >239          Passed - LDL in normal range and within 360 days    LDL Calculated  Date Value Ref Range Status  07/25/2017 73 0 - 99 mg/dL Final         Passed - Patient is not pregnant      Passed - Valid  encounter within last 12 months    Recent Outpatient Visits          3 weeks ago Essential hypertension   [place], Jeannette How, [place]   3  months ago Insulin dependent diabetes mellitus [facility])   [place], [place], [place]   5 months ago Insulin dependent diabetes mellitus [facility])   [place], [place], [place]   6 months ago Insulin dependent diabetes mellitus [facility])   [facility], [place]   7 months ago Essential hypertension   [place], Lilia Argue, [place]      Future Appointments            In 1 month  [employer] [facility], [place]   In 3 months [place], Lilia Argue, PA-C [employer], [place]

## 2018-09-26 NOTE — Telephone Encounter (Signed)
Has a 6 month supply of crestor written 06/2018, I do not write for 1 year as there is a possibility of needing changes based on follow ups. Will need to request glyxambi from Endocrinologist he's following with

## 2018-10-12 DIAGNOSIS — J45909 Unspecified asthma, uncomplicated: Secondary | ICD-10-CM | POA: Diagnosis not present

## 2018-10-15 DIAGNOSIS — J45909 Unspecified asthma, uncomplicated: Secondary | ICD-10-CM | POA: Diagnosis not present

## 2018-10-17 DIAGNOSIS — J4520 Mild intermittent asthma, uncomplicated: Secondary | ICD-10-CM | POA: Diagnosis not present

## 2018-10-17 DIAGNOSIS — R0600 Dyspnea, unspecified: Secondary | ICD-10-CM | POA: Diagnosis not present

## 2018-10-20 ENCOUNTER — Other Ambulatory Visit: Payer: Self-pay | Admitting: Family Medicine

## 2018-10-23 ENCOUNTER — Other Ambulatory Visit: Payer: Self-pay | Admitting: Family Medicine

## 2018-11-02 ENCOUNTER — Other Ambulatory Visit: Payer: Self-pay

## 2018-11-05 ENCOUNTER — Other Ambulatory Visit: Payer: Self-pay | Admitting: Family Medicine

## 2018-11-05 MED ORDER — SUCRALFATE 1 G PO TABS: 1.0000 g | ORAL_TABLET | Freq: Three times a day (TID) | ORAL | 0 refills | Status: DC | PRN

## 2018-11-07 ENCOUNTER — Ambulatory Visit (INDEPENDENT_AMBULATORY_CARE_PROVIDER_SITE_OTHER): Payer: Medicare Other

## 2018-11-07 ENCOUNTER — Other Ambulatory Visit: Payer: Self-pay

## 2018-11-07 VITALS — BP 142/70 | HR 95 | Temp 97.6°F | Resp 16 | Ht 68.0 in | Wt 215.6 lb

## 2018-11-07 DIAGNOSIS — Z0000 Encounter for general adult medical examination without abnormal findings: Secondary | ICD-10-CM

## 2018-11-07 DIAGNOSIS — Z1211 Encounter for screening for malignant neoplasm of colon: Secondary | ICD-10-CM | POA: Diagnosis not present

## 2018-11-07 NOTE — Patient Instructions (Signed)
Corey [name] , Thank you for taking time to come for your Medicare Wellness Visit. I appreciate your ongoing commitment to your health goals. Please review the following plan we discussed and let me know if I can assist you in the future.   Screening recommendations/referrals: Colonoscopy: due now someone will contact you.  Recommended yearly ophthalmology/optometry visit for glaucoma screening and checkup Recommended yearly dental visit for hygiene and checkup  Vaccinations: Influenza vaccine: up to date Pneumococcal vaccine: up to date Tdap vaccine: up to date  Shingles vaccine: up to date     Advanced directives:  Advance directive discussed with you today. I have provided a copy for you to complete at home and have notarized. Once this is complete please bring a copy in to our office so we can scan it into your chart.  Conditions/risks identified: diabetic, discussed chronic care management team   Next appointment: follow up in one year for your annual wellness visit   Preventive Care 40-64 Years, Male Preventive care refers to lifestyle choices and visits with your health care provider that can promote health and wellness. What does preventive care include?  A yearly physical exam. This is also called an annual well check.  Dental exams once or twice a year.  Routine eye exams. Ask your health care provider how often you should have your eyes checked.  Personal lifestyle choices, including:  Daily care of your teeth and gums.  Regular physical activity.  Eating a healthy diet.  Avoiding tobacco and drug use.  Limiting alcohol use.  Practicing safe sex.  Taking low-dose aspirin every day starting at age 75. What happens during an annual well check? The services and screenings done by your health care provider during your annual well check will depend on your age, overall health, lifestyle risk factors, and family history of disease. Counseling  Your health care  provider may ask you questions about your:  Alcohol use.  Tobacco use.  Drug use.  Emotional well-being.  Home and relationship well-being.  Sexual activity.  Eating habits.  Work and work Statistician. Screening  You may have the following tests or measurements:  Height, weight, and BMI.  Blood pressure.  Lipid and cholesterol levels. These may be checked every 5 years, or more frequently if you are over 33 years old.  Skin check.  Lung cancer screening. You may have this screening every year starting at age 38 if you have a 30-pack-year history of smoking and currently smoke or have quit within the past 15 years.  Fecal occult blood test (FOBT) of the stool. You may have this test every year starting at age 41.  Flexible sigmoidoscopy or colonoscopy. You may have a sigmoidoscopy every 5 years or a colonoscopy every 10 years starting at age 27.  Prostate cancer screening. Recommendations will vary depending on your family history and other risks.  Hepatitis C blood test.  Hepatitis B blood test.  Sexually transmitted disease (STD) testing.  Diabetes screening. This is done by checking your blood sugar (glucose) after you have not eaten for a while (fasting). You may have this done every 1-3 years. Discuss your test results, treatment options, and if necessary, the need for more tests with your health care provider. Vaccines  Your health care provider may recommend certain vaccines, such as:  Influenza vaccine. This is recommended every year.  Tetanus, diphtheria, and acellular pertussis (Tdap, Td) vaccine. You may need a Td booster every 10 years.  Zoster vaccine. You may  need this after age 40.  Pneumococcal 13-valent conjugate (PCV13) vaccine. You may need this if you have certain conditions and have not been vaccinated.  Pneumococcal polysaccharide (PPSV23) vaccine. You may need one or two doses if you smoke cigarettes or if you have certain conditions. Talk  to your health care provider about which screenings and vaccines you need and how often you need them. This information is not intended to replace advice given to you by your health care provider. Make sure you discuss any questions you have with your health care provider. Document Released: 02/13/2015 Document Revised: 10/07/2015 Document Reviewed: 11/18/2014 Elsevier Interactive Patient Education  2017 [place] Prevention in the Home Falls can cause injuries. They can happen to people of all ages. There are many things you can do to make your home safe and to help prevent falls. What can I do on the outside of my home?  Regularly fix the edges of walkways and driveways and fix any cracks.  Remove anything that might make you trip as you walk through a door, such as a raised step or threshold.  Trim any bushes or trees on the path to your home.  Use bright outdoor lighting.  Clear any walking paths of anything that might make someone trip, such as rocks or tools.  Regularly check to see if handrails are loose or broken. Make sure that both sides of any steps have handrails.  Any raised decks and porches should have guardrails on the edges.  Have any leaves, snow, or ice cleared regularly.  Use sand or salt on walking paths during winter.  Clean up any spills in your garage right away. This includes oil or grease spills. What can I do in the bathroom?  Use night lights.  Install grab bars by the toilet and in the tub and shower. Do not use towel bars as grab bars.  Use non-skid mats or decals in the tub or shower.  If you need to sit down in the shower, use a plastic, non-slip stool.  Keep the floor dry. Clean up any water that spills on the floor as soon as it happens.  Remove soap buildup in the tub or shower regularly.  Attach bath mats securely with double-sided non-slip rug tape.  Do not have throw rugs and other things on the floor that can make you  trip. What can I do in the bedroom?  Use night lights.  Make sure that you have a light by your bed that is easy to reach.  Do not use any sheets or blankets that are too big for your bed. They should not hang down onto the floor.  Have a firm chair that has side arms. You can use this for support while you get dressed.  Do not have throw rugs and other things on the floor that can make you trip. What can I do in the kitchen?  Clean up any spills right away.  Avoid walking on wet floors.  Keep items that you use a lot in easy-to-reach places.  If you need to reach something above you, use a strong step stool that has a grab bar.  Keep electrical cords out of the way.  Do not use floor polish or wax that makes floors slippery. If you must use wax, use non-skid floor wax.  Do not have throw rugs and other things on the floor that can make you trip. What can I do with my stairs?  Do  not leave any items on the stairs.  Make sure that there are handrails on both sides of the stairs and use them. Fix handrails that are broken or loose. Make sure that handrails are as long as the stairways.  Check any carpeting to make sure that it is firmly attached to the stairs. Fix any carpet that is loose or worn.  Avoid having throw rugs at the top or bottom of the stairs. If you do have throw rugs, attach them to the floor with carpet tape.  Make sure that you have a light switch at the top of the stairs and the bottom of the stairs. If you do not have them, ask someone to add them for you. What else can I do to help prevent falls?  Wear shoes that:  Do not have high heels.  Have rubber bottoms.  Are comfortable and fit you well.  Are closed at the toe. Do not wear sandals.  If you use a stepladder:  Make sure that it is fully opened. Do not climb a closed stepladder.  Make sure that both sides of the stepladder are locked into place.  Ask someone to hold it for you, if  possible.  Clearly mark and make sure that you can see:  Any grab bars or handrails.  First and last steps.  Where the edge of each step is.  Use tools that help you move around (mobility aids) if they are needed. These include:  Canes.  Walkers.  Scooters.  Crutches.  Turn on the lights when you go into a dark area. Replace any light bulbs as soon as they burn out.  Set up your furniture so you have a clear path. Avoid moving your furniture around.  If any of your floors are uneven, fix them.  If there are any pets around you, be aware of where they are.  Review your medicines with your doctor. Some medicines can make you feel dizzy. This can increase your chance of falling. Ask your doctor what other things that you can do to help prevent falls. This information is not intended to replace advice given to you by your health care provider. Make sure you discuss any questions you have with your health care provider. Document Released: 11/13/2008 Document Revised: 06/25/2015 Document Reviewed: 02/21/2014 Elsevier Interactive Patient Education  2017 [employer].

## 2018-11-07 NOTE — Progress Notes (Signed)
Subjective:   Corey [name] is a 63 y.o. male who presents for Medicare Annual/Subsequent preventive examination.  Review of Systems:         Objective:    Vitals: BP (!) 142/70 (BP Location: Right Arm, Patient Position: Sitting, Cuff Size: Normal)   Pulse 95   Temp 97.6 F (36.4 C) (Temporal)   Resp 16   Ht 5' 8"  (1.727 m)   Wt 215 lb 9.6 oz (97.8 kg)   BMI 32.78 kg/m   Body mass index is 32.78 kg/m.  Advanced Directives 08/29/2018 07/25/2018 10/26/2017  Does Patient Have a Medical Advance Directive? No No No  Would patient like information on creating a medical advance directive? No - Patient declined Yes (MAU/Ambulatory/Procedural Areas - Information given) No - Patient declined    Tobacco Social History   Tobacco Use  Smoking Status Former Smoker  . Packs/day: 0.50  . Types: Cigarettes  Smokeless Tobacco Current User  . Types: Chew  Tobacco Comment   quit 40 years ago      Ready to quit: Not Answered Counseling given: Not Answered Comment: quit 40 years ago    Clinical Intake:  Pre-visit preparation completed: Yes  Pain : No/denies pain     Nutritional Status: BMI > 30  Obese Nutritional Risks: None Diabetes: Yes CBG done?: No Did pt. bring in CBG monitor from home?: No     Nutrition Risk Assessment:  Has the patient had any N/V/D within the last 2 months?  No  Does the patient have any non-healing wounds?  No  Has the patient had any unintentional weight loss or weight gain?  No   Diabetes:  Is the patient diabetic?  Yes  If diabetic, was a CBG obtained today?  No  Did the patient bring in their glucometer from home?  No  How often do you monitor your CBG's? Twice a day .   Financial Strains and Diabetes Management:  Are you having any financial strains with the device, your supplies or your medication? No .  Does the patient want to be seen by Chronic Care Management for management of their diabetes?  Yes  Would the patient like to  be referred to a Nutritionist or for Diabetic Management?  Yes   Diabetic Exams:  Diabetic Eye Exam: Completed 09/21/2017. Overdue for diabetic eye exam. Pt has been advised about the importance in completing this exam. A referral has been placed today. Message sent to referral coordinator for scheduling purposes. Advised pt to expect a call from our office re: appt.  Diabetic Foot Exam: Completed 07/25/2017. Pt has been advised about the importance in completing this exam.  Interpreter Needed?: No  Information entered by :: Johany Hansman,LPN  Past Medical History:  Diagnosis Date  . Asthma   . Diabetes ([place])   . Hyperlipidemia   . Hypertension   . Legg-Perthes disease    Past Surgical History:  Procedure Laterality Date  . Foot Surgery    . HIP SURGERY    . RIGHT/LEFT HEART CATH AND CORONARY ANGIOGRAPHY N/A 07/25/2018   Procedure: RIGHT/LEFT HEART CATH AND CORONARY ANGIOGRAPHY;  Surgeon: Yolonda Kida, MD;  Location: [facility];  Service: Cardiovascular;  Laterality: N/A;   Family History  Problem Relation Age of Onset  . Cancer Mother   . Heart disease Mother   . Diabetes Father    Social History   Socioeconomic History  . Marital status: Divorced    Spouse name: Not  on file  . Number of children: Not on file  . Years of education: Not on file  . Highest education level: 8th grade  Occupational History  . Not on file  Social Needs  . Financial resource strain: Somewhat hard  . Food insecurity    Worry: Never true    Inability: Never true  . Transportation needs    Medical: No    Non-medical: No  Tobacco Use  . Smoking status: Former Smoker    Packs/day: 0.50    Types: Cigarettes  . Smokeless tobacco: Current User    Types: Chew  . Tobacco comment: quit 40 years ago   Substance and Sexual Activity  . Alcohol use: Not Currently  . Drug use: Never  . Sexual activity: Not on file  Lifestyle  . Physical activity    Days per week: 0 days     Minutes per session: 0 min  . Stress: Not at all  Relationships  . Social Herbalist on phone: Once a week    Gets together: More than three times a week    Attends religious service: Never    Active member of club or organization: No    Attends meetings of clubs or organizations: Never    Relationship status: Divorced  Other Topics Concern  . Not on file  Social History Narrative  . Not on file    Outpatient Encounter Medications as of 11/07/2018  Medication Sig  . ACCU-CHEK FASTCLIX LANCETS MISC USE TO CHECK BLOOD SUGAR  . ACCU-CHEK GUIDE test strip USE TO CHECK BLOOD SUGAR TID  . acetaminophen (TYLENOL) 500 MG tablet Take 1,000 mg by mouth every 6 (six) hours as needed for moderate pain or headache.  . albuterol (PROVENTIL) (2.5 MG/3ML) 0.083% nebulizer solution Take 3 mLs (2.5 mg total) by nebulization every 6 (six) hours as needed for wheezing or shortness of breath.  Marland Kitchen albuterol (VENTOLIN HFA) 108 (90 Base) MCG/ACT inhaler INHALE 2 PUFFS INTO THE LUNGS EVERY 6 HOURS AS NEEDED FOR WHEEZING OR SHORTNESS OF BREATH  . amitriptyline (ELAVIL) 50 MG tablet Take 1-2 tablets (50-100 mg total) by mouth at bedtime.  Marland Kitchen ammonium lactate (AMLACTIN) 12 % lotion Apply to affected area twice daily as needed (Patient taking differently: Apply 1 application topically daily. )  . aspirin EC 81 MG tablet Take 81 mg by mouth daily.  Marland Kitchen azelastine (ASTELIN) 0.1 % nasal spray Place 2 sprays into both nostrils 2 (two) times daily. Use in each nostril as directed  . B-D UF III MINI PEN NEEDLES 31G X 5 MM MISC USE TWICE DAILY  . Blood Glucose Monitoring Suppl (ACCU-CHEK GUIDE) w/Device KIT U UTD  . cetirizine (ZYRTEC) 10 MG tablet Take 1 tablet (10 mg total) by mouth daily.  . Cholecalciferol (VITAMIN D3) 5000 units TABS Take 5,000 Units by mouth daily.   . citalopram (CELEXA) 10 MG tablet TAKE 1 TABLET(10 MG) BY MOUTH DAILY (Patient taking differently: Take 10 mg by mouth daily. )  . clopidogrel  (PLAVIX) 75 MG tablet Take by mouth.  . diclofenac sodium (VOLTAREN) 1 % GEL Apply 1 application topically 4 (four) times daily as needed (pain).   . Fluticasone-Salmeterol (ADVAIR) 500-50 MCG/DOSE AEPB Inhale 1 puff into the lungs 2 (two) times daily.  . furosemide (LASIX) 20 MG tablet TAKE 1 TABLET(20 MG) BY MOUTH TWICE DAILY  . GLYXAMBI 25-5 MG TABS TAKE 1 TABLET BY MOUTH IN  THE MORNING  . Insulin Syringe-Needle U-100  30G X 1/2" 1 ML MISC 1 Units by Does not apply route daily.  . Lancets Misc. (ACCU-CHEK FASTCLIX LANCET) KIT 1 Units by Does not apply route 2 (two) times daily.  Marland Kitchen LANTUS SOLOSTAR 100 UNIT/ML Solostar Pen INJECT 75 UNITS [place] D. (Patient taking differently: 80 Units. INJECT 75 UNITS [place] D.)  . lisinopril (ZESTRIL) 10 MG tablet TAKE 1 TABLET(10 MG) BY MOUTH DAILY (Patient taking differently: Take 10 mg by mouth daily. )  . magnesium gluconate (MAGONATE) 500 MG tablet Take 500 mg by mouth daily.  . metFORMIN (GLUCOPHAGE) 1000 MG tablet TAKE 1 TABLET(1000 MG) BY MOUTH TWICE DAILY  . montelukast (SINGULAIR) 10 MG tablet TAKE 1 TABLET(10 MG) BY MOUTH DAILY  . OZEMPIC, 0.25 OR 0.5 MG/DOSE, 2 MG/1.5ML SOPN INJECT 0.25 MG UNDER THE SKIN ONCE PER WEEK  . pantoprazole (PROTONIX) 40 MG tablet Take 1 tablet (40 mg total) by mouth daily.  . pregabalin (LYRICA) 100 MG capsule TAKE 1 CAPSULE(100 MG) BY MOUTH TWICE DAILY  . primidone (MYSOLINE) 50 MG tablet TAKE 2 TABLETS(100 MG) BY MOUTH DAILY  . rosuvastatin (CRESTOR) 20 MG tablet TAKE 1 TABLET BY MOUTH  DAILY  . sucralfate (CARAFATE) 1 g tablet Take 1 tablet (1 g total) by mouth 3 (three) times daily as needed.  . [DISCONTINUED] meloxicam (MOBIC) 15 MG tablet Take 1 tablet (15 mg total) by mouth daily as needed for pain. TAKE 1 TABLET(15 MG) BY MOUTH DAILY  . meloxicam (MOBIC) 7.5 MG tablet   . [DISCONTINUED] OZEMPIC, 1 MG/DOSE, 2 MG/1.5ML SOPN Inject 1 mg as directed every Friday.   No facility-administered encounter medications on file as  of 11/07/2018.     Activities of Daily Living In your present state of health, do you have any difficulty performing the following activities: 07/25/2018  Hearing? N  Vision? N  Difficulty concentrating or making decisions? N  Walking or climbing stairs? N  Dressing or bathing? N  Some recent data might be hidden    Patient Care Team: Volney American, PA-C as PCP - General (Family Medicine)   Assessment:   This is a routine wellness examination for Corey [name].  Exercise Activities and Dietary recommendations    Goals    . DIET - INCREASE WATER INTAKE     Recommend drinking at least 6-8 glasses of water a day        Fall Risk Fall Risk  08/29/2018 10/26/2017 09/21/2017 07/25/2017  Falls in the past year? 0 No No No   FALL RISK PREVENTION PERTAINING TO THE HOME:  Any stairs in or around the home? No  If so, are there any without handrails? n/a  Home free of loose throw rugs in walkways, pet beds, electrical cords, etc? Yes  Adequate lighting in your home to reduce risk of falls? Yes   ASSISTIVE DEVICES UTILIZED TO PREVENT FALLS:  Life alert? No  Use of a cane, walker or w/c? No  Grab bars in the bathroom? No  Shower chair or bench in shower? No  Elevated toilet seat or a handicapped toilet? No    TIMED UP AND GO:  Was the test performed? Yes .  Length of time to ambulate 10 feet: 10 sec.   GAIT:  Appearance of gait: Gait steady and fast without the use of an assistive device.  Education: Fall risk prevention has been discussed.  Intervention(s) required? No   DME/home health order needed?  No    Depression Screen PHQ 2/9 Scores 06/21/2018 10/26/2017  07/25/2017  PHQ - 2 Score 0 0 0  PHQ- 9 Score 0 - 2    Cognitive Function     6CIT Screen 10/26/2017  What Year? 0 points  What month? 0 points  What time? 0 points  Count back from 20 0 points  Months in reverse 0 points  Repeat phrase 2 points  Total Score 2    Immunization History  Administered  Date(s) Administered  . Influenza,inj,Quad PF,6+ Mos 09/21/2017, 10/13/2018  . Influenza-Unspecified 10/13/2016  . Pneumococcal Polysaccharide-23 02/26/2016  . Tdap 07/25/2017  . Zoster Recombinat (Shingrix) 07/14/2016    Qualifies for Shingles Vaccine? Yes  first dose given   Tdap: up to date   Flu Vaccine: up to date   Pneumococcal Vaccine: up to date   Screening Tests Health Maintenance  Topic Date Due  . COLONOSCOPY  05/26/2007  . FOOT EXAM  07/26/2018  . HEMOGLOBIN A1C  08/31/2018  . INFLUENZA VACCINE  09/01/2018  . OPHTHALMOLOGY EXAM  09/22/2018  . TETANUS/TDAP  07/26/2027  . PNEUMOCOCCAL POLYSACCHARIDE VACCINE AGE 22-64 HIGH RISK  Completed  . Hepatitis C Screening  Completed  . HIV Screening  Completed   Cancer Screenings:  Colorectal Screening: referral to GI placed  Lung Cancer Screening: (Low Dose CT Chest recommended if Age 9-80 years, 30 pack-year currently smoking OR have quit w/in 15years.) does not qualify.    Additional Screening:  Hepatitis C Screening: does qualify; Completed 07/25/2017  Dental Screening: Recommended annual dental exams for proper oral hygiene  Community Resource Referral:  CRR required this visit?  No        Plan:  I have personally reviewed and addressed the Medicare Annual Wellness questionnaire and have noted the following in the patient's chart:  A. Medical and social history B. Use of alcohol, tobacco or illicit drugs  C. Current medications and supplements D. Functional ability and status E.  Nutritional status F.  Physical activity G. Advance directives H. List of other physicians I.  Hospitalizations, surgeries, and ER visits in previous 12 months J.  [place] such as hearing and vision if needed, cognitive and depression L. Referrals and appointments   In addition, I have reviewed and discussed with patient certain preventive protocols, quality metrics, and best practice recommendations. A written  personalized care plan for preventive services as well as general preventive health recommendations were provided to patient.   Signed,   Bevelyn Ngo, LPN  37/07/2829 Nurse Health Advisor  Nurse Notes: none

## 2018-11-08 ENCOUNTER — Telehealth: Payer: Self-pay

## 2018-11-08 NOTE — Telephone Encounter (Signed)
Needs appt, can be virtual 

## 2018-11-08 NOTE — Telephone Encounter (Signed)
Copied from [place] [id number]. Topic: Referral - Request for Referral >> Nov 08, 2018  3:50 PM Richardo Priest, [place] wrote: Has patient seen PCP for this complaint? no *If NO, is insurance requiring patient see PCP for this issue before PCP can refer them? Referral for which specialty: Dental Preferred provider/office: NA Reason for referral: Teeth needing to have pulled.

## 2018-11-09 DIAGNOSIS — J45909 Unspecified asthma, uncomplicated: Secondary | ICD-10-CM | POA: Diagnosis not present

## 2018-11-09 NOTE — Telephone Encounter (Signed)
Appt scheduled for 11/12/2018.

## 2018-11-11 DIAGNOSIS — J45909 Unspecified asthma, uncomplicated: Secondary | ICD-10-CM | POA: Diagnosis not present

## 2018-11-12 ENCOUNTER — Other Ambulatory Visit: Payer: Self-pay

## 2018-11-12 ENCOUNTER — Encounter: Payer: Self-pay | Admitting: Family Medicine

## 2018-11-12 ENCOUNTER — Ambulatory Visit (INDEPENDENT_AMBULATORY_CARE_PROVIDER_SITE_OTHER): Payer: Medicare Other | Admitting: Family Medicine

## 2018-11-12 VITALS — BP 144/83 | HR 73 | Temp 98.5°F

## 2018-11-12 DIAGNOSIS — L918 Other hypertrophic disorders of the skin: Secondary | ICD-10-CM | POA: Diagnosis not present

## 2018-11-12 DIAGNOSIS — M545 Low back pain, unspecified: Secondary | ICD-10-CM

## 2018-11-12 DIAGNOSIS — K219 Gastro-esophageal reflux disease without esophagitis: Secondary | ICD-10-CM

## 2018-11-12 DIAGNOSIS — K0889 Other specified disorders of teeth and supporting structures: Secondary | ICD-10-CM | POA: Diagnosis not present

## 2018-11-12 DIAGNOSIS — G8929 Other chronic pain: Secondary | ICD-10-CM | POA: Diagnosis not present

## 2018-11-12 MED ORDER — PANTOPRAZOLE SODIUM 40 MG PO TBEC: 40.0000 mg | DELAYED_RELEASE_TABLET | Freq: Every day | ORAL | 1 refills | Status: DC

## 2018-11-12 MED ORDER — BUPROPION HCL ER (XL) 150 MG PO TB24: 150.0000 mg | ORAL_TABLET | Freq: Every day | ORAL | 0 refills | Status: DC

## 2018-11-12 NOTE — Progress Notes (Signed)
BP (!) 144/83   Pulse 73   Temp 98.5 F (36.9 C)   SpO2 96%    Subjective:    Patient ID: Corey [name], male    DOB: 13-Sep-1955, 63 y.o.   MRN: [id number]  HPI: ANTHON [name] is a 63 y.o. male  Chief Complaint  Patient presents with  . Referral    Dentist   Having dental pain top and bottom jaw the past few months. Does not have a dentist in the area. Needs a referral to one. Heat and cold sensitivity, dull aching pain. Trying oragel with minimal relief. Has not seen a dentist in about 15 years.   Still having issues with his reflux. Got approval from Cardiologist to get back on PPI therapy for it. On low dose nexium and states it's not helping.   Has a growth on right corner of mouth that he states he cuts when shaving frequently and it will bleed. Unsure how long it's been there.   Requesting some sort of mobility scooter for when he's out running errands due to his chronic back pain. Is awaiting establishing with pain management for this issue but has hard time navigating lots of walking due to his pain, which he states causes some instability.   Relevant past medical, surgical, family and social history reviewed and updated as indicated. Interim medical history since our last visit reviewed. Allergies and medications reviewed and updated.  Review of Systems  Per HPI unless specifically indicated above     Objective:    BP (!) 144/83   Pulse 73   Temp 98.5 F (36.9 C)   SpO2 96%   Wt Readings from Last 3 Encounters:  11/07/18 215 lb 9.6 oz (97.8 kg)  09/04/18 217 lb 8 oz (98.7 kg)  07/25/18 219 lb (99.3 kg)    Physical Exam Vitals signs and nursing note reviewed.  Constitutional:      Appearance: Normal appearance.  HENT:     Head: Atraumatic.     Nose: Nose normal.     Mouth/Throat:     Mouth: Mucous membranes are moist.  Eyes:     Extraocular Movements: Extraocular movements intact.     Conjunctiva/sclera: Conjunctivae normal.  Neck:   Musculoskeletal: Normal range of motion and neck supple.  Cardiovascular:     Rate and Rhythm: Normal rate and regular rhythm.  Pulmonary:     Effort: Pulmonary effort is normal.     Breath sounds: Normal breath sounds.  Musculoskeletal: Normal range of motion.  Skin:    General: Skin is warm and dry.     Comments: Skin tag corner of mouth, right   Neurological:     General: No focal deficit present.     Mental Status: He is oriented to person, place, and time.  Psychiatric:        Mood and Affect: Mood normal.        Thought Content: Thought content normal.        Judgment: Judgment normal.     Results for orders placed or performed in visit on 09/06/18  Glucose, capillary  Result Value Ref Range   Glucose-Capillary 214 (H) 70 - 99 mg/dL  Glucose, capillary  Result Value Ref Range   Glucose-Capillary 169 (H) 70 - 99 mg/dL      Assessment & Plan:   Problem List Items Addressed This Visit      Digestive   GERD (gastroesophageal reflux disease)    Will increase PPI  therapy back to protonix 40 mg, which he was previously well controlled with. Diet modifications reviewed at length      Relevant Medications   pantoprazole (PROTONIX) 40 MG tablet    Other Visit Diagnoses    Pain, dental    -  Primary   Referral placed to dentistry for further evaluation and treatment. Salt water gargles and good brushing hygiene in meantime   Relevant Orders   Ambulatory referral to Dentistry   Skin tag       Referral to Dermatology placed for eval and tx. Avoid the area with razor in meantime   Relevant Orders   Ambulatory referral to Dermatology   Chronic midline low back pain without sciatica       CCM referral placed to evaluate his needs related to mobility   Relevant Orders   Referral to Chronic Care Management Services       Follow up plan: Return for next month as scheduled.

## 2018-11-12 NOTE — Assessment & Plan Note (Signed)
Will increase PPI therapy back to protonix 40 mg, which he was previously well controlled with. Diet modifications reviewed at length

## 2018-11-23 ENCOUNTER — Other Ambulatory Visit: Payer: Self-pay

## 2018-11-23 MED ORDER — ACCU-CHEK FASTCLIX LANCETS MISC: 12 refills | Status: DC

## 2018-11-29 ENCOUNTER — Other Ambulatory Visit: Payer: Self-pay | Admitting: Family Medicine

## 2018-11-29 NOTE — Telephone Encounter (Signed)
Forwarding medication refill request to PCP for review. 

## 2018-12-05 ENCOUNTER — Other Ambulatory Visit: Payer: Self-pay | Admitting: Family Medicine

## 2018-12-06 ENCOUNTER — Other Ambulatory Visit: Payer: Self-pay

## 2018-12-06 ENCOUNTER — Ambulatory Visit
Payer: Medicare Other | Attending: Student in an Organized Health Care Education/Training Program | Admitting: Student in an Organized Health Care Education/Training Program

## 2018-12-06 ENCOUNTER — Encounter: Payer: Self-pay | Admitting: Student in an Organized Health Care Education/Training Program

## 2018-12-06 VITALS — BP 115/64 | HR 94 | Temp 97.0°F | Resp 18 | Ht 68.0 in | Wt 212.0 lb

## 2018-12-06 DIAGNOSIS — M51369 Other intervertebral disc degeneration, lumbar region without mention of lumbar back pain or lower extremity pain: Secondary | ICD-10-CM

## 2018-12-06 DIAGNOSIS — M5442 Lumbago with sciatica, left side: Secondary | ICD-10-CM | POA: Insufficient documentation

## 2018-12-06 DIAGNOSIS — Z872 Personal history of diseases of the skin and subcutaneous tissue: Secondary | ICD-10-CM | POA: Diagnosis not present

## 2018-12-06 DIAGNOSIS — M170 Bilateral primary osteoarthritis of knee: Secondary | ICD-10-CM

## 2018-12-06 DIAGNOSIS — M533 Sacrococcygeal disorders, not elsewhere classified: Secondary | ICD-10-CM | POA: Diagnosis not present

## 2018-12-06 DIAGNOSIS — Z85828 Personal history of other malignant neoplasm of skin: Secondary | ICD-10-CM | POA: Diagnosis not present

## 2018-12-06 DIAGNOSIS — L814 Other melanin hyperpigmentation: Secondary | ICD-10-CM | POA: Diagnosis not present

## 2018-12-06 DIAGNOSIS — M5441 Lumbago with sciatica, right side: Secondary | ICD-10-CM | POA: Insufficient documentation

## 2018-12-06 DIAGNOSIS — G894 Chronic pain syndrome: Secondary | ICD-10-CM | POA: Diagnosis not present

## 2018-12-06 DIAGNOSIS — M5136 Other intervertebral disc degeneration, lumbar region: Secondary | ICD-10-CM | POA: Diagnosis not present

## 2018-12-06 DIAGNOSIS — G8929 Other chronic pain: Secondary | ICD-10-CM | POA: Diagnosis not present

## 2018-12-06 DIAGNOSIS — E1141 Type 2 diabetes mellitus with diabetic mononeuropathy: Secondary | ICD-10-CM | POA: Diagnosis not present

## 2018-12-06 DIAGNOSIS — M47816 Spondylosis without myelopathy or radiculopathy, lumbar region: Secondary | ICD-10-CM | POA: Diagnosis not present

## 2018-12-06 MED ORDER — TIZANIDINE HCL 4 MG PO TABS: 4.0000 mg | ORAL_TABLET | Freq: Two times a day (BID) | ORAL | 2 refills | Status: AC | PRN

## 2018-12-06 MED ORDER — PREGABALIN 100 MG PO CAPS: 100.0000 mg | ORAL_CAPSULE | Freq: Two times a day (BID) | ORAL | 2 refills | Status: DC

## 2018-12-06 NOTE — Progress Notes (Signed)
Patient's Name: Corey [name]  MRN: [id number]  Referring Provider: Warnell Forester, NP  DOB: [birth year]  PCP: Volney American, PA-C  DOS: 12/06/2018  Note by: Gillis Santa, MD  Service setting: Ambulatory outpatient  Specialty: Interventional Pain Management  Location: [facility] (AMB) Pain Management Facility  Visit type: Initial Patient Evaluation  Patient type: New Patient   Primary Reason(s) for Visit: Encounter for initial evaluation of one or more chronic problems (new to examiner) potentially causing chronic pain, and posing a threat to normal musculoskeletal function. (Level of risk: High) CC: Back Pain (lower) and Foot Pain (bilateral)  HPI  Mr. Kundinger is a 63 y.o. year old, male patient, who comes today to see [place] for the first time for an initial evaluation of his chronic pain. He has Insulin dependent diabetes mellitus; Asthma; RLS (restless legs syndrome); Allergic rhinitis; Essential hypertension; Major depression; Tremor; Hypogonadism male; Headache; Hyperlipidemia; GERD (gastroesophageal reflux disease); Chronic bilateral low back pain with bilateral sciatica; Lumbar degenerative disc disease; Lumbar facet arthropathy; Sacroiliac joint pain; Chronic pain syndrome; and Bilateral primary osteoarthritis of knee on their problem list. Today he comes in for evaluation of his Back Pain (lower) and Foot Pain (bilateral)  Pain Assessment: Location: Lower Back Radiating: both legs to the feet Onset: More than a month ago Duration: Chronic pain Quality: Throbbing Severity: 5 /10 (subjective, self-reported pain score)  Effect on ADL: difficulty performing daily activities Timing: Intermittent Modifying factors: rest BP: 115/64  HR: 94  Onset and Duration: Present longer than 3 months Cause of pain: Unknown Severity: NAS-11 at its worse: 10/10, NAS-11 at its best: 5/10 and NAS-11 now: 4/10 Timing: Morning, Afternoon, Night and During activity or exercise Aggravating Factors:  Bending, Lifiting, Prolonged sitting and Prolonged standing Alleviating Factors: Medications Associated Problems: Tingling Quality of Pain: Aching, Sharp, Stabbing and Throbbing Previous Examinations or Tests: The patient denies any previous exams or tests Previous Treatments: The patient denies any previous treatments  The patient comes into the clinics today for the first time for a chronic pain management evaluation.   Chief complaint of low back pain with radiation into bilateral legs.  Also has bilateral feet pain.  Type II diabetic on insulin.  Elevated A1c, most recent was 9.7.  No vision problems.  Diabetic neuropathy.  Has been told that he has cervical degenerative disc disease when he had x-rays performed in [place] after he saw an orthopedist there.  He is on Lyrica 100 mg daily which she states is helpful.  He takes this medication at night.  He also takes amitriptyline 50-100 mg nightly.  He states that he was referred for physical therapy in [place] and did tried on a couple of occasions for his neck.  He does not work.  We will focus on nonopioid-based pain management and nonsteroid-based interventional pain therapy.    Meds   Current Outpatient Medications:  .  Accu-Chek FastClix Lancets MISC, USE TO CHECK BLOOD SUGAR, Disp: 100 each, Rfl: 12 .  ACCU-CHEK GUIDE test strip, USE TO CHECK BLOOD SUGAR TID, Disp: 100 each, Rfl: 12 .  acetaminophen (TYLENOL) 500 MG tablet, Take 1,000 mg by mouth every 6 (six) hours as needed for moderate pain or headache., Disp: , Rfl:  .  albuterol (PROVENTIL) (2.5 MG/3ML) 0.083% nebulizer solution, Take 3 mLs (2.5 mg total) by nebulization every 6 (six) hours as needed for wheezing or shortness of breath., Disp: 150 mL, Rfl: 1 .  albuterol (VENTOLIN HFA) 108 (90 Base) MCG/ACT inhaler, INHALE  2 PUFFS INTO THE LUNGS EVERY 6 HOURS AS NEEDED FOR WHEEZING OR SHORTNESS OF BREATH, Disp: 25.5 g, Rfl: 0 .  amitriptyline (ELAVIL) 50 MG tablet, Take  1-2 tablets (50-100 mg total) by mouth at bedtime., Disp: 180 tablet, Rfl: 1 .  ammonium lactate (AMLACTIN) 12 % lotion, Apply to affected area twice daily as needed (Patient taking differently: Apply 1 application topically daily. ), Disp: 396 g, Rfl: 1 .  aspirin EC 81 MG tablet, Take 81 mg by mouth daily., Disp: , Rfl:  .  azelastine (ASTELIN) 0.1 % nasal spray, Place 2 sprays into both nostrils 2 (two) times daily. Use in each nostril as directed, Disp: 30 mL, Rfl: 6 .  B-D UF III MINI PEN NEEDLES 31G X 5 MM MISC, USE TWICE DAILY, Disp: 100 each, Rfl: 11 .  Blood Glucose Monitoring Suppl (ACCU-CHEK GUIDE) w/Device KIT, U UTD, Disp: , Rfl: 0 .  cetirizine (ZYRTEC) 10 MG tablet, Take 1 tablet (10 mg total) by mouth daily., Disp: 90 tablet, Rfl: 1 .  citalopram (CELEXA) 10 MG tablet, TAKE 1 TABLET(10 MG) BY MOUTH DAILY (Patient taking differently: Take 10 mg by mouth daily. ), Disp: 90 tablet, Rfl: 1 .  clopidogrel (PLAVIX) 75 MG tablet, Take by mouth., Disp: , Rfl:  .  diclofenac sodium (VOLTAREN) 1 % GEL, Apply 1 application topically 4 (four) times daily as needed (pain). , Disp: , Rfl:  .  Fluticasone-Salmeterol (ADVAIR) 500-50 MCG/DOSE AEPB, Inhale 1 puff into the lungs 2 (two) times daily., Disp: 1 each, Rfl: 6 .  furosemide (LASIX) 20 MG tablet, TAKE 1 TABLET(20 MG) BY MOUTH TWICE DAILY, Disp: 180 tablet, Rfl: 1 .  GLYXAMBI 25-5 MG TABS, TAKE 1 TABLET BY MOUTH IN  THE MORNING, Disp: 90 tablet, Rfl: 3 .  Insulin Syringe-Needle U-100 30G X 1/2" 1 ML MISC, 1 Units by Does not apply route daily., Disp: 90 each, Rfl: 3 .  Lancets Misc. (ACCU-CHEK FASTCLIX LANCET) KIT, 1 Units by Does not apply route 2 (two) times daily., Disp: 3 kit, Rfl: 3 .  LANTUS SOLOSTAR 100 UNIT/ML Solostar Pen, INJECT 75 UNITS [place] D. (Patient taking differently: 80 Units. INJECT 75 UNITS [place] D.), Disp: 45 mL, Rfl: 11 .  losartan (COZAAR) 50 MG tablet, Take 50 mg by mouth daily., Disp: , Rfl:  .  magnesium gluconate  (MAGONATE) 500 MG tablet, Take 500 mg by mouth daily., Disp: , Rfl:  .  meloxicam (MOBIC) 7.5 MG tablet, , Disp: , Rfl:  .  metFORMIN (GLUCOPHAGE) 1000 MG tablet, TAKE 1 TABLET(1000 MG) BY MOUTH TWICE DAILY, Disp: 180 tablet, Rfl: 1 .  metoprolol succinate (TOPROL-XL) 25 MG 24 hr tablet, Take 25 mg by mouth daily., Disp: , Rfl:  .  montelukast (SINGULAIR) 10 MG tablet, TAKE 1 TABLET(10 MG) BY MOUTH DAILY, Disp: 90 tablet, Rfl: 1 .  OZEMPIC, 0.25 OR 0.5 MG/DOSE, 2 MG/1.5ML SOPN, INJECT 0.25 MG UNDER THE SKIN ONCE PER WEEK, Disp: 1.5 mL, Rfl: 3 .  pregabalin (LYRICA) 100 MG capsule, Take 1 capsule (100 mg total) by mouth 2 (two) times daily., Disp: 60 capsule, Rfl: 2 .  primidone (MYSOLINE) 50 MG tablet, TAKE 2 TABLETS(100 MG) BY MOUTH DAILY, Disp: 180 tablet, Rfl: 1 .  sucralfate (CARAFATE) 1 g tablet, TAKE 1 TABLET(1 GRAM) BY MOUTH THREE TIMES DAILY AS NEEDED, Disp: 90 tablet, Rfl: 0 .  Cholecalciferol (VITAMIN D3) 5000 units TABS, Take 5,000 Units by mouth daily. , Disp: , Rfl:  .  tiZANidine (ZANAFLEX) 4 MG tablet, Take 1 tablet (4 mg total) by mouth 3 times/day as needed-between meals & bedtime for muscle spasms., Disp: 30 tablet, Rfl: 2    ROS  Cardiovascular: High blood pressure and Blood thinners:  Anticoagulant Pulmonary or Respiratory: Wheezing and difficulty taking a deep full breath (Asthma) Neurological: No reported neurological signs or symptoms such as seizures, abnormal skin sensations, urinary and/or fecal incontinence, being born with an abnormal open spine and/or a tethered spinal cord Review of Past Neurological Studies: No results found for this or any previous visit. Psychological-Psychiatric: No reported psychological or psychiatric signs or symptoms such as difficulty sleeping, anxiety, depression, delusions or hallucinations (schizophrenial), mood swings (bipolar disorders) or suicidal ideations or attempts Gastrointestinal: No reported gastrointestinal signs or symptoms  such as vomiting or evacuating blood, reflux, heartburn, alternating episodes of diarrhea and constipation, inflamed or scarred liver, or pancreas or irrregular and/or infrequent bowel movements Genitourinary: No reported renal or genitourinary signs or symptoms such as difficulty voiding or producing urine, peeing blood, non-functioning kidney, kidney stones, difficulty emptying the bladder, difficulty controlling the flow of urine, or chronic kidney disease Hematological: No reported hematological signs or symptoms such as prolonged bleeding, low or poor functioning platelets, bruising or bleeding easily, hereditary bleeding problems, low energy levels due to low hemoglobin or being anemic Endocrine: High blood sugar controlled without the use of insulin (NIDDM) Rheumatologic: No reported rheumatological signs and symptoms such as fatigue, joint pain, tenderness, swelling, redness, heat, stiffness, decreased range of motion, with or without associated rash Musculoskeletal: Negative for myasthenia gravis, muscular dystrophy, multiple sclerosis or malignant hyperthermia Work History: Out of work due to pain  Allergies  Mr. Gaymon has No Known Allergies.  Laboratory Chemistry Profile   Screening Lab Results  Component Value Date   SARSCOV2NAA NOT DETECTED 07/20/2018   COVIDSOURCE NASOPHARYNGEAL 07/20/2018   HIV Non Reactive 07/25/2017    Renal Lab Results  Component Value Date   BUN 20 03/02/2018   CREATININE 1.19 03/02/2018   BCR 17 03/02/2018   GFRAA 76 03/02/2018   GFRNONAA 66 03/02/2018                             Hepatic Lab Results  Component Value Date   AST 22 01/29/2018   ALT 35 01/29/2018   ALBUMIN 4.4 10/26/2017   ALKPHOS 147 (H) 10/26/2017                        Electrolytes Lab Results  Component Value Date   NA 137 03/02/2018   K 4.5 03/02/2018   CL 97 03/02/2018   CALCIUM 8.9 03/02/2018                        Neuropathy Lab Results  Component Value  Date   HGBA1C 9.7 (H) 03/02/2018   HIV Non Reactive 07/25/2017                        Coagulation Lab Results  Component Value Date   PLT 197 07/25/2017                        Cardiovascular Lab Results  Component Value Date   HGB 16.2 07/25/2017   HCT 49.6 07/25/2017  ID Lab Results  Component Value Date   HIV Non Reactive 07/25/2017   SARSCOV2NAA NOT DETECTED 07/20/2018     Endocrine Lab Results  Component Value Date   TSH 4.100 07/25/2017                        Note: Lab results reviewed.  PFSH  Drug: Mr. Vannote  reports no history of drug use. Alcohol:  reports previous alcohol use. Tobacco:  reports that he has quit smoking. His smoking use included cigarettes. He smoked 0.50 packs per day. His smokeless tobacco use includes chew. Medical:  has a past medical history of Asthma, Diabetes ([place]), Hyperlipidemia, Hypertension, and Legg-Perthes disease. Family: family history includes Cancer in his mother; Diabetes in his father; Heart disease in his mother.  Past Surgical History:  Procedure Laterality Date  . Foot Surgery    . HIP SURGERY    . RIGHT/LEFT HEART CATH AND CORONARY ANGIOGRAPHY N/A 07/25/2018   Procedure: RIGHT/LEFT HEART CATH AND CORONARY ANGIOGRAPHY;  Surgeon: Yolonda Kida, MD;  Location: [facility];  Service: Cardiovascular;  Laterality: N/A;   Active Ambulatory Problems    Diagnosis Date Noted  . Insulin dependent diabetes mellitus 04/21/2017  . Asthma 04/21/2017  . RLS (restless legs syndrome) 04/21/2017  . Allergic rhinitis 04/21/2017  . Essential hypertension 04/21/2017  . Major depression 04/21/2017  . Tremor 04/21/2017  . Hypogonadism male 04/21/2017  . Headache 04/21/2017  . Hyperlipidemia 02/03/2018  . GERD (gastroesophageal reflux disease) 11/12/2018  . Chronic bilateral low back pain with bilateral sciatica 12/06/2018  . Lumbar degenerative disc disease 12/06/2018  . Lumbar facet  arthropathy 12/06/2018  . Sacroiliac joint pain 12/06/2018  . Chronic pain syndrome 12/06/2018  . Bilateral primary osteoarthritis of knee 12/06/2018   Resolved Ambulatory Problems    Diagnosis Date Noted  . No Resolved Ambulatory Problems   Past Medical History:  Diagnosis Date  . Diabetes ([place])   . Hypertension   . Legg-Perthes disease    Constitutional Exam  General appearance: Well nourished, well developed, and well hydrated. In no apparent acute distress Vitals:   12/06/18 1003  BP: 115/64  Pulse: 94  Resp: 18  Temp: (!) 97 F (36.1 C)  TempSrc: Temporal  SpO2: 95%  Weight: 212 lb (96.2 kg)  Height: 5' 8"  (1.727 m)   BMI Assessment: Estimated body mass index is 32.23 kg/m as calculated from the following:   Height as of this encounter: 5' 8"  (1.727 m).   Weight as of this encounter: 212 lb (96.2 kg).  BMI interpretation table: BMI level Category Range association with higher incidence of chronic pain  <18 kg/m2 Underweight   18.5-24.9 kg/m2 Ideal body weight   25-29.9 kg/m2 Overweight Increased incidence by 20%  30-34.9 kg/m2 Obese (Class I) Increased incidence by 68%  35-39.9 kg/m2 Severe obesity (Class II) Increased incidence by 136%  >40 kg/m2 Extreme obesity (Class III) Increased incidence by 254%   Patient's current BMI Ideal Body weight  Body mass index is 32.23 kg/m. Ideal body weight: 68.4 kg (150 lb 12.7 oz) Adjusted ideal body weight: 79.5 kg (175 lb 4.4 oz)   BMI Readings from Last 4 Encounters:  12/06/18 32.23 kg/m  11/07/18 32.78 kg/m  09/04/18 33.07 kg/m  07/25/18 33.30 kg/m   Wt Readings from Last 4 Encounters:  12/06/18 212 lb (96.2 kg)  11/07/18 215 lb 9.6 oz (97.8 kg)  09/04/18 217 lb 8 oz (98.7 kg)  07/25/18 219 lb (99.3 kg)  Psych/Mental status: Alert, oriented x 3 (person, place, & time)       Eyes: PERLA Respiratory: No evidence of acute respiratory distress  Cervical Spine Area Exam  Skin & Axial Inspection: No masses,  redness, edema, swelling, or associated skin lesions Alignment: Symmetrical Functional ROM: Unrestricted ROM      Stability: No instability detected Muscle Tone/Strength: Functionally intact. No obvious neuro-muscular anomalies detected. Sensory (Neurological): Unimpaired Palpation: No palpable anomalies              Upper Extremity (UE) Exam    Side: Right upper extremity  Side: Left upper extremity  Skin & Extremity Inspection: Skin color, temperature, and hair growth are WNL. No peripheral edema or cyanosis. No masses, redness, swelling, asymmetry, or associated skin lesions. No contractures.  Skin & Extremity Inspection: Skin color, temperature, and hair growth are WNL. No peripheral edema or cyanosis. No masses, redness, swelling, asymmetry, or associated skin lesions. No contractures.  Functional ROM: Unrestricted ROM          Functional ROM: Unrestricted ROM          Muscle Tone/Strength: Functionally intact. No obvious neuro-muscular anomalies detected.  Muscle Tone/Strength: Functionally intact. No obvious neuro-muscular anomalies detected.  Sensory (Neurological): Unimpaired          Sensory (Neurological): Unimpaired          Palpation: No palpable anomalies              Palpation: No palpable anomalies              Provocative Test(s):  Phalen's test: deferred Tinel's test: deferred Apley's scratch test (touch opposite shoulder):  Action 1 (Across chest): deferred Action 2 (Overhead): deferred Action 3 (LB reach): deferred   Provocative Test(s):  Phalen's test: deferred Tinel's test: deferred Apley's scratch test (touch opposite shoulder):  Action 1 (Across chest): deferred Action 2 (Overhead): deferred Action 3 (LB reach): deferred    Thoracic Spine Area Exam  Skin & Axial Inspection: No masses, redness, or swelling Alignment: Symmetrical Functional ROM: Unrestricted ROM Stability: No instability detected Muscle Tone/Strength: Functionally intact. No obvious  neuro-muscular anomalies detected. Sensory (Neurological): Unimpaired Muscle strength & Tone: No palpable anomalies  Lumbar Spine Area Exam  Skin & Axial Inspection: No masses, redness, or swelling Alignment: Symmetrical Functional ROM: Pain restricted ROM affecting both sides Stability: No instability detected Muscle Tone/Strength: Functionally intact. No obvious neuro-muscular anomalies detected. Sensory (Neurological): Musculoskeletal pain pattern Palpation: No palpable anomalies       Provocative Tests: Hyperextension/rotation test: (+) bilaterally for facet joint pain. Lumbar quadrant test (Kemp's test): deferred today       Lateral bending test: deferred today       Patrick's Maneuver: (+) for bilateral S-I arthralgia             FABER* test: deferred today                   S-I anterior distraction/compression test: deferred today         S-I lateral compression test: deferred today         S-I Thigh-thrust test: deferred today         S-I Gaenslen's test: deferred today         *(Flexion, ABduction and External Rotation)  Gait & Posture Assessment  Ambulation: Unassisted Gait: Relatively normal for age and body habitus Posture: WNL   Lower Extremity Exam    Side: Right lower extremity  Side: Left lower extremity  Stability: No instability observed          Stability: No instability observed          Skin & Extremity Inspection: Skin color, temperature, and hair growth are WNL. No peripheral edema or cyanosis. No masses, redness, swelling, asymmetry, or associated skin lesions. No contractures.  Skin & Extremity Inspection: Skin color, temperature, and hair growth are WNL. No peripheral edema or cyanosis. No masses, redness, swelling, asymmetry, or associated skin lesions. No contractures.  Functional ROM: Unrestricted ROM                  Functional ROM: Unrestricted ROM                  Muscle Tone/Strength: Functionally intact. No obvious neuro-muscular anomalies  detected.  Muscle Tone/Strength: Functionally intact. No obvious neuro-muscular anomalies detected.  Sensory (Neurological): Arthropathic arthralgia        Sensory (Neurological): Arthropathic arthralgia        DTR:  Patellar: 1+: trace Achilles: deferred today Plantar: deferred today  DTR: Patellar: 1+: trace Achilles: deferred today Plantar: deferred today  Palpation: No palpable anomalies  Palpation: No palpable anomalies   Assessment  Primary Diagnosis & Pertinent Problem List: The primary encounter diagnosis was Chronic bilateral low back pain with bilateral sciatica. Diagnoses of Lumbar degenerative disc disease, Lumbar facet arthropathy, Sacroiliac joint pain, Chronic pain syndrome, Bilateral primary osteoarthritis of knee, and Diabetic mononeuropathy associated with type 2 diabetes mellitus ([place]) were also pertinent to this visit.  Visit Diagnosis (New problems to examiner): 1. Chronic bilateral low back pain with bilateral sciatica   2. Lumbar degenerative disc disease   3. Lumbar facet arthropathy   4. Sacroiliac joint pain   5. Chronic pain syndrome   6. Bilateral primary osteoarthritis of knee   7. Diabetic mononeuropathy associated with type 2 diabetes mellitus ([place])   General Recommendations: The pain condition that the patient suffers from is best treated with a multidisciplinary approach that involves an increase in physical activity to prevent de-conditioning and worsening of the pain cycle, as well as psychological counseling (formal and/or informal) to address the co-morbid psychological affects of pain. Treatment will often involve judicious use of pain medications and interventional procedures to decrease the pain, allowing the patient to participate in the physical activity that will ultimately produce long-lasting pain reductions. The goal of the multidisciplinary approach is to return the patient to a higher level of overall function and to restore their ability to  perform activities of daily living.   Recommend patient increase his Lyrica to 100 mg twice a day.  Continue amitriptyline as prescribed.   We will order x-rays of painful regions including spine x-rays, SI joint, bilateral knees and bilateral hips.  Can consider nonsteroid-based interventions such as intra-articular knee Hyalgan and/or genicular nerve block with local anesthetic only followed possibly by radiofrequency ablation.   Depending upon lumbar spine x-rays, if they show extensive lumbar facet arthropathy and lumbar degenerative disease, can consider diagnostic lumbar facet blocks as the patient does have pain with facet loading.  These would be with local anesthetic only as the patient does have elevated A1c would like to avoid steroid medications.  This could be followed by radiofrequency ablation.   Do not recommend opioid therapy.  Plan of Care (Initial workup plan)   Imaging Orders     DG Hip (Right)     DG Hip (Left)     DG Knee (Right)  DG Knee (Left)     DG Lumbar Spine Complete w/ Flex/Ext (6 Views)     DG Sacroiliac Joint X-Rays Pharmacotherapy (current): Medications ordered:  Meds ordered this encounter  Medications  . pregabalin (LYRICA) 100 MG capsule    Sig: Take 1 capsule (100 mg total) by mouth 2 (two) times daily.    Dispense:  60 capsule    Refill:  2  . tiZANidine (ZANAFLEX) 4 MG tablet    Sig: Take 1 tablet (4 mg total) by mouth 3 times/day as needed-between meals & bedtime for muscle spasms.    Dispense:  30 tablet    Refill:  2    Do not place this medication, or any other prescription from our practice, on "Automatic Refill". Patient may have prescription filled one day early if pharmacy is closed on scheduled refill date.   Medications administered during this visit: Lenna Sciara had no medications administered during this visit.   Pharmacological management options:  Opioid Analgesics: The patient was informed that there is no  guarantee that he would be a candidate for opioid analgesics. The decision will be made following CDC guidelines. This decision will be based on the results of diagnostic studies, as well as Mr. Alms risk profile.   Membrane stabilizer: Lyrica  Muscle relaxant: Tizanidne  NSAID: To be determined at a later time  Other analgesic(s): To be determined at a later time   Interventional management options: Mr. Rugg was informed that there is no guarantee that he would be a candidate for interventional therapies. The decision will be based on the results of diagnostic studies, as well as Mr. Savarese risk profile.  Procedure(s) under consideration:  Intra-articular knee Hyalgan Genicular nerve block with local anesthetic only Genicular nerve radiofrequency ablation Lumbar facet medial branch nerve block local anesthetic only Lumbar radiofrequency ablation    Provider-requested follow-up: Return in about 3 months (around 03/08/2019) for Medication Management, in person.  Future Appointments  Date Time Provider [place]  12/07/2018 10:00 AM [facility] PHARMACY [facility]  12/12/2018  1:30 PM [facility] CASE MANAGER [facility]  12/21/2018  9:45 AM [facility] SOCIAL WORK [facility]  12/24/2018 10:30 AM Volney American, PA-C [facility]  12/26/2018  9:00 AM Jonathon Bellows, MD [facility] None  03/05/2019 10:30 AM Gillis Santa, MD [facility] None    Primary Care Physician: Volney American, PA-C Location: [facility] Facility Note by: Gillis Santa, MD Date: 12/06/2018; Time: 11:16 AM  Note: This dictation was prepared with Dragon dictation. Any transcriptional errors that may result from this process are unintentional.

## 2018-12-06 NOTE — Progress Notes (Signed)
Safety precautions to be maintained throughout the outpatient stay will include: orient to surroundings, keep bed in low position, maintain call bell within reach at all times, provide assistance with transfer out of bed and ambulation.  

## 2018-12-06 NOTE — Patient Instructions (Signed)
A prescription for Tizanidine and Lyrica has been sent to your pharmacy.

## 2018-12-07 ENCOUNTER — Ambulatory Visit
Admission: RE | Admit: 2018-12-07 | Discharge: 2018-12-07 | Disposition: A | Discharge status: Home / Self Care | Payer: Medicare Other | Attending: Student in an Organized Health Care Education/Training Program | Admitting: Student in an Organized Health Care Education/Training Program

## 2018-12-07 ENCOUNTER — Ambulatory Visit
Admission: RE | Admit: 2018-12-07 | Discharge: 2018-12-07 | Disposition: A | Discharge status: Home / Self Care | Payer: Medicare Other | Source: Ambulatory Visit | Attending: Student in an Organized Health Care Education/Training Program | Admitting: Student in an Organized Health Care Education/Training Program

## 2018-12-07 ENCOUNTER — Telehealth: Payer: Self-pay

## 2018-12-07 ENCOUNTER — Ambulatory Visit: Payer: Self-pay | Admitting: Pharmacist

## 2018-12-07 DIAGNOSIS — J45909 Unspecified asthma, uncomplicated: Secondary | ICD-10-CM | POA: Diagnosis not present

## 2018-12-07 DIAGNOSIS — M1712 Unilateral primary osteoarthritis, left knee: Secondary | ICD-10-CM | POA: Diagnosis not present

## 2018-12-07 DIAGNOSIS — M1711 Unilateral primary osteoarthritis, right knee: Secondary | ICD-10-CM | POA: Diagnosis not present

## 2018-12-07 DIAGNOSIS — G8929 Other chronic pain: Secondary | ICD-10-CM

## 2018-12-07 DIAGNOSIS — M170 Bilateral primary osteoarthritis of knee: Secondary | ICD-10-CM | POA: Diagnosis not present

## 2018-12-07 DIAGNOSIS — M25551 Pain in right hip: Secondary | ICD-10-CM | POA: Diagnosis not present

## 2018-12-07 DIAGNOSIS — E1165 Type 2 diabetes mellitus with hyperglycemia: Secondary | ICD-10-CM

## 2018-12-07 DIAGNOSIS — M1611 Unilateral primary osteoarthritis, right hip: Secondary | ICD-10-CM | POA: Diagnosis not present

## 2018-12-07 DIAGNOSIS — M5442 Lumbago with sciatica, left side: Secondary | ICD-10-CM | POA: Diagnosis not present

## 2018-12-07 DIAGNOSIS — M5441 Lumbago with sciatica, right side: Secondary | ICD-10-CM | POA: Insufficient documentation

## 2018-12-07 DIAGNOSIS — M25552 Pain in left hip: Secondary | ICD-10-CM | POA: Diagnosis not present

## 2018-12-07 DIAGNOSIS — M47816 Spondylosis without myelopathy or radiculopathy, lumbar region: Secondary | ICD-10-CM | POA: Diagnosis not present

## 2018-12-07 DIAGNOSIS — Z794 Long term (current) use of insulin: Secondary | ICD-10-CM

## 2018-12-07 DIAGNOSIS — K0889 Other specified disorders of teeth and supporting structures: Secondary | ICD-10-CM

## 2018-12-07 NOTE — Chronic Care Management (AMB) (Signed)
Chronic Care Management   Note  12/07/2018 Name: Corey [name] MRN: [id number] DOB: 03/18/1955   Subjective:  Corey [name] is a 63 y.o. year old male who is a primary care patient of Volney American, [place]. The CCM team was consulted for assistance with chronic disease management and care coordination needs.    Contacted patient telephonically for medication management review  Mr. Knippenberg was given information about Chronic Care Management services today including:  1. CCM service includes personalized support from designated clinical staff supervised by his physician, including individualized plan of care and coordination with other care providers 2. 24/7 contact phone numbers for assistance for urgent and routine care needs. 3. Service will only be billed when office clinical staff spend 20 minutes or more in a month to coordinate care. 4. Only one practitioner may furnish and bill the service in a calendar month. 5. The patient may stop CCM services at any time (effective at the end of the month) by phone call to the office staff. 6. The patient will be responsible for cost sharing (co-pay) of up to 20% of the service fee (after annual deductible is met).  Patient agreed to services and verbal consent obtained.   Review of patient status, including review of consultants reports, laboratory and other test data, was performed as part of comprehensive evaluation and provision of chronic care management services.   Objective:  Lab Results  Component Value Date   CREATININE 1.19 03/02/2018   CREATININE 1.32 (H) 01/29/2018   CREATININE 1.06 10/26/2017    Lab Results  Component Value Date   HGBA1C 9.7 (H) 03/02/2018       Component Value Date/Time   CHOL 157 01/29/2018 1038   TRIG 292 (H) 01/29/2018 1038   HDL 38 (L) 07/25/2017 0910   VLDL 58 (H) 01/29/2018 1038   LDLCALC 73 07/25/2017 0910    Clinical ASCVD: No  The 10-year ASCVD risk score Mikey Bussing DC Jr., et  al., 2013) is: 15.9%   Values used to calculate the score:     Age: 60 years     Sex: Male     Is Non-Hispanic African American: No     Diabetic: Yes     Tobacco smoker: No     Systolic Blood Pressure: 537 mmHg     Is BP treated: Yes     HDL Cholesterol: 40 mg/dL     Total Cholesterol: 157 mg/dL    BP Readings from Last 3 Encounters:  12/06/18 115/64  11/12/18 (!) 144/83  11/07/18 (!) 142/70    No Known Allergies  Medications Reviewed Today    Reviewed by De Hollingshead, [place] (Pharmacist) on 12/07/18 at [facility] [place] List Status: <None>  Medication Order Taking? Sig Documenting Provider Last Dose Status Informant  Accu-Chek FastClix Lancets MISC [id number] Yes USE TO CHECK BLOOD SUGAR Volney American, [place] Taking Active   ACCU-CHEK GUIDE test strip [id number] Yes USE TO CHECK BLOOD SUGAR TID Volney American, PA-C Taking Active Self  acetaminophen (TYLENOL) 500 MG tablet [id number] No Take 1,000 mg by mouth every 6 (six) hours as needed for moderate pain or headache. [provider] Not Taking Active Self  albuterol (PROVENTIL) (2.5 MG/3ML) 0.083% nebulizer solution [id number] Yes Take 3 mLs (2.5 mg total) by nebulization every 6 (six) hours as needed for wheezing or shortness of breath. Volney American, PA-C Taking Active Self           Med Note (Dustine Stickler  E   Fri Dec 07, 2018 10:16 AM) Using more frequently recently   albuterol (VENTOLIN HFA) 108 (90 Base) MCG/ACT inhaler [id number] No INHALE 2 PUFFS INTO THE LUNGS EVERY 6 HOURS AS NEEDED FOR WHEEZING OR SHORTNESS OF BREATH  Patient not taking: Reported on 12/07/2018   Volney American, PA-C Not Taking Active   amitriptyline (ELAVIL) 50 MG tablet [id number] Yes Take 1-2 tablets (50-100 mg total) by mouth at bedtime. Volney American, [place] Taking Active   ammonium lactate (AMLACTIN) 12 % lotion [id number] No Apply to affected area twice daily as needed  Patient not taking: Reported on  12/07/2018   Gardiner Barefoot, DPM Not Taking Active Self  aspirin EC 81 MG tablet [id number] Yes Take 81 mg by mouth daily. [provider] Taking Active Self  azelastine (ASTELIN) 0.1 % nasal spray [id number] Yes Place 2 sprays into both nostrils 2 (two) times daily. Use in each nostril as directed Volney American, PA-C Taking Active   B-D UF III MINI PEN NEEDLES 31G X 5 MM MISC [id number] Yes USE TWICE DAILY Volney American, PA-C Taking Active Self  Blood Glucose Monitoring Suppl (ACCU-CHEK GUIDE) w/Device KIT [id number] Yes U UTD [provider] Taking Active Self  buPROPion (WELLBUTRIN XL) 150 MG 24 hr tablet [id number] Yes Take 150 mg by mouth daily. [provider] Taking Active   cetirizine (ZYRTEC) 10 MG tablet [id number] Yes Take 1 tablet (10 mg total) by mouth daily. Volney American, [place] Taking Active Self  Cholecalciferol (VITAMIN D3) 5000 units TABS [id number] Yes Take 5,000 Units by mouth daily.  [provider] Taking Active Self  citalopram (CELEXA) 10 MG tablet [id number] Yes TAKE 1 TABLET(10 MG) BY MOUTH DAILY Volney American, PA-C Taking Active Self  clopidogrel (PLAVIX) 75 MG tablet [id number] Yes Take by mouth. [provider] Taking Active   diclofenac sodium (VOLTAREN) 1 % GEL [id number] No Apply 1 application topically 4 (four) times daily as needed (pain).  [provider] Not Taking Active Self  Fluticasone-Salmeterol (ADVAIR) 500-50 MCG/DOSE AEPB [id number] Yes Inhale 1 puff into the lungs 2 (two) times daily. Volney American, [place] Taking Active Self  furosemide (LASIX) 20 MG tablet [id number] Yes TAKE 1 TABLET(20 MG) BY MOUTH TWICE DAILY Volney American, PA-C Taking Active   GLYXAMBI 25-5 [place] TABS [id number] Yes TAKE 1 TABLET BY MOUTH IN  THE MORNING Volney American, PA-C Taking Active   Insulin Syringe-Needle U-100 30G X 1/2" 1 ML MISC [id number] Yes 1 Units by Does not apply route daily.  Volney American, PA-C Taking Active Self  Lancets Misc. (ACCU-CHEK FASTCLIX LANCET) KIT [id number] Yes 1 Units by Does not apply route 2 (two) times daily. Volney American, [place] Taking Active Self  LANTUS SOLOSTAR 100 UNIT/ML Solostar Pen [id number] Yes INJECT 75 UNITS [place] D.  Patient taking differently: 80 Units. INJECT 75 UNITS [place] D.   Volney American, [place] Taking Active   losartan (COZAAR) 50 MG tablet [id number] Yes Take 50 mg by mouth daily. [provider] Taking Active   magnesium gluconate (MAGONATE) 500 MG tablet [id number] Yes Take 500 mg by mouth daily. [provider] Taking Active Self  meloxicam (MOBIC) 7.5 MG tablet [id number] Yes  [provider] Taking Active   metFORMIN (GLUCOPHAGE) 1000 MG tablet [id number] Yes TAKE 1 TABLET(1000 MG) BY MOUTH TWICE DAILY Volney American, PA-C Taking Active   metoprolol succinate (TOPROL-XL) 25 MG 24 hr tablet [id number] Yes Take  25 mg by mouth daily. [provider] Taking Active   montelukast (SINGULAIR) 10 MG tablet [id number] Yes TAKE 1 TABLET(10 MG) BY MOUTH DAILY Trostle, Megan P, DO Taking Active   OZEMPIC, 0.25 OR 0.5 MG/DOSE, 2 MG/1.5ML SOPN [id number] Yes INJECT 0.25 MG UNDER THE SKIN ONCE PER WEEK  Patient taking differently: Inject 0.5 mg into the skin once a week.    Volney American, [place] Taking Active   pantoprazole (PROTONIX) 40 MG tablet [id number] Yes Take 40 mg by mouth daily. [provider] Taking Active   pregabalin (LYRICA) 100 MG capsule [id number] Yes Take 1 capsule (100 mg total) by mouth 2 (two) times daily. Gillis Santa, MD Taking Active   primidone (MYSOLINE) 50 MG tablet [id number] Yes TAKE 2 TABLETS(100 MG) BY MOUTH DAILY Volney American, PA-C Taking Active   sucralfate (CARAFATE) 1 g tablet [id number] Yes TAKE 1 TABLET(1 GRAM) BY MOUTH THREE TIMES DAILY AS NEEDED Volney American, PA-C Taking Active            Med Note Darnelle Maffucci, Arville Lime   Fri Dec 07, 2018 10:31 AM) BID   tiZANidine (ZANAFLEX) 4 MG tablet [id number] Yes Take 1 tablet (4 mg total) by mouth 3 times/day as needed-between meals & bedtime for muscle spasms. Gillis Santa, MD Taking Active            Med Note Darnelle Maffucci, Arville Lime   Fri Dec 07, 2018 10:19 AM) Using once daily           Assessment:   Goals Addressed            This Visit's Progress     Patient Stated   . PharmD "I want to take care of my sugars" (pt-stated)       Current Barriers:  . Diabetes: uncontrolled; most recent A1c 9.0%; follows w/ [name] o Does note that he has been trying to establish w/ a dentist, but has not heard anything back yet  . Current antihyperglycemic regimen: metformin 1000 mg BID, Ozempic 1 mg weekly, Glyxambi 25/5 mg daily, Lantus 80 units daily . Current meal patterns: o Breakfast: Eggs + bacon/sausage;  o Lunch: generally skips o Snacks: occasionally after supper; pecans;  o Drinks: Diet pepsi; occasional regular sodas; uses splenda in his tea;  o Notes that he has switched to whole wheat bread, and is trying to cut back on high starch foods . Current exercise: none, limited by chronic lower back pain . Current blood glucose readings:  o Fasting: 90s-200; notes that highly dependent on what he eats . Cardiovascular risk reduction: o Current hypertensive regimen: losartan 50 mg daily, metoprolol succinate 25 mg daily o Current hyperlipidemia regimen: rosuvastatin 20 mg daily  Pharmacist Clinical Goal(s):  Marland Kitchen Over the next 90 days, patient with work with PharmD and primary care provider to address optimized glycemic management  Interventions: . Comprehensive medication review performed, medication list updated in electronic medical record . Congratulated patient on improvement in blood sugars.  . Discussed goal A1c, fasting glucose, and 2 hour post prandial glucose . Reviewed referral for dentist. Can see that it was faxed to [facility], however, we had incorrect phone number on file for patient. [place] to provide updated information, they noted that they have no information on file for patient. Sent message to referral person Lionel December asking to re-send referral with updated contact information.  . Patient asks about getting more Glucerna samples; will collaborate w/  Janci Minor, RN and Tyler Aas, LPN on this  Patient Self Care Activities:  . Patient will check blood glucose BID, document, and provide at future appointments . Patient will take medications as prescribed . Patient will report any questions or concerns to provider   Please see past updates related to this goal by clicking on the "Past Updates" button in the selected goal         Plan: - Will outreach patient in the next 4-5 weeks for continued medication management support   Catie Darnelle Maffucci, PharmD Clinical Pharmacist [place] [phone]

## 2018-12-07 NOTE — Patient Instructions (Addendum)
Corey [name],   It was great talking with you today!  Bring all of your supplements to your appointment with Apolonio Schneiders, just so that we can make sure you aren't taking anything that interacts with any of your other medications  Here is what each of your medications is for:   Diabetes: - Metformin 1000 mg twice daily - Glyxambi 25/5 mg daily - Ozempic 1 mg weekly - Lantus 80 units daily  Heart/Blood Pressure: - Losartan 50 mg daily - Metoprolol succinate 25 mg daily - Furosemide 20 mg twice daily  Stroke/Clot Prevention:  - Aspirin 81 mg daily - Clopidogrel (Plavix) 75 mg daily  Cholesterol: - Rosuvastatin 20 mg daily   Pain/Sleep/Mood: - Bupropion XL 150 mg daily - Amitriptyline 100 mg at bedtime - Citalopram 10 mg daily - Pregabalin 100 mg twice daily  - Meloxicam 7.5 mg daily   Tremor: - Primidone 100 mg daily  Allergies: - Cetirizine (Zyrtec) 10 mg daily - Azelastine nasal spray - Montelukast (Singular) 19 mg dfaily   Acid Reflux: - Sucralfate 1 g twice daily - Pantoprazole 40 mg daily   Asthma: - Advair twice daily - Albuterol inhaler or nebulizer as needed for shortness of breath, wheezing.    Call me if you have any questions or concerns about your medications!  Visit Information  Goals Addressed            This Visit's Progress     Patient Stated   . PharmD "I want to take care of my sugars" (pt-stated)       Current Barriers:  . Diabetes: uncontrolled; most recent A1c 9.0%; follows w/ [name] o Does note that he has been trying to establish w/ a dentist, but has not heard anything back yet  . Current antihyperglycemic regimen: metformin 1000 mg BID, Ozempic 1 mg weekly, Glyxambi 25/5 mg daily, Lantus 80 units daily . Current meal patterns: o Breakfast: Eggs + bacon/sausage;  o Lunch: generally skips o Snacks: occasionally after supper; pecans;  o Drinks: Diet pepsi; occasional regular sodas; uses splenda in his tea;   o Notes that he has switched to whole wheat bread, and is trying to cut back on high starch foods . Current exercise: none, limited by chronic lower back pain . Current blood glucose readings:  o Fasting: 90s-200; notes that highly dependent on what he eats . Cardiovascular risk reduction: o Current hypertensive regimen: losartan 50 mg daily, metoprolol succinate 25 mg daily o Current hyperlipidemia regimen: rosuvastatin 20 mg daily  Pharmacist Clinical Goal(s):  Marland Kitchen Over the next 90 days, patient with work with PharmD and primary care provider to address optimized glycemic management  Interventions: . Comprehensive medication review performed, medication list updated in electronic medical record . Congratulated patient on improvement in blood sugars.  . Discussed goal A1c, fasting glucose, and 2 hour post prandial glucose . Reviewed referral for dentist. Can see that it was faxed to [facility], however, we had incorrect phone number on file for patient. [place] to provide updated information, they noted that they have no information on file for patient. Sent message to referral person Lionel December asking to re-send referral with updated contact information.  . Patient asks about getting more Glucerna samples; will collaborate w/ Merlene Morse Minor, RN and Tyler Aas, LPN on this  Patient Self Care Activities:  . Patient will check blood glucose BID, document, and provide at future appointments . Patient will take medications as prescribed . Patient will report  any questions or concerns to provider   Please see past updates related to this goal by clicking on the "Past Updates" button in the selected goal         Corey [name] was given information about Chronic Care Management services today including:  1. CCM service includes personalized support from designated clinical staff supervised by his physician, including individualized plan of care and coordination with  other care providers 2. 24/7 contact phone numbers for assistance for urgent and routine care needs. 3. Service will only be billed when office clinical staff spend 20 minutes or more in a month to coordinate care. 4. Only one practitioner may furnish and bill the service in a calendar month. 5. The patient may stop CCM services at any time (effective at the end of the month) by phone call to the office staff. 6. The patient will be responsible for cost sharing (co-pay) of up to 20% of the service fee (after annual deductible is met).  Patient agreed to services and verbal consent obtained.   The patient verbalized understanding of instructions provided today and declined a print copy of patient instruction materials.   Plan: - Will outreach patient in the next 4-5 weeks for continued medication management support   Catie Darnelle Maffucci, PharmD Clinical Pharmacist [place] [phone]

## 2018-12-10 ENCOUNTER — Other Ambulatory Visit: Payer: Self-pay | Admitting: Family Medicine

## 2018-12-10 NOTE — Telephone Encounter (Signed)
Routing to provider  

## 2018-12-10 NOTE — Telephone Encounter (Signed)
Requested medication (s) are due for refill today: yes  Requested medication (s) are on the active medication list:  yes  Last refill:  11/12/2018  Future visit scheduled: yes  Notes to clinic:  Last filled by historical provider    Requested Prescriptions  Pending Prescriptions Disp Refills   buPROPion (WELLBUTRIN XL) 150 MG 24 hr tablet [Pharmacy Med Name: BUPROPION XL 150MG  TABLETS (24 H)] 30 tablet     Sig: TAKE 1 TABLET(150 MG) BY MOUTH DAILY     Psychiatry: Antidepressants - bupropion Passed - 12/10/2018  3:34 AM      Passed - Last BP in normal range    BP Readings from Last 1 Encounters:  12/06/18 115/64         Passed - Valid encounter within last 6 months    Recent Outpatient Visits          4 weeks ago Pain, dental   [facility] Volney American, PA-C   3 months ago Essential hypertension   [place], Mark A, MD   5 months ago Insulin dependent diabetes mellitus [facility])   [place], [place], [place]   8 months ago Insulin dependent diabetes mellitus [facility])   [place], [place], [place]   9 months ago Insulin dependent diabetes mellitus [facility])   [facility] Volney American, [place]      Future Appointments            In 2 weeks Orene Desanctis, Lilia Argue, PA-C [place], [place] - Completed PHQ-2 or PHQ-9 in the last 360 days.

## 2018-12-11 ENCOUNTER — Ambulatory Visit: Payer: Self-pay | Admitting: Pharmacist

## 2018-12-11 ENCOUNTER — Other Ambulatory Visit: Payer: Self-pay | Admitting: Family Medicine

## 2018-12-11 DIAGNOSIS — I200 Unstable angina: Secondary | ICD-10-CM | POA: Diagnosis not present

## 2018-12-11 DIAGNOSIS — I208 Other forms of angina pectoris: Secondary | ICD-10-CM | POA: Diagnosis not present

## 2018-12-11 DIAGNOSIS — R0602 Shortness of breath: Secondary | ICD-10-CM | POA: Diagnosis not present

## 2018-12-11 DIAGNOSIS — I25118 Atherosclerotic heart disease of native coronary artery with other forms of angina pectoris: Secondary | ICD-10-CM | POA: Diagnosis not present

## 2018-12-11 DIAGNOSIS — R079 Chest pain, unspecified: Secondary | ICD-10-CM | POA: Diagnosis not present

## 2018-12-11 NOTE — Chronic Care Management (AMB) (Signed)
  Chronic Care Management   Note  12/11/2018 Name: DOREAN DANIELLO MRN: [id number] DOB: 1955-08-10  Corey [name] is a 63 y.o. year old male who is a primary care patient of Volney American, [place]. The CCM team was consulted for assistance with chronic disease management and care coordination needs.    Attempted to contact patient on all listed numbers to share information regarding the dental appointment scheduled for him for tomorrow. Scheduling team sent a MyChart message, however, this has not been checked yet.   Follow up plan: - Will attempt to outreach patient as scheduled  Catie Darnelle Maffucci, PharmD Clinical Pharmacist [place] [phone]

## 2018-12-12 ENCOUNTER — Telehealth: Payer: Self-pay

## 2018-12-12 DIAGNOSIS — E785 Hyperlipidemia, unspecified: Secondary | ICD-10-CM | POA: Diagnosis not present

## 2018-12-12 DIAGNOSIS — J45909 Unspecified asthma, uncomplicated: Secondary | ICD-10-CM | POA: Diagnosis not present

## 2018-12-12 DIAGNOSIS — Z794 Long term (current) use of insulin: Secondary | ICD-10-CM | POA: Diagnosis not present

## 2018-12-12 DIAGNOSIS — E1165 Type 2 diabetes mellitus with hyperglycemia: Secondary | ICD-10-CM | POA: Diagnosis not present

## 2018-12-12 DIAGNOSIS — E1169 Type 2 diabetes mellitus with other specified complication: Secondary | ICD-10-CM | POA: Diagnosis not present

## 2018-12-18 ENCOUNTER — Ambulatory Visit: Payer: Self-pay | Admitting: Pharmacist

## 2018-12-18 DIAGNOSIS — Z794 Long term (current) use of insulin: Secondary | ICD-10-CM

## 2018-12-18 DIAGNOSIS — K0889 Other specified disorders of teeth and supporting structures: Secondary | ICD-10-CM

## 2018-12-18 DIAGNOSIS — E1165 Type 2 diabetes mellitus with hyperglycemia: Secondary | ICD-10-CM

## 2018-12-18 NOTE — Patient Instructions (Addendum)
It was great talking to you today!  Here is how I would fill your pill box:   Morning:  - Bupropion - Cetirizine - Vitamin D - Citalopram - Clopidogrel - Aspirin - Furosemide - Glyxambi - Metformin - Metoprolol - Pantoprazole - Pregabalin  Evening/Bedtime - Amitriptyline - Metformin - Losartan - Montelukast - Pregabalin - Primidone - Rosuvastatin  The sucralfate needs to be taken on an empty stomach, at least 1 hour before a meal. You need to wait at least 2 hours after taking the sucralfate to take the other medications.   Please call with any questions!  Catie Darnelle Maffucci, PharmD [phone]  Visit Information  Goals Addressed            This Visit's Progress     Patient Stated   . PharmD "I want to take care of my sugars" (pt-stated)       Current Barriers:  . Diabetes: uncontrolled; most recent A1c 9.0%; follows w/ [name] o Notes his phone was off last week, he did not receive notice of the dentist appointment scheduled for him. o Requests a BID pill box. . Current antihyperglycemic regimen: metformin 1000 mg BID, Ozempic 1 mg weekly, Glyxambi 25/5 mg daily, Lantus 80 units daily . Current meal patterns: o Breakfast: Eggs + bacon/sausage;  o Lunch: generally skips o Snacks: occasionally after supper; pecans;  o Drinks: Diet pepsi; occasional regular sodas; uses splenda in his tea;  o Notes that he has switched to whole wheat bread, and is trying to cut back on high starch foods . Current exercise: none, limited by chronic lower back pain . Current blood glucose readings:  o Fasting: 90s-200; notes that highly dependent on what he eats . Cardiovascular risk reduction: o Current hypertensive regimen: losartan 50 mg daily, metoprolol succinate 25 mg daily o Current hyperlipidemia regimen: rosuvastatin 20 mg daily  Pharmacist Clinical Goal(s):  Marland Kitchen Over the next 90 days, patient with work with PharmD and primary care provider to address  optimized glycemic management  Interventions: . Provided patient with phone number for [place] to call and reschedule an appointment . Mailing patient a BID pill box, along with list of which medication to put in which time.   Patient Self Care Activities:  . Patient will check blood glucose BID, document, and provide at future appointments . Patient will take medications as prescribed . Patient will report any questions or concerns to provider   Please see past updates related to this goal by clicking on the "Past Updates" button in the selected goal         Print copy of patient instructions provided.   Plan: - Will outreach patient for follow up as scheduled  Catie Darnelle Maffucci, PharmD Clinical Pharmacist [place] [phone]

## 2018-12-18 NOTE — Chronic Care Management (AMB) (Signed)
Chronic Care Management   Follow Up Note   12/18/2018 Name: Corey [name] MRN: [id number] DOB: 10-24-1955  Referred by: Volney American, PA-C Reason for referral : Chronic Care Management (Medication Management)   Corey [name] is a 63 y.o. year old male who is a primary care patient of Volney American, [place]. The CCM team was consulted for assistance with chronic disease management and care coordination needs.    Received call from patient today.   Review of patient status, including review of consultants reports, relevant laboratory and other test results, and collaboration with appropriate care team members and the patient's provider was performed as part of comprehensive patient evaluation and provision of chronic care management services.    SDOH (Social Determinants of Health) screening performed today: Financial Strain . See Care Plan for related entries.   Outpatient Encounter Medications as of 12/18/2018  Medication Sig Note  . Accu-Chek FastClix Lancets MISC USE TO CHECK BLOOD SUGAR   . ACCU-CHEK GUIDE test strip USE TO CHECK BLOOD SUGAR TID   . acetaminophen (TYLENOL) 500 MG tablet Take 1,000 mg by mouth every 6 (six) hours as needed for moderate pain or headache.   . albuterol (PROVENTIL) (2.5 MG/3ML) 0.083% nebulizer solution Take 3 mLs (2.5 mg total) by nebulization every 6 (six) hours as needed for wheezing or shortness of breath. 12/07/2018: Using more frequently recently   . albuterol (VENTOLIN HFA) 108 (90 Base) MCG/ACT inhaler INHALE 2 PUFFS INTO THE LUNGS EVERY 6 HOURS AS NEEDED FOR WHEEZING OR SHORTNESS OF BREATH (Patient not taking: Reported on 12/07/2018)   . amitriptyline (ELAVIL) 50 MG tablet Take 1-2 tablets (50-100 mg total) by mouth at bedtime.   Marland Kitchen ammonium lactate (AMLACTIN) 12 % lotion Apply to affected area twice daily as needed (Patient not taking: Reported on 12/07/2018)   . aspirin EC 81 MG tablet Take 81 mg by mouth daily.   Marland Kitchen  azelastine (ASTELIN) 0.1 % nasal spray Place 2 sprays into both nostrils 2 (two) times daily. Use in each nostril as directed   . B-D UF III MINI PEN NEEDLES 31G X 5 MM MISC USE TWICE DAILY   . Blood Glucose Monitoring Suppl (ACCU-CHEK GUIDE) w/Device KIT U UTD   . buPROPion (WELLBUTRIN XL) 150 MG 24 hr tablet TAKE 1 TABLET(150 MG) BY MOUTH DAILY   . cetirizine (ZYRTEC) 10 MG tablet Take 1 tablet (10 mg total) by mouth daily.   . Cholecalciferol (VITAMIN D3) 5000 units TABS Take 5,000 Units by mouth daily.    . citalopram (CELEXA) 10 MG tablet TAKE 1 TABLET BY MOUTH  DAILY   . clopidogrel (PLAVIX) 75 MG tablet Take by mouth.   . diclofenac sodium (VOLTAREN) 1 % GEL Apply 1 application topically 4 (four) times daily as needed (pain).    . Fluticasone-Salmeterol (ADVAIR) 500-50 MCG/DOSE AEPB Inhale 1 puff into the lungs 2 (two) times daily.   . furosemide (LASIX) 20 MG tablet TAKE 1 TABLET(20 MG) BY MOUTH TWICE DAILY   . GLYXAMBI 25-5 MG TABS TAKE 1 TABLET BY MOUTH IN  THE MORNING   . Insulin Syringe-Needle U-100 30G X 1/2" 1 ML MISC 1 Units by Does not apply route daily.   . Lancets Misc. (ACCU-CHEK FASTCLIX LANCET) KIT 1 Units by Does not apply route 2 (two) times daily.   Marland Kitchen LANTUS SOLOSTAR 100 UNIT/ML Solostar Pen INJECT 75 UNITS [place] D. (Patient taking differently: 80 Units. INJECT 75 UNITS [place] D.)   . losartan (  COZAAR) 50 MG tablet Take 50 mg by mouth daily.   . magnesium gluconate (MAGONATE) 500 MG tablet Take 500 mg by mouth daily.   . meloxicam (MOBIC) 7.5 MG tablet    . metFORMIN (GLUCOPHAGE) 1000 MG tablet TAKE 1 TABLET BY MOUTH  TWICE DAILY   . metoprolol succinate (TOPROL-XL) 25 MG 24 hr tablet Take 25 mg by mouth daily.   . montelukast (SINGULAIR) 10 MG tablet TAKE 1 TABLET(10 MG) BY MOUTH DAILY   . pantoprazole (PROTONIX) 40 MG tablet Take 40 mg by mouth daily.   . pregabalin (LYRICA) 100 MG capsule Take 1 capsule (100 mg total) by mouth 2 (two) times daily.   . primidone (MYSOLINE)  50 MG tablet TAKE 2 TABLETS(100 MG) BY MOUTH DAILY   . rosuvastatin (CRESTOR) 20 MG tablet Take 20 mg by mouth daily.   . Semaglutide, 1 MG/DOSE, (OZEMPIC, 1 MG/DOSE,) 2 MG/1.5ML SOPN Inject 1 mg into the skin once a week.   . sucralfate (CARAFATE) 1 g tablet TAKE 1 TABLET(1 GRAM) BY MOUTH THREE TIMES DAILY AS NEEDED 12/07/2018: BID   . tiZANidine (ZANAFLEX) 4 MG tablet Take 1 tablet (4 mg total) by mouth 3 times/day as needed-between meals & bedtime for muscle spasms. 12/07/2018: Using once daily   No facility-administered encounter medications on file as of 12/18/2018.      Goals Addressed            This Visit's Progress     Patient Stated   . PharmD "I want to take care of my sugars" (pt-stated)       Current Barriers:  . Diabetes: uncontrolled; most recent A1c 9.0%; follows w/ [name] o Notes his phone was off last week, he did not receive notice of the dentist appointment scheduled for him. o Requests a BID pill box. . Current antihyperglycemic regimen: metformin 1000 mg BID, Ozempic 1 mg weekly, Glyxambi 25/5 mg daily, Lantus 80 units daily . Current meal patterns: o Breakfast: Eggs + bacon/sausage;  o Lunch: generally skips o Snacks: occasionally after supper; pecans;  o Drinks: Diet pepsi; occasional regular sodas; uses splenda in his tea;  o Notes that he has switched to whole wheat bread, and is trying to cut back on high starch foods . Current exercise: none, limited by chronic lower back pain . Current blood glucose readings:  o Fasting: 90s-200; notes that highly dependent on what he eats . Cardiovascular risk reduction: o Current hypertensive regimen: losartan 50 mg daily, metoprolol succinate 25 mg daily o Current hyperlipidemia regimen: rosuvastatin 20 mg daily  Pharmacist Clinical Goal(s):  Marland Kitchen Over the next 90 days, patient with work with PharmD and primary care provider to address optimized glycemic management  Interventions: . Provided  patient with phone number for [facility] to call and reschedule an appointment . Mailing patient a BID pill box, along with list of which medication to put in which time.   Patient Self Care Activities:  . Patient will check blood glucose BID, document, and provide at future appointments . Patient will take medications as prescribed . Patient will report any questions or concerns to provider   Please see past updates related to this goal by clicking on the "Past Updates" button in the selected goal          Plan: - Will outreach patient for follow up as scheduled  Catie Darnelle Maffucci, PharmD Clinical Pharmacist [place] [phone]

## 2018-12-19 DIAGNOSIS — E1142 Type 2 diabetes mellitus with diabetic polyneuropathy: Secondary | ICD-10-CM | POA: Diagnosis not present

## 2018-12-19 DIAGNOSIS — E1165 Type 2 diabetes mellitus with hyperglycemia: Secondary | ICD-10-CM | POA: Diagnosis not present

## 2018-12-19 DIAGNOSIS — E1159 Type 2 diabetes mellitus with other circulatory complications: Secondary | ICD-10-CM | POA: Diagnosis not present

## 2018-12-19 DIAGNOSIS — E1169 Type 2 diabetes mellitus with other specified complication: Secondary | ICD-10-CM | POA: Diagnosis not present

## 2018-12-21 ENCOUNTER — Telehealth: Payer: Self-pay

## 2018-12-24 ENCOUNTER — Ambulatory Visit (INDEPENDENT_AMBULATORY_CARE_PROVIDER_SITE_OTHER): Payer: Medicare Other | Admitting: Family Medicine

## 2018-12-24 ENCOUNTER — Encounter: Payer: Self-pay | Admitting: Family Medicine

## 2018-12-24 ENCOUNTER — Other Ambulatory Visit: Payer: Self-pay

## 2018-12-24 VITALS — BP 105/77 | HR 75 | Temp 98.1°F | Ht 66.5 in | Wt 219.0 lb

## 2018-12-24 DIAGNOSIS — Z794 Long term (current) use of insulin: Secondary | ICD-10-CM

## 2018-12-24 DIAGNOSIS — F3341 Major depressive disorder, recurrent, in partial remission: Secondary | ICD-10-CM

## 2018-12-24 DIAGNOSIS — B351 Tinea unguium: Secondary | ICD-10-CM | POA: Diagnosis not present

## 2018-12-24 DIAGNOSIS — M5441 Lumbago with sciatica, right side: Secondary | ICD-10-CM

## 2018-12-24 DIAGNOSIS — Z0000 Encounter for general adult medical examination without abnormal findings: Secondary | ICD-10-CM | POA: Diagnosis not present

## 2018-12-24 DIAGNOSIS — M5442 Lumbago with sciatica, left side: Secondary | ICD-10-CM

## 2018-12-24 DIAGNOSIS — E7800 Pure hypercholesterolemia, unspecified: Secondary | ICD-10-CM

## 2018-12-24 DIAGNOSIS — I10 Essential (primary) hypertension: Secondary | ICD-10-CM | POA: Diagnosis not present

## 2018-12-24 DIAGNOSIS — E782 Mixed hyperlipidemia: Secondary | ICD-10-CM

## 2018-12-24 DIAGNOSIS — E1165 Type 2 diabetes mellitus with hyperglycemia: Secondary | ICD-10-CM

## 2018-12-24 DIAGNOSIS — G8929 Other chronic pain: Secondary | ICD-10-CM

## 2018-12-24 DIAGNOSIS — E1142 Type 2 diabetes mellitus with diabetic polyneuropathy: Secondary | ICD-10-CM | POA: Diagnosis not present

## 2018-12-24 DIAGNOSIS — K219 Gastro-esophageal reflux disease without esophagitis: Secondary | ICD-10-CM

## 2018-12-24 DIAGNOSIS — J4540 Moderate persistent asthma, uncomplicated: Secondary | ICD-10-CM

## 2018-12-24 DIAGNOSIS — R531 Weakness: Secondary | ICD-10-CM

## 2018-12-24 DIAGNOSIS — Z125 Encounter for screening for malignant neoplasm of prostate: Secondary | ICD-10-CM

## 2018-12-24 LAB — UA/M W/RFLX CULTURE, ROUTINE
Bilirubin, UA: NEGATIVE
Ketones, UA: NEGATIVE
Leukocytes,UA: NEGATIVE
Nitrite, UA: NEGATIVE
Protein,UA: NEGATIVE
RBC, UA: NEGATIVE
Specific Gravity, UA: 1.015 (ref 1.005–1.030)
Urobilinogen, Ur: 0.2 mg/dL (ref 0.2–1.0)
pH, UA: 6 (ref 5.0–7.5)

## 2018-12-24 NOTE — Progress Notes (Signed)
BP 105/77   Pulse 75   Temp 98.1 F (36.7 C) (Oral)   Ht 5' 6.5" (1.689 m)   Wt 219 lb (99.3 kg)   SpO2 95%   BMI 34.82 kg/m    Subjective:    Patient ID: Corey [name], male    DOB: [birth year], 63 y.o.   MRN: [id number]  HPI: Corey [name] is a 63 y.o. male presenting on 12/24/2018 for comprehensive medical examination. Current medical complaints include:see below  Taking wellbutrin for weight management, states this is going well and seems to be tolerated well. Trying to eat smaller portions and watch his carbs closer.   Following with Endocrinology for his DM. Eye exam this fall, has record at home that he will bring.   Following with Pain Management for chronic back pain. On lyrica and meloxicam daily which do seem to help. Notes he has a hard time running errands out in public due to pain and overall weakness. Wife recently able to obtain a motorized scooter device for this reason which she has found very helpful, and he is requesting one as well.   Depression - on elavil at bedtime for sleep and mood control. States this seems to be working well. Denies SI/HI.   Asthma - Following with Pulmonology, doing well on current inhaler regimen. No recent exacerbations.   Seeing Cardiology regularly for CAD s/p PCI and stent 08/2018. BPs have been stable on current regimen when checked at home. Denies ongoing CP sxs.   Seeing GI in 2 months for colonoscpy eval.   He currently lives with: Interim Problems from his last visit: no  Depression Screen done today and results listed below:  Depression screen [facility] 2/9 12/24/2018 12/06/2018 11/07/2018 06/21/2018 10/26/2017  Decreased Interest 0 0 0 0 0  Down, Depressed, Hopeless 0 0 0 0 0  PHQ - 2 Score 0 0 0 0 0  Altered sleeping 0 - - 0 -  Tired, decreased energy 0 - - 0 -  Change in appetite 0 - - 0 -  Feeling bad or failure about yourself  0 - - 0 -  Trouble concentrating 0 - - 0 -  Moving slowly or fidgety/restless 0 - - 0 -   Suicidal thoughts 0 - - 0 -  PHQ-9 Score 0 - - 0 -  Difficult doing work/chores - - - Not difficult at all -    The patient does not have a history of falls. I did complete a risk assessment for falls. A plan of care for falls was documented.   Past Medical History:  Past Medical History:  Diagnosis Date  . Asthma   . Diabetes ([place])   . Hyperlipidemia   . Hypertension   . Legg-Perthes disease     Surgical History:  Past Surgical History:  Procedure Laterality Date  . Foot Surgery    . HIP SURGERY    . RIGHT/LEFT HEART CATH AND CORONARY ANGIOGRAPHY N/A 07/25/2018   Procedure: RIGHT/LEFT HEART CATH AND CORONARY ANGIOGRAPHY;  Surgeon: Yolonda Kida, MD;  Location: [facility];  Service: Cardiovascular;  Laterality: N/A;    Medications:  Current Outpatient Medications on File Prior to Visit  Medication Sig  . Accu-Chek FastClix Lancets MISC USE TO CHECK BLOOD SUGAR  . ACCU-CHEK GUIDE test strip USE TO CHECK BLOOD SUGAR TID  . acetaminophen (TYLENOL) 500 MG tablet Take 1,000 mg by mouth every 6 (six) hours as needed for moderate pain or headache.  Marland Kitchen  albuterol (PROVENTIL) (2.5 MG/3ML) 0.083% nebulizer solution Take 3 mLs (2.5 mg total) by nebulization every 6 (six) hours as needed for wheezing or shortness of breath.  Marland Kitchen albuterol (VENTOLIN HFA) 108 (90 Base) MCG/ACT inhaler INHALE 2 PUFFS INTO THE LUNGS EVERY 6 HOURS AS NEEDED FOR WHEEZING OR SHORTNESS OF BREATH  . amitriptyline (ELAVIL) 50 MG tablet Take 1-2 tablets (50-100 mg total) by mouth at bedtime.  Marland Kitchen aspirin EC 81 MG tablet Take 81 mg by mouth daily.  Marland Kitchen azelastine (ASTELIN) 0.1 % nasal spray Place 2 sprays into both nostrils 2 (two) times daily. Use in each nostril as directed  . B-D UF III MINI PEN NEEDLES 31G X 5 MM MISC USE TWICE DAILY  . Blood Glucose Monitoring Suppl (ACCU-CHEK GUIDE) w/Device KIT U UTD  . buPROPion (WELLBUTRIN XL) 150 MG 24 hr tablet TAKE 1 TABLET(150 MG) BY MOUTH DAILY  . cetirizine  (ZYRTEC) 10 MG tablet Take 1 tablet (10 mg total) by mouth daily.  . Cholecalciferol (VITAMIN D3) 5000 units TABS Take 5,000 Units by mouth daily.   . citalopram (CELEXA) 10 MG tablet TAKE 1 TABLET BY MOUTH  DAILY  . clopidogrel (PLAVIX) 75 MG tablet Take by mouth.  . diclofenac sodium (VOLTAREN) 1 % GEL Apply 1 application topically 4 (four) times daily as needed (pain).   . Fluticasone-Salmeterol (ADVAIR) 500-50 MCG/DOSE AEPB Inhale 1 puff into the lungs 2 (two) times daily.  . furosemide (LASIX) 20 MG tablet TAKE 1 TABLET(20 MG) BY MOUTH TWICE DAILY  . GLYXAMBI 25-5 MG TABS TAKE 1 TABLET BY MOUTH IN  THE MORNING  . Insulin Syringe-Needle U-100 30G X 1/2" 1 ML MISC 1 Units by Does not apply route daily.  . Lancets Misc. (ACCU-CHEK FASTCLIX LANCET) KIT 1 Units by Does not apply route 2 (two) times daily.  Marland Kitchen LANTUS SOLOSTAR 100 UNIT/ML Solostar Pen INJECT 75 UNITS [place] D. (Patient taking differently: 80 Units. INJECT 75 UNITS [place] D.)  . losartan (COZAAR) 50 MG tablet Take 50 mg by mouth daily.  . magnesium gluconate (MAGONATE) 500 MG tablet Take 500 mg by mouth daily.  . meloxicam (MOBIC) 7.5 MG tablet   . metFORMIN (GLUCOPHAGE) 1000 MG tablet TAKE 1 TABLET BY MOUTH  TWICE DAILY  . metoprolol succinate (TOPROL-XL) 25 MG 24 hr tablet Take 25 mg by mouth daily.  . montelukast (SINGULAIR) 10 MG tablet TAKE 1 TABLET(10 MG) BY MOUTH DAILY  . pantoprazole (PROTONIX) 40 MG tablet Take 40 mg by mouth daily.  . pregabalin (LYRICA) 100 MG capsule Take 1 capsule (100 mg total) by mouth 2 (two) times daily.  . primidone (MYSOLINE) 50 MG tablet TAKE 2 TABLETS(100 MG) BY MOUTH DAILY  . rosuvastatin (CRESTOR) 20 MG tablet Take 20 mg by mouth daily.  . Semaglutide, 1 MG/DOSE, (OZEMPIC, 1 MG/DOSE,) 2 MG/1.5ML SOPN Inject 1 mg into the skin once a week.  . sucralfate (CARAFATE) 1 g tablet TAKE 1 TABLET(1 GRAM) BY MOUTH THREE TIMES DAILY AS NEEDED  . tiZANidine (ZANAFLEX) 4 MG tablet Take 1 tablet (4 mg total) by  mouth 3 times/day as needed-between meals & bedtime for muscle spasms.   No current facility-administered medications on file prior to visit.     Allergies:  No Known Allergies  Social History:  Social History   Socioeconomic History  . Marital status: Divorced    Spouse name: Not on file  . Number of children: Not on file  . Years of education: Not on file  .  Highest education level: 8th grade  Occupational History  . Not on file  Social Needs  . Financial resource strain: Somewhat hard  . Food insecurity    Worry: Never true    Inability: Never true  . Transportation needs    Medical: No    Non-medical: No  Tobacco Use  . Smoking status: Former Smoker    Packs/day: 0.50    Types: Cigarettes  . Smokeless tobacco: Current User    Types: Chew  . Tobacco comment: quit 40 years ago   Substance and Sexual Activity  . Alcohol use: Not Currently  . Drug use: Never  . Sexual activity: Not on file  Lifestyle  . Physical activity    Days per week: 0 days    Minutes per session: 0 min  . Stress: Not at all  Relationships  . Social Herbalist on phone: Once a week    Gets together: More than three times a week    Attends religious service: Never    Active member of club or organization: No    Attends meetings of clubs or organizations: Never    Relationship status: Divorced  . Intimate partner violence    Fear of current or ex partner: No    Emotionally abused: No    Physically abused: No    Forced sexual activity: No  Other Topics Concern  . Not on file  Social History Narrative  . Not on file   Social History   Tobacco Use  Smoking Status Former Smoker  . Packs/day: 0.50  . Types: Cigarettes  Smokeless Tobacco Current User  . Types: Chew  Tobacco Comment   quit 40 years ago    Social History   Substance and Sexual Activity  Alcohol Use Not Currently    Family History:  Family History  Problem Relation Age of Onset  . Cancer Mother   .  Heart disease Mother   . Diabetes Father     Past medical history, surgical history, medications, allergies, family history and social history reviewed with patient today and changes made to appropriate areas of the chart.   Review of Systems - General ROS: negative Psychological ROS: negative Ophthalmic ROS: negative ENT ROS: negative Allergy and Immunology ROS: negative Hematological and Lymphatic ROS: negative Endocrine ROS: negative Breast ROS: negative for breast lumps Respiratory ROS: no cough, shortness of breath, or wheezing Cardiovascular ROS: no chest pain or dyspnea on exertion Gastrointestinal ROS: no abdominal pain, change in bowel habits, or black or bloody stools Genito-Urinary ROS: no dysuria, trouble voiding, or hematuria Musculoskeletal ROS: positive for - weakness Neurological ROS: no TIA or stroke symptoms Dermatological ROS: negative All other ROS negative except what is listed above and in the HPI.      Objective:    BP 105/77   Pulse 75   Temp 98.1 F (36.7 C) (Oral)   Ht 5' 6.5" (1.689 m)   Wt 219 lb (99.3 kg)   SpO2 95%   BMI 34.82 kg/m   Wt Readings from Last 3 Encounters:  12/24/18 219 lb (99.3 kg)  12/06/18 212 lb (96.2 kg)  11/07/18 215 lb 9.6 oz (97.8 kg)    Physical Exam  Results for orders placed or performed in visit on 12/24/18  CBC with Differential/Platelet out  Result Value Ref Range   WBC 6.4 3.4 - 10.8 x10E3/uL   RBC 4.95 4.14 - 5.80 x10E6/uL   Hemoglobin 15.3 13.0 - 17.7 g/dL  Hematocrit 46.6 37.5 - 51.0 %   MCV 94 79 - 97 fL   MCH 30.9 26.6 - 33.0 pg   MCHC 32.8 31.5 - 35.7 g/dL   RDW 13.1 11.6 - 15.4 %   Platelets 185 150 - 450 x10E3/uL   Neutrophils 68 Not Estab. %   Lymphs 19 Not Estab. %   Monocytes 8 Not Estab. %   Eos 3 Not Estab. %   Basos 1 Not Estab. %   Neutrophils Absolute 4.5 1.4 - 7.0 x10E3/uL   Lymphocytes Absolute 1.2 0.7 - 3.1 x10E3/uL   Monocytes Absolute 0.5 0.1 - 0.9 x10E3/uL   EOS (ABSOLUTE)  0.2 0.0 - 0.4 x10E3/uL   Basophils Absolute 0.0 0.0 - 0.2 x10E3/uL   Immature Granulocytes 1 Not Estab. %   Immature Grans (Abs) 0.0 0.0 - 0.1 x10E3/uL  Comprehensive metabolic panel  Result Value Ref Range   Glucose 133 (H) 65 - 99 mg/dL   BUN 14 8 - 27 mg/dL   Creatinine, Ser 1.02 0.76 - 1.27 mg/dL   GFR calc non Af Amer 79 >59 mL/min/1.73   GFR calc Af Amer 91 >59 mL/min/1.73   BUN/Creatinine Ratio 14 10 - 24   Sodium 140 134 - 144 mmol/L   Potassium 3.9 3.5 - 5.2 mmol/L   Chloride 99 96 - 106 mmol/L   CO2 26 20 - 29 mmol/L   Calcium 9.1 8.6 - 10.2 mg/dL   Total Protein 6.6 6.0 - 8.5 g/dL   Albumin 4.4 3.8 - 4.8 g/dL   Globulin, Total 2.2 1.5 - 4.5 g/dL   Albumin/Globulin Ratio 2.0 1.2 - 2.2   Bilirubin Total 0.4 0.0 - 1.2 mg/dL   Alkaline Phosphatase 136 (H) 39 - 117 IU/L   AST 30 0 - 40 IU/L   ALT 34 0 - 44 IU/L  Lipid Panel w/o Chol/HDL Ratio out  Result Value Ref Range   Cholesterol, Total 129 100 - 199 mg/dL   Triglycerides 328 (H) 0 - 149 mg/dL   HDL 38 (L) >39 mg/dL   VLDL Cholesterol Cal 49 (H) 5 - 40 mg/dL   LDL Chol Calc (NIH) 42 0 - 99 mg/dL  UA/M w/rflx Culture, Routine   Specimen: Urine   URINE  Result Value Ref Range   Specific Gravity, UA 1.015 1.005 - 1.030   pH, UA 6.0 5.0 - 7.5   Color, UA Yellow Yellow   Appearance Ur Clear Clear   Leukocytes,UA Negative Negative   Protein,UA Negative Negative/Trace   Glucose, UA 3+ (A) Negative   Ketones, UA Negative Negative   RBC, UA Negative Negative   Bilirubin, UA Negative Negative   Urobilinogen, Ur 0.2 0.2 - 1.0 mg/dL   Nitrite, UA Negative Negative  PSA  Result Value Ref Range   Prostate Specific Ag, Serum 0.3 0.0 - 4.0 ng/mL      Assessment & Plan:   Problem List Items Addressed This Visit      Cardiovascular and Mediastinum   Essential hypertension    BPs stable and WNL, continue current regimen. Script for home monitor given so he can follow along from home. Call with persistent abnormal  readings      Relevant Orders   CBC with Differential/Platelet out (Completed)   Comprehensive metabolic panel (Completed)   UA/M w/rflx Culture, Routine (Completed)     Respiratory   Asthma    Followed by Pulmonology. Appears stable without recent exacerbations. Continue current regimen  Digestive   GERD (gastroesophageal reflux disease)    Stable, continue current regimen        Endocrine   Type 2 diabetes mellitus with hyperglycemia ([place]) - Primary    Followed by Endocrinology, continue per their recommendations        Nervous and Auditory   Chronic bilateral low back pain with bilateral sciatica    Followed by Pain Management, continue per their recommendations        Other   Major depression    Stable and under good control, continue current regimen      Hyperlipidemia    Recheck lipids, adjust as needed. Continue current regimen      Relevant Orders   Lipid Panel w/o Chol/HDL Ratio out (Completed)    Other Visit Diagnoses    Annual physical exam       Screening for prostate cancer       Relevant Orders   PSA (Completed)   Weakness       Work on conditioning, will eval for motorized scooter and await insurance approval status       Discussed aspirin prophylaxis for myocardial infarction prevention and decision was made to continue ASA  LABORATORY TESTING:  Health maintenance labs ordered today as discussed above.   The natural history of prostate cancer and ongoing controversy regarding screening and potential treatment outcomes of prostate cancer has been discussed with the patient. The meaning of a false positive PSA and a false negative PSA has been discussed. He indicates understanding of the limitations of this screening test and wishes to proceed with screening PSA testing.   IMMUNIZATIONS:   - Tdap: Tetanus vaccination status reviewed: last tetanus booster within 10 years. - Influenza: Up to date - Pneumovax: Up to date - Prevnar:  Not applicable - HPV: Not applicable - Zostavax vaccine: postponed  SCREENING: - Colonoscopy: consultation scheduled  Discussed with patient purpose of the colonoscopy is to detect colon cancer at curable precancerous or early stages   PATIENT COUNSELING:    Sexuality: Discussed sexually transmitted diseases, partner selection, use of condoms, avoidance of unintended pregnancy  and contraceptive alternatives.   Advised to avoid cigarette smoking.  I discussed with the patient that most people either abstain from alcohol or drink within safe limits (<=14/week and <=4 drinks/occasion for males, <=7/weeks and <= 3 drinks/occasion for females) and that the risk for alcohol disorders and other health effects rises proportionally with the number of drinks per week and how often a drinker exceeds daily limits.  Discussed cessation/primary prevention of drug use and availability of treatment for abuse.   Diet: Encouraged to adjust caloric intake to maintain  or achieve ideal body weight, to reduce intake of dietary saturated fat and total fat, to limit sodium intake by avoiding high sodium foods and not adding table salt, and to maintain adequate dietary potassium and calcium preferably from fresh fruits, vegetables, and low-fat dairy products.    stressed the importance of regular exercise  Injury prevention: Discussed safety belts, safety helmets, smoke detector, smoking near bedding or upholstery.   Dental health: Discussed importance of regular tooth brushing, flossing, and dental visits.   Follow up plan: NEXT PREVENTATIVE PHYSICAL DUE IN 1 YEAR. Return in about 6 months (around 06/23/2019) for 6 month f/u.

## 2018-12-25 ENCOUNTER — Ambulatory Visit: Payer: Self-pay | Admitting: Licensed Clinical Social Worker

## 2018-12-25 ENCOUNTER — Telehealth: Payer: Self-pay

## 2018-12-25 DIAGNOSIS — R0602 Shortness of breath: Secondary | ICD-10-CM | POA: Diagnosis not present

## 2018-12-25 DIAGNOSIS — I208 Other forms of angina pectoris: Secondary | ICD-10-CM | POA: Diagnosis not present

## 2018-12-25 LAB — CBC WITH DIFFERENTIAL/PLATELET
Basophils Absolute: 0 10*3/uL (ref 0.0–0.2)
Basos: 1 %
EOS (ABSOLUTE): 0.2 10*3/uL (ref 0.0–0.4)
Eos: 3 %
Hematocrit: 46.6 % (ref 37.5–51.0)
Hemoglobin: 15.3 g/dL (ref 13.0–17.7)
Immature Grans (Abs): 0 10*3/uL (ref 0.0–0.1)
Immature Granulocytes: 1 %
Lymphocytes Absolute: 1.2 10*3/uL (ref 0.7–3.1)
Lymphs: 19 %
MCH: 30.9 pg (ref 26.6–33.0)
MCHC: 32.8 g/dL (ref 31.5–35.7)
MCV: 94 fL (ref 79–97)
Monocytes Absolute: 0.5 10*3/uL (ref 0.1–0.9)
Monocytes: 8 %
Neutrophils Absolute: 4.5 10*3/uL (ref 1.4–7.0)
Neutrophils: 68 %
Platelets: 185 10*3/uL (ref 150–450)
RBC: 4.95 x10E6/uL (ref 4.14–5.80)
RDW: 13.1 % (ref 11.6–15.4)
WBC: 6.4 10*3/uL (ref 3.4–10.8)

## 2018-12-25 LAB — COMPREHENSIVE METABOLIC PANEL
ALT: 34 IU/L (ref 0–44)
AST: 30 IU/L (ref 0–40)
Albumin/Globulin Ratio: 2 (ref 1.2–2.2)
Albumin: 4.4 g/dL (ref 3.8–4.8)
Alkaline Phosphatase: 136 IU/L — ABNORMAL HIGH (ref 39–117)
BUN/Creatinine Ratio: 14 (ref 10–24)
BUN: 14 mg/dL (ref 8–27)
Bilirubin Total: 0.4 mg/dL (ref 0.0–1.2)
CO2: 26 mmol/L (ref 20–29)
Calcium: 9.1 mg/dL (ref 8.6–10.2)
Chloride: 99 mmol/L (ref 96–106)
Creatinine, Ser: 1.02 mg/dL (ref 0.76–1.27)
GFR calc Af Amer: 91 mL/min/{1.73_m2} (ref >59)
GFR calc non Af Amer: 79 mL/min/{1.73_m2} (ref >59)
Globulin, Total: 2.2 g/dL (ref 1.5–4.5)
Glucose: 133 mg/dL — ABNORMAL HIGH (ref 65–99)
Potassium: 3.9 mmol/L (ref 3.5–5.2)
Sodium: 140 mmol/L (ref 134–144)
Total Protein: 6.6 g/dL (ref 6.0–8.5)

## 2018-12-25 LAB — LIPID PANEL W/O CHOL/HDL RATIO
Cholesterol, Total: 129 mg/dL (ref 100–199)
HDL: 38 mg/dL — ABNORMAL LOW (ref >39)
LDL Chol Calc (NIH): 42 mg/dL (ref 0–99)
Triglycerides: 328 mg/dL — ABNORMAL HIGH (ref 0–149)
VLDL Cholesterol Cal: 49 mg/dL — ABNORMAL HIGH (ref 5–40)

## 2018-12-25 LAB — PSA: Prostate Specific Ag, Serum: 0.3 ng/mL (ref 0.0–4.0)

## 2018-12-25 NOTE — Chronic Care Management (AMB) (Signed)
  Care Management   Follow Up Note   12/25/2018 Name: [name] MRN: [id number] DOB: [birth year]  Referred by: Volney American, PA-C Reason for referral : Care Coordination   Corey [name] is a 63 y.o. year old male who is a primary care patient of Volney American, [place]. The care management team was consulted for assistance with care management and care coordination needs.    Review of patient status, including review of consultants reports, relevant laboratory and other test results, and collaboration with appropriate care team members and the patient's provider was performed as part of comprehensive patient evaluation and provision of chronic care management services.    LCSW completed CCM outreach attempt today but was unable to reach patient successfully. A HIPPA compliant voice message was unable to be left as patient's number was currently out of service and not taking calls. LCSW rescheduled CCM SW appointment instead.  Eula Fried, BSW, MSW, [facility] Care Management [email or web address] Phone: [phone]

## 2018-12-26 ENCOUNTER — Ambulatory Visit: Payer: Medicare Other | Admitting: Gastroenterology

## 2018-12-31 NOTE — Assessment & Plan Note (Signed)
Followed by Pain Management, continue per their recommendations

## 2018-12-31 NOTE — Assessment & Plan Note (Signed)
Followed by Pulmonology. Appears stable without recent exacerbations. Continue current regimen

## 2018-12-31 NOTE — Assessment & Plan Note (Signed)
Followed by Endocrinology, continue per their recommendations.  

## 2018-12-31 NOTE — Assessment & Plan Note (Signed)
Recheck lipids, adjust as needed. Continue current regimen 

## 2018-12-31 NOTE — Assessment & Plan Note (Signed)
BPs stable and WNL, continue current regimen. Script for home monitor given so he can follow along from home. Call with persistent abnormal readings

## 2018-12-31 NOTE — Assessment & Plan Note (Signed)
Stable, continue current regimen 

## 2018-12-31 NOTE — Assessment & Plan Note (Signed)
Stable and under good control, continue current regimen 

## 2019-01-01 ENCOUNTER — Ambulatory Visit: Payer: Self-pay | Admitting: Pharmacist

## 2019-01-01 DIAGNOSIS — I208 Other forms of angina pectoris: Secondary | ICD-10-CM | POA: Diagnosis not present

## 2019-01-01 DIAGNOSIS — I25118 Atherosclerotic heart disease of native coronary artery with other forms of angina pectoris: Secondary | ICD-10-CM | POA: Diagnosis not present

## 2019-01-01 DIAGNOSIS — E1165 Type 2 diabetes mellitus with hyperglycemia: Secondary | ICD-10-CM

## 2019-01-01 DIAGNOSIS — R0602 Shortness of breath: Secondary | ICD-10-CM | POA: Diagnosis not present

## 2019-01-01 DIAGNOSIS — I10 Essential (primary) hypertension: Secondary | ICD-10-CM

## 2019-01-01 DIAGNOSIS — I200 Unstable angina: Secondary | ICD-10-CM | POA: Diagnosis not present

## 2019-01-01 DIAGNOSIS — I2510 Atherosclerotic heart disease of native coronary artery without angina pectoris: Secondary | ICD-10-CM | POA: Diagnosis not present

## 2019-01-01 DIAGNOSIS — Z794 Long term (current) use of insulin: Secondary | ICD-10-CM

## 2019-01-01 NOTE — Patient Instructions (Addendum)
Here is how I would fill your pill box:   Morning:  - Bupropion - Cetirizine - Vitamin D - Citalopram - Clopidogrel - Aspirin - Furosemide - Glyxambi - Metformin - Metoprolol - Pantoprazole - Pregabalin  Evening/Bedtime - Amitriptyline - Metformin - Losartan - Montelukast - Pregabalin - Primidone - Rosuvastatin  The sucralfate needs to be taken on an empty stomach, at least 1 hour before a meal. You need to wait at least 2 hours after taking the sucralfate to take the other medications.   Please call with any questions!  Visit Information  Goals Addressed            This Visit's Progress     Patient Stated   . PharmD "I want to take care of my sugars" (pt-stated)       Current Barriers:  . Diabetes: uncontrolled; most recent A1c 8.6%; follows w/ [name] o Requested a BID pill box and instructions be mailed to him. Notes he did not receive this in the mail that was mailed 11/17 . Current antihyperglycemic regimen: metformin 1000 mg BID, Ozempic 1 mg weekly, Glyxambi 25/5 mg daily, Lantus 85 units daily . Current meal patterns: . Current exercise: none, limited by chronic lower back pain. Patient wonders about getting a scooter like his wife has to help w/ mobility. . Current blood glucose readings:  o Fasting: 90s-200; notes that highly dependent on what he eats . Cardiovascular risk reduction: o Current hypertensive regimen: losartan 50 mg daily, metoprolol succinate 25 mg daily o Current hyperlipidemia regimen: rosuvastatin 20 mg daily  Pharmacist Clinical Goal(s):  Marland Kitchen Over the next 90 days, patient with work with PharmD and primary care provider to address optimized glycemic management  Interventions: . Re-sending BID pill box + instructions on how to fill pill box.  . Will collaborate w/ RN CM and LCSW for support. Confirmed patient's desire to contact his home phone.   Patient Self Care Activities:  . Patient will check blood  glucose BID, document, and provide at future appointments . Patient will take medications as prescribed . Patient will report any questions or concerns to provider   Please see past updates related to this goal by clicking on the "Past Updates" button in the selected goal         Print copy of patient instructions provided.   Plan: - Will mail pill box as above - Will outreach patient for continued support as scheduled  Catie Darnelle Maffucci, PharmD, [place] [phone]

## 2019-01-01 NOTE — Chronic Care Management (AMB) (Signed)
Chronic Care Management   Follow Up Note   01/01/2019 Name: Corey [name] [name] MRN: [id number] DOB: [birth year]  Referred by: Corey [name] American, PA-C Reason for referral : Chronic Care Management (Medication Management)   Corey [name] [name] is a 63 y.o. year old male who is a primary care patient of Corey [name] [name], [place]. The CCM team was consulted for assistance with chronic disease management and care coordination needs.   Received call from patient today with med management questions.    Review of patient status, including review of consultants reports, relevant laboratory and other test results, and collaboration with appropriate care team members and the patient's provider was performed as part of comprehensive patient evaluation and provision of chronic care management services.    SDOH (Social Determinants of Health) screening performed today: Transportation. See Care Plan for related entries.   Outpatient Encounter Medications as of 01/01/2019  Medication Sig Note  . Accu-Chek FastClix Lancets MISC USE TO CHECK BLOOD SUGAR   . ACCU-CHEK GUIDE test strip USE TO CHECK BLOOD SUGAR TID   . acetaminophen (TYLENOL) 500 MG tablet Take 1,000 mg by mouth every 6 (six) hours as needed for moderate pain or headache.   . albuterol (PROVENTIL) (2.5 MG/3ML) 0.083% nebulizer solution Take 3 mLs (2.5 mg total) by nebulization every 6 (six) hours as needed for wheezing or shortness of breath. 12/07/2018: Using more frequently recently   . albuterol (VENTOLIN HFA) 108 (90 Base) MCG/ACT inhaler INHALE 2 PUFFS INTO THE LUNGS EVERY 6 HOURS AS NEEDED FOR WHEEZING OR SHORTNESS OF BREATH   . amitriptyline (ELAVIL) 50 MG tablet Take 1-2 tablets (50-100 mg total) by mouth at bedtime.   Marland Kitchen aspirin EC 81 MG tablet Take 81 mg by mouth daily.   Marland Kitchen azelastine (ASTELIN) 0.1 % nasal spray Place 2 sprays into both nostrils 2 (two) times daily. Use in each nostril as directed   . B-D UF III MINI PEN NEEDLES 31G  X 5 MM MISC USE TWICE DAILY   . Blood Glucose Monitoring Suppl (ACCU-CHEK GUIDE) w/Device KIT U UTD   . buPROPion (WELLBUTRIN XL) 150 MG 24 hr tablet TAKE 1 TABLET(150 MG) BY MOUTH DAILY   . cetirizine (ZYRTEC) 10 MG tablet Take 1 tablet (10 mg total) by mouth daily.   . Cholecalciferol (VITAMIN D3) 5000 units TABS Take 5,000 Units by mouth daily.    . citalopram (CELEXA) 10 MG tablet TAKE 1 TABLET BY MOUTH  DAILY   . clopidogrel (PLAVIX) 75 MG tablet Take by mouth.   . diclofenac sodium (VOLTAREN) 1 % GEL Apply 1 application topically 4 (four) times daily as needed (pain).    . Fluticasone-Salmeterol (ADVAIR) 500-50 MCG/DOSE AEPB Inhale 1 puff into the lungs 2 (two) times daily.   . furosemide (LASIX) 20 MG tablet TAKE 1 TABLET(20 MG) BY MOUTH TWICE DAILY   . GLYXAMBI 25-5 MG TABS TAKE 1 TABLET BY MOUTH IN  THE MORNING   . Insulin Syringe-Needle U-100 30G X 1/2" 1 ML MISC 1 Units by Does not apply route daily.   . Lancets Misc. (ACCU-CHEK FASTCLIX LANCET) KIT 1 Units by Does not apply route 2 (two) times daily.   Marland Kitchen LANTUS SOLOSTAR 100 UNIT/ML Solostar Pen INJECT 75 UNITS Corey [name] D. (Patient taking differently: 80 Units. INJECT 75 UNITS Corey [name] D.)   . losartan (COZAAR) 50 MG tablet Take 50 mg by mouth daily.   . magnesium gluconate (MAGONATE) 500 MG tablet Take 500 mg by mouth daily.   Marland Kitchen  meloxicam (MOBIC) 7.5 MG tablet    . metFORMIN (GLUCOPHAGE) 1000 MG tablet TAKE 1 TABLET BY MOUTH  TWICE DAILY   . metoprolol succinate (TOPROL-XL) 25 MG 24 hr tablet Take 25 mg by mouth daily.   . montelukast (SINGULAIR) 10 MG tablet TAKE 1 TABLET(10 MG) BY MOUTH DAILY   . pantoprazole (PROTONIX) 40 MG tablet Take 40 mg by mouth daily.   . pregabalin (LYRICA) 100 MG capsule Take 1 capsule (100 mg total) by mouth 2 (two) times daily.   . primidone (MYSOLINE) 50 MG tablet TAKE 2 TABLETS(100 MG) BY MOUTH DAILY   . rosuvastatin (CRESTOR) 20 MG tablet Take 20 mg by mouth daily.   . Semaglutide, 1 MG/DOSE, (OZEMPIC, 1  MG/DOSE,) 2 MG/1.5ML SOPN Inject 1 mg into the skin once a week.   . sucralfate (CARAFATE) 1 g tablet TAKE 1 TABLET(1 GRAM) BY MOUTH THREE TIMES DAILY AS NEEDED 12/07/2018: BID   . tiZANidine (ZANAFLEX) 4 MG tablet Take 1 tablet (4 mg total) by mouth 3 times/day as needed-between meals & bedtime for muscle spasms. 12/07/2018: Using once daily   No facility-administered encounter medications on file as of 01/01/2019.      Goals Addressed            This Visit's Progress     Patient Stated   . PharmD "I want to take care of my sugars" (pt-stated)       Current Barriers:  . Diabetes: uncontrolled; most recent A1c 8.6%; follows w/ [facility] Corey [name] [name]/Corey [name] [name] o Requested a BID pill box and instructions be mailed to him. Notes he did not receive this in the mail that was mailed 11/17 . Current antihyperglycemic regimen: metformin 1000 mg BID, Ozempic 1 mg weekly, Glyxambi 25/5 mg daily, Lantus 85 units daily . Current meal patterns: . Current exercise: none, limited by chronic lower back pain . Current blood glucose readings:  o Fasting: 90s-200; notes that highly dependent on what he eats . Cardiovascular risk reduction: o Current hypertensive regimen: losartan 50 mg daily, metoprolol succinate 25 mg daily o Current hyperlipidemia regimen: rosuvastatin 20 mg daily  Pharmacist Clinical Goal(s):  Marland Kitchen Over the next 90 days, patient with work with PharmD and primary care provider to address optimized glycemic management  Interventions: . Re-sending BID pill box + instructions on how to fill pill box.  . Will collaborate w/ RN CM and LCSW for support. Confirmed patient's desire to contact his home phone.   Patient Self Care Activities:  . Patient will check blood glucose BID, document, and provide at future appointments . Patient will take medications as prescribed . Patient will report any questions or concerns to provider   Please see past updates related to this goal by clicking on the  "Past Updates" button in the selected goal          Plan: - Will mail pill box as above - Will outreach patient for continued support as scheduled  Catie Darnelle Maffucci, PharmD, [place] [phone]

## 2019-01-02 ENCOUNTER — Other Ambulatory Visit: Payer: Self-pay

## 2019-01-02 MED ORDER — SUCRALFATE 1 G PO TABS: ORAL_TABLET | ORAL | 0 refills | Status: DC

## 2019-01-06 ENCOUNTER — Other Ambulatory Visit: Payer: Self-pay | Admitting: Student in an Organized Health Care Education/Training Program

## 2019-01-06 DIAGNOSIS — G894 Chronic pain syndrome: Secondary | ICD-10-CM

## 2019-01-11 DIAGNOSIS — J45909 Unspecified asthma, uncomplicated: Secondary | ICD-10-CM | POA: Diagnosis not present

## 2019-01-13 ENCOUNTER — Other Ambulatory Visit: Payer: Self-pay | Admitting: Student in an Organized Health Care Education/Training Program

## 2019-01-13 DIAGNOSIS — G894 Chronic pain syndrome: Secondary | ICD-10-CM

## 2019-01-15 ENCOUNTER — Ambulatory Visit: Payer: Medicare Other | Admitting: Pharmacist

## 2019-01-15 ENCOUNTER — Other Ambulatory Visit: Payer: Self-pay

## 2019-01-15 DIAGNOSIS — E1165 Type 2 diabetes mellitus with hyperglycemia: Secondary | ICD-10-CM

## 2019-01-15 NOTE — Chronic Care Management (AMB) (Signed)
  Chronic Care Management   Note  01/15/2019 Name: Corey [name] MRN: [id number] DOB: 1955/07/10  Corey [name] is a 64 y.o. year old male who is a primary care patient of Volney American, [place]. The CCM team was consulted for assistance with chronic disease management and care coordination needs.    Attempted to contact patient to follow up on medication management needs, however, was unable to leave a voicemail on any listed numbers.   Will mail patient his AVS with CCM team contact information. Will attempt another outreach in ~4-5 weeks if I do not hear back from him  Catie Darnelle Maffucci, PharmD, [place] [phone]

## 2019-01-15 NOTE — Patient Instructions (Addendum)
Montrice,   I tried giving you a call, but was unable to leave a message on any of the numbers we have listed for you-   Home- [phone]  Cell- [phone]  I also tried your emergency contact, Crystal, but was unable to leave a message there either [phone])  I wanted to touch base about your medications and filling your pill box. My nursing colleague, Merlene Morse, is planning on calling you on Friday about your scooter.    Please call me with your updated contact information when you get this! Also, please let me know if you are having difficulties paying your phone bill, I may be able to put you in contact with someone who could help with this.   Thanks!  CCM (Chronic Care Management) Team   Catie Darnelle Maffucci PharmD, [place] Clinical Pharmacist  [phone]  Merlene Morse Minor RN, BSN Nurse Care Coordinator  [phone]  [place] Clinical Social Worker [phone]

## 2019-01-15 NOTE — Telephone Encounter (Signed)
Patient last seen 12/24/18 

## 2019-01-16 ENCOUNTER — Telehealth: Payer: Self-pay | Admitting: *Deleted

## 2019-01-16 MED ORDER — BUPROPION HCL ER (XL) 150 MG PO TB24: ORAL_TABLET | ORAL | 1 refills | Status: DC

## 2019-01-16 NOTE — Telephone Encounter (Signed)
Attempted to call patient, the number listed is not in service.

## 2019-01-17 DIAGNOSIS — R0600 Dyspnea, unspecified: Secondary | ICD-10-CM | POA: Diagnosis not present

## 2019-01-17 DIAGNOSIS — J449 Chronic obstructive pulmonary disease, unspecified: Secondary | ICD-10-CM | POA: Diagnosis not present

## 2019-01-18 ENCOUNTER — Telehealth: Payer: Self-pay | Admitting: Family Medicine

## 2019-01-18 ENCOUNTER — Ambulatory Visit: Payer: Self-pay | Admitting: Pharmacist

## 2019-01-18 ENCOUNTER — Ambulatory Visit (INDEPENDENT_AMBULATORY_CARE_PROVIDER_SITE_OTHER): Payer: Medicare Other | Admitting: *Deleted

## 2019-01-18 DIAGNOSIS — R531 Weakness: Secondary | ICD-10-CM

## 2019-01-18 DIAGNOSIS — Z794 Long term (current) use of insulin: Secondary | ICD-10-CM | POA: Diagnosis not present

## 2019-01-18 DIAGNOSIS — J4540 Moderate persistent asthma, uncomplicated: Secondary | ICD-10-CM

## 2019-01-18 DIAGNOSIS — E1165 Type 2 diabetes mellitus with hyperglycemia: Secondary | ICD-10-CM

## 2019-01-18 DIAGNOSIS — G2581 Restless legs syndrome: Secondary | ICD-10-CM

## 2019-01-18 DIAGNOSIS — I10 Essential (primary) hypertension: Secondary | ICD-10-CM

## 2019-01-18 NOTE — Telephone Encounter (Signed)
Pt called in regarding a letter he received from Catie.

## 2019-01-18 NOTE — Chronic Care Management (AMB) (Signed)
Chronic Care Management   Follow Up Note   01/18/2019 Name: ABHAY GODBOLT MRN: [id number] DOB: [birth year]  Referred by: Volney American, PA-C Reason for referral : Chronic Care Management (DM, Need for a scooter ) and Care Coordination (DME)   DURWIN DAVISSON is a 63 y.o. year old male who is a primary care patient of Volney American, [place]. The CCM team was consulted for assistance with chronic disease management and care coordination needs.    Review of patient status, including review of consultants reports, relevant laboratory and other test results, and collaboration with appropriate care team members and the patient's provider was performed as part of comprehensive patient evaluation and provision of chronic care management services.    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 See Care Plan for related entries.   Outpatient Encounter Medications as of 01/18/2019  Medication Sig Note  . Accu-Chek FastClix Lancets MISC USE TO CHECK BLOOD SUGAR   . ACCU-CHEK GUIDE test strip USE TO CHECK BLOOD SUGAR TID   . acetaminophen (TYLENOL) 500 MG tablet Take 1,000 mg by mouth every 6 (six) hours as needed for moderate pain or headache.   . albuterol (PROVENTIL) (2.5 MG/3ML) 0.083% nebulizer solution Take 3 mLs (2.5 mg total) by nebulization every 6 (six) hours as needed for wheezing or shortness of breath. 12/07/2018: Using more frequently recently   . albuterol (VENTOLIN HFA) 108 (90 Base) MCG/ACT inhaler INHALE 2 PUFFS INTO THE LUNGS EVERY 6 HOURS AS NEEDED FOR WHEEZING OR SHORTNESS OF BREATH   . amitriptyline (ELAVIL) 50 MG tablet Take 1-2 tablets (50-100 mg total) by mouth at bedtime.   Marland Kitchen aspirin EC 81 MG tablet Take 81 mg by mouth daily.   Marland Kitchen azelastine (ASTELIN) 0.1 % nasal spray Place 2 sprays into both nostrils 2 (two) times daily. Use in each nostril as directed   . B-D UF III MINI PEN  NEEDLES 31G X 5 MM MISC USE TWICE DAILY   . Blood Glucose Monitoring Suppl (ACCU-CHEK GUIDE) w/Device KIT U UTD   . buPROPion (WELLBUTRIN XL) 150 MG 24 hr tablet TAKE 1 TABLET(150 MG) BY MOUTH DAILY   . cetirizine (ZYRTEC) 10 MG tablet Take 1 tablet (10 mg total) by mouth daily.   . Cholecalciferol (VITAMIN D3) 5000 units TABS Take 5,000 Units by mouth daily.    . citalopram (CELEXA) 10 MG tablet TAKE 1 TABLET BY MOUTH  DAILY   . clopidogrel (PLAVIX) 75 MG tablet Take by mouth.   . diclofenac sodium (VOLTAREN) 1 % GEL Apply 1 application topically 4 (four) times daily as needed (pain).    . Fluticasone-Salmeterol (ADVAIR) 500-50 MCG/DOSE AEPB Inhale 1 puff into the lungs 2 (two) times daily.   . furosemide (LASIX) 20 MG tablet TAKE 1 TABLET(20 MG) BY MOUTH TWICE DAILY   . GLYXAMBI 25-5 MG TABS TAKE 1 TABLET BY MOUTH IN  THE MORNING   . Insulin Syringe-Needle U-100 30G X 1/2" 1 ML MISC 1 Units by Does not apply route daily.   . Lancets Misc. (ACCU-CHEK FASTCLIX LANCET) KIT 1 Units by Does not apply route 2 (two) times daily.   Marland Kitchen LANTUS SOLOSTAR 100 UNIT/ML Solostar Pen INJECT 75 UNITS [place] D. (Patient taking differently: 80 Units. INJECT 75 UNITS [place] D.) 01/18/2019: 85 units   . losartan (COZAAR) 50 MG tablet Take 50 mg by mouth daily.   . magnesium gluconate (MAGONATE) 500 MG tablet Take 500  mg by mouth daily.   . meloxicam (MOBIC) 7.5 MG tablet    . metFORMIN (GLUCOPHAGE) 1000 MG tablet TAKE 1 TABLET BY MOUTH  TWICE DAILY   . metoprolol succinate (TOPROL-XL) 25 MG 24 hr tablet Take 25 mg by mouth daily.   . montelukast (SINGULAIR) 10 MG tablet TAKE 1 TABLET(10 MG) BY MOUTH DAILY   . pantoprazole (PROTONIX) 40 MG tablet Take 40 mg by mouth daily.   . pregabalin (LYRICA) 100 MG capsule Take 1 capsule (100 mg total) by mouth 2 (two) times daily.   . primidone (MYSOLINE) 50 MG tablet TAKE 2 TABLETS(100 MG) BY MOUTH DAILY   . rosuvastatin (CRESTOR) 20 MG tablet Take 20 mg by mouth daily.   .  Semaglutide, 1 MG/DOSE, (OZEMPIC, 1 MG/DOSE,) 2 MG/1.5ML SOPN Inject 1 mg into the skin once a week.   . sucralfate (CARAFATE) 1 g tablet TAKE 1 TABLET(1 GRAM) BY MOUTH THREE TIMES DAILY AS NEEDED    No facility-administered encounter medications on file as of 01/18/2019.     Goals Addressed            This Visit's Progress   . RN-I have several health problems (pt-stated)       Current Barriers:  . Film/video editor.  . Chronic Disease Management support and education needs related to asthma, HTN, Angina, DMII and Chronic back pain related to degenerative disc and bilateral sciatica  Nurse Case Manager Clinical Goal(s):  Marland Kitchen Over the next 90 days, patient will work with [facility]  to address needs related to Managing multiple chronic disease processes   Interventions:  . Evaluation of current treatment plan related to chronic back pain and patient's adherence to plan as established by provider. . Provided education to patient re: obtaining a scooter for patient's expressed need related to inability to walk longer distances which prohibits his ability to get groceries and complete his IADLs  . Collaborated with PCP, front office staff and DME company  regarding patient's need for POV/Scooter . Discussed plans with patient for ongoing care management follow up and provided patient with direct contact information for care management team . Patient stated he would like to use the same company his spouse did, looked up this information- [place] [phone] opt [phone] ext [phone] . Placed a call to [place] they are faxing needed paperwork to PCP office . Patient stated when he walks long distance he gets Chest pain and SOB, he stated he recently went to see his cardiologist and he increased his medication which helped the chest pain.  . Patient stated he also recently saw his pulmonologist and will be scheduled for PFTs and chest xray at next visit.   Patient Self  Care Activities:  . Patient verbalizes understanding of plan to obtain POV . Unable to perform IADLs independently  Initial goal documentation         The care management team will reach out to the patient again over the next 30 days.  The patient has been provided with contact information for the care management team and has been advised to call with any health related questions or concerns.    Merlene Morse Loucinda Croy RN, BSN Nurse Case Editor, [facility] Care Management  [phone]) Business Mobile

## 2019-01-18 NOTE — Chronic Care Management (AMB) (Signed)
Chronic Care Management   Follow Up Note   01/18/2019 Name: RAYMONE PEMBROKE MRN: [id number] DOB: February 07, 1955  Referred by: Volney American, PA-C Reason for referral : Chronic Care Management (Medication Management)   BEJAMIN HACKBART is a 63 y.o. year old male who is a primary care patient of Volney American, [place]. The CCM team was consulted for assistance with chronic disease management and care coordination needs.    Received call back from patient.   Review of patient status, including review of consultants reports, relevant laboratory and other test results, and collaboration with appropriate care team members and the patient's provider was performed as part of comprehensive patient evaluation and provision of chronic care management services.    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 See Care Plan for related entries.   Outpatient Encounter Medications as of 01/18/2019  Medication Sig Note  . GLYXAMBI 25-5 MG TABS TAKE 1 TABLET BY MOUTH IN  THE MORNING   . LANTUS SOLOSTAR 100 UNIT/ML Solostar Pen INJECT 75 UNITS [place] D. (Patient taking differently: 80 Units. INJECT 75 UNITS [place] D.) 01/18/2019: 85 units   . metFORMIN (GLUCOPHAGE) 1000 MG tablet TAKE 1 TABLET BY MOUTH  TWICE DAILY   . Semaglutide, 1 MG/DOSE, (OZEMPIC, 1 MG/DOSE,) 2 MG/1.5ML SOPN Inject 1 mg into the skin once a week.   . Accu-Chek FastClix Lancets MISC USE TO CHECK BLOOD SUGAR   . ACCU-CHEK GUIDE test strip USE TO CHECK BLOOD SUGAR TID   . acetaminophen (TYLENOL) 500 MG tablet Take 1,000 mg by mouth every 6 (six) hours as needed for moderate pain or headache.   . albuterol (PROVENTIL) (2.5 MG/3ML) 0.083% nebulizer solution Take 3 mLs (2.5 mg total) by nebulization every 6 (six) hours as needed for wheezing or shortness of breath. 12/07/2018: Using more frequently recently   . albuterol (VENTOLIN HFA) 108 (90 Base) MCG/ACT inhaler INHALE 2 PUFFS INTO  THE LUNGS EVERY 6 HOURS AS NEEDED FOR WHEEZING OR SHORTNESS OF BREATH   . amitriptyline (ELAVIL) 50 MG tablet Take 1-2 tablets (50-100 mg total) by mouth at bedtime.   Marland Kitchen aspirin EC 81 MG tablet Take 81 mg by mouth daily.   Marland Kitchen azelastine (ASTELIN) 0.1 % nasal spray Place 2 sprays into both nostrils 2 (two) times daily. Use in each nostril as directed   . B-D UF III MINI PEN NEEDLES 31G X 5 MM MISC USE TWICE DAILY   . Blood Glucose Monitoring Suppl (ACCU-CHEK GUIDE) w/Device KIT U UTD   . buPROPion (WELLBUTRIN XL) 150 MG 24 hr tablet TAKE 1 TABLET(150 MG) BY MOUTH DAILY   . cetirizine (ZYRTEC) 10 MG tablet Take 1 tablet (10 mg total) by mouth daily.   . Cholecalciferol (VITAMIN D3) 5000 units TABS Take 5,000 Units by mouth daily.    . citalopram (CELEXA) 10 MG tablet TAKE 1 TABLET BY MOUTH  DAILY   . clopidogrel (PLAVIX) 75 MG tablet Take by mouth.   . diclofenac sodium (VOLTAREN) 1 % GEL Apply 1 application topically 4 (four) times daily as needed (pain).    . Fluticasone-Salmeterol (ADVAIR) 500-50 MCG/DOSE AEPB Inhale 1 puff into the lungs 2 (two) times daily.   . furosemide (LASIX) 20 MG tablet TAKE 1 TABLET(20 MG) BY MOUTH TWICE DAILY   . Insulin Syringe-Needle U-100 30G X 1/2" 1 ML MISC 1 Units by Does not apply route daily.   . Lancets Misc. (ACCU-CHEK FASTCLIX LANCET) KIT 1 Units by  Does not apply route 2 (two) times daily.   Marland Kitchen losartan (COZAAR) 50 MG tablet Take 50 mg by mouth daily.   . magnesium gluconate (MAGONATE) 500 MG tablet Take 500 mg by mouth daily.   . meloxicam (MOBIC) 7.5 MG tablet    . metoprolol succinate (TOPROL-XL) 25 MG 24 hr tablet Take 25 mg by mouth daily.   . montelukast (SINGULAIR) 10 MG tablet TAKE 1 TABLET(10 MG) BY MOUTH DAILY   . pantoprazole (PROTONIX) 40 MG tablet Take 40 mg by mouth daily.   . pregabalin (LYRICA) 100 MG capsule Take 1 capsule (100 mg total) by mouth 2 (two) times daily.   . primidone (MYSOLINE) 50 MG tablet TAKE 2 TABLETS(100 MG) BY MOUTH  DAILY   . rosuvastatin (CRESTOR) 20 MG tablet Take 20 mg by mouth daily.   . sucralfate (CARAFATE) 1 g tablet TAKE 1 TABLET(1 GRAM) BY MOUTH THREE TIMES DAILY AS NEEDED    No facility-administered encounter medications on file as of 01/18/2019.     Goals Addressed            This Visit's Progress     Patient Stated   . PharmD "I want to take care of my sugars" (pt-stated)       Current Barriers:  . Diabetes: uncontrolled; most recent A1c 8.6%; follows w/ [name] o Requested a BID pill box and instructions be mailed to him. Notes he did not receive this in the mail that was mailed 11/17, and then another one mailed 01/01/2019 . Current antihyperglycemic regimen: metformin 1000 mg BID, Ozempic 1 mg weekly, Glyxambi 25/5 mg daily, Lantus 85 units daily . Current exercise: none, limited by chronic lower back pain. Patient wonders about getting a scooter like his wife has to help w/ mobility. Call scheduled today w/ RN CM to discuss DME . Current blood glucose readings:  o Fasting: 100s this morning; does note a "low" reading of 84;  o A few hours after hour after meals, generally <200; highest in the 400s after a macaroni and tomato salad  . Cardiovascular risk reduction: o Current hypertensive regimen: losartan 50 mg daily, metoprolol succinate 25 mg daily o Current hyperlipidemia regimen: rosuvastatin 20 mg daily  Pharmacist Clinical Goal(s):  Marland Kitchen Over the next 90 days, patient with work with PharmD and primary care provider to address optimized glycemic management  Interventions: . Encouraged patient to continue to look out for mail from the office w/ a pill box. If he still has not received by our scheduled phone call in January, will plan to set aside a time for him to come by the office to pick one up.  . Warm handoff to RN CM regarding patient situation and DME needs . Encouraged continued BG checks prior to f/u with endocrinology.  . Reviewed upcoming  cardiology appt.   Patient Self Care Activities:  . Patient will check blood glucose BID, document, and provide at future appointments . Patient will take medications as prescribed . Patient will report any questions or concerns to provider   Please see past updates related to this goal by clicking on the "Past Updates" button in the selected goal          Plan: - Scheduled outreach in ~4 weeks (02/20/19 at 11:30) for continued medication management support   Catie Darnelle Maffucci, PharmD, [place] [phone]

## 2019-01-18 NOTE — Patient Instructions (Signed)
Visit Information  Goals Addressed            This Visit's Progress     Patient Stated   . PharmD "I want to take care of my sugars" (pt-stated)       Current Barriers:  . Diabetes: uncontrolled; most recent A1c 8.6%; follows w/ [name] o Requested a BID pill box and instructions be mailed to him. Notes he did not receive this in the mail that was mailed 11/17, and then another one mailed 01/01/2019 . Current antihyperglycemic regimen: metformin 1000 mg BID, Ozempic 1 mg weekly, Glyxambi 25/5 mg daily, Lantus 85 units daily . Current exercise: none, limited by chronic lower back pain. Patient wonders about getting a scooter like his wife has to help w/ mobility. Call scheduled today w/ RN CM to discuss DME . Current blood glucose readings:  o Fasting: 100s this morning; does note a "low" reading of 84;  o A few hours after hour after meals, generally <200; highest in the 400s after a macaroni and tomato salad  . Cardiovascular risk reduction: o Current hypertensive regimen: losartan 50 mg daily, metoprolol succinate 25 mg daily o Current hyperlipidemia regimen: rosuvastatin 20 mg daily  Pharmacist Clinical Goal(s):  Marland Kitchen Over the next 90 days, patient with work with PharmD and primary care provider to address optimized glycemic management  Interventions: . Encouraged patient to continue to look out for mail from the office w/ a pill box. If he still has not received by our scheduled phone call in January, will plan to set aside a time for him to come by the office to pick one up.  . Warm handoff to RN CM regarding patient situation and DME needs . Encouraged continued BG checks prior to f/u with endocrinology.  . Reviewed upcoming cardiology appt.   Patient Self Care Activities:  . Patient will check blood glucose BID, document, and provide at future appointments . Patient will take medications as prescribed . Patient will report any questions or concerns to  provider   Please see past updates related to this goal by clicking on the "Past Updates" button in the selected goal         The patient verbalized understanding of instructions provided today and declined a print copy of patient instruction materials.   Plan: - Scheduled outreach in ~4 weeks (02/20/19 at 11:30) for continued medication management support   Catie Darnelle Maffucci, PharmD, [place] [phone]

## 2019-01-18 NOTE — Telephone Encounter (Signed)
See CCM note 

## 2019-01-18 NOTE — Patient Instructions (Signed)
Thank you allowing the Chronic Care Management Team to be a part of your care! It was a pleasure speaking with you today!  CCM (Chronic Care Management) Team   Om Lizotte RN, BSN Nurse Care Coordinator  [phone]  [facility] PharmD  Clinical Pharmacist  [phone]  Eula Fried LCSW Clinical Social Worker [phone]  Goals Addressed            This Visit's Progress   . RN-I have several health problems (pt-stated)       Current Barriers:  . Film/video editor.  . Chronic Disease Management support and education needs related to asthma, HTN, Angina, DMII and Chronic back pain related to degenerative disc and bilateral sciatica  Nurse Case Manager Clinical Goal(s):  Marland Kitchen Over the next 90 days, patient will work with [facility]  to address needs related to Managing multiple chronic disease processes   Interventions:  . Evaluation of current treatment plan related to chronic back pain and patient's adherence to plan as established by provider. . Provided education to patient re: obtaining a scooter for patient's expressed need related to inability to walk longer distances which prohibits his ability to get groceries and complete his IADLs  . Collaborated with PCP, front office staff and DME company  regarding patient's need for POV/Scooter . Discussed plans with patient for ongoing care management follow up and provided patient with direct contact information for care management team . Patient stated he would like to use the same company his spouse did, looked up this information- [employer] [phone] opt [phone] ext [phone] . Placed a call to [place] they are faxing needed paperwork to PCP office . Patient stated when he walks long distance he gets Chest pain and SOB, he stated he recently went to see his cardiologist and he increased his medication which helped the chest pain.  . Patient stated he also recently saw his pulmonologist and will  be scheduled for PFTs and chest xray at next visit.   Patient Self Care Activities:  . Patient verbalizes understanding of plan to obtain POV . Unable to perform IADLs independently  Initial goal documentation        The patient verbalized understanding of instructions provided today and declined a print copy of patient instruction materials.   The patient has been provided with contact information for the care management team and has been advised to call with any health related questions or concerns.

## 2019-01-20 ENCOUNTER — Other Ambulatory Visit: Payer: Self-pay | Admitting: Student in an Organized Health Care Education/Training Program

## 2019-01-20 DIAGNOSIS — G894 Chronic pain syndrome: Secondary | ICD-10-CM

## 2019-01-21 ENCOUNTER — Other Ambulatory Visit: Payer: Self-pay | Admitting: Family Medicine

## 2019-01-21 MED ORDER — MELOXICAM 7.5 MG PO TABS: 7.5000 mg | ORAL_TABLET | Freq: Every day | ORAL | 2 refills | Status: DC

## 2019-01-21 MED ORDER — FUROSEMIDE 20 MG PO TABS: ORAL_TABLET | ORAL | 1 refills | Status: DC

## 2019-01-21 MED ORDER — CETIRIZINE HCL 10 MG PO TABS: 10.0000 mg | ORAL_TABLET | Freq: Every day | ORAL | 1 refills | Status: DC

## 2019-01-21 NOTE — Telephone Encounter (Signed)
Requested medication (s) are due for refill today:no  Requested medication (s) are on the active medication list: no  Last refill:  10/20/2018  Future visit scheduled: yes  Notes to clinic:  looks like medication was d/c by different provider    Requested Prescriptions  Pending Prescriptions Disp Refills   lisinopril (ZESTRIL) 10 MG tablet [Pharmacy Med Name: LISINOPRIL 10MG  TABLETS] 90 tablet 1    Sig: TAKE 1 TABLET(10 MG) BY MOUTH DAILY      Cardiovascular:  ACE Inhibitors Passed - 01/21/2019  3:35 AM      Passed - Cr in normal range and within 180 days    Creatinine, Ser  Date Value Ref Range Status  12/24/2018 1.02 0.76 - 1.27 mg/dL Final          Passed - K in normal range and within 180 days    Potassium  Date Value Ref Range Status  12/24/2018 3.9 3.5 - 5.2 mmol/L Final          Passed - Patient is not pregnant      Passed - Last BP in normal range    BP Readings from Last 1 Encounters:  12/24/18 105/77          Passed - Valid encounter within last 6 months    Recent Outpatient Visits           4 weeks ago Type 2 diabetes mellitus with hyperglycemia, with long-term current use of insulin [facility])   [facility] Volney American, [place]   2 months ago Pain, dental   [place], [place], [place]   4 months ago Essential hypertension   [place], Jeannette How, [place]   7 months ago Insulin dependent diabetes mellitus [facility])   [place], [place], [place]   9 months ago Insulin dependent diabetes mellitus [facility])   [place], Lilia Argue, [place]       Future Appointments             In 5 months Orene Desanctis, Lilia Argue, PA-C [facility], [place]

## 2019-01-22 ENCOUNTER — Ambulatory Visit: Payer: Self-pay | Admitting: *Deleted

## 2019-01-22 DIAGNOSIS — E1165 Type 2 diabetes mellitus with hyperglycemia: Secondary | ICD-10-CM | POA: Diagnosis not present

## 2019-01-22 DIAGNOSIS — J4540 Moderate persistent asthma, uncomplicated: Secondary | ICD-10-CM

## 2019-01-22 DIAGNOSIS — G8929 Other chronic pain: Secondary | ICD-10-CM

## 2019-01-22 DIAGNOSIS — G894 Chronic pain syndrome: Secondary | ICD-10-CM

## 2019-01-22 DIAGNOSIS — R531 Weakness: Secondary | ICD-10-CM

## 2019-01-22 NOTE — Chronic Care Management (AMB) (Signed)
Chronic Care Management   Follow Up Note   01/22/2019 Name: Corey [name] MRN: [id number] DOB: 1955/02/14  Referred by: Volney American, PA-C Reason for referral : Care Coordination (DME paperwork for POV)   Corey [name] is a 63 y.o. year old male who is a primary care patient of Volney American, [place]. The CCM team was consulted for assistance with chronic disease management and care coordination needs.    Review of patient status, including review of consultants reports, relevant laboratory and other test results, and collaboration with appropriate care team members and the patient's provider was performed as part of comprehensive patient evaluation and provision of chronic care management services.    SDOH (Social Determinants of Health) screening performed today: None. See Care Plan for related entries.   Outpatient Encounter Medications as of 01/22/2019  Medication Sig Note  . Accu-Chek FastClix Lancets MISC USE TO CHECK BLOOD SUGAR   . ACCU-CHEK GUIDE test strip USE TO CHECK BLOOD SUGAR TID   . acetaminophen (TYLENOL) 500 MG tablet Take 1,000 mg by mouth every 6 (six) hours as needed for moderate pain or headache.   . albuterol (PROVENTIL) (2.5 MG/3ML) 0.083% nebulizer solution USE 1 VIAL IN NEBULIZER EVERY 6 HOURS - and as needed.   Marland Kitchen albuterol (VENTOLIN HFA) 108 (90 Base) MCG/ACT inhaler INHALE 2 PUFFS INTO THE LUNGS EVERY 6 HOURS AS NEEDED FOR WHEEZING OR SHORTNESS OF BREATH   . amitriptyline (ELAVIL) 50 MG tablet Take 1-2 tablets (50-100 mg total) by mouth at bedtime.   Marland Kitchen aspirin EC 81 MG tablet Take 81 mg by mouth daily.   Marland Kitchen azelastine (ASTELIN) 0.1 % nasal spray Place 2 sprays into both nostrils 2 (two) times daily. Use in each nostril as directed   . B-D UF III MINI PEN NEEDLES 31G X 5 MM MISC USE TWICE DAILY   . Blood Glucose Monitoring Suppl (ACCU-CHEK GUIDE) w/Device KIT U UTD   . buPROPion (WELLBUTRIN XL) 150 MG 24 hr tablet TAKE 1 TABLET(150 MG) BY  MOUTH DAILY   . cetirizine (ZYRTEC) 10 MG tablet Take 1 tablet (10 mg total) by mouth daily.   . Cholecalciferol (VITAMIN D3) 5000 units TABS Take 5,000 Units by mouth daily.    . citalopram (CELEXA) 10 MG tablet TAKE 1 TABLET BY MOUTH  DAILY   . clopidogrel (PLAVIX) 75 MG tablet Take by mouth.   . diclofenac sodium (VOLTAREN) 1 % GEL Apply 1 application topically 4 (four) times daily as needed (pain).    . Fluticasone-Salmeterol (ADVAIR) 500-50 MCG/DOSE AEPB Inhale 1 puff into the lungs 2 (two) times daily.   . furosemide (LASIX) 20 MG tablet TAKE 1 TABLET(20 MG) BY MOUTH TWICE DAILY   . GLYXAMBI 25-5 MG TABS TAKE 1 TABLET BY MOUTH IN  THE MORNING   . Insulin Syringe-Needle U-100 30G X 1/2" 1 ML MISC 1 Units by Does not apply route daily.   . Lancets Misc. (ACCU-CHEK FASTCLIX LANCET) KIT 1 Units by Does not apply route 2 (two) times daily.   Marland Kitchen LANTUS SOLOSTAR 100 UNIT/ML Solostar Pen INJECT 75 UNITS [place] D. (Patient taking differently: 80 Units. INJECT 75 UNITS [place] D.) 01/18/2019: 85 units   . lisinopril (ZESTRIL) 10 MG tablet TAKE 1 TABLET(10 MG) BY MOUTH DAILY   . losartan (COZAAR) 50 MG tablet Take 50 mg by mouth daily.   . magnesium gluconate (MAGONATE) 500 MG tablet Take 500 mg by mouth daily.   . meloxicam (MOBIC) 7.5 MG  tablet Take 1 tablet (7.5 mg total) by mouth daily.   . metFORMIN (GLUCOPHAGE) 1000 MG tablet TAKE 1 TABLET BY MOUTH  TWICE DAILY   . metoprolol succinate (TOPROL-XL) 25 MG 24 hr tablet Take 25 mg by mouth daily.   . montelukast (SINGULAIR) 10 MG tablet TAKE 1 TABLET(10 MG) BY MOUTH DAILY   . pantoprazole (PROTONIX) 40 MG tablet Take 40 mg by mouth daily.   . pregabalin (LYRICA) 100 MG capsule Take 1 capsule (100 mg total) by mouth 2 (two) times daily.   . primidone (MYSOLINE) 50 MG tablet TAKE 2 TABLETS(100 MG) BY MOUTH DAILY   . rosuvastatin (CRESTOR) 20 MG tablet Take 20 mg by mouth daily.   . Semaglutide, 1 MG/DOSE, (OZEMPIC, 1 MG/DOSE,) 2 MG/1.5ML SOPN Inject 1 mg  into the skin once a week.   . sucralfate (CARAFATE) 1 g tablet TAKE 1 TABLET(1 GRAM) BY MOUTH THREE TIMES DAILY AS NEEDED    No facility-administered encounter medications on file as of 01/22/2019.     Goals Addressed            This Visit's Progress   . RN-I have several health problems (pt-stated)       Current Barriers:  . Film/video editor.  . Chronic Disease Management support and education needs related to asthma, HTN, Angina, DMII and Chronic back pain related to degenerative disc and bilateral sciatica  Nurse Case Manager Clinical Goal(s):  Marland Kitchen Over the next 90 days, patient will work with [facility]  to address needs related to Managing multiple chronic disease processes   Interventions:  . Collaborated with PCP clinical staff  regarding patient's need for office visit to be evaluated specifically for POV/Scooter . Inbasket message to PCP with note requirements for POV mobility office visit . PCP CMA scheduled patient for office visit 12/28.  Marland Kitchen Plan to fax paperwork after visit  Patient Self Care Activities:  . Patient verbalizes understanding of plan to obtain POV . Unable to perform IADLs independently  Please see past updates related to this goal by clicking on the "Past Updates" button in the selected goal          The care management team will reach out to the patient again over the next 60 days.    Merlene Morse Robertha Staples RN, BSN Nurse Case Editor, commissioning [facility] Care Management  [phone]) Business Mobile

## 2019-01-27 ENCOUNTER — Other Ambulatory Visit: Payer: Self-pay | Admitting: Student in an Organized Health Care Education/Training Program

## 2019-01-27 DIAGNOSIS — G894 Chronic pain syndrome: Secondary | ICD-10-CM

## 2019-01-28 ENCOUNTER — Ambulatory Visit (INDEPENDENT_AMBULATORY_CARE_PROVIDER_SITE_OTHER): Payer: Medicare Other | Admitting: Family Medicine

## 2019-01-28 ENCOUNTER — Encounter: Payer: Self-pay | Admitting: Family Medicine

## 2019-01-28 ENCOUNTER — Other Ambulatory Visit: Payer: Self-pay

## 2019-01-28 VITALS — BP 126/75 | HR 83 | Temp 98.4°F | Ht 66.0 in | Wt 220.0 lb

## 2019-01-28 DIAGNOSIS — M533 Sacrococcygeal disorders, not elsewhere classified: Secondary | ICD-10-CM | POA: Diagnosis not present

## 2019-01-28 DIAGNOSIS — M5136 Other intervertebral disc degeneration, lumbar region: Secondary | ICD-10-CM | POA: Diagnosis not present

## 2019-01-28 DIAGNOSIS — M5442 Lumbago with sciatica, left side: Secondary | ICD-10-CM

## 2019-01-28 DIAGNOSIS — M5441 Lumbago with sciatica, right side: Secondary | ICD-10-CM

## 2019-01-28 DIAGNOSIS — M170 Bilateral primary osteoarthritis of knee: Secondary | ICD-10-CM | POA: Diagnosis not present

## 2019-01-28 DIAGNOSIS — G8929 Other chronic pain: Secondary | ICD-10-CM

## 2019-01-28 MED ORDER — NYSTATIN 100000 UNIT/GM EX CREA: 1.0000 "application " | TOPICAL_CREAM | Freq: Two times a day (BID) | CUTANEOUS | 0 refills | Status: DC

## 2019-01-28 NOTE — Progress Notes (Addendum)
BP 126/75   Pulse 83   Temp 98.4 F (36.9 C) (Oral)   Ht 5\' 6"  (1.676 m)   Wt 220 lb (99.8 kg)   SpO2 94%   BMI 35.51 kg/m    Subjective:    Patient ID: Corey [name], male    DOB: 01/31/1956, 63 y.o.   MRN: [id number]  HPI: Corey [name] is a 63 y.o. male  Chief Complaint  Patient presents with  . Paperwork    to get a scooter   Patient presenting today for a Mobility evaluation for a motorized scooter to assist with ADLs, particularly when running errands in larger store settings or even getting around inside his home (for toileting, cooking, etc) on particularly difficult days where his arthritis is flaring. Does not have the hand strength or stamina to operate a wheelchair to help with these tasks due to his arthritic conditions and pain is prohibitive of utilizing a cane for walking. Has a long history of chronic back pain that causes b/l sciatica and severe arthritis of both hips and knees. Pain is a 10/10 with walking and 5/10 with rest, worst in the back and hips. Patient is capable of physically and mentally operate a power chair device safely, able to transfer on and off the dvice independently and can maintain postural stability while on the device.   Relevant past medical, surgical, family and social history reviewed and updated as indicated. Interim medical history since our last visit reviewed. Allergies and medications reviewed and updated.  Review of Systems  Per HPI unless specifically indicated above     Objective:    BP 126/75   Pulse 83   Temp 98.4 F (36.9 C) (Oral)   Ht 5\' 6"  (1.676 m)   Wt 220 lb (99.8 kg)   SpO2 94%   BMI 35.51 kg/m   Wt Readings from Last 3 Encounters:  01/28/19 220 lb (99.8 kg)  12/24/18 219 lb (99.3 kg)  12/06/18 212 lb (96.2 kg)    Physical Exam Vitals and nursing note reviewed.  Constitutional:      Appearance: Normal appearance.  HENT:     Head: Atraumatic.     Nose: Nose normal.     Mouth/Throat:   Mouth: Mucous membranes are moist.     Pharynx: Oropharynx is clear.  Eyes:     Extraocular Movements: Extraocular movements intact.     Conjunctiva/sclera: Conjunctivae normal.  Cardiovascular:     Rate and Rhythm: Normal rate and regular rhythm.  Pulmonary:     Effort: Pulmonary effort is normal.     Breath sounds: Normal breath sounds.  Musculoskeletal:     Cervical back: Neck supple.     Comments: Strength b/l UEs 3/5  Strength b/l LEs 3/5 Antalgic but steady gait, ambulates about 30 ft without significant issue but beyond that point needs rest due to pain level   Skin:    General: Skin is warm and dry.  Neurological:     General: No focal deficit present.     Mental Status: He is oriented to person, place, and time.  Psychiatric:        Mood and Affect: Mood normal.        Thought Content: Thought content normal.        Judgment: Judgment normal.   Oxygen saturation with ambulation 93-94%.   Results for orders placed or performed in visit on 12/24/18  HM DIABETES EYE EXAM  Result Value Ref Range  HM Diabetic Eye Exam No Retinopathy No Retinopathy      Assessment & Plan:   Problem List Items Addressed This Visit      Nervous and Auditory   Chronic bilateral low back pain with bilateral sciatica - Primary     Musculoskeletal and Integument   Lumbar degenerative disc disease   Bilateral primary osteoarthritis of knee     Other   Sacroiliac joint pain      Mobility evaluation completed today, do feel the patient would benefit from a motorized scooter and that this would enhance his ability to perform ADLs and care for himself independently in a safer way. Forms completed and will be faxed per request.   25 minutes spent today in direct evaluation and coordination of care today.   Follow up plan: Return for as scheduled.

## 2019-01-29 ENCOUNTER — Ambulatory Visit: Payer: Self-pay | Admitting: *Deleted

## 2019-01-29 DIAGNOSIS — G8929 Other chronic pain: Secondary | ICD-10-CM

## 2019-01-29 DIAGNOSIS — M5441 Lumbago with sciatica, right side: Secondary | ICD-10-CM

## 2019-01-29 DIAGNOSIS — J4540 Moderate persistent asthma, uncomplicated: Secondary | ICD-10-CM

## 2019-01-29 DIAGNOSIS — M170 Bilateral primary osteoarthritis of knee: Secondary | ICD-10-CM

## 2019-01-29 NOTE — Chronic Care Management (AMB) (Cosign Needed)
Chronic Care Management   Follow Up Note   01/29/2019 Name: Corey [name] MRN: [id number] DOB: 1956/01/13  Referred by: Volney American, PA-C Reason for referral : Care Coordination (POV/Scooter)   Corey [name] is a 63 y.o. year old male who is a primary care patient of Volney American, [place]. The CCM team was consulted for assistance with chronic disease management and care coordination needs.    Review of patient status, including review of consultants reports, relevant laboratory and other test results, and collaboration with appropriate care team members and the patient's provider was performed as part of comprehensive patient evaluation and provision of chronic care management services.    SDOH (Social Determinants of Health) screening performed today: Physical Activity. See Care Plan for related entries.   Outpatient Encounter Medications as of 01/29/2019  Medication Sig Note  . Accu-Chek FastClix Lancets MISC USE TO CHECK BLOOD SUGAR   . ACCU-CHEK GUIDE test strip USE TO CHECK BLOOD SUGAR TID   . acetaminophen (TYLENOL) 500 MG tablet Take 1,000 mg by mouth every 6 (six) hours as needed for moderate pain or headache.   . albuterol (PROVENTIL) (2.5 MG/3ML) 0.083% nebulizer solution USE 1 VIAL IN NEBULIZER EVERY 6 HOURS - and as needed.   Marland Kitchen albuterol (VENTOLIN HFA) 108 (90 Base) MCG/ACT inhaler INHALE 2 PUFFS INTO THE LUNGS EVERY 6 HOURS AS NEEDED FOR WHEEZING OR SHORTNESS OF BREATH   . amitriptyline (ELAVIL) 50 MG tablet Take 1-2 tablets (50-100 mg total) by mouth at bedtime.   Marland Kitchen aspirin EC 81 MG tablet Take 81 mg by mouth daily.   Marland Kitchen azelastine (ASTELIN) 0.1 % nasal spray Place 2 sprays into both nostrils 2 (two) times daily. Use in each nostril as directed   . B-D UF III MINI PEN NEEDLES 31G X 5 MM MISC USE TWICE DAILY   . Blood Glucose Monitoring Suppl (ACCU-CHEK GUIDE) w/Device KIT U UTD   . buPROPion (WELLBUTRIN XL) 150 MG 24 hr tablet TAKE 1 TABLET(150 MG)  BY MOUTH DAILY   . cetirizine (ZYRTEC) 10 MG tablet Take 1 tablet (10 mg total) by mouth daily.   . Cholecalciferol (VITAMIN D3) 5000 units TABS Take 5,000 Units by mouth daily.    . citalopram (CELEXA) 10 MG tablet TAKE 1 TABLET BY MOUTH  DAILY   . clopidogrel (PLAVIX) 75 MG tablet Take by mouth.   . diclofenac sodium (VOLTAREN) 1 % GEL Apply 1 application topically 4 (four) times daily as needed (pain).    . Fluticasone-Salmeterol (ADVAIR) 500-50 MCG/DOSE AEPB Inhale 1 puff into the lungs 2 (two) times daily.   . furosemide (LASIX) 20 MG tablet TAKE 1 TABLET(20 MG) BY MOUTH TWICE DAILY   . GLYXAMBI 25-5 MG TABS TAKE 1 TABLET BY MOUTH IN  THE MORNING   . Insulin Syringe-Needle U-100 30G X 1/2" 1 ML MISC 1 Units by Does not apply route daily.   . Lancets Misc. (ACCU-CHEK FASTCLIX LANCET) KIT 1 Units by Does not apply route 2 (two) times daily.   Marland Kitchen LANTUS SOLOSTAR 100 UNIT/ML Solostar Pen INJECT 75 UNITS [place] D. (Patient taking differently: 80 Units. INJECT 75 UNITS [place] D.) 01/18/2019: 85 units   . lisinopril (ZESTRIL) 10 MG tablet TAKE 1 TABLET(10 MG) BY MOUTH DAILY   . losartan (COZAAR) 50 MG tablet Take 50 mg by mouth daily.   . magnesium gluconate (MAGONATE) 500 MG tablet Take 500 mg by mouth daily.   . meloxicam (MOBIC) 7.5 MG tablet Take  1 tablet (7.5 mg total) by mouth daily.   . metFORMIN (GLUCOPHAGE) 1000 MG tablet TAKE 1 TABLET BY MOUTH  TWICE DAILY   . metoprolol succinate (TOPROL-XL) 25 MG 24 hr tablet Take 25 mg by mouth daily.   . montelukast (SINGULAIR) 10 MG tablet TAKE 1 TABLET(10 MG) BY MOUTH DAILY   . nystatin cream (MYCOSTATIN) Apply 1 application topically 2 (two) times daily.   . pantoprazole (PROTONIX) 40 MG tablet Take 40 mg by mouth daily.   . pregabalin (LYRICA) 100 MG capsule Take 1 capsule (100 mg total) by mouth 2 (two) times daily.   . primidone (MYSOLINE) 50 MG tablet TAKE 2 TABLETS(100 MG) BY MOUTH DAILY   . rosuvastatin (CRESTOR) 20 MG tablet Take 20 mg by mouth  daily.   . Semaglutide, 1 MG/DOSE, (OZEMPIC, 1 MG/DOSE,) 2 MG/1.5ML SOPN Inject 1 mg into the skin once a week.   . sucralfate (CARAFATE) 1 g tablet TAKE 1 TABLET(1 GRAM) BY MOUTH THREE TIMES DAILY AS NEEDED    No facility-administered encounter medications on file as of 01/29/2019.     Goals Addressed            This Visit's Progress   . RN-I have several health problems (pt-stated)       Current Barriers:  . Film/video editor.  . Chronic Disease Management support and education needs related to asthma, HTN, Angina, DMII and Chronic back pain related to degenerative disc and bilateral sciatica  Nurse Case Manager Clinical Goal(s):  Marland Kitchen Over the next 90 days, patient will work with [facility]  to address needs related to Managing multiple chronic disease processes   Interventions:  . Collaborated with PCP clinical staff  regarding patient's need for office visit to be evaluated specifically for POV/Scooter . Follow up on paperwork to be completed for POV/Scooter. PCP completed mobility visit.   . Plan to fax paperwork after visit  Patient Self Care Activities:  . Patient verbalizes understanding of plan to obtain POV . Unable to perform IADLs independently  Please see past updates related to this goal by clicking on the "Past Updates" button in the selected goal          The care management team will reach out to the patient again over the next 60 days.    Merlene Morse Yadira Hada RN, BSN Nurse Case Editor, commissioning Family Practice/[facility] Care Management  [phone]) Business Mobile

## 2019-02-01 NOTE — Patient Instructions (Signed)
Thank you allowing the Chronic Care Management Team to be a part of your care! It was a pleasure speaking with you today!  CCM (Chronic Care Management) Team   Migdalia Olejniczak RN, BSN Nurse Care Coordinator  [phone]  [facility] PharmD  Clinical Pharmacist  [phone]  Dickie La LCSW Clinical Social Worker [phone]  Goals Addressed            This Visit's Progress   . RN-I have several health problems (pt-stated)       Current Barriers:  . Corporate treasurer.  . Chronic Disease Management support and education needs related to asthma, HTN, Angina, DMII and Chronic back pain related to degenerative disc and bilateral sciatica  Nurse Case Manager Clinical Goal(s):  Marland Kitchen Over the next 90 days, patient will work with [facility]  to address needs related to Managing multiple chronic disease processes   Interventions:  . Collaborated with PCP clinical staff  regarding patient's need for office visit to be evaluated specifically for POV/Scooter . Follow up on paperwork to be completed for POV/Scooter. PCP completed mobility visit.   . Plan to fax paperwork after visit  Patient Self Care Activities:  . Patient verbalizes understanding of plan to obtain POV . Unable to perform IADLs independently  Please see past updates related to this goal by clicking on the "Past Updates" button in the selected goal         The patient verbalized understanding of instructions provided today and declined a print copy of patient instruction materials.   The patient has been provided with contact information for the care management team and has been advised to call with any health related questions or concerns.

## 2019-02-03 ENCOUNTER — Other Ambulatory Visit: Payer: Self-pay | Admitting: Family Medicine

## 2019-02-03 ENCOUNTER — Other Ambulatory Visit: Payer: Self-pay | Admitting: Student in an Organized Health Care Education/Training Program

## 2019-02-03 DIAGNOSIS — E1141 Type 2 diabetes mellitus with diabetic mononeuropathy: Secondary | ICD-10-CM

## 2019-02-03 DIAGNOSIS — G894 Chronic pain syndrome: Secondary | ICD-10-CM

## 2019-02-04 NOTE — Telephone Encounter (Signed)
Routing to provider  

## 2019-02-05 ENCOUNTER — Telehealth: Payer: Self-pay

## 2019-02-06 ENCOUNTER — Ambulatory Visit: Payer: Self-pay | Admitting: *Deleted

## 2019-02-06 ENCOUNTER — Other Ambulatory Visit: Payer: Self-pay | Admitting: Family Medicine

## 2019-02-06 DIAGNOSIS — J4540 Moderate persistent asthma, uncomplicated: Secondary | ICD-10-CM

## 2019-02-06 DIAGNOSIS — E1165 Type 2 diabetes mellitus with hyperglycemia: Secondary | ICD-10-CM

## 2019-02-06 DIAGNOSIS — G894 Chronic pain syndrome: Secondary | ICD-10-CM

## 2019-02-06 DIAGNOSIS — G8929 Other chronic pain: Secondary | ICD-10-CM

## 2019-02-06 DIAGNOSIS — M170 Bilateral primary osteoarthritis of knee: Secondary | ICD-10-CM

## 2019-02-06 DIAGNOSIS — R531 Weakness: Secondary | ICD-10-CM

## 2019-02-06 DIAGNOSIS — M5441 Lumbago with sciatica, right side: Secondary | ICD-10-CM

## 2019-02-06 NOTE — Telephone Encounter (Signed)
Patient last seen 01/28/19

## 2019-02-06 NOTE — Chronic Care Management (AMB) (Signed)
Chronic Care Management   Follow Up Note   02/06/2019 Name: OLE LAFON MRN: [id number] DOB: 16-Apr-1955  Referred by: Volney American, PA-C Reason for referral : No chief complaint on file.   Corey [name] is a 64 y.o. year old male who is a primary care patient of Volney American, [place]. The CCM team was consulted for assistance with chronic disease management and care coordination needs.    Review of patient status, including review of consultants reports, relevant laboratory and other test results, and collaboration with appropriate care team members and the patient's provider was performed as part of comprehensive patient evaluation and provision of chronic care management services.    SDOH (Social Determinants of Health) screening performed today: Physical Activity. See Care Plan for related entries.   Outpatient Encounter Medications as of 02/06/2019  Medication Sig Note  . Accu-Chek FastClix Lancets MISC USE TO CHECK BLOOD SUGAR   . ACCU-CHEK GUIDE test strip USE TO CHECK BLOOD SUGAR TID   . acetaminophen (TYLENOL) 500 MG tablet Take 1,000 mg by mouth every 6 (six) hours as needed for moderate pain or headache.   . albuterol (PROVENTIL) (2.5 MG/3ML) 0.083% nebulizer solution USE 1 VIAL IN NEBULIZER EVERY 6 HOURS - and as needed.   Marland Kitchen albuterol (VENTOLIN HFA) 108 (90 Base) MCG/ACT inhaler INHALE 2 PUFFS INTO THE LUNGS EVERY 6 HOURS AS NEEDED FOR WHEEZING OR SHORTNESS OF BREATH   . amitriptyline (ELAVIL) 50 MG tablet Take 1-2 tablets (50-100 mg total) by mouth at bedtime.   Marland Kitchen aspirin EC 81 MG tablet Take 81 mg by mouth daily.   Marland Kitchen azelastine (ASTELIN) 0.1 % nasal spray Place 2 sprays into both nostrils 2 (two) times daily. Use in each nostril as directed   . B-D UF III MINI PEN NEEDLES 31G X 5 MM MISC USE TWICE DAILY   . Blood Glucose Monitoring Suppl (ACCU-CHEK GUIDE) w/Device KIT U UTD   . buPROPion (WELLBUTRIN XL) 150 MG 24 hr tablet TAKE 1 TABLET(150 MG) BY MOUTH  DAILY   . cetirizine (ZYRTEC) 10 MG tablet Take 1 tablet (10 mg total) by mouth daily.   . Cholecalciferol (VITAMIN D3) 5000 units TABS Take 5,000 Units by mouth daily.    . citalopram (CELEXA) 10 MG tablet TAKE 1 TABLET BY MOUTH  DAILY   . clopidogrel (PLAVIX) 75 MG tablet Take by mouth.   . diclofenac sodium (VOLTAREN) 1 % GEL Apply 1 application topically 4 (four) times daily as needed (pain).    . Fluticasone-Salmeterol (ADVAIR) 500-50 MCG/DOSE AEPB Inhale 1 puff into the lungs 2 (two) times daily.   . furosemide (LASIX) 20 MG tablet TAKE 1 TABLET(20 MG) BY MOUTH TWICE DAILY   . GLYXAMBI 25-5 MG TABS TAKE 1 TABLET BY MOUTH IN  THE MORNING   . Insulin Syringe-Needle U-100 30G X 1/2" 1 ML MISC 1 Units by Does not apply route daily.   . Lancets Misc. (ACCU-CHEK FASTCLIX LANCET) KIT 1 Units by Does not apply route 2 (two) times daily.   Marland Kitchen LANTUS SOLOSTAR 100 UNIT/ML Solostar Pen INJECT 75 UNITS [place] D. (Patient taking differently: 80 Units. INJECT 75 UNITS [place] D.) 01/18/2019: 85 units   . lisinopril (ZESTRIL) 10 MG tablet TAKE 1 TABLET(10 MG) BY MOUTH DAILY   . losartan (COZAAR) 50 MG tablet Take 50 mg by mouth daily.   . magnesium gluconate (MAGONATE) 500 MG tablet Take 500 mg by mouth daily.   . meloxicam (MOBIC) 7.5 MG  tablet Take 1 tablet (7.5 mg total) by mouth daily.   . metFORMIN (GLUCOPHAGE) 1000 MG tablet TAKE 1 TABLET BY MOUTH  TWICE DAILY   . metoprolol succinate (TOPROL-XL) 25 MG 24 hr tablet Take 25 mg by mouth daily.   . montelukast (SINGULAIR) 10 MG tablet TAKE 1 TABLET(10 MG) BY MOUTH DAILY   . nystatin cream (MYCOSTATIN) Apply 1 application topically 2 (two) times daily.   . pantoprazole (PROTONIX) 40 MG tablet Take 40 mg by mouth daily.   . pregabalin (LYRICA) 100 MG capsule TAKE 1 CAPSULE(100 MG) BY MOUTH TWICE DAILY   . primidone (MYSOLINE) 50 MG tablet TAKE 2 TABLETS(100 MG) BY MOUTH DAILY   . rosuvastatin (CRESTOR) 20 MG tablet Take 20 mg by mouth daily.   . Semaglutide, 1  MG/DOSE, (OZEMPIC, 1 MG/DOSE,) 2 MG/1.5ML SOPN Inject 1 mg into the skin once a week.   . sucralfate (CARAFATE) 1 g tablet TAKE 1 TABLET(1 GRAM) BY MOUTH THREE TIMES DAILY AS NEEDED    No facility-administered encounter medications on file as of 02/06/2019.     Goals Addressed            This Visit's Progress   . RN-I have several health problems (pt-stated)       Current Barriers:  . Film/video editor.  . Chronic Disease Management support and education needs related to asthma, HTN, Angina, DMII and Chronic back pain related to degenerative disc and bilateral sciatica  Nurse Case Manager Clinical Goal(s):  Marland Kitchen Over the next 90 days, patient will work with [facility]  to address needs related to Managing multiple chronic disease processes   Interventions:  . Collaborated with PCP clinical staff  regarding patient's need for office visit to be evaluated specifically for POV/Scooter . All paperwork faxed including office notes to Otila Back at [facility] division. Fax # [phone]  Patient Self Care Activities:  . Patient verbalizes understanding of plan to obtain POV . Unable to perform IADLs independently  Please see past updates related to this goal by clicking on the "Past Updates" button in the selected goal          The patient has been provided with contact information for the care management team and has been advised to call with any health related questions or concerns.    Merlene Morse Elizaveta Mattice RN, BSN Nurse Case Editor, [facility]ing [facility]  [phone]) Business Mobile

## 2019-02-06 NOTE — Telephone Encounter (Signed)
Requested medication (s) are due for refill today: yes  Requested medication (s) are on the active medication list: yes  Last refill:  01/02/2019  Future visit scheduled: yes  Notes to clinic:  Patient requests 90 days supply   Requested Prescriptions  Pending Prescriptions Disp Refills   sucralfate (CARAFATE) 1 g tablet [Pharmacy Med Name: SUCRALFATE 1GM TABLETS] 270 tablet     Sig: TAKE 1 TABLET(1 GRAM) BY MOUTH THREE TIMES DAILY AS NEEDED      Gastroenterology: Antiacids Passed - 02/06/2019  3:35 AM      Passed - Valid encounter within last 12 months    Recent Outpatient Visits           1 week ago Chronic bilateral low back pain with bilateral sciatica   [facility] Particia Nearing, PA-C   1 month ago Type 2 diabetes mellitus with hyperglycemia, with long-term current use of insulin [facility])   [facility], Salley Hews, [place]   2 months ago Pain, dental   [facility] Particia Nearing, [place]   5 months ago Essential hypertension   [facility] [place], Redge Gainer, [place]   7 months ago Insulin dependent diabetes mellitus [facility])   [facility], Salley Hews, [place]       Future Appointments             In 4 months Maurice March, [place] He[place], PA-C [facility], [facility]

## 2019-02-13 ENCOUNTER — Ambulatory Visit (INDEPENDENT_AMBULATORY_CARE_PROVIDER_SITE_OTHER): Payer: 59 | Admitting: Licensed Clinical Social Worker

## 2019-02-13 DIAGNOSIS — M170 Bilateral primary osteoarthritis of knee: Secondary | ICD-10-CM

## 2019-02-13 DIAGNOSIS — G894 Chronic pain syndrome: Secondary | ICD-10-CM

## 2019-02-13 DIAGNOSIS — F3341 Major depressive disorder, recurrent, in partial remission: Secondary | ICD-10-CM

## 2019-02-13 DIAGNOSIS — E1165 Type 2 diabetes mellitus with hyperglycemia: Secondary | ICD-10-CM | POA: Diagnosis not present

## 2019-02-13 DIAGNOSIS — G8929 Other chronic pain: Secondary | ICD-10-CM

## 2019-02-13 DIAGNOSIS — J4540 Moderate persistent asthma, uncomplicated: Secondary | ICD-10-CM | POA: Diagnosis not present

## 2019-02-13 DIAGNOSIS — Z794 Long term (current) use of insulin: Secondary | ICD-10-CM

## 2019-02-13 DIAGNOSIS — M5442 Lumbago with sciatica, left side: Secondary | ICD-10-CM

## 2019-02-13 DIAGNOSIS — I10 Essential (primary) hypertension: Secondary | ICD-10-CM | POA: Diagnosis not present

## 2019-02-14 NOTE — Chronic Care Management (AMB) (Signed)
Chronic Care Management    Clinical Social Work Follow Up Note  02/14/2019 Name: MAXTON NOREEN MRN: [id number] DOB: 06-24-1955  Lenna Sciara is a 64 y.o. year old male who is a primary care patient of Volney American, [place]. The CCM team was consulted for assistance with [employer] .   Review of patient status, including review of consultants reports, other relevant assessments, and collaboration with appropriate care team members and the patient's provider was performed as part of comprehensive patient evaluation and provision of chronic care management services.    SDOH (Social Determinants of Health) screening performed today: Stress. See Care Plan for related entries.   Advanced Directives Status: <no information> See Care Plan for related entries.   Outpatient Encounter Medications as of 02/13/2019  Medication Sig Note  . Accu-Chek FastClix Lancets MISC USE TO CHECK BLOOD SUGAR   . ACCU-CHEK GUIDE test strip USE TO CHECK BLOOD SUGAR TID   . acetaminophen (TYLENOL) 500 MG tablet Take 1,000 mg by mouth every 6 (six) hours as needed for moderate pain or headache.   . albuterol (PROVENTIL) (2.5 MG/3ML) 0.083% nebulizer solution USE 1 VIAL IN NEBULIZER EVERY 6 HOURS - and as needed.   Marland Kitchen albuterol (VENTOLIN HFA) 108 (90 Base) MCG/ACT inhaler INHALE 2 PUFFS INTO THE LUNGS EVERY 6 HOURS AS NEEDED FOR WHEEZING OR SHORTNESS OF BREATH   . amitriptyline (ELAVIL) 50 MG tablet Take 1-2 tablets (50-100 mg total) by mouth at bedtime.   Marland Kitchen aspirin EC 81 MG tablet Take 81 mg by mouth daily.   Marland Kitchen azelastine (ASTELIN) 0.1 % nasal spray Place 2 sprays into both nostrils 2 (two) times daily. Use in each nostril as directed   . B-D UF III MINI PEN NEEDLES 31G X 5 MM MISC USE TWICE DAILY   . Blood Glucose Monitoring Suppl (ACCU-CHEK GUIDE) w/Device KIT U UTD   . buPROPion (WELLBUTRIN XL) 150 MG 24 hr tablet TAKE 1 TABLET(150 MG) BY MOUTH DAILY   . cetirizine (ZYRTEC) 10 MG tablet Take 1  tablet (10 mg total) by mouth daily.   . Cholecalciferol (VITAMIN D3) 5000 units TABS Take 5,000 Units by mouth daily.    . citalopram (CELEXA) 10 MG tablet TAKE 1 TABLET BY MOUTH  DAILY   . clopidogrel (PLAVIX) 75 MG tablet Take by mouth.   . diclofenac sodium (VOLTAREN) 1 % GEL Apply 1 application topically 4 (four) times daily as needed (pain).    . Fluticasone-Salmeterol (ADVAIR) 500-50 MCG/DOSE AEPB Inhale 1 puff into the lungs 2 (two) times daily.   . furosemide (LASIX) 20 MG tablet TAKE 1 TABLET(20 MG) BY MOUTH TWICE DAILY   . GLYXAMBI 25-5 MG TABS TAKE 1 TABLET BY MOUTH IN  THE MORNING   . Insulin Syringe-Needle U-100 30G X 1/2" 1 ML MISC 1 Units by Does not apply route daily.   . Lancets Misc. (ACCU-CHEK FASTCLIX LANCET) KIT 1 Units by Does not apply route 2 (two) times daily.   Marland Kitchen LANTUS SOLOSTAR 100 UNIT/ML Solostar Pen INJECT 75 UNITS [place] D. (Patient taking differently: 80 Units. INJECT 75 UNITS [place] D.) 01/18/2019: 85 units   . lisinopril (ZESTRIL) 10 MG tablet TAKE 1 TABLET(10 MG) BY MOUTH DAILY   . losartan (COZAAR) 50 MG tablet Take 50 mg by mouth daily.   . magnesium gluconate (MAGONATE) 500 MG tablet Take 500 mg by mouth daily.   . meloxicam (MOBIC) 7.5 MG tablet Take 1 tablet (7.5 mg total) by mouth daily.   Marland Kitchen  metFORMIN (GLUCOPHAGE) 1000 MG tablet TAKE 1 TABLET BY MOUTH  TWICE DAILY   . metoprolol succinate (TOPROL-XL) 25 MG 24 hr tablet Take 25 mg by mouth daily.   . montelukast (SINGULAIR) 10 MG tablet TAKE 1 TABLET(10 MG) BY MOUTH DAILY   . nystatin cream (MYCOSTATIN) Apply 1 application topically 2 (two) times daily.   . pantoprazole (PROTONIX) 40 MG tablet Take 40 mg by mouth daily.   . pregabalin (LYRICA) 100 MG capsule TAKE 1 CAPSULE(100 MG) BY MOUTH TWICE DAILY   . primidone (MYSOLINE) 50 MG tablet TAKE 2 TABLETS(100 MG) BY MOUTH DAILY   . rosuvastatin (CRESTOR) 20 MG tablet Take 20 mg by mouth daily.   . Semaglutide, 1 MG/DOSE, (OZEMPIC, 1 MG/DOSE,) 2 MG/1.5ML SOPN  Inject 1 mg into the skin once a week.   . sucralfate (CARAFATE) 1 g tablet TAKE 1 TABLET(1 GRAM) BY MOUTH THREE TIMES DAILY AS NEEDED    No facility-administered encounter medications on file as of 02/13/2019.     Goals Addressed    . "SW- "I need some more resource education." (pt-stated)       Current Barriers:  . Financial constraints related to managing health care and affording bills . ADL IADL limitations  Clinical Social Work Clinical Goal(s):  Marland Kitchen Over the next 120 days, patient will work with SW to address concerns related to lack of financial, personal care, transportation resource education.   Interventions: . Discussed plans with patient for ongoing care management follow up and provided patient with direct contact information for care management team . Advised patient to contact CFP if future transportation issues arise . Assisted patient/caregiver with obtaining information about health plan benefits . Provided education and assistance to client regarding Advanced Directives. . Provided education to patient/caregiver about Hospice and/or Palliative Care services . Patient complains ongoing chronic pain in his knees and back. Reflective listening provided.  Marland Kitchen LCSW provided education on local community resources within his area that would benefit himself and spouse.   Patient Self Care Activities:  . Calls provider office for new concerns or questions . Lacks social connections  Initial goal documentation     Follow Up Plan: SW will follow up with patient by phone over the next quarter  Eula Fried, [place], MSW, [email or web address] Phone: [phone]

## 2019-02-15 ENCOUNTER — Telehealth: Payer: Self-pay | Admitting: Family Medicine

## 2019-02-15 NOTE — Telephone Encounter (Signed)
Clydie Braun with [employer] called and would like to speak with Synetta Fail regarding forms that have been send for provider, she ask for a call back [phone] option 8 and extension [phone].

## 2019-02-15 NOTE — Telephone Encounter (Signed)
Returned call to Clydie Braun. No answer. LVM for her to return my call.

## 2019-02-18 NOTE — Telephone Encounter (Signed)
Called Corey [name].  No answer. LVM for her to return call to the office.

## 2019-02-19 NOTE — Telephone Encounter (Signed)
Forms received from Corey [name] for patient for Power Mobility Device and asked to be filled out asap by provider. Forms in providers folder to fill out and sign.  Routing to provider to advise.

## 2019-02-20 ENCOUNTER — Other Ambulatory Visit: Payer: Self-pay | Admitting: Family Medicine

## 2019-02-20 ENCOUNTER — Ambulatory Visit: Payer: Self-pay | Admitting: Pharmacist

## 2019-02-20 DIAGNOSIS — J4540 Moderate persistent asthma, uncomplicated: Secondary | ICD-10-CM | POA: Diagnosis not present

## 2019-02-20 DIAGNOSIS — E1165 Type 2 diabetes mellitus with hyperglycemia: Secondary | ICD-10-CM | POA: Diagnosis not present

## 2019-02-20 DIAGNOSIS — F3341 Major depressive disorder, recurrent, in partial remission: Secondary | ICD-10-CM

## 2019-02-20 DIAGNOSIS — I10 Essential (primary) hypertension: Secondary | ICD-10-CM

## 2019-02-20 NOTE — Telephone Encounter (Signed)
OV note addended to include missing sentence requested, form re-filled out and signed and given to clinical staff

## 2019-02-20 NOTE — Patient Instructions (Addendum)
Corey [name],   It was great talking to you today! I wanted to give you this updated medication list in writing.   1) STOP LISINOPRIL. Dr. Juliann Pares switched this to losartan in July. 2) STOP ESOMEPRAZOLE. Fleet Contras switched this to pantoprazole in October.   Morning:  - Bupropion XL 150 mg (mood) - Cetirizine 10 mg (allergies) - Vitamin D 1000 units (low Vitamin D) - Citalopram 10 mg (mood) - Clopidogrel 75 mg (preventing your stent from clotting) - Aspirin 81 mg(preventing your stent from clotting) - Furosemide 20 mg (fluid/blood pressure) - Glyxambi 25/5 mg (blood sugars) - Metformin 1000 mg (blood sugars) - Metoprolol XL 25 mg (blood pressure/heart rate) - Pantoprazole 40 mg (acid reflux) - Pregabalin 150 mg (pain) - Meloxicam 7.5 mg (pain)  Afternoon: - Furosemide 20 mg (fluid/blood pressure)  Evening/Bedtime - Amitriptyline 50 mg (pain/sleep) - Metformin 1000 mg (blood sugar) - Losartan 50 mg (blood pressure) - Montelukast 10 mg (allergies) - Pregabalin 150 mg (pain) - Primidone 50 mg (restless leg) - Rosuvastatin 20 mg (cholesterol/prevention of heart attack/stroke)  I also have that you take:  Lantus 85 units daily (diabetes) Ozempic 1 mg weekly (diabetes) Advair (or Wixela) inhaler twice daily (asthma) and albuterol inhaler or nebulizer as needed   The sucralfate, if you need it for acid reflux, needs to be taken on an empty stomach, at least 1 hour before a meal. You need to wait at least 2 hours after taking the sucralfate to take the other medications.   Visit Information  Goals Addressed            This Visit's Progress     Patient Stated   . PharmD "I want to take care of my sugars" (pt-stated)       Current Barriers:  . Diabetes: uncontrolled; most recent A1c 8.6%; follows w/ [name] o Received BID pill box that I mailed to him. He notes he has been using, and that this has significant helped him manage his medications and  refills o Notes extensive dental work being done recently, he has had 4-5 teeth pulled already and may need more . Current antihyperglycemic regimen: metformin 1000 mg BID, Ozempic 1 mg weekly, Glyxambi 25/5 mg daily, Lantus 85 units daily, Humalog  . Current exercise: none, limited by chronic lower back pain. RN CM, clinical staff, and PCP are collaborating on mobility needs and DME scooter . Cardiovascular risk reduction (CAD s/p stent placement 08/16/2018 at [facility]) o Current hypertensive regimen: furosemide, losartan 50 mg daily, metoprolol succinate 25 mg daily; noted that lisinopril also appears on medication list.  o Current hyperlipidemia regimen: rosuvastatin 20 mg daily o Current antiplatelet regimen: ASA 81 mg daily, clopidogrel 75 mg daily (likely DAPT duration x 1 year) . GERD: switched from esomeprazole to pantoprazole previously by Roosvelt Maser; appears both have continued to be filled; also on sucralfate 1 g TID PRN . Depression/chronic pain: amitriptyline 50 mg daily, citalopram 10 mg daily, primidone 100 mg daily, pregabalin 100 mg BID, bupropion XL 150, meloxicam 7.5 mg daily . Asthma/Allergic rhinitis: Wixela BID, albuterol PRN, montelukast 10 mg daily, cetirizine 10 mg daily   Pharmacist Clinical Goal(s):  Marland Kitchen Over the next 90 days, patient with work with PharmD and primary care provider to address optimized glycemic management  Interventions: . Reviewed medication fill histories in comparison to prescribed list . Reviewed chart history. Appears patient was on lisinopril, but Dr. Juliann Pares intended him to switch to losartan on 08/02/2018 appointment.  Marland Kitchen  Contacted [facility]. Confirmed that patient filled lisinopril 10 mg 90 day supply on 12/29 (prescribed by Orene Desanctis), but had also filled losartan 50 mg 90 day supply on 01/11/2019.  Marland Kitchen Contacted [facility]. Confirmed w/ Dr. Etta Quill nurse that patient is to be on losartan. Contacted [facility], asked that lisinopril prescription be  canceled.  . Contacted patient, explained the above. He verbalized understanding.  Kandice Robinsons [facility], recommended they d/c esomeprazole prescription.  . Mailing patient an updated medication list  . Patient noted concerns affording food. Placing Care Guide referral.   Patient Self Care Activities:  . Patient will check blood glucose BID, document, and provide at future appointments . Patient will take medications as prescribed . Patient will report any questions or concerns to provider   Please see past updates related to this goal by clicking on the "Past Updates" button in the selected goal         Print copy of patient instructions provided.  Plan:  - Follow up call scheduled 04/10/19  Corey [name], PharmD, [place] [phone]

## 2019-02-20 NOTE — Telephone Encounter (Signed)
Patient last seen 01/28/19 

## 2019-02-20 NOTE — Telephone Encounter (Signed)
Requested medication (s) are due for refill today -yes  Requested medication (s) are on the active medication list -yes  Future visit scheduled -yes  Last refill: 11/23/18   Notes to clinic: Patient is requesting non delegated RX refill.  Requested Prescriptions  Pending Prescriptions Disp Refills   primidone (MYSOLINE) 50 MG tablet [Pharmacy Med Name: PRIMIDONE 50MG  TABLETS] 180 tablet 1    Sig: TAKE 2 TABLETS(100 MG) BY MOUTH DAILY      Not Delegated - Neurology:  Anticonvulsants Failed - 02/20/2019  8:30 AM      Failed - This refill cannot be delegated      Passed - HCT in normal range and within 360 days    Hematocrit  Date Value Ref Range Status  12/24/2018 46.6 37.5 - 51.0 % Final          Passed - HGB in normal range and within 360 days    Hemoglobin  Date Value Ref Range Status  12/24/2018 15.3 13.0 - 17.7 g/dL Final          Passed - PLT in normal range and within 360 days    Platelets  Date Value Ref Range Status  12/24/2018 185 150 - 450 x10E3/uL Final          Passed - WBC in normal range and within 360 days    WBC  Date Value Ref Range Status  12/24/2018 6.4 3.4 - 10.8 x10E3/uL Final          Passed - Valid encounter within last 12 months    Recent Outpatient Visits           3 weeks ago Chronic bilateral low back pain with bilateral sciatica   [facility], [facility]-C   1 month ago Type 2 diabetes mellitus with hyperglycemia, with long-term current use of insulin [facility])   [facility] [place], [place], [place]   3 months ago Pain, dental   [facility] [place], [place]   5 months ago Essential hypertension   [facility] [place], [place], [place]   8 months ago Insulin dependent diabetes mellitus [facility])   [facility] [place], [place], [place]ley Hews       Future Appointments             In 4 months [place], Maurice March, PA-C [facility], [facility]                 Requested Prescriptions  Pending Prescriptions Disp Refills   primidone (MYSOLINE) 50 MG tablet [Pharmacy Med Name: PRIMIDONE 50MG  TABLETS] 180 tablet 1    Sig: TAKE 2 TABLETS(100 MG) BY MOUTH DAILY      Not Delegated - Neurology:  Anticonvulsants Failed - 02/20/2019  8:30 AM      Failed - This refill cannot be delegated      Passed - HCT in normal range and within 360 days    Hematocrit  Date Value Ref Range Status  12/24/2018 46.6 37.5 - 51.0 % Final          Passed - HGB in normal range and within 360 days    Hemoglobin  Date Value Ref Range Status  12/24/2018 15.3 13.0 - 17.7 g/dL Final          Passed - PLT in normal range and within 360 days    Platelets  Date Value Ref Range Status  12/24/2018 185 150 - 450 x10E3/uL Final  Passed - WBC in normal range and within 360 days    WBC  Date Value Ref Range Status  12/24/2018 6.4 3.4 - 10.8 x10E3/uL Final          Passed - Valid encounter within last 12 months    Recent Outpatient Visits           3 weeks ago Chronic bilateral low back pain with bilateral sciatica   [facility] Volney American, PA-C   1 month ago Type 2 diabetes mellitus with hyperglycemia, with long-term current use of insulin [facility])   [facility] Volney American, [place]   3 months ago Pain, dental   [facility] Volney American, [place]   5 months ago Essential hypertension   [place], Jeannette How, [place]   8 months ago Insulin dependent diabetes mellitus [facility])   [facility], Lilia Argue, [place]       Future Appointments             In 4 months Orene Desanctis, Lilia Argue, [place], [facility]

## 2019-02-20 NOTE — Chronic Care Management (AMB) (Signed)
Chronic Care Management   Follow Up Note   02/20/2019 Name: Corey [name] MRN: [id number] DOB: October 17, 1955  Referred by: Volney American, PA-C Reason for referral : Chronic Care Management (Medication Management)   Corey [name] is a 64 y.o. year old male who is a primary care patient of Volney American, [place]. The CCM team was consulted for assistance with chronic disease management and care coordination needs.    Contacted patient for medication management review.   Review of patient status, including review of consultants reports, relevant laboratory and other test results, and collaboration with appropriate care team members and the patient's provider was performed as part of comprehensive patient evaluation and provision of chronic care management services.    SDOH (Social Determinants of Health) screening performed today: Armed forces logistics/support/administrative officer . See Care Plan for related entries.   Outpatient Encounter Medications as of 02/20/2019  Medication Sig Note  . Accu-Chek FastClix Lancets MISC USE TO CHECK BLOOD SUGAR   . ACCU-CHEK GUIDE test strip USE TO CHECK BLOOD SUGAR TID   . amitriptyline (ELAVIL) 50 MG tablet Take 1-2 tablets (50-100 mg total) by mouth at bedtime.   Marland Kitchen aspirin EC 81 MG tablet Take 81 mg by mouth daily.   Marland Kitchen buPROPion (WELLBUTRIN XL) 150 MG 24 hr tablet TAKE 1 TABLET(150 MG) BY MOUTH DAILY   . cetirizine (ZYRTEC) 10 MG tablet Take 1 tablet (10 mg total) by mouth daily.   . Cholecalciferol (VITAMIN D3) 5000 units TABS Take 5,000 Units by mouth daily.    . citalopram (CELEXA) 10 MG tablet TAKE 1 TABLET BY MOUTH  DAILY   . clopidogrel (PLAVIX) 75 MG tablet Take by mouth.   . Fluticasone-Salmeterol (ADVAIR) 500-50 MCG/DOSE AEPB Inhale 1 puff into the lungs 2 (two) times daily.   . furosemide (LASIX) 20 MG tablet TAKE 1 TABLET(20 MG) BY MOUTH TWICE DAILY   . GLYXAMBI 25-5 MG TABS TAKE 1 TABLET BY MOUTH IN  THE MORNING   . LANTUS SOLOSTAR 100  UNIT/ML Solostar Pen INJECT 75 UNITS [place] D. (Patient taking differently: 80 Units. INJECT 75 UNITS [place] D.) 01/18/2019: 85 units   . losartan (COZAAR) 50 MG tablet Take 50 mg by mouth daily.   . magnesium gluconate (MAGONATE) 500 MG tablet Take 500 mg by mouth daily.   . meloxicam (MOBIC) 7.5 MG tablet Take 1 tablet (7.5 mg total) by mouth daily.   . metFORMIN (GLUCOPHAGE) 1000 MG tablet TAKE 1 TABLET BY MOUTH  TWICE DAILY   . metoprolol succinate (TOPROL-XL) 25 MG 24 hr tablet Take 25 mg by mouth daily.   . montelukast (SINGULAIR) 10 MG tablet TAKE 1 TABLET(10 MG) BY MOUTH DAILY   . pantoprazole (PROTONIX) 40 MG tablet Take 40 mg by mouth daily.   . pregabalin (LYRICA) 100 MG capsule TAKE 1 CAPSULE(100 MG) BY MOUTH TWICE DAILY   . primidone (MYSOLINE) 50 MG tablet TAKE 2 TABLETS(100 MG) BY MOUTH DAILY   . rosuvastatin (CRESTOR) 20 MG tablet Take 20 mg by mouth daily.   . Semaglutide, 1 MG/DOSE, (OZEMPIC, 1 MG/DOSE,) 2 MG/1.5ML SOPN Inject 1 mg into the skin once a week.   . sucralfate (CARAFATE) 1 g tablet TAKE 1 TABLET(1 GRAM) BY MOUTH THREE TIMES DAILY AS NEEDED   . acetaminophen (TYLENOL) 500 MG tablet Take 1,000 mg by mouth every 6 (six) hours as needed for moderate pain or headache.   . albuterol (PROVENTIL) (2.5 MG/3ML) 0.083% nebulizer solution USE 1  VIAL IN NEBULIZER EVERY 6 HOURS - and as needed.   Marland Kitchen albuterol (VENTOLIN HFA) 108 (90 Base) MCG/ACT inhaler INHALE 2 PUFFS INTO THE LUNGS EVERY 6 HOURS AS NEEDED FOR WHEEZING OR SHORTNESS OF BREATH   . azelastine (ASTELIN) 0.1 % nasal spray Place 2 sprays into both nostrils 2 (two) times daily. Use in each nostril as directed   . B-D UF III MINI PEN NEEDLES 31G X 5 MM MISC USE TWICE DAILY   . Blood Glucose Monitoring Suppl (ACCU-CHEK GUIDE) w/Device KIT U UTD   . diclofenac sodium (VOLTAREN) 1 % GEL Apply 1 application topically 4 (four) times daily as needed (pain).    . Insulin Syringe-Needle U-100 30G X 1/2" 1 ML MISC 1 Units by Does not  apply route daily.   . Lancets Misc. (ACCU-CHEK FASTCLIX LANCET) KIT 1 Units by Does not apply route 2 (two) times daily.   Marland Kitchen nystatin cream (MYCOSTATIN) Apply 1 application topically 2 (two) times daily.   . [DISCONTINUED] lisinopril (ZESTRIL) 10 MG tablet TAKE 1 TABLET(10 MG) BY MOUTH DAILY 02/20/2019: D/c per Dr. Clayborn Bigness 08/02/2018  . [DISCONTINUED] primidone (MYSOLINE) 50 MG tablet TAKE 2 TABLETS(100 MG) BY MOUTH DAILY    No facility-administered encounter medications on file as of 02/20/2019.     Objective:   Goals Addressed            This Visit's Progress     Patient Stated   . PharmD "I want to take care of my sugars" (pt-stated)       Current Barriers:  . Diabetes: uncontrolled; most recent A1c 8.6%; follows w/ [name] o Received BID pill box that I mailed to him. He notes he has been using, and that this has significant helped him manage his medications and refills o Notes extensive dental work being done recently, he has had 4-5 teeth pulled already and may need more . Current antihyperglycemic regimen: metformin 1000 mg BID, Ozempic 1 mg weekly, Glyxambi 25/5 mg daily, Lantus 85 units daily, Humalog  . Current exercise: none, limited by chronic lower back pain. RN CM, clinical staff, and PCP are collaborating on mobility needs and DME scooter . Cardiovascular risk reduction (CAD s/p stent placement 08/16/2018 at [facility]) o Current hypertensive regimen: furosemide, losartan 50 mg daily, metoprolol succinate 25 mg daily; noted that lisinopril also appears on medication list.  o Current hyperlipidemia regimen: rosuvastatin 20 mg daily o Current antiplatelet regimen: ASA 81 mg daily, clopidogrel 75 mg daily (likely DAPT duration x 1 year) . GERD: switched from esomeprazole to pantoprazole previously by Merrie Roof; appears both have continued to be filled; also on sucralfate 1 g TID PRN . Depression/chronic pain: amitriptyline 50 mg daily, citalopram 10 mg daily,  primidone 100 mg daily, pregabalin 100 mg BID, bupropion XL 150, meloxicam 7.5 mg daily . Asthma/Allergic rhinitis: [place] BID, albuterol PRN, montelukast 10 mg daily, cetirizine 10 mg daily   Pharmacist Clinical Goal(s):  Marland Kitchen Over the next 90 days, patient with work with PharmD and primary care provider to address optimized glycemic management  Interventions: . Reviewed medication fill histories in comparison to prescribed list . Reviewed chart history. Appears patient was on lisinopril, but Dr. Clayborn Bigness intended him to switch to losartan on 08/02/2018 appointment.  . Contacted [facility]. Confirmed that patient filled lisinopril 10 mg 90 day supply on 12/29 (prescribed by Orene Desanctis), but had also filled losartan 50 mg 90 day supply on 01/11/2019.  Marland Kitchen Contacted [facility]. Confirmed w/ Dr. Etta Quill  nurse that patient is to be on losartan. Contacted [facility], asked that lisinopril prescription be canceled.  . Contacted patient, explained the above. He verbalized understanding.  Kandice Robinsons [facility], recommended they d/c esomeprazole prescription.  . Mailing patient an updated medication list  . Patient noted concerns affording food. Placing Care Guide referral.   Patient Self Care Activities:  . Patient will check blood glucose BID, document, and provide at future appointments . Patient will take medications as prescribed . Patient will report any questions or concerns to provider   Please see past updates related to this goal by clicking on the "Past Updates" button in the selected goal          Plan:  - Follow up call scheduled 04/10/19  Catie Darnelle Maffucci, PharmD, [place] [phone]

## 2019-02-21 NOTE — Telephone Encounter (Signed)
Clydie Braun with [employer].  States she has been trying for 10 days to get intouch with Synetta Fail.  There is a mistake on paperwork and she needs to speak with her directly instead of having paperwork sent back.

## 2019-02-21 NOTE — Telephone Encounter (Addendum)
Called Corey [name]. No answer. LVM for her to return my call.  Paperwork faxed over 02/20/19

## 2019-02-25 ENCOUNTER — Other Ambulatory Visit: Payer: Self-pay

## 2019-02-25 ENCOUNTER — Ambulatory Visit: Payer: Medicare Other | Admitting: Gastroenterology

## 2019-02-25 ENCOUNTER — Ambulatory Visit (INDEPENDENT_AMBULATORY_CARE_PROVIDER_SITE_OTHER): Payer: 59 | Admitting: Gastroenterology

## 2019-02-25 VITALS — BP 124/83 | HR 91 | Temp 98.1°F | Ht 66.0 in | Wt 221.0 lb

## 2019-02-25 DIAGNOSIS — K219 Gastro-esophageal reflux disease without esophagitis: Secondary | ICD-10-CM

## 2019-02-25 DIAGNOSIS — Z86010 Personal history of colonic polyps: Secondary | ICD-10-CM | POA: Diagnosis not present

## 2019-02-25 NOTE — Progress Notes (Signed)
Jonathon Bellows MD, MRCP(U.K) [address]  [place], [place] [address]  Main: [phone]  Fax: [phone]   Gastroenterology Consultation  Referring Provider:     Volney American,* Primary Care Physician:  Volney American, [place] Primary Gastroenterologist:  Dr. Jonathon Bellows  Reason for Consultation:     Acid reflux and colonoscopy        HPI:   Corey [name] is a 64 y.o. y/o male referred for consultation & management  by Dr. Orene Desanctis, Lilia Argue, PA-C.     Previously spoken to our office regarding colonoscopy and it was canceled in April 2020 due to COVID-19  He states that he has had acid reflux for 10 to 15 years.  Main symptoms are heartburn.  Was recently started on Prilosec 40 mg once a day which she takes before breakfast.  Since then he states that he has had minimal symptoms.  He has gained some weight recently.  Denies any family history of esophageal cancer.  Last EGD was many years back which he says was normal.  He had a colonoscopy over 10 years back and was found to have polyps and was asked to return in 5 years.  No family history of colon cancer or polyps.  He is on Plavix.  Past Medical History:  Diagnosis Date  . Asthma   . Diabetes ([place])   . Hyperlipidemia   . Hypertension   . Legg-Perthes disease     Past Surgical History:  Procedure Laterality Date  . Foot Surgery    . HIP SURGERY    . RIGHT/LEFT HEART CATH AND CORONARY ANGIOGRAPHY N/A 07/25/2018   Procedure: RIGHT/LEFT HEART CATH AND CORONARY ANGIOGRAPHY;  Surgeon: Yolonda Kida, MD;  Location: [facility];  Service: Cardiovascular;  Laterality: N/A;    Prior to Admission medications   Medication Sig Start Date End Date Taking? Authorizing Provider  Accu-Chek FastClix Lancets MISC USE TO CHECK BLOOD SUGAR 11/23/18   Volney American, [place]  ACCU-CHEK GUIDE test strip USE TO CHECK BLOOD SUGAR TID 08/02/17   Volney American, PA-C  acetaminophen  (TYLENOL) 500 MG tablet Take 1,000 mg by mouth every 6 (six) hours as needed for moderate pain or headache.    [provider]  albuterol (PROVENTIL) (2.5 MG/3ML) 0.083% nebulizer solution USE 1 VIAL IN NEBULIZER EVERY 6 HOURS - and as needed. 01/21/19   Volney American, PA-C  albuterol (VENTOLIN HFA) 108 (90 Base) MCG/ACT inhaler INHALE 2 PUFFS INTO THE LUNGS EVERY 6 HOURS AS NEEDED FOR WHEEZING OR SHORTNESS OF BREATH 11/29/18   Volney American, PA-C  amitriptyline (ELAVIL) 50 MG tablet Take 1-2 tablets (50-100 mg total) by mouth at bedtime. 07/25/18   Volney American, PA-C  aspirin EC 81 MG tablet Take 81 mg by mouth daily.    [provider]  azelastine (ASTELIN) 0.1 % nasal spray Place 2 sprays into both nostrils 2 (two) times daily. Use in each nostril as directed 07/25/18   Volney American, PA-C  B-D UF III MINI PEN NEEDLES 31G X 5 MM MISC USE TWICE DAILY 06/20/18   Volney American, PA-C  Blood Glucose Monitoring Suppl (ACCU-CHEK GUIDE) w/Device KIT U UTD 06/02/17   [provider]  buPROPion (WELLBUTRIN XL) 150 MG 24 hr tablet TAKE 1 TABLET(150 MG) BY MOUTH DAILY 01/16/19   Volney American, PA-C  cetirizine (ZYRTEC) 10 MG tablet Take 1 tablet (10 mg total) by  mouth daily. 01/21/19   Volney American, PA-C  Cholecalciferol (VITAMIN D3) 5000 units TABS Take 5,000 Units by mouth daily.     [provider]  citalopram (CELEXA) 10 MG tablet TAKE 1 TABLET BY MOUTH  DAILY 12/11/18   Volney American, PA-C  clopidogrel (PLAVIX) 75 MG tablet Take by mouth. 09/05/18 09/05/19  [provider]  diclofenac sodium (VOLTAREN) 1 % GEL Apply 1 application topically 4 (four) times daily as needed (pain).     [provider]  Fluticasone-Salmeterol (ADVAIR) 500-50 MCG/DOSE AEPB Inhale 1 puff into the lungs 2 (two) times daily. 04/30/18   Volney American, PA-C  furosemide (LASIX) 20 MG tablet TAKE 1 TABLET(20  MG) BY MOUTH TWICE DAILY 01/21/19   Volney American, PA-C  GLYXAMBI 25-5 MG TABS TAKE 1 TABLET BY MOUTH IN  THE MORNING 10/23/18   Volney American, PA-C  Insulin Syringe-Needle U-100 30G X 1/2" 1 ML MISC 1 Units by Does not apply route daily. 09/21/17   Volney American, PA-C  Lancets Misc. (ACCU-CHEK FASTCLIX LANCET) KIT 1 Units by Does not apply route 2 (two) times daily. 09/21/17   Volney American, PA-C  LANTUS SOLOSTAR 100 UNIT/ML Solostar Pen INJECT 75 UNITS [place] D. Patient taking differently: 80 Units. INJECT 75 UNITS  D. 07/25/18   Volney American, PA-C  losartan (COZAAR) 50 MG tablet Take 50 mg by mouth daily.    [provider]  magnesium gluconate (MAGONATE) 500 MG tablet Take 500 mg by mouth daily.    [provider]  meloxicam (MOBIC) 7.5 MG tablet Take 1 tablet (7.5 mg total) by mouth daily. 01/21/19   Volney American, PA-C  metFORMIN (GLUCOPHAGE) 1000 MG tablet TAKE 1 TABLET BY MOUTH  TWICE DAILY 12/11/18   Volney American, PA-C  metoprolol succinate (TOPROL-XL) 25 MG 24 hr tablet Take 25 mg by mouth daily.    [provider]  montelukast (SINGULAIR) 10 MG tablet TAKE 1 TABLET(10 MG) BY MOUTH DAILY 10/20/18   Weisenburger, Megan P, DO  nystatin cream (MYCOSTATIN) Apply 1 application topically 2 (two) times daily. 01/28/19   Volney American, PA-C  pantoprazole (PROTONIX) 40 MG tablet Take 40 mg by mouth daily.    [provider]  pregabalin (LYRICA) 100 MG capsule TAKE 1 CAPSULE(100 MG) BY MOUTH TWICE DAILY 02/04/19   Volney American, PA-C  primidone (MYSOLINE) 50 MG tablet TAKE 2 TABLETS(100 MG) BY MOUTH DAILY 02/20/19   Volney American, PA-C  rosuvastatin (CRESTOR) 20 MG tablet Take 20 mg by mouth daily.    [provider]  Semaglutide, 1 MG/DOSE, (OZEMPIC, 1 MG/DOSE,) 2 MG/1.5ML SOPN Inject 1 mg into the skin once a week.    [provider]  sucralfate (CARAFATE) 1 g tablet  TAKE 1 TABLET(1 GRAM) BY MOUTH THREE TIMES DAILY AS NEEDED 02/06/19   Volney American, PA-C    Family History  Problem Relation Age of Onset  . Cancer Mother   . Heart disease Mother   . Diabetes Father      Social History   Tobacco Use  . Smoking status: Former Smoker    Packs/day: 0.50    Types: Cigarettes  . Smokeless tobacco: Current User    Types: Chew  . Tobacco comment: quit 40 years ago   Substance Use Topics  . Alcohol use: Not Currently  . Drug use: Never    Allergies as of 02/25/2019  . (No Known  Allergies)    Review of Systems:    All systems reviewed and negative except where noted in HPI.   Physical Exam:  There were no vitals taken for this visit. No LMP for male patient. Psych:  Alert and cooperative. Normal mood and affect. General:   Alert,  Well-developed, well-nourished, pleasant and cooperative in NAD Head:  Normocephalic and atraumatic. Eyes:  Sclera clear, no icterus.   Conjunctiva pink. Ears:  Normal auditory acuity. Lungs:  Respirations even and unlabored.  Clear throughout to auscultation.   No wheezes, crackles, or rhonchi. No acute distress. Heart:  Regular rate and rhythm; no murmurs, clicks, rubs, or gallops. Abdomen:  Normal bowel sounds.  No bruits.  Soft, non-tender and non-distended without masses, hepatosplenomegaly or hernias noted.  No guarding or rebound tenderness.    Extremities:  No clubbing or edema.  No cyanosis. Neurologic:  Alert and oriented x3;  grossly normal neurologically. Psych:  Alert and cooperative. Normal mood and affect.  Imaging Studies: No results found.  Assessment and Plan:   COVEY BALLER is a 64 y.o. y/o male has been referred for GERD and colon cancer screening.  Plan 1. GERD : Counseled on life style changes, suggest to use PPI first thing in the morning on empty stomach and eat 30 minutes after. Advised on the use of a wedge pillow at night , avoid meals for 2 hours prior to bed time. Weight  loss .Discussed the risks and benefits of long term PPI use including but not limited to bone loss, chronic kidney disease, infections , low magnesium . Aim to use at the lowest dose for the shortest period of time   We will screen for Barrett's esophagus  2.  Colon cancer screening surveillance due to prior history of colon polyps   Follow up in 3 months to decrease the dose of PPI  Dr Jonathon Bellows MD,MRCP(U.K)

## 2019-02-25 NOTE — Telephone Encounter (Signed)
[place], CMA spoke with Clydie Braun about paperwork

## 2019-02-25 NOTE — Patient Instructions (Signed)

## 2019-02-26 ENCOUNTER — Ambulatory Visit: Payer: Self-pay | Admitting: Pharmacist

## 2019-02-26 NOTE — Chronic Care Management (AMB) (Signed)
  Chronic Care Management   Note  02/26/2019 Name: Corey [name] MRN: [id number] DOB: 1955/12/03  Corey [name] is a 64 y.o. year old male who is a primary care patient of Particia Nearing, [place]. The CCM team was consulted for assistance with chronic disease management and care coordination needs.   Received call from patient today. He was inquiring when he was going to hear from a Care Guide about food resources. He notes that he does have food at home for tonight and tomorrow. Will collaborate w/ Care Guide for resources.   Catie Feliz Beam, PharmD, [facility] Clinical Pharmacist [facility] Practice[facility] [phone]

## 2019-02-27 ENCOUNTER — Other Ambulatory Visit: Payer: Self-pay | Admitting: Family Medicine

## 2019-03-04 ENCOUNTER — Encounter: Payer: Self-pay | Admitting: Student in an Organized Health Care Education/Training Program

## 2019-03-05 ENCOUNTER — Other Ambulatory Visit: Payer: Self-pay

## 2019-03-05 ENCOUNTER — Ambulatory Visit
Payer: 59 | Attending: Student in an Organized Health Care Education/Training Program | Admitting: Student in an Organized Health Care Education/Training Program

## 2019-03-05 ENCOUNTER — Encounter: Payer: Self-pay | Admitting: Student in an Organized Health Care Education/Training Program

## 2019-03-05 DIAGNOSIS — M5441 Lumbago with sciatica, right side: Secondary | ICD-10-CM

## 2019-03-05 DIAGNOSIS — M5136 Other intervertebral disc degeneration, lumbar region: Secondary | ICD-10-CM | POA: Diagnosis not present

## 2019-03-05 DIAGNOSIS — E1141 Type 2 diabetes mellitus with diabetic mononeuropathy: Secondary | ICD-10-CM

## 2019-03-05 DIAGNOSIS — M5442 Lumbago with sciatica, left side: Secondary | ICD-10-CM | POA: Diagnosis not present

## 2019-03-05 DIAGNOSIS — M533 Sacrococcygeal disorders, not elsewhere classified: Secondary | ICD-10-CM

## 2019-03-05 DIAGNOSIS — G8929 Other chronic pain: Secondary | ICD-10-CM

## 2019-03-05 MED ORDER — TIZANIDINE HCL 4 MG PO TABS: 4.0000 mg | ORAL_TABLET | Freq: Two times a day (BID) | ORAL | 5 refills | Status: AC | PRN

## 2019-03-05 MED ORDER — PREGABALIN 150 MG PO CAPS: 150.0000 mg | ORAL_CAPSULE | Freq: Two times a day (BID) | ORAL | 5 refills | Status: DC

## 2019-03-05 NOTE — Progress Notes (Signed)
Patient: Corey [name]  Service Category: E/M  Provider: Edward Jolly, MD  DOB: 1955-05-06  DOS: 03/05/2019  Location: Office  MRN: [id number]  Setting: Ambulatory outpatient  Referring Provider: Particia Nearing,*  Type: Established Patient  Specialty: Interventional Pain Management  PCP: Particia Nearing, PA-C  Location: Home  Delivery: TeleHealth     Virtual Encounter - Pain Management PROVIDER NOTE: Information contained herein reflects review and annotations entered in association with encounter. Interpretation of such information and data should be left to medically-trained personnel. Information provided to patient can be located elsewhere in the medical record under "Patient Instructions". Document created using STT-dictation technology, any transcriptional errors that may result from process are unintentional.    Contact & Pharmacy Preferred: [phone] Home: [phone] (home) Mobile: [phone] (mobile) E-mail: [id number] [place], [place] - [address] AT [facility] [address] [place] [address] Phone: [phone] Fax: [phone]  [facility] - [place], [place] - [address] [facility] [address] [place] [address] [place] [address] Phone: [phone] Fax: [phone]  [facility] - [place], [place] - [address] [employer] [address] [place] [address] Phone: [phone] Fax: [phone]   Pre-screening  Mr. Chapple offered "in-person" vs "virtual" encounter. He indicated preferring virtual for this encounter.   Reason COVID-19*  Social distancing based on CDC and AMA recommendations.   I contacted Corey [name] on 03/05/2019 via telephone.      I clearly identified myself as Edward Jolly, MD. I verified that I was speaking with the correct person using two identifiers (Name: Corey [name], and date of  birth: [birth year]).  This visit was completed via telephone due to the restrictions of the COVID-19 pandemic. All issues as above were discussed and addressed but no physical exam was performed. If it was felt that the patient should be evaluated in the office, they were directed there. The patient verbally consented to this visit. Patient was unable to complete an audio/visual visit due to Technical difficulties and/or Lack of internet. Due to the catastrophic nature of the COVID-19 pandemic, this visit was done through audio contact only.  Location of the patient: home address (see Epic for details)  Location of the provider: office  Consent I sought verbal advanced consent from Corey [name] for virtual visit interactions. I informed [name] of possible security and privacy concerns, risks, and limitations associated with providing "not-in-person" medical evaluation and management services. I also informed Mr. Kattner of the availability of "in-person" appointments. Finally, I informed him that there would be a charge for the virtual visit and that he could be  personally, fully or partially, financially responsible for it. [name] expressed understanding and agreed to proceed.   Historic Elements   Corey [name] is a 64 y.o. year old, male patient evaluated today after his last encounter by our practice on 02/03/2019. [name]  has a past medical history of Asthma, Diabetes (HCC), Hyperlipidemia, Hypertension, and Legg-Perthes disease. He also  has a past surgical history that includes Hip surgery; Foot Surgery; and RIGHT/LEFT HEART CATH AND CORONARY ANGIOGRAPHY (N/A, 07/25/2018). [name] has a current medication list which includes the following prescription(s): accu-chek fastclix lancets, accu-chek guide, acetaminophen, albuterol, albuterol, amitriptyline, aspirin ec, azelastine, b-d uf iii mini pen needles, accu-chek guide, bupropion, cetirizine, vitamin d3, citalopram,  clopidogrel, diclofenac sodium, fluticasone-salmeterol, furosemide, glyxambi, insulin  syringe-needle u-100, accu-chek fastclix lancet, lantus solostar, losartan, magnesium gluconate, meloxicam, metformin, metoprolol succinate, montelukast, nystatin cream, pantoprazole, primidone, rosuvastatin, ozempic (1 mg/dose), sucralfate, pregabalin, and tizanidine. He  reports that he has quit smoking. His smoking use included cigarettes. He smoked 0.50 packs per day. His smokeless tobacco use includes chew. He reports previous alcohol use. He reports that he does not use drugs. [name] has No Known Allergies.   HPI  Today, he is being contacted for medication management and to review his imaging studies.  Since his last visit no significant changes in his medical history.  He continues to endorse low back and proximal buttock pain.  This is worse with standing.  He also endorses bilateral knee pain.  Patient is a type II diabetic on insulin and also on Plavix for cardiac disease.  He does find benefit with Lyrica at 100 mg twice a day.  No side effects with this medication.  His kidney function appears normal.  No lower extremity swelling.  Discussed increasing dose to 150 mg twice daily.  Patient is also finding benefit with tizanidine so we will refill that.  Laboratory Chemistry Profile (12 mo)  Renal: 12/24/2018: BUN 14; BUN/Creatinine Ratio 14; Creatinine, Ser 1.02  Lab Results  Component Value Date   GFRAA 91 12/24/2018   GFRNONAA 79 12/24/2018   Hepatic: 12/24/2018: Albumin 4.4 Lab Results  Component Value Date   AST 30 12/24/2018   ALT 34 12/24/2018   Other: No results found for requested labs within last 8760 hours.  Note: Above Lab results reviewed.  Imaging  DG Sacroiliac Joint X-Rays CLINICAL DATA:  Chronic bilateral hip pain.  EXAM: BILATERAL SACROILIAC JOINTS - 3+ VIEW  COMPARISON:  None.  FINDINGS: The sacroiliac joint spaces are maintained and there is no evidence of  arthropathy. Previously noted bilateral femoral head deformity suggesting old, healed slipped capital femoral epiphyses.  IMPRESSION: 1. Normal appearing sacroiliac joints. 2. 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  Electronically Signed   By: Beckie Salts M.D.   On: 12/07/2018 13:33 DG Lumbar Spine Complete w/ Flex/Ext (6 Views) CLINICAL DATA:  Chronic low back pain.  EXAM: LUMBAR SPINE - COMPLETE WITH BENDING VIEWS  COMPARISON:  None.  FINDINGS: Five non-rib-bearing lumbar vertebrae. Moderate to large-sized right lateral spurs at the L2-3 level. Mild moderate anterior spur formation at the T12-L1 and L2-3 levels. Minimal anterior spur formation at the L4-5 level. No fractures, pars defects or subluxations, including flexion-extension views. Large amount of stool in the right and transverse colon.  IMPRESSION: 1. Degenerative changes, as described above. 2. Prominent stool.  Electronically Signed   By: Beckie Salts M.D.   On: 12/07/2018 13:30 DG Knee (Left) CLINICAL DATA:  Chronic bilateral knee pain.  EXAM: LEFT KNEE - 1-2 VIEW  COMPARISON:  None.  FINDINGS: Minimal medial and posterior patellar spur formation. Otherwise, normal appearing bones and soft tissues. No effusion seen.  IMPRESSION: Minimal medial and patellofemoral degenerative changes.  Electronically Signed   By: Beckie Salts M.D.   On: 12/07/2018 13:28 DG Knee (Right) CLINICAL DATA:  Chronic bilateral hip pain.  EXAM: RIGHT KNEE - 1-2 VIEW  COMPARISON:  None.  FINDINGS: Minimal posterior patellar spur formation. Otherwise, normal appearing bones and soft tissues. No effusion.  IMPRESSION: Minimal patellofemoral degenerative changes.  Electronically Signed   By: Beckie Salts M.D.   On: 12/07/2018 13:27 DG Hip (Left) CLINICAL DATA:  Chronic low back and bilateral hip pain.  EXAM: DG  HIP (WITH OR WITHOUT PELVIS) 2-3V LEFT  COMPARISON:   None.  FINDINGS: Deformity of both femoral heads with an appearance suggesting old, healed bilateral slipped capital femoral epiphyses. No fracture or dislocation seen. Diffuse osteopenia.  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 No acute abnormality.  Electronically Signed   By: Claudie Revering M.D.   On: 12/07/2018 13:26 DG Hip (Right) CLINICAL DATA:  Right hip pain.  Arthralgia.  EXAM: DG HIP (WITH OR WITHOUT PELVIS) 2-3V RIGHT  COMPARISON:  No prior.  FINDINGS: Degenerative changes lumbar spine and both hips. Deformity noted about both hips and proximal femurs, most likely developmental. Corticated bony density noted adjacent to the right hip possibly related to old injury. No acute bony abnormality identified. No evidence of fracture or dislocation. Pelvic calcifications consistent with phleboliths.  IMPRESSION: Degenerative changes lumbar spine and both hips. Deformity noted about both hips and proximal femurs, most likely developmental. No acute bony or joint abnormality identified.  Electronically Signed   By: Marcello Moores  Register   On: 12/07/2018 13:24  Reviewed with patient  Assessment  The primary encounter diagnosis was Chronic bilateral low back pain with bilateral sciatica. Diagnoses of Lumbar degenerative disc disease, Diabetic mononeuropathy associated with type 2 diabetes mellitus ([place]), and Sacroiliac joint pain were also pertinent to this visit.  Plan of Care   I have discontinued Jadrien L. Kampf's pregabalin. I am also having him start on pregabalin and tiZANidine. Additionally, I am having him maintain his magnesium gluconate, Vitamin D3, aspirin EC, Accu-Chek Guide, Accu-Chek Guide, Accu-Chek FastClix Lancet, Insulin Syringe-Needle U-100, diclofenac sodium, Fluticasone-Salmeterol, B-D UF III MINI PEN NEEDLES, acetaminophen, Lantus SoloStar, amitriptyline, montelukast, Glyxambi, clopidogrel, Accu-Chek FastClix  Lancets, albuterol, losartan, metoprolol succinate, pantoprazole, Ozempic (1 MG/DOSE), rosuvastatin, metFORMIN, citalopram, buPROPion, albuterol, meloxicam, cetirizine, furosemide, nystatin cream, sucralfate, primidone, and azelastine.  1. Increase Lyrica to 150 mg BID, Cr wnl 2.  Refill Zanaflex below 3. Avoid steroid based therapies (Type II DM on insulin). Consider IA hyalgan and or genicular NB with LA only for bilateral knee pain   Pharmacotherapy (Medications Ordered): Meds ordered this encounter  Medications  . pregabalin (LYRICA) 150 MG capsule    Sig: Take 1 capsule (150 mg total) by mouth 2 (two) times daily.    Dispense:  60 capsule    Refill:  5    Fill one day early if pharmacy is closed on scheduled refill date. May substitute for generic if available.  Marland Kitchen tiZANidine (ZANAFLEX) 4 MG tablet    Sig: Take 1 tablet (4 mg total) by mouth every 12 (twelve) hours as needed for muscle spasms.    Dispense:  60 tablet    Refill:  5    Do not place this medication, or any other prescription from our practice, on "Automatic Refill". Patient may have prescription filled one day early if pharmacy is closed on scheduled refill date.    Follow-up plan:   Return in about 6 months (around 09/02/2019) for Medication Management.    Recent Visits Date Type Provider Dept  12/06/18 Office Visit Gillis Santa, MD [facility]  Showing recent visits within past 90 days and meeting all other requirements   Today's Visits Date Type Provider Dept  03/05/19 Office Visit Gillis Santa, MD [facility]  Showing today's visits and meeting all other requirements   Future Appointments No visits were found meeting these conditions.  Showing future appointments within next 90 days and meeting all other requirements   I  discussed the assessment and treatment plan with the patient. The patient was provided an opportunity to ask questions and all were answered. The patient agreed with  the plan and demonstrated an understanding of the instructions.  Patient advised to call back or seek an in-person evaluation if the symptoms or condition worsens.  Duration of encounter: 30 minutes.  Note by: Edward Jolly, MD Date: 03/05/2019; Time: 11:11 AM

## 2019-03-08 ENCOUNTER — Ambulatory Visit: Payer: Self-pay | Admitting: Pharmacist

## 2019-03-08 DIAGNOSIS — G8929 Other chronic pain: Secondary | ICD-10-CM

## 2019-03-08 DIAGNOSIS — E1165 Type 2 diabetes mellitus with hyperglycemia: Secondary | ICD-10-CM

## 2019-03-08 DIAGNOSIS — Z794 Long term (current) use of insulin: Secondary | ICD-10-CM

## 2019-03-08 NOTE — Chronic Care Management (AMB) (Signed)
Chronic Care Management   Follow Up Note   03/08/2019 Name: JOMES GIRALDO MRN: [id number] DOB: 12/28/1955  Referred by: Volney American, PA-C Reason for referral : Chronic Care Management (Medication Mangement)   REICE BIENVENUE is a 64 y.o. year old male who is a primary care patient of Volney American, [place]. The CCM team was consulted for assistance with chronic disease management and care coordination needs.   Received call from patient today.   Review of patient status, including review of consultants reports, relevant laboratory and other test results, and collaboration with appropriate care team members and the patient's provider was performed as part of comprehensive patient evaluation and provision of chronic care management services.    SDOH (Social Determinants of Health) screening performed today: Financial Strain . See Care Plan for related entries.   Outpatient Encounter Medications as of 03/08/2019  Medication Sig Note  . Accu-Chek FastClix Lancets MISC USE TO CHECK BLOOD SUGAR   . ACCU-CHEK GUIDE test strip USE TO CHECK BLOOD SUGAR TID   . acetaminophen (TYLENOL) 500 MG tablet Take 1,000 mg by mouth every 6 (six) hours as needed for moderate pain or headache.   . albuterol (PROVENTIL) (2.5 MG/3ML) 0.083% nebulizer solution USE 1 VIAL IN NEBULIZER EVERY 6 HOURS - and as needed.   Marland Kitchen albuterol (VENTOLIN HFA) 108 (90 Base) MCG/ACT inhaler INHALE 2 PUFFS INTO THE LUNGS EVERY 6 HOURS AS NEEDED FOR WHEEZING OR SHORTNESS OF BREATH   . amitriptyline (ELAVIL) 50 MG tablet Take 1-2 tablets (50-100 mg total) by mouth at bedtime.   Marland Kitchen aspirin EC 81 MG tablet Take 81 mg by mouth daily.   Marland Kitchen azelastine (ASTELIN) 0.1 % nasal spray USE 2 SPRAYS IN EACH NOSTRIL TWICE DAILY   . B-D UF III MINI PEN NEEDLES 31G X 5 MM MISC USE TWICE DAILY   . Blood Glucose Monitoring Suppl (ACCU-CHEK GUIDE) w/Device KIT U UTD   . buPROPion (WELLBUTRIN XL) 150 MG 24 hr tablet TAKE 1 TABLET(150 MG)  BY MOUTH DAILY   . cetirizine (ZYRTEC) 10 MG tablet Take 1 tablet (10 mg total) by mouth daily.   . Cholecalciferol (VITAMIN D3) 5000 units TABS Take 5,000 Units by mouth daily.    . citalopram (CELEXA) 10 MG tablet TAKE 1 TABLET BY MOUTH  DAILY   . clopidogrel (PLAVIX) 75 MG tablet Take by mouth.   . diclofenac sodium (VOLTAREN) 1 % GEL Apply 1 application topically 4 (four) times daily as needed (pain).    . Fluticasone-Salmeterol (ADVAIR) 500-50 MCG/DOSE AEPB Inhale 1 puff into the lungs 2 (two) times daily.   . furosemide (LASIX) 20 MG tablet TAKE 1 TABLET(20 MG) BY MOUTH TWICE DAILY   . GLYXAMBI 25-5 MG TABS TAKE 1 TABLET BY MOUTH IN  THE MORNING   . Insulin Syringe-Needle U-100 30G X 1/2" 1 ML MISC 1 Units by Does not apply route daily.   . Lancets Misc. (ACCU-CHEK FASTCLIX LANCET) KIT 1 Units by Does not apply route 2 (two) times daily.   Marland Kitchen LANTUS SOLOSTAR 100 UNIT/ML Solostar Pen INJECT 75 UNITS [place] D. (Patient taking differently: 80 Units. INJECT 75 UNITS [place] D.) 01/18/2019: 85 units   . losartan (COZAAR) 50 MG tablet Take 50 mg by mouth daily.   . magnesium gluconate (MAGONATE) 500 MG tablet Take 500 mg by mouth daily.   . meloxicam (MOBIC) 7.5 MG tablet Take 1 tablet (7.5 mg total) by mouth daily.   . metFORMIN (GLUCOPHAGE) 1000  MG tablet TAKE 1 TABLET BY MOUTH  TWICE DAILY   . metoprolol succinate (TOPROL-XL) 25 MG 24 hr tablet Take 25 mg by mouth daily.   . montelukast (SINGULAIR) 10 MG tablet TAKE 1 TABLET(10 MG) BY MOUTH DAILY   . nystatin cream (MYCOSTATIN) Apply 1 application topically 2 (two) times daily.   . pantoprazole (PROTONIX) 40 MG tablet Take 40 mg by mouth daily.   . pregabalin (LYRICA) 150 MG capsule Take 1 capsule (150 mg total) by mouth 2 (two) times daily.   . primidone (MYSOLINE) 50 MG tablet TAKE 2 TABLETS(100 MG) BY MOUTH DAILY   . rosuvastatin (CRESTOR) 20 MG tablet Take 20 mg by mouth daily.   . Semaglutide, 1 MG/DOSE, (OZEMPIC, 1 MG/DOSE,) 2 MG/1.5ML SOPN  Inject 1 mg into the skin once a week.   . sucralfate (CARAFATE) 1 g tablet TAKE 1 TABLET(1 GRAM) BY MOUTH THREE TIMES DAILY AS NEEDED   . tiZANidine (ZANAFLEX) 4 MG tablet Take 1 tablet (4 mg total) by mouth every 12 (twelve) hours as needed for muscle spasms.    No facility-administered encounter medications on file as of 03/08/2019.     Objective:   Goals Addressed            This Visit's Progress     Patient Stated   . PharmD "I want to take care of my sugars" (pt-stated)       Current Barriers:  . Diabetes: uncontrolled; most recent A1c 8.6%; follows w/ [name] o Patient calls me asking about getting a "government phone" . Current antihyperglycemic regimen: metformin 1000 mg BID, Ozempic 1 mg weekly, Glyxambi 25/5 mg daily, Lantus 85 units daily, Humalog  . Current exercise: none, limited by chronic lower back pain. RN CM, clinical staff, and PCP are collaborating on mobility needs and DME scooter . Cardiovascular risk reduction (CAD s/p stent placement 08/16/2018 at [facility]) o Current hypertensive regimen: furosemide, losartan 50 mg daily, metoprolol succinate 25 mg daily; noted that lisinopril also appears on medication list.  o Current hyperlipidemia regimen: rosuvastatin 20 mg daily o Current antiplatelet regimen: ASA 81 mg daily, clopidogrel 75 mg daily (likely DAPT duration x 1 year) . GERD: switched from esomeprazole to pantoprazole previously by Merrie Roof; appears both have continued to be filled; also on sucralfate 1 g TID PRN . Depression/chronic pain: amitriptyline 50 mg daily, citalopram 10 mg daily, primidone 100 mg daily, pregabalin 100 mg BID, bupropion XL 150, meloxicam 7.5 mg daily . Asthma/Allergic rhinitis: [place] BID, albuterol PRN, montelukast 10 mg daily, cetirizine 10 mg daily   Pharmacist Clinical Goal(s):  Marland Kitchen Over the next 90 days, patient with work with PharmD and primary care provider to address optimized glycemic  management  Interventions: . Will place Care Guide referral and collaborate w/ LCSW regarding patient's concerns with his phone bill. He frequently has been unable to pay the bill, resulting in he and his wife being unable to communicate with his healthcare team. He notes he submitted an application twice to a group, but he wasn't sure of the name.   Patient Self Care Activities:  . Patient will check blood glucose BID, document, and provide at future appointments . Patient will take medications as prescribed . Patient will report any questions or concerns to provider   Please see past updates related to this goal by clicking on the "Past Updates" button in the selected goal          Plan:  - Will outreach  patient as previously scheduled  Catie Darnelle Maffucci, PharmD, [place] [phone]

## 2019-03-08 NOTE — Patient Instructions (Signed)
Visit Information  Goals Addressed            This Visit's Progress     Patient Stated   . PharmD "I want to take care of my sugars" (pt-stated)       Current Barriers:  . Diabetes: uncontrolled; most recent A1c 8.6%; follows w/ [name] o Patient calls me asking about getting a "government phone" . Current antihyperglycemic regimen: metformin 1000 mg BID, Ozempic 1 mg weekly, Glyxambi 25/5 mg daily, Lantus 85 units daily, Humalog  . Current exercise: none, limited by chronic lower back pain. RN CM, clinical staff, and PCP are collaborating on mobility needs and DME scooter . Cardiovascular risk reduction (CAD s/p stent placement [facility]/16/2018 at [facility]) o Current hypertensive regimen: furosemide, losartan 50 mg daily, metoprolol succinate 25 mg daily; noted that lisinopril also appears on medication list.  o Current hyperlipidemia regimen: rosuvastatin 20 mg daily o Current antiplatelet regimen: ASA 81 mg daily, clopidogrel 75 mg daily (likely DAPT duration x 1 year) . GERD: switched from esomeprazole to pantoprazole previously by Roosvelt Maser; appears both have continued to be filled; also on sucralfate 1 g TID PRN . Depression/chronic pain: amitriptyline 50 mg daily, citalopram 10 mg daily, primidone 100 mg daily, pregabalin 100 mg BID, bupropion XL 150, meloxicam 7.5 mg daily . Asthma/Allergic rhinitis: [place] BID, albuterol PRN, montelukast 10 mg daily, cetirizine 10 mg daily   Pharmacist Clinical Goal(s):  Marland Kitchen Over the next 90 days, patient with work with PharmD and primary care provider to address optimized glycemic management  Interventions: . Will place Care Guide referral and collaborate w/ LCSW regarding patient's concerns with his phone bill. He frequently has been unable to pay the bill, resulting in he and his wife being unable to communicate with his healthcare team. He notes he submitted an application twice to a group, but he wasn't sure of the name.   Patient  Self Care Activities:  . Patient will check blood glucose BID, document, and provide at future appointments . Patient will take medications as prescribed . Patient will report any questions or concerns to provider   Please see past updates related to this goal by clicking on the "Past Updates" button in the selected goal         The patient verbalized understanding of instructions provided today and declined a print copy of patient instruction materials.   Plan:  - Will outreach patient as previously scheduled  Catie Feliz Beam, PharmD, [facility] Clinical Pharmacist [facility] [phone]

## 2019-03-13 ENCOUNTER — Ambulatory Visit (INDEPENDENT_AMBULATORY_CARE_PROVIDER_SITE_OTHER): Payer: 59 | Admitting: General Practice

## 2019-03-13 ENCOUNTER — Telehealth: Payer: Self-pay | Admitting: General Practice

## 2019-03-13 DIAGNOSIS — E1165 Type 2 diabetes mellitus with hyperglycemia: Secondary | ICD-10-CM | POA: Diagnosis not present

## 2019-03-13 DIAGNOSIS — E782 Mixed hyperlipidemia: Secondary | ICD-10-CM

## 2019-03-13 DIAGNOSIS — I10 Essential (primary) hypertension: Secondary | ICD-10-CM

## 2019-03-13 DIAGNOSIS — Z794 Long term (current) use of insulin: Secondary | ICD-10-CM

## 2019-03-13 NOTE — Patient Instructions (Signed)
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 and education needs related to asthma, HTN, Angina, DMII and Chronic back pain related to degenerative disc and bilateral sciatica and the need for a scooter to maintain mobility and independence   Nurse Case Manager Clinical Goal(s):   Over the next 90 days, patient will work with [facility]  to address needs related to Managing multiple chronic disease processes   Interventions:   Collaborated with PCP clinical staff  regarding patient's need for office visit to be evaluated specifically for POV/Scooter - the patient states that they called him yesterday and he should get his scooter in about 2 weeks  All paperwork faxed including office notes to Otila Back at [employer] division. Fax # [phone] (done 01/2019)  Patient Self Care Activities:   Patient verbalizes understanding of plan to obtain POV  Unable to perform IADLs independently  Please see past updates related to this goal by clicking on the "Past Updates" button in the selected goal       RNCM:"My sugar was up this am because I ate grapes" (pt-stated)       Current Barriers:   Knowledge Deficits related to negative impact on health and well being in a patient with chronic disease processes and not following a prescribed Heart healthy/ADA diet  Financial Constraints.   Transportation barriers  Nurse Case Manager Clinical Goal(s):   Over the next 90 days, patient will verbalize understanding of plan for following a Heart healthy/ADA diet  Over the next 90 days, patient will work with [place], pcp and CCM team to address needs related to dietary restrictions and following a heart healthy/ADA diet  Over the next 90 days, patient will demonstrate a decrease in hyperglycemic exacerbations as evidenced by  decreased blood sugar readings and decrease hemoglobin A1C levels  Over the next 90 days, patient will demonstrate improved health management independence as evidenced bymaking healthy food choice, watching hidden salts and sugars, continuing to monitor blood sugars  Over the next 90 days, patient will work with CM team pharmacist to monitor medications   Over the next 90 days, patient will work with CM clinical social worker to assist with resources as needed  Over the next 90 days, patient will work with care guides to get adequate transportation  Interventions:   Evaluation of current treatment plan related to DM and HTN dietary restrictions and patient's adherence to plan as established by provider.  Advised patient to monitor his dietary intake   Provided education to patient re: to following a Heart Healthy/ADA diet  Collaborated with pcp, CCM team regarding management of ADA  Discussed plans with patient for ongoing care management follow up and provided patient with direct contact information for care management team  Advised patient, providing education and rationale, to monitor blood pressure daily and record, calling pcp for findings outside established parameters.   Advised patient, providing education and rationale, to check cbg bid and record, calling pcp for findings outside established parameters.    Care Guide referral for transportation/phone  Patient Self Care Activities:   Patient verbalizes understanding of plan to monitor health and  well being by following prescribed diet for chronic health conditions  Attends all scheduled provider appointments  Calls provider office for new concerns or  questions  Unable to independently manage chronic medical conditions  Does not adhere to provider recommendations re: heart healthy/ADA  Initial goal documentation        The patient verbalized understanding of instructions provided today and declined a print copy  of patient instruction materials.   Telephone follow up appointment with care management team member scheduled for: 05-08-2018 at 3 pm  Alto Denver RN, MSN, CCM Community Care Coordinator [place]   [facility] Mobile: [phone]

## 2019-03-13 NOTE — Chronic Care Management (AMB) (Signed)
Chronic Care Management   Follow Up Note   03/13/2019 Name: AKARI CRYSLER MRN: [id number] DOB: 08/08/1955  Referred by: Volney American, PA-C Reason for referral : Chronic Care Management (Follow up: Scooter/DM2/HTN/HLD)   Lenna Sciara is a 64 y.o. year old male who is a primary care patient of Volney American, [place]. The CCM team was consulted for assistance with chronic disease management and care coordination needs.    Review of patient status, including review of consultants reports, relevant laboratory and other test results, and collaboration with appropriate care team members and the patient's provider was performed as part of comprehensive patient evaluation and provision of chronic care management services.    SDOH (Social Determinants of Health) screening performed today: Biomedical engineer  Food Insecurity  Tobacco Use Physical Activity. See Care Plan for related entries.   Outpatient Encounter Medications as of 03/13/2019  Medication Sig Note  . Accu-Chek FastClix Lancets MISC USE TO CHECK BLOOD SUGAR   . ACCU-CHEK GUIDE test strip USE TO CHECK BLOOD SUGAR TID   . acetaminophen (TYLENOL) 500 MG tablet Take 1,000 mg by mouth every 6 (six) hours as needed for moderate pain or headache.   . albuterol (PROVENTIL) (2.5 MG/3ML) 0.083% nebulizer solution USE 1 VIAL IN NEBULIZER EVERY 6 HOURS - and as needed.   Marland Kitchen albuterol (VENTOLIN HFA) 108 (90 Base) MCG/ACT inhaler INHALE 2 PUFFS INTO THE LUNGS EVERY 6 HOURS AS NEEDED FOR WHEEZING OR SHORTNESS OF BREATH   . amitriptyline (ELAVIL) 50 MG tablet Take 1-2 tablets (50-100 mg total) by mouth at bedtime.   Marland Kitchen aspirin EC 81 MG tablet Take 81 mg by mouth daily.   Marland Kitchen azelastine (ASTELIN) 0.1 % nasal spray USE 2 SPRAYS IN EACH NOSTRIL TWICE DAILY   . B-D UF III MINI PEN NEEDLES 31G X 5 MM MISC USE TWICE DAILY   . Blood Glucose Monitoring Suppl (ACCU-CHEK GUIDE) w/Device KIT U UTD   . buPROPion  (WELLBUTRIN XL) 150 MG 24 hr tablet TAKE 1 TABLET(150 MG) BY MOUTH DAILY   . cetirizine (ZYRTEC) 10 MG tablet Take 1 tablet (10 mg total) by mouth daily.   . Cholecalciferol (VITAMIN D3) 5000 units TABS Take 5,000 Units by mouth daily.    . citalopram (CELEXA) 10 MG tablet TAKE 1 TABLET BY MOUTH  DAILY   . clopidogrel (PLAVIX) 75 MG tablet Take by mouth.   . diclofenac sodium (VOLTAREN) 1 % GEL Apply 1 application topically 4 (four) times daily as needed (pain).    . Fluticasone-Salmeterol (ADVAIR) 500-50 MCG/DOSE AEPB Inhale 1 puff into the lungs 2 (two) times daily.   . furosemide (LASIX) 20 MG tablet TAKE 1 TABLET(20 MG) BY MOUTH TWICE DAILY   . GLYXAMBI 25-5 MG TABS TAKE 1 TABLET BY MOUTH IN  THE MORNING   . Insulin Syringe-Needle U-100 30G X 1/2" 1 ML MISC 1 Units by Does not apply route daily.   . Lancets Misc. (ACCU-CHEK FASTCLIX LANCET) KIT 1 Units by Does not apply route 2 (two) times daily.   Marland Kitchen LANTUS SOLOSTAR 100 UNIT/ML Solostar Pen INJECT 75 UNITS [place] D. (Patient taking differently: 80 Units. INJECT 75 UNITS [place] D.) 01/18/2019: 85 units   . losartan (COZAAR) 50 MG tablet Take 50 mg by mouth daily.   . magnesium gluconate (MAGONATE) 500 MG tablet Take 500 mg by mouth daily.   . meloxicam (MOBIC) 7.5 MG tablet Take 1 tablet (7.5 mg total) by mouth daily.   Marland Kitchen  metFORMIN (GLUCOPHAGE) 1000 MG tablet TAKE 1 TABLET BY MOUTH  TWICE DAILY   . metoprolol succinate (TOPROL-XL) 25 MG 24 hr tablet Take 25 mg by mouth daily.   . montelukast (SINGULAIR) 10 MG tablet TAKE 1 TABLET(10 MG) BY MOUTH DAILY   . nystatin cream (MYCOSTATIN) Apply 1 application topically 2 (two) times daily.   . pantoprazole (PROTONIX) 40 MG tablet Take 40 mg by mouth daily.   . pregabalin (LYRICA) 150 MG capsule Take 1 capsule (150 mg total) by mouth 2 (two) times daily.   . primidone (MYSOLINE) 50 MG tablet TAKE 2 TABLETS(100 MG) BY MOUTH DAILY   . rosuvastatin (CRESTOR) 20 MG tablet Take 20 mg by mouth daily.   .  Semaglutide, 1 MG/DOSE, (OZEMPIC, 1 MG/DOSE,) 2 MG/1.5ML SOPN Inject 1 mg into the skin once a week.   . sucralfate (CARAFATE) 1 g tablet TAKE 1 TABLET(1 GRAM) BY MOUTH THREE TIMES DAILY AS NEEDED   . tiZANidine (ZANAFLEX) 4 MG tablet Take 1 tablet (4 mg total) by mouth every 12 (twelve) hours as needed for muscle spasms.    No facility-administered encounter medications on file as of 03/13/2019.     Objective:  Lab Results  Component Value Date   HGBA1C 9.7 (H) 03/02/2018    Goals Addressed            This Visit's Progress   . RN-I have several health problems (pt-stated)       Current Barriers:  . Film/video editor.  . Chronic Disease Management support and education needs related to asthma, HTN, Angina, DMII and Chronic back pain related to degenerative disc and bilateral sciatica and the need for a scooter to maintain mobility and independence   Nurse Case Manager Clinical Goal(s):  Marland Kitchen Over the next 90 days, patient will work with [facility]  to address needs related to Managing multiple chronic disease processes   Interventions:  . Collaborated with PCP clinical staff  regarding patient's need for office visit to be evaluated specifically for POV/Scooter - the patient states that they called him yesterday and he should get his scooter in about 2 weeks . All paperwork faxed including office notes to Otila Back at [facility] division. Fax # [phone] (done 01/2019)  Patient Self Care Activities:  . Patient verbalizes understanding of plan to obtain POV . Unable to perform IADLs independently  Please see past updates related to this goal by clicking on the "Past Updates" button in the selected goal      . RNCM:"My sugar was up this am because I ate grapes" (pt-stated)       Current Barriers:  Marland Kitchen Knowledge Deficits related to negative impact on health and well being in a patient with chronic disease processes and not following a prescribed Heart healthy/ADA  diet . Film/video editor.  . Transportation barriers  Nurse Case Manager Clinical Goal(s):  Marland Kitchen Over the next 90 days, patient will verbalize understanding of plan for following a Heart healthy/ADA diet . Over the next 90 days, patient will work with [place], pcp and CCM team to address needs related to dietary restrictions and following a heart healthy/ADA diet . Over the next 90 days, patient will demonstrate a decrease in hyperglycemic exacerbations as evidenced by decreased blood sugar readings and decrease hemoglobin A1C levels . Over the next 90 days, patient will demonstrate improved health management independence as evidenced bymaking healthy food choice, watching hidden salts and sugars, continuing to monitor blood sugars . Over the next  90 days, patient will work with CM team pharmacist to monitor medications  . Over the next 90 days, patient will work with CM clinical social worker to assist with resources as needed . Over the next 90 days, patient will work with care guides to get adequate transportation  Interventions:  . Evaluation of current treatment plan related to DM and HTN dietary restrictions and patient's adherence to plan as established by provider. . Advised patient to monitor his dietary intake  . Provided education to patient re: to following a Heart Healthy/ADA diet . Collaborated with pcp, CCM team regarding management of ADA . Discussed plans with patient for ongoing care management follow up and provided patient with direct contact information for care management team . Advised patient, providing education and rationale, to monitor blood pressure daily and record, calling pcp for findings outside established parameters.  . Advised patient, providing education and rationale, to check cbg bid and record, calling pcp for findings outside established parameters.   . Care Guide referral for transportation/phone  Patient Self Care Activities:  . Patient verbalizes  understanding of plan to monitor health and  well being by following prescribed diet for chronic health conditions . Attends all scheduled provider appointments . Calls provider office for new concerns or questions . Unable to independently manage chronic medical conditions . Does not adhere to provider recommendations re: heart healthy/ADA  Initial goal documentation         Plan:   Telephone follow up appointment with care management team member scheduled for: 05-08-2019 at 3 pm   [place], MSN, [facility] Mobile: [phone]

## 2019-03-18 LAB — HEMOGLOBIN A1C: Hemoglobin A1C: 9.3

## 2019-03-26 ENCOUNTER — Ambulatory Visit: Payer: Self-pay | Admitting: General Practice

## 2019-03-26 DIAGNOSIS — Z794 Long term (current) use of insulin: Secondary | ICD-10-CM | POA: Diagnosis not present

## 2019-03-26 DIAGNOSIS — R531 Weakness: Secondary | ICD-10-CM

## 2019-03-26 DIAGNOSIS — I10 Essential (primary) hypertension: Secondary | ICD-10-CM | POA: Diagnosis not present

## 2019-03-26 DIAGNOSIS — M25531 Pain in right wrist: Secondary | ICD-10-CM

## 2019-03-26 DIAGNOSIS — G894 Chronic pain syndrome: Secondary | ICD-10-CM

## 2019-03-26 DIAGNOSIS — E782 Mixed hyperlipidemia: Secondary | ICD-10-CM | POA: Diagnosis not present

## 2019-03-26 DIAGNOSIS — G8929 Other chronic pain: Secondary | ICD-10-CM

## 2019-03-26 DIAGNOSIS — E1165 Type 2 diabetes mellitus with hyperglycemia: Secondary | ICD-10-CM | POA: Diagnosis not present

## 2019-03-26 DIAGNOSIS — M25572 Pain in left ankle and joints of left foot: Secondary | ICD-10-CM

## 2019-03-26 NOTE — Chronic Care Management (AMB) (Signed)
Chronic Care Management   Follow Up Note   03/26/2019 Name: Corey [name] MRN: [id number] DOB: 1955-08-16  Referred by: Volney American, PA-C Reason for referral : Chronic Care Management (Patient had left VM about hand and leg brace and ask for a call back)   Corey [name] is a 64 y.o. year old male who is a primary care patient of Volney American, [place]. The CCM team was consulted for assistance with chronic disease management and care coordination needs.    Review of patient status, including review of consultants reports, relevant laboratory and other test results, and collaboration with appropriate care team members and the patient's provider was performed as part of comprehensive patient evaluation and provision of chronic care management services.    SDOH (Social Determinants of Health) assessments performed: No    Outpatient Encounter Medications as of 03/26/2019  Medication Sig Note  . Accu-Chek FastClix Lancets MISC USE TO CHECK BLOOD SUGAR   . ACCU-CHEK GUIDE test strip USE TO CHECK BLOOD SUGAR TID   . acetaminophen (TYLENOL) 500 MG tablet Take 1,000 mg by mouth every 6 (six) hours as needed for moderate pain or headache.   . albuterol (PROVENTIL) (2.5 MG/3ML) 0.083% nebulizer solution USE 1 VIAL IN NEBULIZER EVERY 6 HOURS - and as needed.   Marland Kitchen albuterol (VENTOLIN HFA) 108 (90 Base) MCG/ACT inhaler INHALE 2 PUFFS INTO THE LUNGS EVERY 6 HOURS AS NEEDED FOR WHEEZING OR SHORTNESS OF BREATH   . amitriptyline (ELAVIL) 50 MG tablet Take 1-2 tablets (50-100 mg total) by mouth at bedtime.   Marland Kitchen aspirin EC 81 MG tablet Take 81 mg by mouth daily.   Marland Kitchen azelastine (ASTELIN) 0.1 % nasal spray USE 2 SPRAYS IN EACH NOSTRIL TWICE DAILY   . B-D UF III MINI PEN NEEDLES 31G X 5 MM MISC USE TWICE DAILY   . Blood Glucose Monitoring Suppl (ACCU-CHEK GUIDE) w/Device KIT U UTD   . buPROPion (WELLBUTRIN XL) 150 MG 24 hr tablet TAKE 1 TABLET(150 MG) BY MOUTH DAILY   . cetirizine  (ZYRTEC) 10 MG tablet Take 1 tablet (10 mg total) by mouth daily.   . Cholecalciferol (VITAMIN D3) 5000 units TABS Take 5,000 Units by mouth daily.    . citalopram (CELEXA) 10 MG tablet TAKE 1 TABLET BY MOUTH  DAILY   . clopidogrel (PLAVIX) 75 MG tablet Take by mouth.   . diclofenac sodium (VOLTAREN) 1 % GEL Apply 1 application topically 4 (four) times daily as needed (pain).    . Fluticasone-Salmeterol (ADVAIR) 500-50 MCG/DOSE AEPB Inhale 1 puff into the lungs 2 (two) times daily.   . furosemide (LASIX) 20 MG tablet TAKE 1 TABLET(20 MG) BY MOUTH TWICE DAILY   . GLYXAMBI 25-5 MG TABS TAKE 1 TABLET BY MOUTH IN  THE MORNING   . Insulin Syringe-Needle U-100 30G X 1/2" 1 ML MISC 1 Units by Does not apply route daily.   . Lancets Misc. (ACCU-CHEK FASTCLIX LANCET) KIT 1 Units by Does not apply route 2 (two) times daily.   Marland Kitchen LANTUS SOLOSTAR 100 UNIT/ML Solostar Pen INJECT 75 UNITS [place] D. (Patient taking differently: 80 Units. INJECT 75 UNITS  D.) 01/18/2019: 85 units   . losartan (COZAAR) 50 MG tablet Take 50 mg by mouth daily.   . magnesium gluconate (MAGONATE) 500 MG tablet Take 500 mg by mouth daily.   . meloxicam (MOBIC) 7.5 MG tablet Take 1 tablet (7.5 mg total) by mouth daily.   . metFORMIN (GLUCOPHAGE) 1000 MG  tablet TAKE 1 TABLET BY MOUTH  TWICE DAILY   . metoprolol succinate (TOPROL-XL) 25 MG 24 hr tablet Take 25 mg by mouth daily.   . montelukast (SINGULAIR) 10 MG tablet TAKE 1 TABLET(10 MG) BY MOUTH DAILY   . nystatin cream (MYCOSTATIN) Apply 1 application topically 2 (two) times daily.   . pantoprazole (PROTONIX) 40 MG tablet Take 40 mg by mouth daily.   . pregabalin (LYRICA) 150 MG capsule Take 1 capsule (150 mg total) by mouth 2 (two) times daily.   . primidone (MYSOLINE) 50 MG tablet TAKE 2 TABLETS(100 MG) BY MOUTH DAILY   . rosuvastatin (CRESTOR) 20 MG tablet Take 20 mg by mouth daily.   . Semaglutide, 1 MG/DOSE, (OZEMPIC, 1 MG/DOSE,) 2 MG/1.5ML SOPN Inject 1 mg into the skin once a  week.   . sucralfate (CARAFATE) 1 g tablet TAKE 1 TABLET(1 GRAM) BY MOUTH THREE TIMES DAILY AS NEEDED   . tiZANidine (ZANAFLEX) 4 MG tablet Take 1 tablet (4 mg total) by mouth every 12 (twelve) hours as needed for muscle spasms.    No facility-administered encounter medications on file as of 03/26/2019.     Objective:   Goals Addressed            This Visit's Progress   . RN-I have several health problems (pt-stated)       Current Barriers:  . Film/video editor.  . Chronic Disease Management support and education needs related to asthma, HTN, Angina, DMII and Chronic back pain related to degenerative disc and bilateral sciatica and the need for a scooter to maintain mobility and independence   Nurse Case Manager Clinical Goal(s):  Marland Kitchen Over the next 90 days, patient will work with [facility]  to address needs related to Managing multiple chronic disease processes   Interventions:  . Collaborated with PCP clinical staff  regarding patient's need for office visit to be evaluated specifically for POV/Scooter - the patient states that he has the scooter now and it is working well for him . All paperwork faxed including office notes to Otila Back at [facility] division. Fax # [phone] (done 01/2019) . Evaluate the patients needs for new request of wrist braces and ankle braces to help with the "arthritis" pain he is having- the patient states he has not discussed this with the pcp or pain clinic . Encouraged the patient to call his insurance company to find out about cost and also to utilize the OTC benefit catalog for standard braces.   . Advised the patient that he should speak to the pcp or pain clinic MD for recommendations on what to use to help with the arthritis pain in his ankles and hands.  . Educated on calling the provider for changes in level, intensity, or unresolved pain  Patient Self Care Activities:  . Patient verbalizes understanding of plan to obtain braces for  hands/wrist and ankles . Unable to perform IADLs independently  Please see past updates related to this goal by clicking on the "Past Updates" button in the selected goal          Plan:   Telephone follow up appointment with care management team member scheduled for: 05/08/2019   Noreene Larsson RN, MSN, [facility] Mobile: [phone]

## 2019-03-26 NOTE — Patient Instructions (Signed)
Visit Information  Goals Addressed            This Visit's Progress   . RN-I have several health problems (pt-stated)       Current Barriers:  . Film/video editor.  . Chronic Disease Management support and education needs related to asthma, HTN, Angina, DMII and Chronic back pain related to degenerative disc and bilateral sciatica and the need for a scooter to maintain mobility and independence   Nurse Case Manager Clinical Goal(s):  Marland Kitchen Over the next 90 days, patient will work with [facility]  to address needs related to Managing multiple chronic disease processes   Interventions:  . Collaborated with PCP clinical staff  regarding patient's need for office visit to be evaluated specifically for POV/Scooter - the patient states that he has the scooter now and it is working well for him . All paperwork faxed including office notes to Otila Back at [facility] division. Fax # [phone] (done 01/2019) . Evaluate the patients needs for new request of wrist braces and ankle braces to help with the "arthritis" pain he is having- the patient states he has not discussed this with the pcp or pain clinic . Encouraged the patient to call his insurance company to find out about cost and also to utilize the OTC benefit catalog for standard braces.   . Advised the patient that he should speak to the pcp or pain clinic MD for recommendations on what to use to help with the arthritis pain in his ankles and hands.  . Educated on calling the provider for changes in level, intensity, or unresolved pain  Patient Self Care Activities:  . Patient verbalizes understanding of plan to obtain braces for hands/wrist and ankles . Unable to perform IADLs independently  Please see past updates related to this goal by clicking on the "Past Updates" button in the selected goal         Patient verbalizes understanding of instructions provided today.   Telephone follow up appointment with care management  team member scheduled for: 05-08-2019  Noreene Larsson RN, MSN, [facility] Mobile: [phone]  Arthritis Arthritis means joint pain. It can also mean joint disease. A joint is a place where bones come together. There are more than 100 types of arthritis. What are the causes? This condition may be caused by:  Wear and tear of a joint. This is the most common cause.  A lot of acid in the blood, which leads to pain in the joint (gout).  Pain and swelling (inflammation) in a joint.  Infection of a joint.  Injuries in the joint.  A reaction to medicines (allergy). In some cases, the cause may not be known. What are the signs or symptoms? Symptoms of this condition include:  Redness at a joint.  Swelling at a joint.  Stiffness at a joint.  Warmth coming from the joint.  A fever.  A feeling of being sick. How is this treated? This condition may be treated with:  Treating the cause, if it is known.  Rest.  Raising (elevating) the joint.  Putting cold or hot packs on the joint.  Medicines to treat symptoms and reduce pain and swelling.  Shots of medicines (cortisone) into the joint. You may also be told to make changes in your life, such as doing exercises and losing weight. Follow these instructions at home: Medicines  Take over-the-counter and prescription medicines only as told by  your doctor.  Do not take aspirin for pain if your doctor says that you may have gout. Activity  Rest your joint if your doctor tells you to.  Avoid activities that make the pain worse.  Exercise your joint regularly as told by your doctor. Try doing exercises like: ? Swimming. ? Water aerobics. ? Biking. ? Walking. Managing pain, stiffness, and swelling      If told, put ice on the affected area. ? Put ice in a plastic bag. ? Place a towel between your skin and the bag. ? Leave the ice on  for 20 minutes, 2-3 times per day.  If your joint is swollen, raise (elevate) it above the level of your heart if told by your doctor.  If your joint feels stiff in the morning, try taking a warm shower.  If told, put heat on the affected area. Do this as often as told by your doctor. Use the heat source that your doctor recommends, such as a moist heat pack or a heating pad. If you have diabetes, do not apply heat without asking your doctor. To apply heat: ? Place a towel between your skin and the heat source. ? Leave the heat on for 20-30 minutes. ? Remove the heat if your skin turns bright red. This is very important if you are unable to feel pain, heat, or cold. You may have a greater risk of getting burned. General instructions  Do not use any products that contain nicotine or tobacco, such as cigarettes, e-cigarettes, and chewing tobacco. If you need help quitting, ask your doctor.  Keep all follow-up visits as told by your doctor. This is important. Contact a doctor if:  The pain gets worse.  You have a fever. Get help right away if:  You have very bad pain in your joint.  You have swelling in your joint.  Your joint is red.  Many joints become painful and swollen.  You have very bad back pain.  Your leg is very weak.  You cannot control your pee (urine) or poop (stool). Summary  Arthritis means joint pain. It can also mean joint disease. A joint is a place where bones come together.  The most common cause of this condition is wear and tear of a joint.  Symptoms of this condition include redness, swelling, or stiffness of the joint.  This condition is treated with rest, raising the joint, medicines, and putting cold or hot packs on the joint.  Follow your doctor's instructions about medicines, activity, exercises, and other home care treatments. This information is not intended to replace advice given to you by your health care provider. Make sure you discuss any  questions you have with your health care provider. Document Revised: 12/25/2017 Document Reviewed: 12/25/2017 Elsevier Patient Education  2020 [employer].

## 2019-03-27 ENCOUNTER — Ambulatory Visit: Payer: Self-pay | Admitting: Pharmacist

## 2019-03-27 NOTE — Chronic Care Management (AMB) (Signed)
  Chronic Care Management   Note  03/27/2019 Name: Corey [name] MRN: [id number] DOB: [birth year]  Corey [name] is a 64 y.o. year old male who is a primary care patient of Particia Nearing, [place]. The CCM team was consulted for assistance with chronic disease management and care coordination needs.    Received voicemail from patient today asking me to call him, but no specific needs noted. Called patient back, left voicemail asking to return my call.  Catie Feliz Beam, PharmD, [facility] Clinical Pharmacist [facility] [phone]

## 2019-03-28 ENCOUNTER — Other Ambulatory Visit: Payer: Self-pay

## 2019-03-28 DIAGNOSIS — K219 Gastro-esophageal reflux disease without esophagitis: Secondary | ICD-10-CM

## 2019-03-28 DIAGNOSIS — Z860100 Personal history of colon polyps, unspecified: Secondary | ICD-10-CM

## 2019-03-28 MED ORDER — NA SULFATE-K SULFATE-MG SULF 17.5-3.13-1.6 GM/177ML PO SOLN: 1.0000 | Freq: Once | ORAL | 0 refills | Status: AC

## 2019-04-01 ENCOUNTER — Other Ambulatory Visit: Payer: Self-pay | Admitting: Family Medicine

## 2019-04-01 NOTE — Telephone Encounter (Signed)
Requested Prescriptions  Pending Prescriptions Disp Refills  . albuterol (PROVENTIL) (2.5 MG/3ML) 0.083% nebulizer solution [Pharmacy Med Name: albuterol sulfate 2.5 mg/3 mL (0.083 %) solution for nebulization] 75 mL 11    Sig: USE 1 VIAL IN NEBULIZER EVERY 6 HOURS - and as needed.     Pulmonology:  Beta Agonists Failed - 04/01/2019  5:09 PM      Failed - One inhaler should last at least one month. If the patient is requesting refills earlier, contact the patient to check for uncontrolled symptoms.      Passed - Valid encounter within last 12 months    Recent Outpatient Visits          2 months ago Chronic bilateral low back pain with bilateral sciatica   [facility] Particia Nearing, [place]   3 months ago Type 2 diabetes mellitus with hyperglycemia, with long-term current use of insulin [facility])   [facility] [place], [place]   4 months ago Pain, dental   [facility] Particia Nearing, [place]   7 months ago Essential hypertension   [facility] [place], [place]ge Gainer, [place]   9 months ago Insulin dependent diabetes mellitus [facility])   [facility] Particia Nearing, [place]      Future Appointments            In 1 month Wyline Mood, MD [facility] [place]   In 2 months Maurice March, Salley Hews, PA-C [employer], [facility]

## 2019-04-03 ENCOUNTER — Ambulatory Visit (INDEPENDENT_AMBULATORY_CARE_PROVIDER_SITE_OTHER): Payer: 59 | Admitting: Pharmacist

## 2019-04-03 DIAGNOSIS — Z794 Long term (current) use of insulin: Secondary | ICD-10-CM

## 2019-04-03 DIAGNOSIS — I10 Essential (primary) hypertension: Secondary | ICD-10-CM

## 2019-04-03 DIAGNOSIS — E1165 Type 2 diabetes mellitus with hyperglycemia: Secondary | ICD-10-CM

## 2019-04-03 NOTE — Chronic Care Management (AMB) (Signed)
Chronic Care Management   Follow Up Note   04/03/2019 Name: Corey [name] MRN: [id number] DOB: [birth year]  Referred by: Volney American, PA-C Reason for referral : Chronic Care Management (Medication Management)   Corey [name] is a 64 y.o. year old male who is a primary care patient of Volney American, [place]. The CCM team was consulted for assistance with chronic disease management and care coordination needs.  Contacted patient for medication management review.     Review of patient status, including review of consultants reports, relevant laboratory and other test results, and collaboration with appropriate care team members and the patient's provider was performed as part of comprehensive patient evaluation and provision of chronic care management services.    SDOH (Social Determinants of Health) assessments performed: Yes See Care Plan activities for detailed interventions related to [facility])     Outpatient Encounter Medications as of 04/03/2019  Medication Sig Note  . Accu-Chek FastClix Lancets MISC USE TO CHECK BLOOD SUGAR   . ACCU-CHEK GUIDE test strip USE TO CHECK BLOOD SUGAR TID   . albuterol (PROVENTIL) (2.5 MG/3ML) 0.083% nebulizer solution USE 1 VIAL IN NEBULIZER EVERY 6 HOURS - and as needed. 04/03/2019: Using BID-TID  . albuterol (VENTOLIN HFA) 108 (90 Base) MCG/ACT inhaler INHALE 2 PUFFS INTO THE LUNGS EVERY 6 HOURS AS NEEDED FOR WHEEZING OR SHORTNESS OF BREATH   . amitriptyline (ELAVIL) 50 MG tablet Take 1-2 tablets (50-100 mg total) by mouth at bedtime.   Marland Kitchen aspirin EC 81 MG tablet Take 81 mg by mouth daily.   Marland Kitchen azelastine (ASTELIN) 0.1 % nasal spray USE 2 SPRAYS IN EACH NOSTRIL TWICE DAILY   . B-D UF III MINI PEN NEEDLES 31G X 5 MM MISC USE TWICE DAILY   . Blood Glucose Monitoring Suppl (ACCU-CHEK GUIDE) w/Device KIT U UTD   . buPROPion (WELLBUTRIN XL) 150 MG 24 hr tablet TAKE 1 TABLET(150 MG) BY MOUTH DAILY   . cetirizine (ZYRTEC) 10 MG tablet Take 1  tablet (10 mg total) by mouth daily.   . Cholecalciferol (VITAMIN D3) 5000 units TABS Take 5,000 Units by mouth daily.    . citalopram (CELEXA) 10 MG tablet TAKE 1 TABLET BY MOUTH  DAILY   . clopidogrel (PLAVIX) 75 MG tablet Take by mouth.   . Fluticasone-Salmeterol (ADVAIR) 500-50 MCG/DOSE AEPB Inhale 1 puff into the lungs 2 (two) times daily.   . furosemide (LASIX) 20 MG tablet TAKE 1 TABLET(20 MG) BY MOUTH TWICE DAILY   . Insulin Syringe-Needle U-100 30G X 1/2" 1 ML MISC 1 Units by Does not apply route daily.   . isosorbide mononitrate (IMDUR) 60 MG 24 hr tablet Take 60 mg by mouth daily.   Marland Kitchen JARDIANCE 25 MG TABS tablet Take 25 mg by mouth daily.   . Lancets Misc. (ACCU-CHEK FASTCLIX LANCET) KIT 1 Units by Does not apply route 2 (two) times daily.   Marland Kitchen LANTUS SOLOSTAR 100 UNIT/ML Solostar Pen INJECT 75 UNITS [place] D. (Patient taking differently: 90 Units. INJECT 75 UNITS [place] D.) 01/18/2019: 85 units   . losartan (COZAAR) 50 MG tablet Take 50 mg by mouth daily.   . magnesium gluconate (MAGONATE) 500 MG tablet Take 500 mg by mouth daily.   . meloxicam (MOBIC) 7.5 MG tablet Take 1 tablet (7.5 mg total) by mouth daily.   . metFORMIN (GLUCOPHAGE) 1000 MG tablet TAKE 1 TABLET BY MOUTH  TWICE DAILY   . metoprolol succinate (TOPROL-XL) 25 MG 24 hr tablet Take 25  mg by mouth daily.   . montelukast (SINGULAIR) 10 MG tablet TAKE 1 TABLET(10 MG) BY MOUTH DAILY   . pantoprazole (PROTONIX) 40 MG tablet Take 40 mg by mouth daily.   . pregabalin (LYRICA) 150 MG capsule Take 1 capsule (150 mg total) by mouth 2 (two) times daily.   . primidone (MYSOLINE) 50 MG tablet TAKE 2 TABLETS(100 MG) BY MOUTH DAILY   . rosuvastatin (CRESTOR) 20 MG tablet Take 20 mg by mouth daily.   . Semaglutide, 1 MG/DOSE, (OZEMPIC, 1 MG/DOSE,) 2 MG/1.5ML SOPN Inject 1 mg into the skin once a week.   Marland Kitchen tiZANidine (ZANAFLEX) 4 MG tablet Take 1 tablet (4 mg total) by mouth every 12 (twelve) hours as needed for muscle spasms. 04/03/2019:  Usually using QPm  . acetaminophen (TYLENOL) 500 MG tablet Take 1,000 mg by mouth every 6 (six) hours as needed for moderate pain or headache.   . diclofenac sodium (VOLTAREN) 1 % GEL Apply 1 application topically 4 (four) times daily as needed (pain).    . nystatin cream (MYCOSTATIN) Apply 1 application topically 2 (two) times daily.   . sucralfate (CARAFATE) 1 g tablet TAKE 1 TABLET(1 GRAM) BY MOUTH THREE TIMES DAILY AS NEEDED   . [DISCONTINUED] GLYXAMBI 25-5 MG TABS TAKE 1 TABLET BY MOUTH IN  THE MORNING    No facility-administered encounter medications on file as of 04/03/2019.     Objective:   Goals Addressed            This Visit's Progress     Patient Stated   . PharmD "I want to take care of my sugars" (pt-stated)       CARE PLAN ENTRY (see longtitudinal plan of care for additional care plan information)  Current Barriers:  . Diabetes: uncontrolled; most recent A1c 9.3%; follows w/ [name] . Current antihyperglycemic regimen: metformin 1000 mg BID, Ozempic 1 mg weekly, Jardiance 25 mg daily, Lantus 90 units - at last visit, DPP4 component of Glyxambi d/c Jardiance alone started since GLP1 is at max dose . Current meal patterns: o Notes that he generally skips breakfast, occasionally eats lunch, but generally eats supper o Last night, had pork, potato salad, and green beans o Diet drinks o Occasional snacks, including peanuts and potato chips . Current exercise: none, limited by chronic lower back pain. RN CM, clinical staff, and PCP are collaborating on mobility needs and DME scooter . Cardiovascular risk reduction (CAD s/p stent placement 08/16/2018 at [facility]); follows w/ Dr. Clayborn Bigness o Current hypertensive regimen: furosemide 20 mg BID, losartan 50 mg daily, metoprolol succinate 25 mg daily; metoprolol succinate 25 mg daily, isosorbide 60 mg daily; not checking BP at home o Current hyperlipidemia regimen: rosuvastatin 20 mg daily; last LDL well controlled  at goal <70 o Current antiplatelet regimen: ASA 81 mg daily, clopidogrel 75 mg daily (likely DAPT duration x 1 year) . GERD: pantoprazole 40 mg daily; also on sucralfate 1 g TID PRN, but notes he hasn't needed to use this lately  . Depression/chronic pain: amitriptyline 50 mg QPM, citalopram 10 mg daily, primidone 100 mg daily, pregabalin 150 mg BID, bupropion XL 150, meloxicam 7.5 mg daily - notes no need for PRN APAP lately.  . Asthma/Allergic rhinitis: [place] BID, albuterol PRN - notes use of albuterol nebulizer more frequently lately, sometimes up to TID, montelukast 10 mg daily, cetirizine 10 mg daily   Pharmacist Clinical Goal(s):  Marland Kitchen Over the next 90 days, patient with work with PharmD and  primary care provider to address optimized glycemic management  Interventions: . Comprehensive medication review performed, medication list updated in electronic medical record. Cross referenced with recent fill history and [facility] documentation. Patient knowledgeable of recent medication dose changes, current medication purposes and administration instructions . Will collaborate w/ RN CM for continued dietary counseling support. Physical activity would be beneficial to glucose control, but this is limited by chronic pain . Discussed home BP monitoring. Patient notes that he does have a BP cuff, so he can start checking at home. Discussed goal SBP <140, possibly <130 if safely attainable . Discussed goal A1c, fasting glucose, 2 hour post prandial glucose.   Patient Self Care Activities:  . Patient will check blood glucose BID, document, and provide at future appointments . Patient will take medications as prescribed . Patient will report any questions or concerns to provider   Please see past updates related to this goal by clicking on the "Past Updates" button in the selected goal          Plan:  - Scheduled f/u call 06/04/19  Catie Darnelle Maffucci, PharmD, [place] [phone]

## 2019-04-03 NOTE — Patient Instructions (Addendum)
Visit Information  Goals Addressed            This Visit's Progress     Patient Stated   . PharmD "I want to take care of my sugars" (pt-stated)       CARE PLAN ENTRY (see longtitudinal plan of care for additional care plan information)  Current Barriers:  . Diabetes: uncontrolled; most recent A1c 9.3%; follows w/ [name] . Current antihyperglycemic regimen: metformin 1000 mg BID, Ozempic 1 mg weekly, Jardiance 25 mg daily, Lantus 90 units - at last visit, DPP4 component of Glyxambi d/c Jardiance alone started since GLP1 is at max dose . Current meal patterns: o Notes that he generally skips breakfast, occasionally eats lunch, but generally eats supper o Last night, had pork, potato salad, and green beans o Diet drinks o Occasional snacks, including peanuts and potato chips . Current exercise: none, limited by chronic lower back pain. RN CM, clinical staff, and PCP are collaborating on mobility needs and DME scooter . Cardiovascular risk reduction (CAD s/p stent placement 08/16/2018 at [facility]); follows w/ Dr. Juliann Pares o Current hypertensive regimen: furosemide 20 mg BID, losartan 50 mg daily, metoprolol succinate 25 mg daily; metoprolol succinate 25 mg daily, isosorbide 60 mg daily; not checking BP at home o Current hyperlipidemia regimen: rosuvastatin 20 mg daily; last LDL well controlled at goal <70 o Current antiplatelet regimen: ASA 81 mg daily, clopidogrel 75 mg daily (likely DAPT duration x 1 year) . GERD: pantoprazole 40 mg daily; also on sucralfate 1 g TID PRN, but notes he hasn't needed to use this lately  . Depression/chronic pain: amitriptyline 50 mg QPM, citalopram 10 mg daily, primidone 100 mg daily, pregabalin 150 mg BID, bupropion XL 150, meloxicam 7.5 mg daily - notes no need for PRN APAP lately.  . Asthma/Allergic rhinitis: Wixela BID, albuterol PRN - notes use of albuterol nebulizer more frequently lately, sometimes up to TID, montelukast 10 mg daily,  cetirizine 10 mg daily   Pharmacist Clinical Goal(s):  Marland Kitchen Over the next 90 days, patient with work with PharmD and primary care provider to address optimized glycemic management  Interventions: . Comprehensive medication review performed, medication list updated in electronic medical record. Cross referenced with recent fill history and [facility] documentation. Patient knowledgeable of recent medication dose changes, current medication purposes and administration instructions . Will collaborate w/ RN CM for continued dietary counseling support. Physical activity would be beneficial to glucose control, but this is limited by chronic pain . Discussed home BP monitoring. Patient notes that he does have a BP cuff, so he can start checking at home. Discussed goal SBP <140, possibly <130 if safely attainable . Discussed goal A1c, fasting glucose, 2 hour post prandial glucose.   Patient Self Care Activities:  . Patient will check blood glucose BID, document, and provide at future appointments . Patient will take medications as prescribed . Patient will report any questions or concerns to provider   Please see past updates related to this goal by clicking on the "Past Updates" button in the selected goal         Patient verbalizes understanding of instructions provided today.   Plan:  - Scheduled f/u call 06/04/19  Catie Feliz Beam, PharmD, [facility] Clinical Pharmacist [facility] [phone]

## 2019-04-07 ENCOUNTER — Other Ambulatory Visit: Payer: Self-pay | Admitting: Family Medicine

## 2019-04-12 ENCOUNTER — Other Ambulatory Visit: Payer: Self-pay

## 2019-04-12 ENCOUNTER — Other Ambulatory Visit
Admission: RE | Admit: 2019-04-12 | Discharge: 2019-04-12 | Disposition: A | Discharge status: Home / Self Care | Payer: 59 | Source: Ambulatory Visit | Attending: Gastroenterology | Admitting: Gastroenterology

## 2019-04-12 DIAGNOSIS — Z20822 Contact with and (suspected) exposure to covid-19: Secondary | ICD-10-CM | POA: Insufficient documentation

## 2019-04-12 DIAGNOSIS — Z01812 Encounter for preprocedural laboratory examination: Secondary | ICD-10-CM | POA: Insufficient documentation

## 2019-04-12 LAB — SARS CORONAVIRUS 2 (TAT 6-24 HRS): SARS Coronavirus 2: NEGATIVE

## 2019-04-16 ENCOUNTER — Ambulatory Visit: Payer: 59 | Admitting: Anesthesiology

## 2019-04-16 ENCOUNTER — Other Ambulatory Visit: Payer: Self-pay | Admitting: Family Medicine

## 2019-04-16 ENCOUNTER — Encounter: Payer: Self-pay | Admitting: Gastroenterology

## 2019-04-16 ENCOUNTER — Encounter
Admission: RE | Disposition: A | Discharge status: Home / Self Care | Payer: Self-pay | Source: Home / Self Care | Attending: Gastroenterology

## 2019-04-16 ENCOUNTER — Ambulatory Visit
Admission: RE | Admit: 2019-04-16 | Discharge: 2019-04-16 | Disposition: A | Discharge status: Home / Self Care | Payer: 59 | Attending: Gastroenterology | Admitting: Gastroenterology

## 2019-04-16 ENCOUNTER — Other Ambulatory Visit: Payer: Self-pay

## 2019-04-16 DIAGNOSIS — J45909 Unspecified asthma, uncomplicated: Secondary | ICD-10-CM | POA: Diagnosis not present

## 2019-04-16 DIAGNOSIS — Z86010 Personal history of colonic polyps: Secondary | ICD-10-CM | POA: Insufficient documentation

## 2019-04-16 DIAGNOSIS — K2100 Gastro-esophageal reflux disease with esophagitis, without bleeding: Secondary | ICD-10-CM | POA: Insufficient documentation

## 2019-04-16 DIAGNOSIS — E119 Type 2 diabetes mellitus without complications: Secondary | ICD-10-CM | POA: Diagnosis not present

## 2019-04-16 DIAGNOSIS — Z794 Long term (current) use of insulin: Secondary | ICD-10-CM | POA: Insufficient documentation

## 2019-04-16 DIAGNOSIS — E785 Hyperlipidemia, unspecified: Secondary | ICD-10-CM | POA: Diagnosis not present

## 2019-04-16 DIAGNOSIS — Z1211 Encounter for screening for malignant neoplasm of colon: Secondary | ICD-10-CM | POA: Insufficient documentation

## 2019-04-16 DIAGNOSIS — Z538 Procedure and treatment not carried out for other reasons: Secondary | ICD-10-CM | POA: Diagnosis not present

## 2019-04-16 DIAGNOSIS — I10 Essential (primary) hypertension: Secondary | ICD-10-CM | POA: Diagnosis not present

## 2019-04-16 DIAGNOSIS — K449 Diaphragmatic hernia without obstruction or gangrene: Secondary | ICD-10-CM | POA: Insufficient documentation

## 2019-04-16 DIAGNOSIS — Z7982 Long term (current) use of aspirin: Secondary | ICD-10-CM | POA: Diagnosis not present

## 2019-04-16 DIAGNOSIS — K222 Esophageal obstruction: Secondary | ICD-10-CM | POA: Insufficient documentation

## 2019-04-16 DIAGNOSIS — Z79899 Other long term (current) drug therapy: Secondary | ICD-10-CM | POA: Diagnosis not present

## 2019-04-16 DIAGNOSIS — Z955 Presence of coronary angioplasty implant and graft: Secondary | ICD-10-CM | POA: Diagnosis not present

## 2019-04-16 DIAGNOSIS — K219 Gastro-esophageal reflux disease without esophagitis: Secondary | ICD-10-CM | POA: Diagnosis not present

## 2019-04-16 DIAGNOSIS — Z13810 Encounter for screening for upper gastrointestinal disorder: Secondary | ICD-10-CM | POA: Insufficient documentation

## 2019-04-16 DIAGNOSIS — Z87891 Personal history of nicotine dependence: Secondary | ICD-10-CM | POA: Diagnosis not present

## 2019-04-16 DIAGNOSIS — Z7951 Long term (current) use of inhaled steroids: Secondary | ICD-10-CM | POA: Diagnosis not present

## 2019-04-16 DIAGNOSIS — K2960 Other gastritis without bleeding: Secondary | ICD-10-CM | POA: Insufficient documentation

## 2019-04-16 HISTORY — DX: Gastro-esophageal reflux disease without esophagitis: K21.9

## 2019-04-16 HISTORY — PX: ESOPHAGOGASTRODUODENOSCOPY (EGD) WITH PROPOFOL: SHX5813

## 2019-04-16 HISTORY — PX: COLONOSCOPY WITH PROPOFOL: SHX5780

## 2019-04-16 LAB — GLUCOSE, CAPILLARY: Glucose-Capillary: 152 mg/dL — ABNORMAL HIGH (ref 70–99)

## 2019-04-16 SURGERY — COLONOSCOPY WITH PROPOFOL
Anesthesia: General

## 2019-04-16 MED ORDER — LIDOCAINE HCL (CARDIAC) PF 100 MG/5ML IV SOSY
PREFILLED_SYRINGE | INTRAVENOUS | Status: DC | PRN
  Administered 2019-04-16: 100 mg via INTRAVENOUS

## 2019-04-16 MED ORDER — PROPOFOL 10 MG/ML IV BOLUS
INTRAVENOUS | Status: AC
  Filled 2019-04-16: qty 80

## 2019-04-16 MED ORDER — SODIUM CHLORIDE 0.9 % IV SOLN
INTRAVENOUS | Status: DC
  Administered 2019-04-16: 1000 mL via INTRAVENOUS

## 2019-04-16 MED ORDER — PROPOFOL 500 MG/50ML IV EMUL
INTRAVENOUS | Status: AC
  Filled 2019-04-16: qty 150

## 2019-04-16 MED ORDER — PROPOFOL 500 MG/50ML IV EMUL
INTRAVENOUS | Status: DC | PRN
  Administered 2019-04-16: 60 mg via INTRAVENOUS
  Administered 2019-04-16 (×2): 20 mg via INTRAVENOUS

## 2019-04-16 MED ORDER — PROPOFOL 500 MG/50ML IV EMUL
INTRAVENOUS | Status: DC | PRN
  Administered 2019-04-16: 70 ug/kg/min via INTRAVENOUS

## 2019-04-16 NOTE — Anesthesia Preprocedure Evaluation (Addendum)
Anesthesia Evaluation  Patient identified by MRN, date of birth, ID band Patient awake    Reviewed: Allergy & Precautions, NPO status , Patient's Chart, lab work & pertinent test results  History of Anesthesia Complications Negative for: history of anesthetic complications  Airway Mallampati: III  TM Distance: >3 FB Neck ROM: Full    Dental no notable dental hx. (+) Poor Dentition   Pulmonary asthma , neg sleep apnea, neg COPD, Patient abstained from smoking.Not current smoker, former smoker,    Pulmonary exam normal breath sounds clear to auscultation       Cardiovascular Exercise Tolerance: Good METShypertension, Pt. on medications + Cardiac Stents  (-) CAD and (-) Past MI (-) dysrhythmias  Rhythm:Regular Rate:Normal - Systolic murmurs S/p stent placement, unable to recall exact date, but sometime between June and December 2020.  Cath 07/2018:  Prox LAD lesion is 75% stenosed.  1st Diag lesion is 50% stenosed.  Prox LAD to Mid LAD lesion is 90% stenosed.  Prox RCA to Mid RCA lesion is 10% stenosed.  LV end diastolic pressure is normal.   Conclusion Normal overall left ventricular function of 55% Complex proximal LAD lesion involving the first diagonal with a 111 bifurcation lesion 75% pre-bifurcation 90% post sidebranch and 50% ostial sidebranch diagonal 1 RCA was free of disease in the right dominant system Intervention was deferred    Neuro/Psych  Headaches, PSYCHIATRIC DISORDERS Depression    GI/Hepatic GERD  ,(+)     (-) substance abuse  ,   Endo/Other  diabetes, Type 2, Insulin Dependent  Renal/GU negative Renal ROS     Musculoskeletal   Abdominal   Peds  Hematology   Anesthesia Other Findings Past Medical History: No date: Asthma No date: Diabetes (HCC) No date: GERD (gastroesophageal reflux disease) No date: Hyperlipidemia No date: Hypertension No date: Legg-Perthes disease  Reproductive/Obstetrics                            Anesthesia Physical Anesthesia Plan  ASA: III  Anesthesia Plan: General   Post-op Pain Management:    Induction: Intravenous  PONV Risk Score and Plan: 2 and Ondansetron, Propofol infusion and TIVA  Airway Management Planned: Nasal Cannula  Additional Equipment: None  Intra-op Plan:   Post-operative Plan:   Informed Consent: I have reviewed the patients History and Physical, chart, labs and discussed the procedure including the risks, benefits and alternatives for the proposed anesthesia with the patient or authorized representative who has indicated his/her understanding and acceptance.     Dental advisory given  Plan Discussed with: CRNA and Surgeon  Anesthesia Plan Comments: (Discussed risks of anesthesia with patient, including possibility of difficulty with spontaneous ventilation under anesthesia necessitating airway intervention, PONV, and rare risks such as cardiac or respiratory or neurological events. Patient understands. Patient counseled on being higher risk for anesthesia due to comorbidities: recent cardiac stent placement <6 months ago. Patient was told about increased risk of cardiac and respiratory events, including death. Patient understands. )        Anesthesia Quick Evaluation

## 2019-04-16 NOTE — Anesthesia Postprocedure Evaluation (Signed)
Anesthesia Post Note  Patient: Corey [name]  Procedure(s) Performed: COLONOSCOPY WITH PROPOFOL (N/A ) ESOPHAGOGASTRODUODENOSCOPY (EGD) WITH PROPOFOL (N/A )  Patient location during evaluation: Endoscopy Anesthesia Type: General Level of consciousness: awake and alert Pain management: pain level controlled Vital Signs Assessment: post-procedure vital signs reviewed and stable Respiratory status: spontaneous breathing, nonlabored ventilation, respiratory function stable and patient connected to nasal cannula oxygen Cardiovascular status: blood pressure returned to baseline and stable Postop Assessment: no apparent nausea or vomiting Anesthetic complications: no     Last Vitals:  Vitals:   04/16/19 0852 04/16/19 0902  BP: 123/84 (!) 147/89  Pulse:    Resp:  13  Temp:    SpO2:      Last Pain:  Vitals:   04/16/19 0902  TempSrc:   PainSc: 0-No pain                 Corinda Gubler

## 2019-04-16 NOTE — Op Note (Signed)
[facility] Gastroenterology Patient Name: Corey [name] Procedure Date: 04/16/2019 7:37 AM MRN: [id number] Account #: [id number] Date of Birth: 07/14/1955 Admit Type: Outpatient Age: 64 Room: [facility] ENDO ROOM 1 Gender: Male Note Status: Finalized Procedure:             Colonoscopy Indications:           High risk colon cancer surveillance: Personal history                         of colonic polyps Providers:             Jonathon Bellows MD, MD Medicines:             Monitored Anesthesia Care Complications:         No immediate complications. Procedure:             Pre-Anesthesia Assessment:                        - Prior to the procedure, a History and Physical was                         performed, and patient medications, allergies and                         sensitivities were reviewed. The patient's tolerance                         of previous anesthesia was reviewed.                        - The risks and benefits of the procedure and the                         sedation options and risks were discussed with the                         patient. All questions were answered and informed                         consent was obtained.                        - ASA Grade Assessment: III - A patient with severe                         systemic disease.                        After obtaining informed consent, the colonoscope was                         passed under direct vision. Throughout the procedure,                         the patient's blood pressure, pulse, and oxygen                         saturations were monitored continuously. The  Colonoscope was introduced through the anus and                         advanced to the the transverse colon. The colonoscopy                         was performed without difficulty. The patient                         tolerated the procedure well. The quality of the bowel                         preparation  was poor. Findings:      The perianal and digital rectal examinations were normal.      A large amount of stool was found in the sigmoid colon, in the       descending colon and in the transverse colon, interfering with       visualization. Impression:            - Preparation of the colon was poor.                        - Stool in the sigmoid colon, in the descending colon                         and in the transverse colon.                        - No specimens collected. Recommendation:        - Discharge patient to home (with escort).                        - Resume previous diet.                        - Continue present medications.                        - Repeat colonoscopy in 2 weeks because the bowel                         preparation was suboptimal. Procedure Code(s):     --- Professional ---                        [phone], 53, Colonoscopy, flexible; diagnostic,                         including collection of specimen(s) by brushing or                         washing, when performed (separate procedure) Diagnosis Code(s):     --- Professional ---                        Z86.010, Personal history of colonic polyps CPT copyright 2019 American Medical Association. All rights reserved. The codes documented in this report are preliminary and upon coder review may  be revised to meet current compliance requirements. Wyline Mood, MD Wyline Mood MD, MD 04/16/2019 8:38:23 AM This report has been signed  electronically. Number of Addenda: 0 Note Initiated On: 04/16/2019 7:37 AM Scope Withdrawal Time: 0 hours 0 minutes 2 seconds  Total Procedure Duration: 0 hours 2 minutes 2 seconds  Estimated Blood Loss:  Estimated blood loss: none.      [facility]

## 2019-04-16 NOTE — Op Note (Signed)
[facility] Gastroenterology Patient Name: Corey [name] Procedure Date: 04/16/2019 7:37 AM MRN: [id number] Account #: [id number] Date of Birth: April 11, 1955 Admit Type: Outpatient Age: 64 Room: [facility] ENDO ROOM 1 Gender: Male Note Status: Finalized Procedure:             Upper GI endoscopy Indications:           Exclusion of Barrett's esophagus Providers:             Jonathon Bellows MD, MD Medicines:             Monitored Anesthesia Care Complications:         No immediate complications. Procedure:             Pre-Anesthesia Assessment:                        - Prior to the procedure, a History and Physical was                         performed, and patient medications, allergies and                         sensitivities were reviewed. The patient's tolerance                         of previous anesthesia was reviewed.                        - The risks and benefits of the procedure and the                         sedation options and risks were discussed with the                         patient. All questions were answered and informed                         consent was obtained.                        - ASA Grade Assessment: III - A patient with severe                         systemic disease.                        After obtaining informed consent, the endoscope was                         passed under direct vision. Throughout the procedure,                         the patient's blood pressure, pulse, and oxygen                         saturations were monitored continuously. The Endoscope                         was introduced through the mouth, and advanced to the  third part of duodenum. The upper GI endoscopy was                         accomplished with ease. The patient tolerated the                         procedure well. Findings:      The examined duodenum was normal.      Patchy moderate inflammation characterized by congestion  (edema),       erythema and aphthous ulcerations was found on the lesser curvature of       the stomach. Biopsies were taken with a cold forceps for histology.      A small hiatal hernia was present.      A mild Schatzki ring was found at the gastroesophageal junction.      LA Grade A (one or more mucosal breaks less than 5 mm, not extending       between tops of 2 mucosal folds) esophagitis with no bleeding was found       at the gastroesophageal junction.      The exam was otherwise without abnormality. Impression:            - Normal examined duodenum.                        - Gastritis. Biopsied.                        - Small hiatal hernia.                        - Mild Schatzki ring.                        - LA Grade A reflux esophagitis with no bleeding.                        - The examination was otherwise normal. Recommendation:        - Await pathology results.                        - Repeat upper endoscopy in 6 weeks to evaluate the                         response to therapy.                        - Use Prilosec (omeprazole) 40 mg PO BID for 3 months. Procedure Code(s):     --- Professional ---                        [phone], Esophagogastroduodenoscopy, flexible,                         transoral; with biopsy, single or multiple Diagnosis Code(s):     --- Professional ---                        K29.70, Gastritis, unspecified, without bleeding                        K44.9, Diaphragmatic hernia without  obstruction or                         gangrene                        K21.00                        K22.2, Esophageal obstruction CPT copyright 2019 American Medical Association. All rights reserved. The codes documented in this report are preliminary and upon coder review may  be revised to meet current compliance requirements. Wyline Mood, MD Wyline Mood MD, MD 04/16/2019 8:33:08 AM This report has been signed electronically. Number of Addenda: 0 Note Initiated On:  04/16/2019 7:37 AM Estimated Blood Loss:  Estimated blood loss: none.      [facility]

## 2019-04-16 NOTE — H&P (Signed)
Jonathon Bellows, MD [address] [place], [place], [place], [address] [address], [place], [place], [place], [address] Phone: [phone]  Fax: [phone]  Primary Care Physician:  Volney American, PA-C   Pre-Procedure History & Physical: HPI:  RAKESH DUTKO is a 64 y.o. male is here for an endoscopy and colonoscopy    Past Medical History:  Diagnosis Date  . Asthma   . Diabetes ([place])   . GERD (gastroesophageal reflux disease)   . Hyperlipidemia   . Hypertension   . Legg-Perthes disease     Past Surgical History:  Procedure Laterality Date  . CARDIAC CATHETERIZATION    . Foot Surgery    . HIP SURGERY    . RIGHT/LEFT HEART CATH AND CORONARY ANGIOGRAPHY N/A 07/25/2018   Procedure: RIGHT/LEFT HEART CATH AND CORONARY ANGIOGRAPHY;  Surgeon: Yolonda Kida, MD;  Location: [facility];  Service: Cardiovascular;  Laterality: N/A;    Prior to Admission medications   Medication Sig Start Date End Date Taking? Authorizing Provider  amitriptyline (ELAVIL) 50 MG tablet TAKE 1 TO 2 TABLETS(50 TO 100 MG) BY MOUTH AT BEDTIME 04/07/19  Yes Volney American, PA-C  buPROPion (WELLBUTRIN XL) 150 MG 24 hr tablet TAKE 1 TABLET(150 MG) BY MOUTH DAILY 01/16/19  Yes Volney American, PA-C  cetirizine (ZYRTEC) 10 MG tablet Take 1 tablet (10 mg total) by mouth daily. 01/21/19  Yes Volney American, PA-C  citalopram (CELEXA) 10 MG tablet TAKE 1 TABLET BY MOUTH  DAILY 04/16/19  Yes Volney American, PA-C  furosemide (LASIX) 20 MG tablet TAKE 1 TABLET(20 MG) BY MOUTH TWICE DAILY 01/21/19  Yes Volney American, PA-C  JARDIANCE 25 MG TABS tablet Take 25 mg by mouth daily. 03/18/19  Yes [provider]  LANTUS SOLOSTAR 100 UNIT/ML Solostar Pen INJECT 75 UNITS [place] D. Patient taking differently: 90 Units. INJECT 75 UNITS [place] D. 07/25/18  Yes Volney American, PA-C  losartan (COZAAR) 50 MG tablet Take 50 mg by mouth daily.   Yes [provider]  metFORMIN (GLUCOPHAGE) 1000 MG tablet TAKE 1 TABLET BY MOUTH  TWICE DAILY 04/16/19  Yes Volney American, PA-C  metoprolol succinate (TOPROL-XL) 25 MG 24 hr tablet Take 25 mg by mouth daily.   Yes [provider]  montelukast (SINGULAIR) 10 MG tablet TAKE 1 TABLET(10 MG) BY MOUTH DAILY 10/20/18  Yes Emond, Megan P, DO  pantoprazole (PROTONIX) 40 MG tablet Take 40 mg by mouth daily.   Yes [provider]  pregabalin (LYRICA) 150 MG capsule Take 1 capsule (150 mg total) by mouth 2 (two) times daily. 03/05/19  Yes Gillis Santa, MD  primidone (MYSOLINE) 50 MG tablet TAKE 2 TABLETS(100 MG) BY MOUTH DAILY 02/20/19  Yes Volney American, PA-C  rosuvastatin (CRESTOR) 20 MG tablet Take 20 mg by mouth daily.   Yes [provider]  sucralfate (CARAFATE) 1 g tablet TAKE 1 TABLET(1 GRAM) BY MOUTH THREE TIMES DAILY AS NEEDED 02/06/19  Yes Volney American, PA-C  tiZANidine (ZANAFLEX) 4 MG tablet Take 1 tablet (4 mg total) by mouth every 12 (twelve) hours as needed for muscle spasms. 03/05/19 09/01/19 Yes Gillis Santa, MD  Accu-Chek FastClix Lancets MISC USE TO CHECK BLOOD SUGAR 11/23/18   Volney American, [place]  ACCU-CHEK GUIDE test strip USE TO CHECK BLOOD SUGAR TID 08/02/17   Volney American, PA-C  acetaminophen (TYLENOL) 500 MG tablet Take 1,000 mg by mouth every  6 (six) hours as needed for moderate pain or headache.    [provider]  albuterol (PROVENTIL) (2.5 MG/3ML) 0.083% nebulizer solution USE 1 VIAL IN NEBULIZER EVERY 6 HOURS - and as needed. 04/01/19   Volney American, PA-C  albuterol (VENTOLIN HFA) 108 (90 Base) MCG/ACT inhaler INHALE 2 PUFFS INTO THE LUNGS EVERY 6 HOURS AS NEEDED FOR WHEEZING OR SHORTNESS OF BREATH 11/29/18   Volney American, PA-C  aspirin EC 81 MG tablet Take 81 mg by mouth daily.    [provider]  azelastine (ASTELIN) 0.1 % nasal spray USE 2 SPRAYS IN EACH NOSTRIL TWICE DAILY 02/27/19    Volney American, PA-C  B-D UF III MINI PEN NEEDLES 31G X 5 MM MISC USE TWICE DAILY 06/20/18   Volney American, PA-C  Blood Glucose Monitoring Suppl (ACCU-CHEK GUIDE) w/Device KIT U UTD 06/02/17   [provider]  Cholecalciferol (VITAMIN D3) 5000 units TABS Take 5,000 Units by mouth daily.     [provider]  clopidogrel (PLAVIX) 75 MG tablet Take by mouth. 09/05/18 09/05/19  [provider]  diclofenac sodium (VOLTAREN) 1 % GEL Apply 1 application topically 4 (four) times daily as needed (pain).     [provider]  Fluticasone-Salmeterol (ADVAIR) 500-50 MCG/DOSE AEPB Inhale 1 puff into the lungs 2 (two) times daily. 04/30/18   Volney American, PA-C  Insulin Syringe-Needle U-100 30G X 1/2" 1 ML MISC 1 Units by Does not apply route daily. 09/21/17   Volney American, PA-C  isosorbide mononitrate (IMDUR) 60 MG 24 hr tablet Take 60 mg by mouth daily. 03/29/19   [provider]  Lancets Misc. (ACCU-CHEK FASTCLIX LANCET) KIT 1 Units by Does not apply route 2 (two) times daily. 09/21/17   Volney American, PA-C  magnesium gluconate (MAGONATE) 500 MG tablet Take 500 mg by mouth daily.    [provider]  meloxicam (MOBIC) 7.5 MG tablet Take 1 tablet (7.5 mg total) by mouth daily. 01/21/19   Volney American, PA-C  nystatin cream (MYCOSTATIN) Apply 1 application topically 2 (two) times daily. 01/28/19   Volney American, PA-C  Semaglutide, 1 MG/DOSE, (OZEMPIC, 1 MG/DOSE,) 2 MG/1.5ML SOPN Inject 1 mg into the skin once a week.    [provider]    Allergies as of 03/28/2019  . (No Known Allergies)    Family History  Problem Relation Age of Onset  . Cancer Mother   . Heart disease Mother   . Diabetes Father     Social History   Socioeconomic History  . Marital status: Divorced    Spouse name: Not on file  . Number of children: Not on file  . Years of education: Not on file  . Highest education  level: 8th grade  Occupational History  . Not on file  Tobacco Use  . Smoking status: Former Smoker    Packs/day: 0.50    Types: Cigarettes  . Smokeless tobacco: Current User    Types: Chew  . Tobacco comment: quit 40 years ago   Substance and Sexual Activity  . Alcohol use: Not Currently  . Drug use: Never  . Sexual activity: Not on file  Other Topics Concern  . Not on file  Social History Narrative  . Not on file   Social Determinants of Health   Financial Resource Strain: Medium Risk  . Difficulty of Paying Living Expenses: Somewhat hard  Food Insecurity: No Food Insecurity  . Worried About  Running Out of Food in the Last Year: Never true  . Ran Out of Food in the Last Year: Never true  Transportation Needs: No Transportation Needs  . Lack of Transportation (Medical): No  . Lack of Transportation (Non-Medical): No  Physical Activity:   . Days of Exercise per Week:   . Minutes of Exercise per Session:   Stress: No Stress Concern Present  . Feeling of Stress : Not at all  Social Connections:   . Frequency of Communication with Friends and Family:   . Frequency of Social Gatherings with Friends and Family:   . Attends Religious Services:   . Active Member of Clubs or Organizations:   . Attends Archivist Meetings:   Marland Kitchen Marital Status:   Intimate Partner Violence:   . Fear of Current or Ex-Partner:   . Emotionally Abused:   Marland Kitchen Physically Abused:   . Sexually Abused:     Review of Systems: See HPI, otherwise negative ROS  Physical Exam: BP 135/78   Pulse 77   Temp (!) 97.5 F (36.4 C) (Tympanic)   Resp 18   Ht _0  (1.727 m)   Wt 96.2 kg   SpO2 96%   BMI 32.23 kg/m  General:   Alert,  pleasant and cooperative in NAD Head:  Normocephalic and atraumatic. Neck:  Supple; no masses or thyromegaly. Lungs:  Clear throughout to auscultation, normal respiratory effort.    Heart:  +S1, +S2, Regular rate and rhythm, No edema. Abdomen:  Soft, nontender  and nondistended. Normal bowel sounds, without guarding, and without rebound.   Neurologic:  Alert and  oriented x4;  grossly normal neurologically.  Impression/Plan: MANPREET STREY is here for an endoscopy and colonoscopy  to be performed for  evaluation of barrettes screening and colon cancer screening prior history of colon polyps 10 years back    Risks, benefits, limitations, and alternatives regarding endoscopy have been reviewed with the patient.  Questions have been answered.  All parties agreeable.   Jonathon Bellows, MD  04/16/2019, 8:21 AM

## 2019-04-16 NOTE — Transfer of Care (Signed)
Immediate Anesthesia Transfer of Care Note  Patient: TAYSOM GLYMPH  Procedure(s) Performed: COLONOSCOPY WITH PROPOFOL (N/A ) ESOPHAGOGASTRODUODENOSCOPY (EGD) WITH PROPOFOL (N/A )  Patient Location: PACU and Endoscopy Unit  Anesthesia Type:General  Level of Consciousness: awake, oriented, drowsy and patient cooperative  Airway & Oxygen Therapy: Patient Spontanous Breathing  Post-op Assessment: Report given to RN, Post -op Vital signs reviewed and stable and Patient moving all extremities  Post vital signs: Reviewed and stable  Last Vitals:  Vitals Value Taken Time  BP 128/70 04/16/19 0843  Temp 36.3 C 04/16/19 0842  Pulse 82 04/16/19 0845  Resp 13 04/16/19 0845  SpO2 98 % 04/16/19 0845  Vitals shown include unvalidated device data.  Last Pain:  Vitals:   04/16/19 0842  TempSrc: Temporal  PainSc: 0-No pain         Complications: No apparent anesthesia complications

## 2019-04-16 NOTE — Telephone Encounter (Signed)
Requested Prescriptions  Pending Prescriptions Disp Refills  . citalopram (CELEXA) 10 MG tablet [Pharmacy Med Name: CITALOPRAM  10MG  TAB] 90 tablet 3    Sig: TAKE 1 TABLET BY MOUTH  DAILY     Psychiatry:  Antidepressants - SSRI Passed - 04/16/2019  5:43 AM      Passed - Completed PHQ-2 or PHQ-9 in the last 360 days.      Passed - Valid encounter within last 6 months    Recent Outpatient Visits          2 months ago Chronic bilateral low back pain with bilateral sciatica   [facility] Volney American, [place]   3 months ago Type 2 diabetes mellitus with hyperglycemia, with long-term current use of insulin [place])   [facility], Lilia Argue, [place]   5 months ago Pain, dental   [facility] Volney American, [place]   7 months ago Essential hypertension   [place], Jeannette How, MD   9 months ago Insulin dependent diabetes mellitus [facility])   [facility] Volney American, [place]      Future Appointments            In 1 month Jonathon Bellows, MD [place]   In 2 months Orene Desanctis, Lilia Argue, PA-C [place]           . metFORMIN (GLUCOPHAGE) 1000 MG tablet [Pharmacy Med Name: MetFORMIN 1000MG TABLET] 180 tablet 3    Sig: TAKE 1 TABLET BY MOUTH  TWICE DAILY     Endocrinology:  Diabetes - Biguanides Failed - 04/16/2019  5:43 AM      Failed - HBA1C is between 0 and 7.9 and within 180 days    HB A1C (BAYER DCA - WAIVED)  Date Value Ref Range Status  03/02/2018 9.7 (H) <7.0 % Final    Comment:                                          Diabetic Adult            <7.0                                       Healthy Adult        4.3 - 5.7                                                           (DCCT/NGSP) American Diabetes Association's Summary of Glycemic Recommendations for Adults with Diabetes: Hemoglobin A1c <7.0%. More stringent glycemic goals (A1c <6.0%) may further  reduce complications at the cost of increased risk of hypoglycemia.          Passed - Cr in normal range and within 360 days    Creatinine, Ser  Date Value Ref Range Status  12/24/2018 1.02 0.76 - 1.27 mg/dL Final         Passed - eGFR in normal range and within 360 days    GFR calc Af Amer  Date Value Ref Range Status  12/24/2018 91 >59 mL/min/1.73 Final  GFR calc non Af Amer  Date Value Ref Range Status  12/24/2018 79 >59 mL/min/1.73 Final         Passed - Valid encounter within last 6 months    Recent Outpatient Visits          2 months ago Chronic bilateral low back pain with bilateral sciatica   [facility] Volney American, PA-C   3 months ago Type 2 diabetes mellitus with hyperglycemia, with long-term current use of insulin [facility])   [facility] Volney American, [place]   5 months ago Pain, dental   [facility] Volney American, [place]   7 months ago Essential hypertension   [place], Mark A, MD   9 months ago Insulin dependent diabetes mellitus [facility])   [facility] Volney American, [place]      Future Appointments            In 1 month Jonathon Bellows, MD [place]   In 2 months Orene Desanctis, Lilia Argue, PA-C [facility], [facility]           Note:  HgB A1C checked out another facility on 03/18/2019 was 9.3

## 2019-04-17 ENCOUNTER — Encounter: Payer: Self-pay | Admitting: *Deleted

## 2019-04-17 LAB — SURGICAL PATHOLOGY

## 2019-04-18 ENCOUNTER — Encounter: Payer: Self-pay | Admitting: Gastroenterology

## 2019-04-19 ENCOUNTER — Other Ambulatory Visit: Payer: Self-pay | Admitting: Family Medicine

## 2019-04-23 ENCOUNTER — Telehealth: Payer: Self-pay | Admitting: Family Medicine

## 2019-04-23 NOTE — Telephone Encounter (Signed)
° °  KNB 04/23/2019  Name: Corey [name]    MRN: [id number]    DOB: [birth year]    AGE: 64 y.o.    GENDER: male    PCP Particia Nearing, PA-C.   Called pt regarding Community Resource Referral for assistance with finding an affordable cellphone/plan. Pt stated that he had applied a couple of times for assistance but had not heard anything back. Began application for [email or web address] and after submitting information for Corey [name] the form showed he had already been approved for the service for [employer] support for a discounted bill earlier in March, transferred pt to their customer service department to finish getting him signed up for that program.   Patient also stated that his girlfriend takes him to his appts and he does not need addtl transportation options at this point.   Closing referral pending any other needs of patient.  Manuela Schwartz  Care Guide  Embedded Care Coordination [facility] Management [email or web address]   [phone]

## 2019-04-29 ENCOUNTER — Ambulatory Visit: Payer: Self-pay | Admitting: Licensed Clinical Social Worker

## 2019-04-29 ENCOUNTER — Ambulatory Visit: Payer: Self-pay | Admitting: Pharmacist

## 2019-04-29 ENCOUNTER — Telehealth: Payer: Self-pay

## 2019-04-29 DIAGNOSIS — J4540 Moderate persistent asthma, uncomplicated: Secondary | ICD-10-CM

## 2019-04-29 DIAGNOSIS — I10 Essential (primary) hypertension: Secondary | ICD-10-CM | POA: Diagnosis not present

## 2019-04-29 DIAGNOSIS — E1165 Type 2 diabetes mellitus with hyperglycemia: Secondary | ICD-10-CM

## 2019-04-29 DIAGNOSIS — F3341 Major depressive disorder, recurrent, in partial remission: Secondary | ICD-10-CM | POA: Diagnosis not present

## 2019-04-29 DIAGNOSIS — Z794 Long term (current) use of insulin: Secondary | ICD-10-CM

## 2019-04-29 NOTE — Chronic Care Management (AMB) (Signed)
  Chronic Care Management   Note  04/29/2019 Name: Corey [name] MRN: [id number] DOB: [birth year]  [name] is a 64 y.o. year old male who is a primary care patient of Particia Nearing, [place]. The CCM team was consulted for assistance with chronic disease management and care coordination needs.   Ezana called today to report that  [phone] is "government phone" number that will not be cut off. He still has [phone] cell as well, but may be cut off depending on ability to pay bill. I have added this new phone as his "work phone" in chart.   Catie Feliz Beam, PharmD, [facility] Clinical Pharmacist [facility] Practice/[facility] [phone]

## 2019-04-29 NOTE — Telephone Encounter (Signed)
Copied from CRM [id number]. Topic: General - Other >> Apr 29, 2019  9:58 AM Daphine Deutscher D wrote: Reason for CRM: Marcelino Duster from [employer] called regarding a fax they sent Friday for an order bilateral wrist and ankle supports. Can this please be signed and faxed back to [phone]  CB# [phone] x [phone] >> Apr 29, 2019 11:33 AM Genia Harold, CMA wrote: Form placed in Rachel's folder to be signed.

## 2019-04-29 NOTE — Chronic Care Management (AMB) (Signed)
Chronic Care Management    Clinical Social Work Follow Up Note  04/29/2019 Name: Corey [name] MRN: [id number] DOB: 09-Mar-1955  Corey [name] is a 64 y.o. year old male who is a primary care patient of Volney American, [place]. The CCM team was consulted for assistance with [employer] .   Review of patient status, including review of consultants reports, other relevant assessments, and collaboration with appropriate care team members and the patient's provider was performed as part of comprehensive patient evaluation and provision of chronic care management services.    SDOH (Social Determinants of Health) assessments performed: Yes    Outpatient Encounter Medications as of 04/29/2019  Medication Sig Note  . Accu-Chek FastClix Lancets MISC USE TO CHECK BLOOD SUGAR   . ACCU-CHEK GUIDE test strip USE TO CHECK BLOOD SUGAR TID   . acetaminophen (TYLENOL) 500 MG tablet Take 1,000 mg by mouth every 6 (six) hours as needed for moderate pain or headache.   . albuterol (PROVENTIL) (2.5 MG/3ML) 0.083% nebulizer solution USE 1 VIAL IN NEBULIZER EVERY 6 HOURS - and as needed. 04/03/2019: Using BID-TID  . albuterol (VENTOLIN HFA) 108 (90 Base) MCG/ACT inhaler INHALE 2 PUFFS INTO THE LUNGS EVERY 6 HOURS AS NEEDED FOR WHEEZING OR SHORTNESS OF BREATH   . amitriptyline (ELAVIL) 50 MG tablet TAKE 1 TO 2 TABLETS(50 TO 100 MG) BY MOUTH AT BEDTIME   . aspirin EC 81 MG tablet Take 81 mg by mouth daily.   Marland Kitchen azelastine (ASTELIN) 0.1 % nasal spray USE 2 SPRAYS IN EACH NOSTRIL TWICE DAILY   . B-D UF III MINI PEN NEEDLES 31G X 5 MM MISC USE TWICE DAILY   . Blood Glucose Monitoring Suppl (ACCU-CHEK GUIDE) w/Device KIT U UTD   . buPROPion (WELLBUTRIN XL) 150 MG 24 hr tablet TAKE 1 TABLET(150 MG) BY MOUTH DAILY   . cetirizine (ZYRTEC) 10 MG tablet Take 1 tablet (10 mg total) by mouth daily.   . Cholecalciferol (VITAMIN D3) 5000 units TABS Take 5,000 Units by mouth daily.    . citalopram (CELEXA) 10 MG  tablet TAKE 1 TABLET BY MOUTH  DAILY   . clopidogrel (PLAVIX) 75 MG tablet Take by mouth.   . diclofenac sodium (VOLTAREN) 1 % GEL Apply 1 application topically 4 (four) times daily as needed (pain).    . Fluticasone-Salmeterol (ADVAIR) 500-50 MCG/DOSE AEPB Inhale 1 puff into the lungs 2 (two) times daily.   . furosemide (LASIX) 20 MG tablet TAKE 1 TABLET(20 MG) BY MOUTH TWICE DAILY   . Insulin Syringe-Needle U-100 30G X 1/2" 1 ML MISC 1 Units by Does not apply route daily.   . isosorbide mononitrate (IMDUR) 60 MG 24 hr tablet Take 60 mg by mouth daily.   Marland Kitchen JARDIANCE 25 MG TABS tablet Take 25 mg by mouth daily.   . Lancets Misc. (ACCU-CHEK FASTCLIX LANCET) KIT 1 Units by Does not apply route 2 (two) times daily.   Marland Kitchen LANTUS SOLOSTAR 100 UNIT/ML Solostar Pen INJECT 75 UNITS [place] D. (Patient taking differently: 90 Units. INJECT 75 UNITS  D.) 01/18/2019: 85 units   . losartan (COZAAR) 50 MG tablet Take 50 mg by mouth daily.   . magnesium gluconate (MAGONATE) 500 MG tablet Take 500 mg by mouth daily.   . meloxicam (MOBIC) 7.5 MG tablet Take 1 tablet (7.5 mg total) by mouth daily.   . metFORMIN (GLUCOPHAGE) 1000 MG tablet TAKE 1 TABLET BY MOUTH  TWICE DAILY   . metoprolol succinate (TOPROL-XL) 25  MG 24 hr tablet Take 25 mg by mouth daily.   . montelukast (SINGULAIR) 10 MG tablet TAKE 1 TABLET(10 MG) BY MOUTH DAILY   . nystatin cream (MYCOSTATIN) Apply 1 application topically 2 (two) times daily.   . pantoprazole (PROTONIX) 40 MG tablet Take 40 mg by mouth daily.   . pregabalin (LYRICA) 150 MG capsule Take 1 capsule (150 mg total) by mouth 2 (two) times daily.   . primidone (MYSOLINE) 50 MG tablet TAKE 2 TABLETS(100 MG) BY MOUTH DAILY   . rosuvastatin (CRESTOR) 20 MG tablet Take 20 mg by mouth daily.   . Semaglutide, 1 MG/DOSE, (OZEMPIC, 1 MG/DOSE,) 2 MG/1.5ML SOPN Inject 1 mg into the skin once a week.   . sucralfate (CARAFATE) 1 g tablet TAKE 1 TABLET(1 GRAM) BY MOUTH THREE TIMES DAILY AS NEEDED     . tiZANidine (ZANAFLEX) 4 MG tablet Take 1 tablet (4 mg total) by mouth every 12 (twelve) hours as needed for muscle spasms. 04/03/2019: Usually using QPm   No facility-administered encounter medications on file as of 04/29/2019.     Goals Addressed    . "SW- "I need some more resource education." (pt-stated)       Current Barriers:  . Financial constraints related to managing health care and affording bills . ADL IADL limitations  Clinical Social Work Clinical Goal(s):  Marland Kitchen Over the next 120 days, patient will work with SW to address concerns related to lack of financial, personal care, transportation resource education.   Interventions: . Discussed plans with patient for ongoing care management follow up and provided patient with direct contact information for care management team . Advised patient to contact CFP if future transportation issues arise . Assisted patient/caregiver with obtaining information about health plan benefits . Provided education and assistance to client regarding Advanced Directives. . Provided education to patient/caregiver about Hospice and/or Palliative Care services . Patient complains ongoing chronic pain in his knees and back. Reflective listening provided.  Marland Kitchen LCSW provided education on local community resources within his area that would benefit himself and spouse.  . Patient confirms that he is actively working with [employer] and has provided needed information for application completion for financial assistance.  . Patient was educated on appropriate self-care coping tools to implement into his daily routine to promote healthy living. . Patient denies any urgent SW needs at this time  Patient Self Care Activities:  . Calls provider office for new concerns or questions . Lacks social connections  Please see past updates related to this goal by clicking on the "Past Updates" button in the selected goal      Follow Up Plan: SW will follow up with  patient by phone over the next quarter  Eula Fried, Miranda, MSW, [email or web address] Phone: [phone]

## 2019-04-30 NOTE — Telephone Encounter (Signed)
Form Faxed--

## 2019-05-08 ENCOUNTER — Telehealth: Payer: Self-pay

## 2019-05-14 ENCOUNTER — Other Ambulatory Visit: Payer: Self-pay | Admitting: Nurse Practitioner

## 2019-05-20 ENCOUNTER — Other Ambulatory Visit: Payer: Self-pay | Admitting: Family Medicine

## 2019-05-20 ENCOUNTER — Ambulatory Visit (INDEPENDENT_AMBULATORY_CARE_PROVIDER_SITE_OTHER): Payer: 59 | Admitting: Gastroenterology

## 2019-05-20 ENCOUNTER — Other Ambulatory Visit: Payer: Self-pay

## 2019-05-20 VITALS — BP 106/65 | HR 81 | Temp 98.5°F | Ht 66.0 in | Wt 218.4 lb

## 2019-05-20 DIAGNOSIS — K219 Gastro-esophageal reflux disease without esophagitis: Secondary | ICD-10-CM | POA: Diagnosis not present

## 2019-05-20 DIAGNOSIS — Z86010 Personal history of colonic polyps: Secondary | ICD-10-CM

## 2019-05-20 NOTE — Progress Notes (Signed)
Jonathon Bellows MD, MRCP(U.K) [address]  [place]  [place], [place] [address]  Main: [phone]  Fax: [phone]   Primary Care Physician: Volney American, [place]  Primary Gastroenterologist:  Dr. Jonathon Bellows   Follow-up for acid reflux  HPI: Corey [name] is a 64 y.o. male    Summary of history :  Initially referred and seen on February 25, 2019 for acid reflux.  Ongoing for over 10 to 15 years.  History of heartburn.  Was on Prilosec 40 mg once a day with resolution of symptoms.  Interval history 02/25/2019-05/20/2019  04/16/2019: EGD: Inflammation of the lesser curvature of the stomach was seen.  Mild Schatzki's ring at the GE junction.  LA grade a esophagitis was seen.  Biopsies of stomach demonstrated erosive gastritis and negative for H. pylori.  No evidence of Barrett's esophagus seen.  Colonoscopy also performed on the same day demonstrated large amount of stool in the sigmoid colon descending and transverse colon repeat procedure required.   Takes 40 mg of Prilosec daily still has some residual symptoms.  Symptoms are mostly heartburn.  No other complaints.  Current Outpatient Medications  Medication Sig Dispense Refill  . Accu-Chek FastClix Lancets MISC USE TO CHECK BLOOD SUGAR 100 each 12  . ACCU-CHEK GUIDE test strip USE TO CHECK BLOOD SUGAR TID 100 each 12  . acetaminophen (TYLENOL) 500 MG tablet Take 1,000 mg by mouth every 6 (six) hours as needed for moderate pain or headache.    . albuterol (PROVENTIL) (2.5 MG/3ML) 0.083% nebulizer solution USE 1 VIAL IN NEBULIZER EVERY 6 HOURS - and as needed. 75 mL 11  . albuterol (VENTOLIN HFA) 108 (90 Base) MCG/ACT inhaler INHALE 2 PUFFS INTO THE LUNGS EVERY 6 HOURS AS NEEDED FOR WHEEZING OR SHORTNESS OF BREATH 25.5 g 0  . amitriptyline (ELAVIL) 50 MG tablet TAKE 1 TO 2 TABLETS(50 TO 100 MG) BY MOUTH AT BEDTIME 180 tablet 1  . aspirin EC 81 MG tablet Take 81 mg by mouth daily.    Marland Kitchen azelastine (ASTELIN) 0.1 %  nasal spray USE 2 SPRAYS IN EACH NOSTRIL TWICE DAILY 30 mL 3  . B-D UF III MINI PEN NEEDLES 31G X 5 MM MISC USE TWICE DAILY 100 each 11  . Blood Glucose Monitoring Suppl (ACCU-CHEK GUIDE) w/Device KIT U UTD  0  . buPROPion (WELLBUTRIN XL) 150 MG 24 hr tablet TAKE 1 TABLET(150 MG) BY MOUTH DAILY 90 tablet 1  . cetirizine (ZYRTEC) 10 MG tablet Take 1 tablet (10 mg total) by mouth daily. 90 tablet 1  . Cholecalciferol (VITAMIN D3) 5000 units TABS Take 5,000 Units by mouth daily.     . citalopram (CELEXA) 10 MG tablet TAKE 1 TABLET BY MOUTH  DAILY 90 tablet 0  . clopidogrel (PLAVIX) 75 MG tablet Take by mouth.    . diclofenac sodium (VOLTAREN) 1 % GEL Apply 1 application topically 4 (four) times daily as needed (pain).     . Fluticasone-Salmeterol (ADVAIR) 500-50 MCG/DOSE AEPB Inhale 1 puff into the lungs 2 (two) times daily. 1 each 6  . furosemide (LASIX) 20 MG tablet TAKE 1 TABLET(20 MG) BY MOUTH TWICE DAILY 180 tablet 1  . Insulin Syringe-Needle U-100 30G X 1/2" 1 ML MISC 1 Units by Does not apply route daily. 90 each 3  . isosorbide mononitrate (IMDUR) 60 MG 24 hr tablet Take 60 mg by mouth daily.    Marland Kitchen JARDIANCE 25 MG TABS tablet Take 25 mg by mouth daily.    Marland Kitchen  Lancets Misc. (ACCU-CHEK FASTCLIX LANCET) KIT 1 Units by Does not apply route 2 (two) times daily. 3 kit 3  . LANTUS SOLOSTAR 100 UNIT/ML Solostar Pen INJECT 75 UNITS [place] D. (Patient taking differently: 90 Units. INJECT 75 UNITS [place] D.) 45 mL 11  . losartan (COZAAR) 50 MG tablet Take 50 mg by mouth daily.    . magnesium gluconate (MAGONATE) 500 MG tablet Take 500 mg by mouth daily.    . meloxicam (MOBIC) 7.5 MG tablet TAKE 1 TABLET(7.5 MG) BY MOUTH DAILY 30 tablet 2  . metFORMIN (GLUCOPHAGE) 1000 MG tablet TAKE 1 TABLET BY MOUTH  TWICE DAILY 180 tablet 0  . metoprolol succinate (TOPROL-XL) 25 MG 24 hr tablet Take 25 mg by mouth daily.    . montelukast (SINGULAIR) 10 MG tablet TAKE 1 TABLET(10 MG) BY MOUTH DAILY 90 tablet 1  . nystatin  cream (MYCOSTATIN) Apply 1 application topically 2 (two) times daily. 60 g 0  . pantoprazole (PROTONIX) 40 MG tablet Take 40 mg by mouth daily.    . pregabalin (LYRICA) 150 MG capsule Take 1 capsule (150 mg total) by mouth 2 (two) times daily. 60 capsule 5  . primidone (MYSOLINE) 50 MG tablet TAKE 2 TABLETS(100 MG) BY MOUTH DAILY 180 tablet 1  . rosuvastatin (CRESTOR) 20 MG tablet Take 20 mg by mouth daily.    . Semaglutide, 1 MG/DOSE, (OZEMPIC, 1 MG/DOSE,) 2 MG/1.5ML SOPN Inject 1 mg into the skin once a week.    . sucralfate (CARAFATE) 1 g tablet TAKE 1 TABLET(1 GRAM) BY MOUTH THREE TIMES DAILY AS NEEDED 270 tablet 0  . tiZANidine (ZANAFLEX) 4 MG tablet Take 1 tablet (4 mg total) by mouth every 12 (twelve) hours as needed for muscle spasms. 60 tablet 5   No current facility-administered medications for this visit.    Allergies as of 05/20/2019  . (No Known Allergies)    ROS:  General: Negative for anorexia, weight loss, fever, chills, fatigue, weakness. ENT: Negative for hoarseness, difficulty swallowing , nasal congestion. CV: Negative for chest pain, angina, palpitations, dyspnea on exertion, peripheral edema.  Respiratory: Negative for dyspnea at rest, dyspnea on exertion, cough, sputum, wheezing.  GI: See history of present illness. GU:  Negative for dysuria, hematuria, urinary incontinence, urinary frequency, nocturnal urination.  Endo: Negative for unusual weight change.    Physical Examination:   There were no vitals taken for this visit.  General: Well-nourished, well-developed in no acute distress.  Eyes: No icterus. Conjunctivae pink. Neuro: Alert and oriented x 3.  Grossly intact. Skin: Warm and dry, no jaundice.   Psych: Alert and cooperative, normal mood and affect.   Imaging Studies: No results found.  Assessment and Plan:   Corey [name] is a 64 y.o. y/o male here to follow-up for GERD  Plan 1. GERD : No evidence of Barrett's esophagus.  Continue  lifestyle changes and continue PPI long-term as he still had a bit of inflammation of the lower end of the esophagus  In addition suggest to take Pepcid 40 mg at night.  2.  Screening colonoscopy poor prep needs to be repeated incomplete visualization  I have discussed alternative options, risks & benefits,  which include, but are not limited to, bleeding, infection, perforation,respiratory complication & drug reaction.  The patient agrees with this plan & written consent will be obtained.      Dr Jonathon Bellows  MD,MRCP [facility] [facility]) Follow up in as needed

## 2019-05-23 ENCOUNTER — Telehealth: Payer: Self-pay | Admitting: Family Medicine

## 2019-05-23 NOTE — Telephone Encounter (Signed)
Tacey Ruiz is calling from [employer] is calling to see if a DME was received by fax on 05/23/19 at 9:45a for should support for his right shoulder. Please advise CB- [phone] ask for [facility]

## 2019-05-27 ENCOUNTER — Ambulatory Visit: Payer: 59 | Admitting: Gastroenterology

## 2019-05-27 NOTE — Telephone Encounter (Signed)
Document faxed 05/27/2019

## 2019-05-29 ENCOUNTER — Telehealth: Payer: 59 | Admitting: General Practice

## 2019-05-29 ENCOUNTER — Ambulatory Visit (INDEPENDENT_AMBULATORY_CARE_PROVIDER_SITE_OTHER): Payer: 59 | Admitting: General Practice

## 2019-05-29 DIAGNOSIS — I10 Essential (primary) hypertension: Secondary | ICD-10-CM | POA: Diagnosis not present

## 2019-05-29 DIAGNOSIS — F3341 Major depressive disorder, recurrent, in partial remission: Secondary | ICD-10-CM

## 2019-05-29 DIAGNOSIS — E1165 Type 2 diabetes mellitus with hyperglycemia: Secondary | ICD-10-CM

## 2019-05-29 DIAGNOSIS — G8929 Other chronic pain: Secondary | ICD-10-CM

## 2019-05-29 DIAGNOSIS — E782 Mixed hyperlipidemia: Secondary | ICD-10-CM

## 2019-05-29 DIAGNOSIS — Z794 Long term (current) use of insulin: Secondary | ICD-10-CM

## 2019-05-29 DIAGNOSIS — G894 Chronic pain syndrome: Secondary | ICD-10-CM

## 2019-05-29 NOTE — Patient Instructions (Signed)
Visit Information  Goals Addressed            This Visit's Progress   . RN-I have several health problems (pt-stated)       Current Barriers:  . Film/video editor.  . Chronic Disease Management support and education needs related to asthma, HTN, Angina, DMII and Chronic back pain related to degenerative disc and bilateral sciatica and the need for a scooter to maintain mobility and independence   Nurse Case Manager Clinical Goal(s):  Marland Kitchen Over the next 120 days, patient will work with [facility]  to address needs related to Managing multiple chronic disease processes   Interventions:  . Collaborated with PCP clinical staff  regarding patient's need for office visit to be evaluated specifically for POV/Scooter - the patient states that he has the scooter now and it is working well for him . All paperwork faxed including office notes to Otila Back at [facility] division. Fax # [phone] (done 01/2019) . Evaluate the patients needs for new request of wrist braces and ankle braces to help with the "arthritis" pain he is having- the patient states he has not discussed this with the pcp or pain clinic.  The patient has these braces.  Now is requesting a shoulder brace. Instructed the patient to talk to the pcp about this at upcoming appointment on March 24-2021 at 10:15 . Encouraged the patient to call his insurance company to find out about cost and also to utilize the OTC benefit catalog for standard braces.   . Advised the patient that he should speak to the pcp or pain clinic MD for recommendations on what to use to help with the arthritis pain in his ankles,hands, and  shoulders . Educated on calling the provider for changes in level, intensity, or unresolved pain  Patient Self Care Activities:  . Patient verbalizes understanding of plan to obtain braces for hands/wrist and ankles . Unable to perform IADLs independently  Please see past updates related to this goal by clicking on  the "Past Updates" button in the selected goal      . RNCM:"My sugar was up this am because I ate grapes" (pt-stated)       Current Barriers:  Marland Kitchen Knowledge Deficits related to negative impact on health and well being in a patient with chronic disease processes and not following a prescribed Heart healthy/ADA diet . Film/video editor.  . Transportation barriers  Nurse Case Manager Clinical Goal(s):  Marland Kitchen Over the next 120 days, patient will verbalize understanding of plan for following a Heart healthy/ADA diet . Over the next 120 days, patient will work with [place], pcp and CCM team to address needs related to dietary restrictions and following a heart healthy/ADA diet . Over the next 120 days, patient will demonstrate a decrease in hyperglycemic exacerbations as evidenced by decreased blood sugar readings and decrease hemoglobin A1C levels . Over the next 120 days, patient will demonstrate improved health management independence as evidenced bymaking healthy food choice, watching hidden salts and sugars, continuing to monitor blood sugars . Over the next 120 days, patient will work with CM team pharmacist to monitor medications  . Over the next 90 days, patient will work with CM clinical social worker to assist with resources as needed . Over the next 90 days, patient will work with care guides to get adequate transportation  Interventions:  . Evaluation of current treatment plan related to DM and HTN dietary restrictions and patient's adherence to plan as established by provider. Marland Kitchen  Advised patient to monitor his dietary intake.  The patient says his blood sugars vary depending on what he eats. He ate BBQ sandwiches last night, his blood sugar this am was 200.  Education and reiterated the importance of watching salts and sugars in meal planning. . Provided education to patient re: to following a Heart Healthy/ADA diet.  The patient is aware of blood sugars being elevated and blood pressure  elevation with use of high sodium foods.  Marland Kitchen Collaborated with pcp, CCM team regarding management of ADA . Discussed plans with patient for ongoing care management follow up and provided patient with direct contact information for care management team . Advised patient, providing education and rationale, to monitor blood pressure daily and record, calling pcp for findings outside established parameters.  . Advised patient, providing education and rationale, to check cbg bid and record, calling pcp for findings outside established parameters.   . Care Guide referral for transportation/phone- completed  Patient Self Care Activities:  . Patient verbalizes understanding of plan to monitor health and  well being by following prescribed diet for chronic health conditions . Attends all scheduled provider appointments . Calls provider office for new concerns or questions . Unable to independently manage chronic medical conditions . Does not adhere to provider recommendations re: heart healthy/ADA  Please see past updates related to this goal by clicking on the "Past Updates" button in the selected goal         Patient verbalizes understanding of instructions provided today.   The care management team will reach out to the patient again over the next 60 days.   Alto Denver RN, MSN, CCM Community Care Coordinator [place]  [facility] Mobile: [phone]

## 2019-05-29 NOTE — Chronic Care Management (AMB) (Signed)
Chronic Care Management   Follow Up Note   05/29/2019 Name: Corey [name] MRN: [id number] DOB: 1955/09/16  Referred by: Volney American, PA-C Reason for referral : Chronic Care Management (Follow up: DM/HTN/HLD/Breathing- questions about shoulder brace)   Corey [name] is a 64 y.o. year old male who is a primary care patient of Volney American, [place]. The CCM team was consulted for assistance with chronic disease management and care coordination needs.    Review of patient status, including review of consultants reports, relevant laboratory and other test results, and collaboration with appropriate care team members and the patient's provider was performed as part of comprehensive patient evaluation and provision of chronic care management services.    SDOH (Social Determinants of Health) assessments performed: No See Care Plan activities for detailed interventions related to [facility])     Outpatient Encounter Medications as of 05/29/2019  Medication Sig Note  . Accu-Chek FastClix Lancets MISC USE TO CHECK BLOOD SUGAR   . ACCU-CHEK GUIDE test strip USE TO CHECK BLOOD SUGAR TID   . acetaminophen (TYLENOL) 500 MG tablet Take 1,000 mg by mouth every 6 (six) hours as needed for moderate pain or headache.   . albuterol (PROVENTIL) (2.5 MG/3ML) 0.083% nebulizer solution USE 1 VIAL IN NEBULIZER EVERY 6 HOURS - and as needed. 04/03/2019: Using BID-TID  . albuterol (VENTOLIN HFA) 108 (90 Base) MCG/ACT inhaler INHALE 2 PUFFS INTO THE LUNGS EVERY 6 HOURS AS NEEDED FOR WHEEZING OR SHORTNESS OF BREATH   . amitriptyline (ELAVIL) 50 MG tablet TAKE 1 TO 2 TABLETS(50 TO 100 MG) BY MOUTH AT BEDTIME   . aspirin EC 81 MG tablet Take 81 mg by mouth daily.   Marland Kitchen azelastine (ASTELIN) 0.1 % nasal spray USE 2 SPRAYS IN EACH NOSTRIL TWICE DAILY   . B-D UF III MINI PEN NEEDLES 31G X 5 MM MISC USE TWICE DAILY   . Blood Glucose Monitoring Suppl (ACCU-CHEK GUIDE) w/Device KIT U UTD   . buPROPion  (WELLBUTRIN XL) 150 MG 24 hr tablet TAKE 1 TABLET(150 MG) BY MOUTH DAILY   . cetirizine (ZYRTEC) 10 MG tablet Take 1 tablet (10 mg total) by mouth daily.   . Cholecalciferol (VITAMIN D3) 5000 units TABS Take 5,000 Units by mouth daily.    . citalopram (CELEXA) 10 MG tablet TAKE 1 TABLET BY MOUTH  DAILY   . clopidogrel (PLAVIX) 75 MG tablet Take by mouth.   . diclofenac sodium (VOLTAREN) 1 % GEL Apply 1 application topically 4 (four) times daily as needed (pain).    . Fluticasone-Salmeterol (ADVAIR) 500-50 MCG/DOSE AEPB Inhale 1 puff into the lungs 2 (two) times daily.   . furosemide (LASIX) 20 MG tablet TAKE 1 TABLET(20 MG) BY MOUTH TWICE DAILY   . Insulin Syringe-Needle U-100 30G X 1/2" 1 ML MISC 1 Units by Does not apply route daily.   . isosorbide mononitrate (IMDUR) 60 MG 24 hr tablet Take 60 mg by mouth daily.   Marland Kitchen JARDIANCE 25 MG TABS tablet Take 25 mg by mouth daily.   . Lancets Misc. (ACCU-CHEK FASTCLIX LANCET) KIT 1 Units by Does not apply route 2 (two) times daily.   Marland Kitchen LANTUS SOLOSTAR 100 UNIT/ML Solostar Pen INJECT 75 UNITS [place] D. (Patient taking differently: 90 Units. INJECT 75 UNITS [place] D.) 01/18/2019: 85 units   . losartan (COZAAR) 50 MG tablet Take 50 mg by mouth daily.   . magnesium gluconate (MAGONATE) 500 MG tablet Take 500 mg by mouth daily.   Marland Kitchen  meloxicam (MOBIC) 7.5 MG tablet TAKE 1 TABLET(7.5 MG) BY MOUTH DAILY   . metFORMIN (GLUCOPHAGE) 1000 MG tablet TAKE 1 TABLET BY MOUTH  TWICE DAILY   . metoprolol succinate (TOPROL-XL) 25 MG 24 hr tablet Take 25 mg by mouth daily.   . montelukast (SINGULAIR) 10 MG tablet TAKE 1 TABLET(10 MG) BY MOUTH DAILY   . nystatin cream (MYCOSTATIN) Apply 1 application topically 2 (two) times daily.   . pantoprazole (PROTONIX) 40 MG tablet Take 40 mg by mouth daily.   . pregabalin (LYRICA) 150 MG capsule Take 1 capsule (150 mg total) by mouth 2 (two) times daily.   . primidone (MYSOLINE) 50 MG tablet TAKE 2 TABLETS(100 MG) BY MOUTH DAILY   .  rosuvastatin (CRESTOR) 20 MG tablet Take 20 mg by mouth daily.   . Semaglutide, 1 MG/DOSE, (OZEMPIC, 1 MG/DOSE,) 2 MG/1.5ML SOPN Inject 1 mg into the skin once a week.   . sucralfate (CARAFATE) 1 g tablet TAKE 1 TABLET(1 GRAM) BY MOUTH THREE TIMES DAILY AS NEEDED   . tiZANidine (ZANAFLEX) 4 MG tablet Take 1 tablet (4 mg total) by mouth every 12 (twelve) hours as needed for muscle spasms. 04/03/2019: Usually using QPm   No facility-administered encounter medications on file as of 05/29/2019.     Objective:  BP Readings from Last 3 Encounters:  05/20/19 106/65  04/16/19 (!) 147/89  02/25/19 124/83   Lab Results  Component Value Date   HGBA1C 9.3 03/18/2019    Goals Addressed            This Visit's Progress   . RN-I have several health problems (pt-stated)       Current Barriers:  . Film/video editor.  . Chronic Disease Management support and education needs related to asthma, HTN, Angina, DMII and Chronic back pain related to degenerative disc and bilateral sciatica and the need for a scooter to maintain mobility and independence   Nurse Case Manager Clinical Goal(s):  Marland Kitchen Over the next 120 days, patient will work with [facility]  to address needs related to Managing multiple chronic disease processes   Interventions:  . Collaborated with PCP clinical staff  regarding patient's need for office visit to be evaluated specifically for POV/Scooter - the patient states that he has the scooter now and it is working well for him . All paperwork faxed including office notes to Otila Back at [facility] division. Fax # [phone] (done 01/2019) . Evaluate the patients needs for new request of wrist braces and ankle braces to help with the "arthritis" pain he is having- the patient states he has not discussed this with the pcp or pain clinic.  The patient has these braces.  Now is requesting a shoulder brace. Instructed the patient to talk to the pcp about this at upcoming appointment on  March 24-2021 at 10:15 . Encouraged the patient to call his insurance company to find out about cost and also to utilize the OTC benefit catalog for standard braces.   . Advised the patient that he should speak to the pcp or pain clinic MD for recommendations on what to use to help with the arthritis pain in his ankles,hands, and  shoulders . Educated on calling the provider for changes in level, intensity, or unresolved pain  Patient Self Care Activities:  . Patient verbalizes understanding of plan to obtain braces for hands/wrist and ankles . Unable to perform IADLs independently  Please see past updates related to this goal by clicking on the "  Past Updates" button in the selected goal      . RNCM:"My sugar was up this am because I ate grapes" (pt-stated)       Current Barriers:  Marland Kitchen Knowledge Deficits related to negative impact on health and well being in a patient with chronic disease processes and not following a prescribed Heart healthy/ADA diet . Film/video editor.  . Transportation barriers  Nurse Case Manager Clinical Goal(s):  Marland Kitchen Over the next 120 days, patient will verbalize understanding of plan for following a Heart healthy/ADA diet . Over the next 120 days, patient will work with [place], pcp and CCM team to address needs related to dietary restrictions and following a heart healthy/ADA diet . Over the next 120 days, patient will demonstrate a decrease in hyperglycemic exacerbations as evidenced by decreased blood sugar readings and decrease hemoglobin A1C levels . Over the next 120 days, patient will demonstrate improved health management independence as evidenced bymaking healthy food choice, watching hidden salts and sugars, continuing to monitor blood sugars . Over the next 120 days, patient will work with CM team pharmacist to monitor medications  . Over the next 90 days, patient will work with CM clinical social worker to assist with resources as needed . Over the next 90  days, patient will work with care guides to get adequate transportation  Interventions:  . Evaluation of current treatment plan related to DM and HTN dietary restrictions and patient's adherence to plan as established by provider. . Advised patient to monitor his dietary intake.  The patient says his blood sugars vary depending on what he eats. He ate BBQ sandwiches last night, his blood sugar this am was 200.  Education and reiterated the importance of watching salts and sugars in meal planning. . Provided education to patient re: to following a Heart Healthy/ADA diet.  The patient is aware of blood sugars being elevated and blood pressure elevation with use of high sodium foods.  Marland Kitchen Collaborated with pcp, CCM team regarding management of ADA . Discussed plans with patient for ongoing care management follow up and provided patient with direct contact information for care management team . Advised patient, providing education and rationale, to monitor blood pressure daily and record, calling pcp for findings outside established parameters.  . Advised patient, providing education and rationale, to check cbg bid and record, calling pcp for findings outside established parameters.   . Care Guide referral for transportation/phone- completed  Patient Self Care Activities:  . Patient verbalizes understanding of plan to monitor health and  well being by following prescribed diet for chronic health conditions . Attends all scheduled provider appointments . Calls provider office for new concerns or questions . Unable to independently manage chronic medical conditions . Does not adhere to provider recommendations re: heart healthy/ADA  Please see past updates related to this goal by clicking on the "Past Updates" button in the selected goal          Plan:   The care management team will reach out to the patient again over the next 60 days.    Noreene Larsson RN, MSN, [facility] Mobile: [phone]

## 2019-06-02 ENCOUNTER — Other Ambulatory Visit: Payer: Self-pay | Admitting: Family Medicine

## 2019-06-02 NOTE — Telephone Encounter (Signed)
Requested Prescriptions  Pending Prescriptions Disp Refills  . buPROPion (WELLBUTRIN XL) 150 MG 24 hr tablet [Pharmacy Med Name: BUPROPION XL 150MG  TABLETS (24 H)] 90 tablet 0    Sig: TAKE 1 TABLET(150 MG) BY MOUTH DAILY     Psychiatry: Antidepressants - bupropion Passed - 06/02/2019  2:54 PM      Passed - Completed PHQ-2 or PHQ-9 in the last 360 days.      Passed - Last BP in normal range    BP Readings from Last 1 Encounters:  05/20/19 106/65         Passed - Valid encounter within last 6 months    Recent Outpatient Visits          4 months ago Chronic bilateral low back pain with bilateral sciatica   [facility], PA-C   5 months ago Type 2 diabetes mellitus with hyperglycemia, with long-term current use of insulin [facility])   [facility], Particia Nearing   6 months ago Pain, dental   [facility], Particia Nearing   9 months ago Essential hypertension   [facility] [place], MD   11 months ago Insulin dependent diabetes mellitus [facility])   [facility], Particia Nearing      Future Appointments            In 4 days [place], MD [facility]   In 3 weeks Deirdre Evener, Maurice March, PA-C [facility], [facility]

## 2019-06-04 ENCOUNTER — Telehealth: Payer: Self-pay

## 2019-06-06 ENCOUNTER — Ambulatory Visit: Payer: 59 | Admitting: Dermatology

## 2019-06-11 ENCOUNTER — Other Ambulatory Visit
Admission: RE | Admit: 2019-06-11 | Discharge: 2019-06-11 | Disposition: A | Discharge status: Home / Self Care | Payer: 59 | Source: Ambulatory Visit | Attending: Gastroenterology | Admitting: Gastroenterology

## 2019-06-11 DIAGNOSIS — Z01812 Encounter for preprocedural laboratory examination: Secondary | ICD-10-CM | POA: Diagnosis present

## 2019-06-11 DIAGNOSIS — Z20822 Contact with and (suspected) exposure to covid-19: Secondary | ICD-10-CM | POA: Insufficient documentation

## 2019-06-12 ENCOUNTER — Ambulatory Visit (INDEPENDENT_AMBULATORY_CARE_PROVIDER_SITE_OTHER): Payer: 59 | Admitting: Pharmacist

## 2019-06-12 ENCOUNTER — Other Ambulatory Visit: Payer: 59

## 2019-06-12 DIAGNOSIS — I10 Essential (primary) hypertension: Secondary | ICD-10-CM

## 2019-06-12 DIAGNOSIS — J4540 Moderate persistent asthma, uncomplicated: Secondary | ICD-10-CM

## 2019-06-12 DIAGNOSIS — Z794 Long term (current) use of insulin: Secondary | ICD-10-CM

## 2019-06-12 DIAGNOSIS — E1165 Type 2 diabetes mellitus with hyperglycemia: Secondary | ICD-10-CM | POA: Diagnosis not present

## 2019-06-12 DIAGNOSIS — K219 Gastro-esophageal reflux disease without esophagitis: Secondary | ICD-10-CM

## 2019-06-12 LAB — SARS CORONAVIRUS 2 (TAT 6-24 HRS): SARS Coronavirus 2: NEGATIVE

## 2019-06-12 NOTE — Patient Instructions (Signed)
Visit Information  Goals Addressed            This Visit's Progress     Patient Stated   . PharmD "I want to take care of my sugars" (pt-stated)       CARE PLAN ENTRY (see longtitudinal plan of care for additional care plan information)  Current Barriers:  . Diabetes: uncontrolled; most recent A1c 9.3%; follows w/ [name] . Current antihyperglycemic regimen: metformin 1000 mg BID, Ozempic 1 mg weekly, Jardiance 25 mg daily, Lantus 90 units (pen only goes up to 80 units, so doing 80 units then 10 units); Jardiance alone started since GLP1 is at max dose . Current meal patterns:  o Fastings: 140-150s; 2 hour after meal 150-160s per patient report  . Current meal patterns: o Switched to no-sugar ice cream o D/t wife's recent A1c elevation, they are working on cutting back on sweets in the house. Patient reports that he isn't drinking regular sodas, though wife is . Current exercise: none, limited by chronic lower back pain. RN CM, clinical staff, and PCP are collaborating on mobility needs and DME scooter . Cardiovascular risk reduction (CAD s/p stent placement 08/16/2018 at [facility]); follows w/ Dr. Juliann Pares o Current hypertensive regimen: furosemide 20 mg BID, losartan 50 mg daily, metoprolol succinate 25 mg daily; metoprolol succinate 25 mg daily, isosorbide 60 mg daily; not checking BP at home, but well controlled at last cardiology appointment o Current hyperlipidemia regimen: rosuvastatin 20 mg daily; last LDL well controlled at goal <70 o Current antiplatelet regimen: ASA 81 mg daily, clopidogrel 75 mg daily (likely DAPT duration x 1 year) - Holding clopidogrel currently prior to colonoscopy . GERD: pantoprazole 40 mg daily, famotidine 40 mg daily per GI  . Depression/chronic pain: amitriptyline 50 mg QPM, citalopram 10 mg daily, primidone 100 mg daily, pregabalin 150 mg BID, bupropion XL 150, meloxicam 7.5 mg daily  . Asthma/Allergic rhinitis: Wixela BID, albuterol  PRN; notes no need for albuterol recently, montelukast 10 mg daily, cetirizine 10 mg daily   Pharmacist Clinical Goal(s):  Marland Kitchen Over the next 90 days, patient with work with PharmD and primary care provider to address optimized glycemic management  Interventions: . Comprehensive medication review performed, medication list updated in electronic medical record . Inter-disciplinary care team collaboration (see longitudinal plan of care) . He has DM f/u next week. Encouraged him to discuss w/ provider that Lantus pen only goes up to 80, and see if there would be a preference to use a more concentrated insulin (eg Tresiba [id number]) vs splitting dose to do 40 units BID. Anecdotally, may provide more benefit of glucose lowering. He notes he will do this . Encouraged to continue to cut back on sweets, sodas, and decrease portion sizes.   Patient Self Care Activities:  . Patient will check blood glucose BID, document, and provide at future appointments . Patient will take medications as prescribed . Patient will report any questions or concerns to provider   Please see past updates related to this goal by clicking on the "Past Updates" button in the selected goal         Patient verbalizes understanding of instructions provided today.  Plan:  - Scheduled f/u call in ~10 weeks  Catie Feliz Beam, PharmD, [facility] Clinical Pharmacist [facility] [phone]

## 2019-06-12 NOTE — Chronic Care Management (AMB) (Signed)
Chronic Care Management   Follow Up Note   06/12/2019 Name: Corey [name] MRN: [id number] DOB: 05-22-1955  Referred by: Volney American, PA-C Reason for referral : Chronic Care Management (Medication Management)   Corey [name] is a 64 y.o. year old male who is a primary care patient of Volney American, [place]. The CCM team was consulted for assistance with chronic disease management and care coordination needs.    Contacted patient for medication management review.   Review of patient status, including review of consultants reports, relevant laboratory and other test results, and collaboration with appropriate care team members and the patient's provider was performed as part of comprehensive patient evaluation and provision of chronic care management services.    SDOH (Social Determinants of Health) assessments performed: Yes See Care Plan activities for detailed interventions related to [facility])     Outpatient Encounter Medications as of 06/12/2019  Medication Sig Note  . Accu-Chek FastClix Lancets MISC USE TO CHECK BLOOD SUGAR   . ACCU-CHEK GUIDE test strip USE TO CHECK BLOOD SUGAR TID   . amitriptyline (ELAVIL) 50 MG tablet TAKE 1 TO 2 TABLETS(50 TO 100 MG) BY MOUTH AT BEDTIME   . aspirin EC 81 MG tablet Take 81 mg by mouth daily.   . B-D UF III MINI PEN NEEDLES 31G X 5 MM MISC USE TWICE DAILY   . Blood Glucose Monitoring Suppl (ACCU-CHEK GUIDE) w/Device KIT U UTD   . buPROPion (WELLBUTRIN XL) 150 MG 24 hr tablet TAKE 1 TABLET(150 MG) BY MOUTH DAILY   . cetirizine (ZYRTEC) 10 MG tablet Take 1 tablet (10 mg total) by mouth daily.   . Cholecalciferol (VITAMIN D3) 5000 units TABS Take 5,000 Units by mouth daily.    . citalopram (CELEXA) 10 MG tablet TAKE 1 TABLET BY MOUTH  DAILY   . famotidine (PEPCID) 40 MG tablet Take 40 mg by mouth daily.   . Fluticasone-Salmeterol (ADVAIR) 500-50 MCG/DOSE AEPB Inhale 1 puff into the lungs 2 (two) times daily.   . furosemide  (LASIX) 20 MG tablet TAKE 1 TABLET(20 MG) BY MOUTH TWICE DAILY   . isosorbide mononitrate (IMDUR) 60 MG 24 hr tablet Take 60 mg by mouth daily.   Marland Kitchen JARDIANCE 25 MG TABS tablet Take 25 mg by mouth daily.   . Lancets Misc. (ACCU-CHEK FASTCLIX LANCET) KIT 1 Units by Does not apply route 2 (two) times daily.   Marland Kitchen LANTUS SOLOSTAR 100 UNIT/ML Solostar Pen INJECT 75 UNITS [place] D. (Patient taking differently: 90 Units. INJECT 75 UNITS [place] D.)   . losartan (COZAAR) 50 MG tablet Take 50 mg by mouth daily.   . magnesium gluconate (MAGONATE) 500 MG tablet Take 500 mg by mouth daily.   . meloxicam (MOBIC) 7.5 MG tablet TAKE 1 TABLET(7.5 MG) BY MOUTH DAILY   . metFORMIN (GLUCOPHAGE) 1000 MG tablet TAKE 1 TABLET BY MOUTH  TWICE DAILY   . metoprolol succinate (TOPROL-XL) 25 MG 24 hr tablet Take 25 mg by mouth daily.   . montelukast (SINGULAIR) 10 MG tablet TAKE 1 TABLET(10 MG) BY MOUTH DAILY   . pantoprazole (PROTONIX) 40 MG tablet Take 40 mg by mouth daily.   . pregabalin (LYRICA) 150 MG capsule Take 1 capsule (150 mg total) by mouth 2 (two) times daily.   . primidone (MYSOLINE) 50 MG tablet TAKE 2 TABLETS(100 MG) BY MOUTH DAILY   . rosuvastatin (CRESTOR) 20 MG tablet Take 20 mg by mouth daily.   . Semaglutide, 1 MG/DOSE, (OZEMPIC,  1 MG/DOSE,) 2 MG/1.5ML SOPN Inject 1 mg into the skin once a week.   Marland Kitchen tiZANidine (ZANAFLEX) 4 MG tablet Take 1 tablet (4 mg total) by mouth every 12 (twelve) hours as needed for muscle spasms. 04/03/2019: Usually using QPm  . [DISCONTINUED] famotidine (PEPCID) 20 MG tablet Take 20 mg by mouth 2 (two) times daily.   Marland Kitchen acetaminophen (TYLENOL) 500 MG tablet Take 1,000 mg by mouth every 6 (six) hours as needed for moderate pain or headache.   . albuterol (PROVENTIL) (2.5 MG/3ML) 0.083% nebulizer solution USE 1 VIAL IN NEBULIZER EVERY 6 HOURS - and as needed. 04/03/2019: Using BID-TID  . albuterol (VENTOLIN HFA) 108 (90 Base) MCG/ACT inhaler INHALE 2 PUFFS INTO THE LUNGS EVERY 6 HOURS AS NEEDED  FOR WHEEZING OR SHORTNESS OF BREATH   . azelastine (ASTELIN) 0.1 % nasal spray USE 2 SPRAYS IN EACH NOSTRIL TWICE DAILY   . clopidogrel (PLAVIX) 75 MG tablet Take by mouth.   . diclofenac sodium (VOLTAREN) 1 % GEL Apply 1 application topically 4 (four) times daily as needed (pain).    . nystatin cream (MYCOSTATIN) Apply 1 application topically 2 (two) times daily.   . sucralfate (CARAFATE) 1 g tablet TAKE 1 TABLET(1 GRAM) BY MOUTH THREE TIMES DAILY AS NEEDED   . [DISCONTINUED] Insulin Syringe-Needle U-100 30G X 1/2" 1 ML MISC 1 Units by Does not apply route daily.    No facility-administered encounter medications on file as of 06/12/2019.     Objective:   Goals Addressed            This Visit's Progress     Patient Stated   . PharmD "I want to take care of my sugars" (pt-stated)       CARE PLAN ENTRY (see longtitudinal plan of care for additional care plan information)  Current Barriers:  . Diabetes: uncontrolled; most recent A1c 9.3%; follows w/ [name] . Current antihyperglycemic regimen: metformin 1000 mg BID, Ozempic 1 mg weekly, Jardiance 25 mg daily, Lantus 90 units (pen only goes up to 80 units, so doing 80 units then 10 units); Jardiance alone started since GLP1 is at max dose . Current meal patterns:  o Fastings: 140-150s; 2 hour after meal 150-160s per patient report  . Current meal patterns: o Switched to no-sugar ice cream o D/t wife's recent A1c elevation, they are working on cutting back on sweets in the house. Patient reports that he isn't drinking regular sodas, though wife is . Current exercise: none, limited by chronic lower back pain. RN CM, clinical staff, and PCP are collaborating on mobility needs and DME scooter . Cardiovascular risk reduction (CAD s/p stent placement 08/16/2018 at [facility]); follows w/ Dr. Clayborn Bigness o Current hypertensive regimen: furosemide 20 mg BID, losartan 50 mg daily, metoprolol succinate 25 mg daily; metoprolol succinate  25 mg daily, isosorbide 60 mg daily; not checking BP at home, but well controlled at last cardiology appointment o Current hyperlipidemia regimen: rosuvastatin 20 mg daily; last LDL well controlled at goal <70 o Current antiplatelet regimen: ASA 81 mg daily, clopidogrel 75 mg daily (likely DAPT duration x 1 year) - Holding clopidogrel currently prior to colonoscopy . GERD: pantoprazole 40 mg daily, famotidine 40 mg daily per GI  . Depression/chronic pain: amitriptyline 50 mg QPM, citalopram 10 mg daily, primidone 100 mg daily, pregabalin 150 mg BID, bupropion XL 150, meloxicam 7.5 mg daily  . Asthma/Allergic rhinitis: Wixela BID, albuterol PRN; notes no need for albuterol recently, montelukast 10 mg  daily, cetirizine 10 mg daily   Pharmacist Clinical Goal(s):  Marland Kitchen Over the next 90 days, patient with work with PharmD and primary care provider to address optimized glycemic management  Interventions: . Comprehensive medication review performed, medication list updated in electronic medical record . Inter-disciplinary care team collaboration (see longitudinal plan of care) . He has DM f/u next week. Encouraged him to discuss w/ provider that Lantus pen only goes up to 80, and see if there would be a preference to use a more concentrated insulin (eg Tresiba [id number]) vs splitting dose to do 40 units BID. Anecdotally, may provide more benefit of glucose lowering. He notes he will do this . Encouraged to continue to cut back on sweets, sodas, and decrease portion sizes.   Patient Self Care Activities:  . Patient will check blood glucose BID, document, and provide at future appointments . Patient will take medications as prescribed . Patient will report any questions or concerns to provider   Please see past updates related to this goal by clicking on the "Past Updates" button in the selected goal          Plan:  - Scheduled f/u call in ~10 weeks  Catie Darnelle Maffucci, PharmD, [place] [phone]

## 2019-06-14 ENCOUNTER — Ambulatory Visit
Admission: RE | Admit: 2019-06-14 | Discharge: 2019-06-14 | Disposition: A | Discharge status: Home / Self Care | Payer: 59 | Attending: Gastroenterology | Admitting: Gastroenterology

## 2019-06-14 ENCOUNTER — Encounter
Admission: RE | Disposition: A | Discharge status: Home / Self Care | Payer: Self-pay | Source: Home / Self Care | Attending: Gastroenterology

## 2019-06-14 ENCOUNTER — Ambulatory Visit: Payer: 59 | Admitting: Certified Registered"

## 2019-06-14 ENCOUNTER — Other Ambulatory Visit: Payer: Self-pay

## 2019-06-14 ENCOUNTER — Encounter: Payer: Self-pay | Admitting: Gastroenterology

## 2019-06-14 DIAGNOSIS — K219 Gastro-esophageal reflux disease without esophagitis: Secondary | ICD-10-CM | POA: Insufficient documentation

## 2019-06-14 DIAGNOSIS — Z79899 Other long term (current) drug therapy: Secondary | ICD-10-CM | POA: Diagnosis not present

## 2019-06-14 DIAGNOSIS — M9110 Juvenile osteochondrosis of head of femur [Legg-Calve-Perthes], unspecified leg: Secondary | ICD-10-CM | POA: Diagnosis not present

## 2019-06-14 DIAGNOSIS — Z7982 Long term (current) use of aspirin: Secondary | ICD-10-CM | POA: Diagnosis not present

## 2019-06-14 DIAGNOSIS — I10 Essential (primary) hypertension: Secondary | ICD-10-CM | POA: Diagnosis not present

## 2019-06-14 DIAGNOSIS — Z87891 Personal history of nicotine dependence: Secondary | ICD-10-CM | POA: Insufficient documentation

## 2019-06-14 DIAGNOSIS — Z794 Long term (current) use of insulin: Secondary | ICD-10-CM | POA: Diagnosis not present

## 2019-06-14 DIAGNOSIS — E119 Type 2 diabetes mellitus without complications: Secondary | ICD-10-CM | POA: Insufficient documentation

## 2019-06-14 DIAGNOSIS — J45909 Unspecified asthma, uncomplicated: Secondary | ICD-10-CM | POA: Diagnosis not present

## 2019-06-14 DIAGNOSIS — Z7902 Long term (current) use of antithrombotics/antiplatelets: Secondary | ICD-10-CM | POA: Diagnosis not present

## 2019-06-14 DIAGNOSIS — Z1211 Encounter for screening for malignant neoplasm of colon: Secondary | ICD-10-CM | POA: Diagnosis present

## 2019-06-14 DIAGNOSIS — Z955 Presence of coronary angioplasty implant and graft: Secondary | ICD-10-CM | POA: Insufficient documentation

## 2019-06-14 DIAGNOSIS — Z86010 Personal history of colonic polyps: Secondary | ICD-10-CM

## 2019-06-14 DIAGNOSIS — E785 Hyperlipidemia, unspecified: Secondary | ICD-10-CM | POA: Insufficient documentation

## 2019-06-14 DIAGNOSIS — Z7951 Long term (current) use of inhaled steroids: Secondary | ICD-10-CM | POA: Diagnosis not present

## 2019-06-14 DIAGNOSIS — Z791 Long term (current) use of non-steroidal anti-inflammatories (NSAID): Secondary | ICD-10-CM | POA: Diagnosis not present

## 2019-06-14 HISTORY — PX: COLONOSCOPY WITH PROPOFOL: SHX5780

## 2019-06-14 LAB — GLUCOSE, CAPILLARY: Glucose-Capillary: 139 mg/dL — ABNORMAL HIGH (ref 70–99)

## 2019-06-14 SURGERY — COLONOSCOPY WITH PROPOFOL
Anesthesia: General

## 2019-06-14 MED ORDER — PROPOFOL 500 MG/50ML IV EMUL
INTRAVENOUS | Status: DC | PRN
  Administered 2019-06-14: 130 ug/kg/min via INTRAVENOUS

## 2019-06-14 MED ORDER — PROPOFOL 500 MG/50ML IV EMUL
INTRAVENOUS | Status: AC
  Filled 2019-06-14: qty 50

## 2019-06-14 MED ORDER — PROPOFOL 10 MG/ML IV BOLUS
INTRAVENOUS | Status: DC | PRN
  Administered 2019-06-14: 50 mg via INTRAVENOUS

## 2019-06-14 MED ORDER — SODIUM CHLORIDE 0.9 % IV SOLN
INTRAVENOUS | Status: DC
  Administered 2019-06-14: 1000 mL via INTRAVENOUS

## 2019-06-14 NOTE — Op Note (Addendum)
[facility] Gastroenterology Patient Name: Corey [name] Procedure Date: 06/14/2019 8:56 AM MRN: [id number] Account #: [id number] Date of Birth: 1955/08/23 Admit Type: Outpatient Age: 64 Room: [facility] ENDO ROOM 2 Gender: Male Note Status: Finalized Procedure:             Colonoscopy Indications:           Screening for colorectal malignant neoplasm Providers:             Wyline Mood MD, MD Medicines:             Monitored Anesthesia Care Complications:         No immediate complications. Procedure:             Pre-Anesthesia Assessment:                        - Prior to the procedure, a History and Physical was                         performed, and patient medications, allergies and                         sensitivities were reviewed. The patient's tolerance                         of previous anesthesia was reviewed.                        - The risks and benefits of the procedure and the                         sedation options and risks were discussed with the                         patient. All questions were answered and informed                         consent was obtained.                        - ASA Grade Assessment: II - A patient with mild                         systemic disease.                        After obtaining informed consent, the colonoscope was                         passed under direct vision. Throughout the procedure,                         the patient's blood pressure, pulse, and oxygen                         saturations were monitored continuously. The                         Colonoscope was introduced through the anus and  advanced to the the cecum, identified by the                         appendiceal orifice. The colonoscopy was performed                         with ease. The patient tolerated the procedure well.                         The quality of the bowel preparation was poor. Findings:      The  perianal and digital rectal examinations were normal.      A large amount of semi-liquid stool was found in the entire colon,       interfering with visualization. Impression:            - Preparation of the colon was poor.                        - Stool in the entire examined colon.                        - No specimens collected. Recommendation:        - Discharge patient to home (with escort).                        - Resume previous diet.                        - Continue present medications.                        - Repeat colonoscopy in 4 weeks because the bowel                         preparation was suboptimal. Procedure Code(s):     --- Professional ---                        [id number], Colonoscopy, flexible; diagnostic, including                         collection of specimen(s) by brushing or washing, when                         performed (separate procedure) Diagnosis Code(s):     --- Professional ---                        Z12.11, Encounter for screening for malignant neoplasm                         of colon CPT copyright 2019 American Medical Association. All rights reserved. The codes documented in this report are preliminary and upon coder review may  be revised to meet current compliance requirements. Jonathon Bellows, MD Jonathon Bellows MD, MD 06/14/2019 9:30:10 AM This report has been signed electronically. Number of Addenda: 0 Note Initiated On: 06/14/2019 8:56 AM Scope Withdrawal Time: 0 hours 4 minutes 9 seconds  Total Procedure Duration: 0 hours 7 minutes 17 seconds  Estimated Blood Loss:  Estimated blood loss: none.      [facility]

## 2019-06-14 NOTE — Anesthesia Postprocedure Evaluation (Signed)
Anesthesia Post Note  Patient: Corey [name]  Procedure(s) Performed: COLONOSCOPY WITH PROPOFOL (N/A )  Patient location during evaluation: Endoscopy Anesthesia Type: General Level of consciousness: awake and alert Pain management: pain level controlled Vital Signs Assessment: post-procedure vital signs reviewed and stable Respiratory status: spontaneous breathing, nonlabored ventilation, respiratory function stable and patient connected to nasal cannula oxygen Cardiovascular status: blood pressure returned to baseline and stable Postop Assessment: no apparent nausea or vomiting Anesthetic complications: no     Last Vitals:  Vitals:   06/14/19 0858 06/14/19 0933  BP: 131/80 118/70  Pulse: 80 79  Resp: 16 10  Temp: (!) 36.2 C 36.7 C  SpO2: 98% 95%    Last Pain:  Vitals:   06/14/19 0953  TempSrc:   PainSc: 0-No pain                 Lenard Simmer

## 2019-06-14 NOTE — H&P (Signed)
Corey Bellows, MD [address], [place], [place], [place], [address] [address], [place], [place], [place], [address] Phone: [phone]  Fax: [phone]  Primary Care Physician:  Volney American, PA-C   Pre-Procedure History & Physical: HPI:  Corey [name] is a 64 y.o. male is here for an colonoscopy.   Past Medical History:  Diagnosis Date  . Asthma   . Diabetes ([place])   . GERD (gastroesophageal reflux disease)   . Hyperlipidemia   . Hypertension   . Legg-Perthes disease     Past Surgical History:  Procedure Laterality Date  . CARDIAC CATHETERIZATION    . COLONOSCOPY WITH PROPOFOL N/A 04/16/2019   Procedure: COLONOSCOPY WITH PROPOFOL;  Surgeon: Corey Bellows, MD;  Location: [facility] ENDOSCOPY;  Service: Gastroenterology;  Laterality: N/A;  . ESOPHAGOGASTRODUODENOSCOPY (EGD) WITH PROPOFOL N/A 04/16/2019   Procedure: ESOPHAGOGASTRODUODENOSCOPY (EGD) WITH PROPOFOL;  Surgeon: Corey Bellows, MD;  Location: [facility] ENDOSCOPY;  Service: Gastroenterology;  Laterality: N/A;  . Foot Surgery    . HIP SURGERY    . RIGHT/LEFT HEART CATH AND CORONARY ANGIOGRAPHY N/A 07/25/2018   Procedure: RIGHT/LEFT HEART CATH AND CORONARY ANGIOGRAPHY;  Surgeon: Yolonda Kida, MD;  Location: [place] CV LAB;  Service: Cardiovascular;  Laterality: N/A;    Prior to Admission medications   Medication Sig Start Date End Date Taking? Authorizing Provider  Accu-Chek FastClix Lancets MISC USE TO CHECK BLOOD SUGAR 11/23/18  Yes Merrie Roof [place], [place]  ACCU-CHEK GUIDE test strip USE TO CHECK BLOOD SUGAR TID 08/02/17  Yes Volney American, PA-C  acetaminophen (TYLENOL) 500 MG tablet Take 1,000 mg by mouth every 6 (six) hours as needed for moderate pain or headache.   Yes [provider]  albuterol (PROVENTIL) (2.5 MG/3ML) 0.083% nebulizer solution USE 1 VIAL IN NEBULIZER EVERY 6 HOURS - and as needed. 04/01/19  Yes Volney American, PA-C  albuterol (VENTOLIN HFA) 108 (90  Base) MCG/ACT inhaler INHALE 2 PUFFS INTO THE LUNGS EVERY 6 HOURS AS NEEDED FOR WHEEZING OR SHORTNESS OF BREATH 11/29/18  Yes Volney American, PA-C  amitriptyline (ELAVIL) 50 MG tablet TAKE 1 TO 2 TABLETS(50 TO 100 MG) BY MOUTH AT BEDTIME 04/07/19  Yes Volney American, PA-C  aspirin EC 81 MG tablet Take 81 mg by mouth daily.   Yes [provider]  azelastine (ASTELIN) 0.1 % nasal spray USE 2 SPRAYS IN EACH NOSTRIL TWICE DAILY 04/19/19  Yes Volney American, PA-C  B-D UF III MINI PEN NEEDLES 31G X 5 MM MISC USE TWICE DAILY 06/20/18  Yes Volney American, PA-C  Blood Glucose Monitoring Suppl (ACCU-CHEK GUIDE) w/Device KIT U UTD 06/02/17  Yes [provider]  buPROPion (WELLBUTRIN XL) 150 MG 24 hr tablet TAKE 1 TABLET(150 MG) BY MOUTH DAILY 06/02/19  Yes Volney American, PA-C  cetirizine (ZYRTEC) 10 MG tablet Take 1 tablet (10 mg total) by mouth daily. 01/21/19  Yes Volney American, PA-C  Cholecalciferol (VITAMIN D3) 5000 units TABS Take 5,000 Units by mouth daily.    Yes [provider]  citalopram (CELEXA) 10 MG tablet TAKE 1 TABLET BY MOUTH  DAILY 04/16/19  Yes Volney American, PA-C  clopidogrel (PLAVIX) 75 MG tablet Take by mouth. 09/05/18 09/05/19 Yes [provider]  diclofenac sodium (VOLTAREN) 1 % GEL Apply 1 application topically 4 (four) times daily as needed (pain).    Yes [provider]  famotidine (PEPCID) 40 MG tablet Take 40 mg by mouth  daily.   Yes [provider]  Fluticasone-Salmeterol (ADVAIR) 500-50 MCG/DOSE AEPB Inhale 1 puff into the lungs 2 (two) times daily. 04/30/18  Yes Volney American, PA-C  furosemide (LASIX) 20 MG tablet TAKE 1 TABLET(20 MG) BY MOUTH TWICE DAILY 01/21/19  Yes Volney American, PA-C  isosorbide mononitrate (IMDUR) 60 MG 24 hr tablet Take 60 mg by mouth daily. 03/29/19  Yes [provider]  JARDIANCE 25 MG TABS tablet Take 25 mg by mouth daily. 03/18/19   Yes [provider]  Lancets Misc. (ACCU-CHEK FASTCLIX LANCET) KIT 1 Units by Does not apply route 2 (two) times daily. 09/21/17  Yes Volney American, PA-C  LANTUS SOLOSTAR 100 UNIT/ML Solostar Pen INJECT 75 UNITS [place] D. Patient taking differently: 90 Units. INJECT 75 UNITS [place] D. 07/25/18  Yes Volney American, PA-C  losartan (COZAAR) 50 MG tablet Take 50 mg by mouth daily.   Yes [provider]  magnesium gluconate (MAGONATE) 500 MG tablet Take 500 mg by mouth daily.   Yes [provider]  meloxicam (MOBIC) 7.5 MG tablet TAKE 1 TABLET(7.5 MG) BY MOUTH DAILY 05/20/19  Yes Volney American, PA-C  metFORMIN (GLUCOPHAGE) 1000 MG tablet TAKE 1 TABLET BY MOUTH  TWICE DAILY 04/16/19  Yes Volney American, PA-C  metoprolol succinate (TOPROL-XL) 25 MG 24 hr tablet Take 25 mg by mouth daily.   Yes [provider]  montelukast (SINGULAIR) 10 MG tablet TAKE 1 TABLET(10 MG) BY MOUTH DAILY 10/20/18  Yes Tinkham, Megan P, DO  nystatin cream (MYCOSTATIN) Apply 1 application topically 2 (two) times daily. 01/28/19  Yes Volney American, PA-C  pantoprazole (PROTONIX) 40 MG tablet Take 40 mg by mouth daily.   Yes [provider]  pregabalin (LYRICA) 150 MG capsule Take 1 capsule (150 mg total) by mouth 2 (two) times daily. 03/05/19  Yes Gillis Santa, MD  primidone (MYSOLINE) 50 MG tablet TAKE 2 TABLETS(100 MG) BY MOUTH DAILY 02/20/19  Yes Volney American, PA-C  rosuvastatin (CRESTOR) 20 MG tablet Take 20 mg by mouth daily.   Yes [provider]  Semaglutide, 1 MG/DOSE, (OZEMPIC, 1 MG/DOSE,) 2 MG/1.5ML SOPN Inject 1 mg into the skin once a week.   Yes [provider]  sucralfate (CARAFATE) 1 g tablet TAKE 1 TABLET(1 GRAM) BY MOUTH THREE TIMES DAILY AS NEEDED 02/06/19  Yes Volney American, PA-C  tiZANidine (ZANAFLEX) 4 MG tablet Take 1 tablet (4 mg total) by mouth every 12 (twelve) hours as needed for muscle spasms. 03/05/19  09/01/19 Yes Gillis Santa, MD    Allergies as of 05/21/2019  . (No Known Allergies)    Family History  Problem Relation Age of Onset  . Cancer Mother   . Heart disease Mother   . Diabetes Father     Social History   Socioeconomic History  . Marital status: Divorced    Spouse name: Not on file  . Number of children: Not on file  . Years of education: Not on file  . Highest education level: 8th grade  Occupational History  . Not on file  Tobacco Use  . Smoking status: Former Smoker    Packs/day: 0.50    Types: Cigarettes  . Smokeless tobacco: Current User    Types: Chew  . Tobacco comment: quit 40 years ago   Substance and Sexual Activity  . Alcohol use: Not Currently  . Drug use: Never  . Sexual activity: Not on file  Other Topics Concern  .  Not on file  Social History Narrative  . Not on file   Social Determinants of Health   Financial Resource Strain: Medium Risk  . Difficulty of Paying Living Expenses: Somewhat hard  Food Insecurity: No Food Insecurity  . Worried About Charity fundraiser in the Last Year: Never true  . Ran Out of Food in the Last Year: Never true  Transportation Needs: No Transportation Needs  . Lack of Transportation (Medical): No  . Lack of Transportation (Non-Medical): No  Physical Activity:   . Days of Exercise per Week:   . Minutes of Exercise per Session:   Stress: No Stress Concern Present  . Feeling of Stress : Not at all  Social Connections:   . Frequency of Communication with Friends and Family:   . Frequency of Social Gatherings with Friends and Family:   . Attends Religious Services:   . Active Member of Clubs or Organizations:   . Attends Archivist Meetings:   Marland Kitchen Marital Status:   Intimate Partner Violence:   . Fear of Current or Ex-Partner:   . Emotionally Abused:   Marland Kitchen Physically Abused:   . Sexually Abused:     Review of Systems: See HPI, otherwise negative ROS  Physical Exam: There were no vitals  taken for this visit. General:   Alert,  pleasant and cooperative in NAD Head:  Normocephalic and atraumatic. Neck:  Supple; no masses or thyromegaly. Lungs:  Clear throughout to auscultation, normal respiratory effort.    Heart:  +S1, +S2, Regular rate and rhythm, No edema. Abdomen:  Soft, nontender and nondistended. Normal bowel sounds, without guarding, and without rebound.   Neurologic:  Alert and  oriented x4;  grossly normal neurologically.  Impression/Plan: Corey [name] is here for an colonoscopy to be performed for Screening colonoscopy average risk   Risks, benefits, limitations, and alternatives regarding  colonoscopy have been reviewed with the patient.  Questions have been answered.  All parties agreeable.   Corey Bellows, MD  06/14/2019, 8:55 AM

## 2019-06-14 NOTE — Anesthesia Preprocedure Evaluation (Signed)
Anesthesia Evaluation  Patient identified by MRN, date of birth, ID band Patient awake    Reviewed: Allergy & Precautions, NPO status , Patient's Chart, lab work & pertinent test results  History of Anesthesia Complications Negative for: history of anesthetic complications  Airway Mallampati: III  TM Distance: >3 FB Neck ROM: Full    Dental no notable dental hx. (+) Poor Dentition   Pulmonary asthma , neg sleep apnea, neg COPD, Patient abstained from smoking.Not current smoker, former smoker,    Pulmonary exam normal breath sounds clear to auscultation       Cardiovascular Exercise Tolerance: Good METShypertension, Pt. on medications + Cardiac Stents  (-) CAD and (-) Past MI (-) dysrhythmias  Rhythm:Regular Rate:Normal - Systolic murmurs S/p stent placement, unable to recall exact date, but sometime between June and December 2020.  Cath 07/2018:  Prox LAD lesion is 75% stenosed.  1st Diag lesion is 50% stenosed.  Prox LAD to Mid LAD lesion is 90% stenosed.  Prox RCA to Mid RCA lesion is 10% stenosed.  LV end diastolic pressure is normal.   Conclusion Normal overall left ventricular function of 55% Complex proximal LAD lesion involving the first diagonal with a 111 bifurcation lesion 75% pre-bifurcation 90% post sidebranch and 50% ostial sidebranch diagonal 1 RCA was free of disease in the right dominant system Intervention was deferred    Neuro/Psych  Headaches, PSYCHIATRIC DISORDERS Depression    GI/Hepatic GERD  ,(+)     (-) substance abuse  ,   Endo/Other  diabetes, Type 2, Insulin Dependent  Renal/GU negative Renal ROS     Musculoskeletal   Abdominal   Peds  Hematology   Anesthesia Other Findings Past Medical History: No date: Asthma No date: Diabetes (HCC) No date: GERD (gastroesophageal reflux disease) No date: Hyperlipidemia No date: Hypertension No date: Legg-Perthes disease   Reproductive/Obstetrics                             Anesthesia Physical  Anesthesia Plan  ASA: III  Anesthesia Plan: General   Post-op Pain Management:    Induction: Intravenous  PONV Risk Score and Plan: 2 and Propofol infusion and TIVA  Airway Management Planned: Nasal Cannula  Additional Equipment: None  Intra-op Plan:   Post-operative Plan:   Informed Consent: I have reviewed the patients History and Physical, chart, labs and discussed the procedure including the risks, benefits and alternatives for the proposed anesthesia with the patient or authorized representative who has indicated his/her understanding and acceptance.     Dental advisory given  Plan Discussed with: CRNA and Surgeon  Anesthesia Plan Comments: (Discussed risks of anesthesia with patient, including possibility of difficulty with spontaneous ventilation under anesthesia necessitating airway intervention, PONV, and rare risks such as cardiac or respiratory or neurological events. Patient understands. Patient counseled on being higher risk for anesthesia due to comorbidities: recent cardiac stent placement <6 months ago. Patient was told about increased risk of cardiac and respiratory events, including death. Patient understands. )        Anesthesia Quick Evaluation

## 2019-06-14 NOTE — Transfer of Care (Signed)
Immediate Anesthesia Transfer of Care Note  Patient: Corey [name]  Procedure(s) Performed: COLONOSCOPY WITH PROPOFOL (N/A )  Patient Location: PACU and Endoscopy Unit  Anesthesia Type:General  Level of Consciousness: sedated  Airway & Oxygen Therapy: Patient Spontanous Breathing  Post-op Assessment: Report given to RN  Post vital signs: stable  Last Vitals:  Vitals Value Taken Time  BP    Temp    Pulse    Resp    SpO2      Last Pain:  Vitals:   06/14/19 0858  TempSrc: Temporal  PainSc: 0-No pain         Complications: No apparent anesthesia complications

## 2019-06-20 ENCOUNTER — Other Ambulatory Visit: Payer: Self-pay

## 2019-06-20 ENCOUNTER — Other Ambulatory Visit: Payer: Self-pay | Admitting: Family Medicine

## 2019-06-20 DIAGNOSIS — Z86010 Personal history of colonic polyps: Secondary | ICD-10-CM

## 2019-06-20 MED ORDER — NA SULFATE-K SULFATE-MG SULF 17.5-3.13-1.6 GM/177ML PO SOLN: 354.0000 mL | Freq: Once | ORAL | 0 refills | Status: AC

## 2019-06-20 NOTE — Progress Notes (Unsigned)
supr 

## 2019-06-24 ENCOUNTER — Ambulatory Visit (INDEPENDENT_AMBULATORY_CARE_PROVIDER_SITE_OTHER): Payer: 59 | Admitting: Family Medicine

## 2019-06-24 ENCOUNTER — Encounter: Payer: 59 | Admitting: Family Medicine

## 2019-06-24 ENCOUNTER — Other Ambulatory Visit: Payer: Self-pay

## 2019-06-24 ENCOUNTER — Encounter: Payer: Self-pay | Admitting: Family Medicine

## 2019-06-24 VITALS — BP 123/68 | HR 77 | Temp 98.1°F | Wt 222.0 lb

## 2019-06-24 DIAGNOSIS — J309 Allergic rhinitis, unspecified: Secondary | ICD-10-CM

## 2019-06-24 DIAGNOSIS — J4540 Moderate persistent asthma, uncomplicated: Secondary | ICD-10-CM

## 2019-06-24 DIAGNOSIS — G8929 Other chronic pain: Secondary | ICD-10-CM

## 2019-06-24 DIAGNOSIS — K219 Gastro-esophageal reflux disease without esophagitis: Secondary | ICD-10-CM

## 2019-06-24 DIAGNOSIS — E291 Testicular hypofunction: Secondary | ICD-10-CM

## 2019-06-24 DIAGNOSIS — M5442 Lumbago with sciatica, left side: Secondary | ICD-10-CM

## 2019-06-24 DIAGNOSIS — F3341 Major depressive disorder, recurrent, in partial remission: Secondary | ICD-10-CM

## 2019-06-24 DIAGNOSIS — E1165 Type 2 diabetes mellitus with hyperglycemia: Secondary | ICD-10-CM

## 2019-06-24 DIAGNOSIS — I10 Essential (primary) hypertension: Secondary | ICD-10-CM

## 2019-06-24 DIAGNOSIS — G2581 Restless legs syndrome: Secondary | ICD-10-CM

## 2019-06-24 DIAGNOSIS — M5441 Lumbago with sciatica, right side: Secondary | ICD-10-CM

## 2019-06-24 DIAGNOSIS — E782 Mixed hyperlipidemia: Secondary | ICD-10-CM

## 2019-06-24 DIAGNOSIS — Z794 Long term (current) use of insulin: Secondary | ICD-10-CM

## 2019-06-24 MED ORDER — PANTOPRAZOLE SODIUM 40 MG PO TBEC: 40.0000 mg | DELAYED_RELEASE_TABLET | Freq: Two times a day (BID) | ORAL | 3 refills | Status: DC

## 2019-06-24 NOTE — Progress Notes (Signed)
BP 123/68   Pulse 77   Temp 98.1 F (36.7 C) (Oral)   Wt 222 lb (100.7 kg)   SpO2 95%   BMI 33.75 kg/m    Subjective:    Patient ID: Corey [name], male    DOB: 04-21-1955, 64 y.o.   MRN: [id number]  HPI: Corey [name] is a 64 y.o. male  Chief Complaint  Patient presents with  . Diabetes  . Hypertension  . Hyperlipidemia  . Gastroesophageal Reflux   Here today for 6 month f/u chronic conditions.   HTN - not checking home BPs. Taking medications faithfully without side effects. Denies CP, SOB, HAs, dizziness.   DM - followed by Endocrinology  Testosterone supplementation - used to be on injections years ago, having low libido, erectile issues, low energy again and wondering about being back on it. Has failed topical therapy in the past.   Allergies/asthma - consistent with inhaler and allergy regimen, denies recent exacerbations.   Depression - under good control with wellbutrin, celexa and elavil. Denies SI/HI.   RLS, chronic back pain and sciatica - stable on lyrica  GERD - GI advised BID PPI dosing but script written for daily dosing, requesting increased script to be sent  Depression screen [facility] 2/9 06/24/2019 12/24/2018 12/06/2018  Decreased Interest 0 0 0  Down, Depressed, Hopeless 0 0 0  PHQ - 2 Score 0 0 0  Altered sleeping 0 0 -  Tired, decreased energy 0 0 -  Change in appetite 0 0 -  Feeling bad or failure about yourself  0 0 -  Trouble concentrating 0 0 -  Moving slowly or fidgety/restless 0 0 -  Suicidal thoughts 0 0 -  PHQ-9 Score 0 0 -  Difficult doing work/chores - - -   GAD 7 : Generalized Anxiety Score 12/24/2018  Nervous, Anxious, on Edge 0  Control/stop worrying 0  Worry too much - different things 0  Trouble relaxing 0  Restless 0  Easily annoyed or irritable 0  Afraid - awful might happen 0  Total GAD 7 Score 0   Relevant past medical, surgical, family and social history reviewed and updated as indicated. Interim medical history  since our last visit reviewed. Allergies and medications reviewed and updated.  Review of Systems  Per HPI unless specifically indicated above     Objective:    BP 123/68   Pulse 77   Temp 98.1 F (36.7 C) (Oral)   Wt 222 lb (100.7 kg)   SpO2 95%   BMI 33.75 kg/m   Wt Readings from Last 3 Encounters:  06/24/19 222 lb (100.7 kg)  06/14/19 214 lb (97.1 kg)  05/20/19 218 lb 6.4 oz (99.1 kg)    Physical Exam Vitals and nursing note reviewed.  Constitutional:      Appearance: Normal appearance.  HENT:     Head: Atraumatic.  Eyes:     Extraocular Movements: Extraocular movements intact.     Conjunctiva/sclera: Conjunctivae normal.  Cardiovascular:     Rate and Rhythm: Normal rate and regular rhythm.  Pulmonary:     Effort: Pulmonary effort is normal.     Breath sounds: Normal breath sounds.  Musculoskeletal:        General: Normal range of motion.     Cervical back: Normal range of motion and neck supple.  Skin:    General: Skin is warm and dry.  Neurological:     General: No focal deficit present.     Mental  Status: He is oriented to person, place, and time.  Psychiatric:        Mood and Affect: Mood normal.        Thought Content: Thought content normal.        Judgment: Judgment normal.     Results for orders placed or performed in visit on 06/24/19  Comprehensive metabolic panel  Result Value Ref Range   Glucose 171 (H) 65 - 99 mg/dL   BUN 15 8 - 27 mg/dL   Creatinine, Ser 1.51 0.76 - 1.27 mg/dL   GFR calc non Af Amer 74 >59 mL/min/1.73   GFR calc Af Amer 86 >59 mL/min/1.73   BUN/Creatinine Ratio 14 10 - 24   Sodium 138 134 - 144 mmol/L   Potassium 4.2 3.5 - 5.2 mmol/L   Chloride 99 96 - 106 mmol/L   CO2 23 20 - 29 mmol/L   Calcium 8.5 (L) 8.6 - 10.2 mg/dL   Total Protein 6.5 6.0 - 8.5 g/dL   Albumin 4.1 3.8 - 4.8 g/dL   Globulin, Total 2.4 1.5 - 4.5 g/dL   Albumin/Globulin Ratio 1.7 1.2 - 2.2   Bilirubin Total 0.5 0.0 - 1.2 mg/dL   Alkaline  Phosphatase 135 (H) 48 - 121 IU/L   AST 33 0 - 40 IU/L   ALT 37 0 - 44 IU/L  Lipid Panel w/o Chol/HDL Ratio  Result Value Ref Range   Cholesterol, Total 135 100 - 199 mg/dL   Triglycerides 761 (H) 0 - 149 mg/dL   HDL 34 (L) >60 mg/dL   VLDL Cholesterol Cal 43 (H) 5 - 40 mg/dL   LDL Chol Calc (NIH) 58 0 - 99 mg/dL  Testosterone, Free, Total, SHBG  Result Value Ref Range   Testosterone 107 (L) 264 - 916 ng/dL   Testosterone, Free 4.2 (L) 6.6 - 18.1 pg/mL   Sex Hormone Binding 13.1 (L) 19.3 - 76.4 nmol/L      Assessment & Plan:   Problem List Items Addressed This Visit      Cardiovascular and Mediastinum   Essential hypertension - Primary    BPs stable and WNL, continue current regimen      Relevant Orders   Comprehensive metabolic panel (Completed)     Respiratory   Asthma    Stable and well controlled, continue current regimen      Allergic rhinitis    Stable and well controlled, continue current regimen        Digestive   GERD (gastroesophageal reflux disease)    Will write for BID dosing per GI recommendations      Relevant Medications   pantoprazole (PROTONIX) 40 MG tablet     Endocrine   Type 2 diabetes mellitus with hyperglycemia (HCC)    Followed by Endocrinology, continue per their recommendations      Hypogonadism male    Check testosterone, if low will consider referral to Urology to restart supplementation if  Desired by pt        Nervous and Auditory   Chronic bilateral low back pain with bilateral sciatica    Stable and well controlld, continue current regimen        Other   RLS (restless legs syndrome)    Stable and well controlled, continue current regimen      Major depression    Stable and well controlled, continue current regimen      Hyperlipidemia    Recheck lipids, adjust as needed. Continue current regimen and work on lifestyle  habits      Relevant Orders   Lipid Panel w/o Chol/HDL Ratio (Completed)    Other Visit  Diagnoses    Hypogonadism in male       Relevant Orders   Testosterone, Free, Total, SHBG (Completed)       Follow up plan: Return in about 6 months (around 12/25/2019) for CPE.

## 2019-06-27 ENCOUNTER — Telehealth: Payer: Self-pay | Admitting: Family Medicine

## 2019-06-27 DIAGNOSIS — E291 Testicular hypofunction: Secondary | ICD-10-CM

## 2019-06-27 NOTE — Telephone Encounter (Signed)
Result note generated and sent through mychart. Please make sure he received

## 2019-06-27 NOTE — Telephone Encounter (Signed)
Has labs been reviewed yet?

## 2019-06-27 NOTE — Telephone Encounter (Signed)
Patient calling to check status of lab results from 5/24.

## 2019-06-28 LAB — COMPREHENSIVE METABOLIC PANEL
ALT: 37 IU/L (ref 0–44)
AST: 33 IU/L (ref 0–40)
Albumin/Globulin Ratio: 1.7 (ref 1.2–2.2)
Albumin: 4.1 g/dL (ref 3.8–4.8)
Alkaline Phosphatase: 135 IU/L — ABNORMAL HIGH (ref 48–121)
BUN/Creatinine Ratio: 14 (ref 10–24)
BUN: 15 mg/dL (ref 8–27)
Bilirubin Total: 0.5 mg/dL (ref 0.0–1.2)
CO2: 23 mmol/L (ref 20–29)
Calcium: 8.5 mg/dL — ABNORMAL LOW (ref 8.6–10.2)
Chloride: 99 mmol/L (ref 96–106)
Creatinine, Ser: 1.07 mg/dL (ref 0.76–1.27)
GFR calc Af Amer: 86 mL/min/{1.73_m2} (ref >59)
GFR calc non Af Amer: 74 mL/min/{1.73_m2} (ref >59)
Globulin, Total: 2.4 g/dL (ref 1.5–4.5)
Glucose: 171 mg/dL — ABNORMAL HIGH (ref 65–99)
Potassium: 4.2 mmol/L (ref 3.5–5.2)
Sodium: 138 mmol/L (ref 134–144)
Total Protein: 6.5 g/dL (ref 6.0–8.5)

## 2019-06-28 LAB — LIPID PANEL W/O CHOL/HDL RATIO
Cholesterol, Total: 135 mg/dL (ref 100–199)
HDL: 34 mg/dL — ABNORMAL LOW (ref >39)
LDL Chol Calc (NIH): 58 mg/dL (ref 0–99)
Triglycerides: 268 mg/dL — ABNORMAL HIGH (ref 0–149)
VLDL Cholesterol Cal: 43 mg/dL — ABNORMAL HIGH (ref 5–40)

## 2019-06-28 LAB — TESTOSTERONE, FREE, TOTAL, SHBG
Sex Hormone Binding: 13.1 nmol/L — ABNORMAL LOW (ref 19.3–76.4)
Testosterone, Free: 4.2 pg/mL — ABNORMAL LOW (ref 6.6–18.1)
Testosterone: 107 ng/dL — ABNORMAL LOW (ref 264–916)

## 2019-06-28 NOTE — Telephone Encounter (Signed)
Called and let patient know result message. Patient states he would like the referral to urology.

## 2019-07-01 ENCOUNTER — Telehealth: Payer: Self-pay

## 2019-07-01 NOTE — Assessment & Plan Note (Signed)
Stable and well controlled, continue current regimen 

## 2019-07-01 NOTE — Assessment & Plan Note (Signed)
Will write for BID dosing per GI recommendations

## 2019-07-01 NOTE — Assessment & Plan Note (Signed)
Followed by Endocrinology, continue per their recommendations.  

## 2019-07-01 NOTE — Assessment & Plan Note (Signed)
BPs stable and WNL, continue current regimen 

## 2019-07-01 NOTE — Assessment & Plan Note (Signed)
Recheck lipids, adjust as needed. Continue current regimen and work on lifestyle habits

## 2019-07-01 NOTE — Assessment & Plan Note (Signed)
Stable and well controlld, continue current regimen

## 2019-07-01 NOTE — Assessment & Plan Note (Signed)
Check testosterone, if low will consider referral to Urology to restart supplementation if  Desired by pt

## 2019-07-02 NOTE — Telephone Encounter (Signed)
Referral generated

## 2019-07-02 NOTE — Telephone Encounter (Signed)
Patient notified of referral. Stated that contacted him this morning.

## 2019-07-02 NOTE — Telephone Encounter (Signed)
Called patient to let him know about the referral. Some one answered the phone and stated he was not home. Will try to call pt later

## 2019-07-11 ENCOUNTER — Other Ambulatory Visit: Payer: Self-pay

## 2019-07-11 ENCOUNTER — Other Ambulatory Visit
Admission: RE | Admit: 2019-07-11 | Discharge: 2019-07-11 | Disposition: A | Discharge status: Home / Self Care | Payer: 59 | Source: Ambulatory Visit | Attending: Gastroenterology | Admitting: Gastroenterology

## 2019-07-11 DIAGNOSIS — Z20822 Contact with and (suspected) exposure to covid-19: Secondary | ICD-10-CM | POA: Diagnosis not present

## 2019-07-11 DIAGNOSIS — Z86010 Personal history of colonic polyps: Secondary | ICD-10-CM

## 2019-07-11 DIAGNOSIS — Z01812 Encounter for preprocedural laboratory examination: Secondary | ICD-10-CM | POA: Diagnosis present

## 2019-07-11 NOTE — Progress Notes (Signed)
Patient contacted office to request a copy of his colonoscopy instructions.  Instructions created and sent via mychart.  Patient prefers to pick up in office today.  States he already has his bowel prep.  Thanks,  [place], [place]

## 2019-07-12 LAB — SARS CORONAVIRUS 2 (TAT 6-24 HRS): SARS Coronavirus 2: NEGATIVE

## 2019-07-15 ENCOUNTER — Encounter: Admission: RE | Payer: Self-pay | Source: Home / Self Care

## 2019-07-15 ENCOUNTER — Ambulatory Visit: Admission: RE | Admit: 2019-07-15 | Payer: 59 | Source: Home / Self Care | Admitting: Gastroenterology

## 2019-07-15 SURGERY — COLONOSCOPY WITH PROPOFOL
Anesthesia: General

## 2019-07-17 ENCOUNTER — Ambulatory Visit (INDEPENDENT_AMBULATORY_CARE_PROVIDER_SITE_OTHER): Payer: 59 | Admitting: Urology

## 2019-07-17 ENCOUNTER — Encounter: Payer: Self-pay | Admitting: Urology

## 2019-07-17 ENCOUNTER — Other Ambulatory Visit: Payer: Self-pay

## 2019-07-17 VITALS — BP 132/78 | HR 80 | Ht 68.0 in | Wt 212.0 lb

## 2019-07-17 DIAGNOSIS — E291 Testicular hypofunction: Secondary | ICD-10-CM | POA: Diagnosis not present

## 2019-07-17 NOTE — Progress Notes (Signed)
07/02/19 1:22 PM   Corey [name] September 21, 1955 [id number]  Referring provider: Volney American, PA-C [address] [place],  [place] [address] Chief Complaint  Patient presents with  . Hypogonadism    HPI: Corey [name] is a 64 y.o. male who presents today at the request of Merrie Roof, PA-C for evaluation of hypogonadism.  -Referred by his PA due to low testosterone. Testosterone level on 06/24/2019 was 107 -previously on testosterone injections for 4-5 years and requested to come off 2 years ago  -Complains of decreased libido, tiredness and fatigue -No bothersome LUTS -Denies dysuria, gross hematuria -Denies ED   PMH: Past Medical History:  Diagnosis Date  . Asthma   . Diabetes ([place])   . GERD (gastroesophageal reflux disease)   . Hyperlipidemia   . Hypertension   . Legg-Perthes disease     Surgical History: Past Surgical History:  Procedure Laterality Date  . CARDIAC CATHETERIZATION    . COLONOSCOPY WITH PROPOFOL N/A 04/16/2019   Procedure: COLONOSCOPY WITH PROPOFOL;  Surgeon: Jonathon Bellows, MD;  Location: [facility] ENDOSCOPY;  Service: Gastroenterology;  Laterality: N/A;  . COLONOSCOPY WITH PROPOFOL N/A 06/14/2019   Procedure: COLONOSCOPY WITH PROPOFOL;  Surgeon: Jonathon Bellows, MD;  Location: [facility] ENDOSCOPY;  Service: Gastroenterology;  Laterality: N/A;  . ESOPHAGOGASTRODUODENOSCOPY (EGD) WITH PROPOFOL N/A 04/16/2019   Procedure: ESOPHAGOGASTRODUODENOSCOPY (EGD) WITH PROPOFOL;  Surgeon: Jonathon Bellows, MD;  Location: [facility] ENDOSCOPY;  Service: Gastroenterology;  Laterality: N/A;  . Foot Surgery    . HIP SURGERY    . RIGHT/LEFT HEART CATH AND CORONARY ANGIOGRAPHY N/A 07/25/2018   Procedure: RIGHT/LEFT HEART CATH AND CORONARY ANGIOGRAPHY;  Surgeon: Yolonda Kida, MD;  Location: [facility];  Service: Cardiovascular;  Laterality: N/A;    Home Medications:  Allergies as of 07/17/2019   No Known Allergies     Medication List       Accurate as of July 17, 2019   1:22 PM. If you have any questions, ask your nurse or doctor.        Accu-Chek [employer] Kit 1 Units by Does not apply route 2 (two) times daily.   Accu-Chek FastClix Lancets Misc USE TO CHECK BLOOD SUGAR   Accu-Chek Guide test strip Generic drug: glucose blood USE TO CHECK BLOOD SUGAR TID   Accu-Chek Guide w/Device Kit U UTD   acetaminophen 500 MG tablet Commonly known as: TYLENOL Take 1,000 mg by mouth every 6 (six) hours as needed for moderate pain or headache.   albuterol 108 (90 Base) MCG/ACT inhaler Commonly known as: VENTOLIN HFA INHALE 2 PUFFS INTO THE LUNGS EVERY 6 HOURS AS NEEDED FOR WHEEZING OR SHORTNESS OF BREATH   albuterol (2.5 MG/3ML) 0.083% nebulizer solution Commonly known as: PROVENTIL USE 1 VIAL IN NEBULIZER EVERY 6 HOURS - and as needed.   amitriptyline 50 MG tablet Commonly known as: ELAVIL TAKE 1 TO 2 TABLETS(50 TO 100 MG) BY MOUTH AT BEDTIME   aspirin EC 81 MG tablet Take 81 mg by mouth daily.   azelastine 0.1 % nasal spray Commonly known as: ASTELIN USE 2 SPRAYS IN EACH NOSTRIL TWICE DAILY   B-D UF III MINI PEN NEEDLES 31G X 5 MM Misc Generic drug: Insulin Pen Needle USE TWICE DAILY   buPROPion 150 MG 24 hr tablet Commonly known as: WELLBUTRIN XL TAKE 1 TABLET(150 MG) BY MOUTH DAILY   cetirizine 10 MG tablet Commonly known as: ZYRTEC Take 1 tablet (10 mg total) by mouth daily.   citalopram  10 MG tablet Commonly known as: CELEXA TAKE 1 TABLET BY MOUTH  DAILY   clopidogrel 75 MG tablet Commonly known as: PLAVIX Take by mouth.   diclofenac sodium 1 % Gel Commonly known as: VOLTAREN Apply 1 application topically 4 (four) times daily as needed (pain).   famotidine 40 MG tablet Commonly known as: PEPCID Take 40 mg by mouth daily.   Fluticasone-Salmeterol 500-50 MCG/DOSE Aepb Commonly known as: ADVAIR Inhale 1 puff into the lungs 2 (two) times daily.   furosemide 20 MG tablet Commonly known as: LASIX TAKE 1 TABLET(20 MG)  BY MOUTH TWICE DAILY   isosorbide mononitrate 60 MG 24 hr tablet Commonly known as: IMDUR Take 60 mg by mouth daily.   Jardiance 25 MG Tabs tablet Generic drug: empagliflozin Take 25 mg by mouth daily.   Lantus SoloStar 100 UNIT/ML Solostar Pen Generic drug: insulin glargine INJECT 75 UNITS [place] D. What changed: how much to take   losartan 50 MG tablet Commonly known as: COZAAR Take 50 mg by mouth daily.   magnesium gluconate 500 MG tablet Commonly known as: MAGONATE Take 500 mg by mouth daily.   meloxicam 7.5 MG tablet Commonly known as: MOBIC TAKE 1 TABLET(7.5 MG) BY MOUTH DAILY   metFORMIN 1000 MG tablet Commonly known as: GLUCOPHAGE TAKE 1 TABLET BY MOUTH  TWICE DAILY   metoprolol succinate 25 MG 24 hr tablet Commonly known as: TOPROL-XL Take 25 mg by mouth daily.   montelukast 10 MG tablet Commonly known as: SINGULAIR TAKE 1 TABLET(10 MG) BY MOUTH DAILY   nystatin cream Commonly known as: MYCOSTATIN Apply 1 application topically 2 (two) times daily.   Ozempic (1 MG/DOSE) 2 MG/1.5ML Sopn Generic drug: Semaglutide (1 MG/DOSE) Inject 1 mg into the skin once a week.   pantoprazole 40 MG tablet Commonly known as: PROTONIX Take 1 tablet (40 mg total) by mouth 2 (two) times daily before a meal.   pregabalin 150 MG capsule Commonly known as: Lyrica Take 1 capsule (150 mg total) by mouth 2 (two) times daily.   primidone 50 MG tablet Commonly known as: MYSOLINE TAKE 2 TABLETS(100 MG) BY MOUTH DAILY   rosuvastatin 20 MG tablet Commonly known as: CRESTOR Take 20 mg by mouth daily.   sucralfate 1 g tablet Commonly known as: CARAFATE TAKE 1 TABLET(1 GRAM) BY MOUTH THREE TIMES DAILY AS NEEDED   tiZANidine 4 MG tablet Commonly known as: Zanaflex Take 1 tablet (4 mg total) by mouth every 12 (twelve) hours as needed for muscle spasms.   Vitamin D3 125 MCG (5000 UT) Tabs Take 5,000 Units by mouth daily.       Allergies: No Known Allergies  Family  History: Family History  Problem Relation Age of Onset  . Cancer Mother   . Heart disease Mother   . Diabetes Father     Social History:  reports that he has quit smoking. His smoking use included cigarettes. He smoked 0.50 packs per day. His smokeless tobacco use includes chew. He reports previous alcohol use. He reports that he does not use drugs.   Physical Exam: BP 132/78   Pulse 80   Ht _0  (1.727 m)   Wt 212 lb (96.2 kg)   BMI 32.23 kg/m   Constitutional:  Alert and oriented, No acute distress. HEENT: [place] AT, moist mucus membranes.  Trachea midline, no masses. Cardiovascular: No clubbing, cyanosis, or edema. Respiratory: Normal respiratory effort, no increased work of breathing. GI: Abdomen is soft, nontender, nondistended, no abdominal  masses GU: Phallus is uncircumcised without lesions. Testes descend bilaterally. Small bilateral hydroceles. Skin: No rashes, bruises or suspicious lesions. Neurologic: Grossly intact, no focal deficits, moving all 4 extremities. Psychiatric: Normal mood and affect.   Assessment & Plan:     1.Hypogonadism  -Repeat testosterone level and ordered LH and prolactin. If second testosterone level low, will restart testosterone injections testosterone cypionate 200 mg every 2 weeks  -Does not need refresher training for injections  - Potential side effects of testosterone replacement were discussed including stimulation of benign prostatic growth with lower urinary tract symptoms; erythrocytosis; edema; gynecomastia; worsening sleep apnea; venous thromboembolism; testicular atrophy and infertility. Recent studies suggesting an increased incidence of heart attack and stroke in patients taking testosterone was discussed. He was informed there is conflicting evidence regarding the impact of testosterone therapy on cardiovascular risk. The theoretical risk of growth stimulation of an undetected prostate cancer was also discussed.  He was informed that  current evidence does not provide any definitive answers regarding the risks of testosterone therapy on prostate cancer and cardiovascular disease. The need for periodic monitoring of his testosterone level, PSA, hematocrit and DRE was discussed.  [facility] [address], [place], [place] [address] [phone]  I, Joneen Boers Peace, am acting as a Education administrator for Dr. Nicki Reaper C. Novalee Horsfall.  I have reviewed the above documentation for accuracy and completeness, and I agree with the above.   Abbie Sons, MD

## 2019-07-18 ENCOUNTER — Other Ambulatory Visit: Payer: Self-pay | Admitting: Urology

## 2019-07-18 ENCOUNTER — Other Ambulatory Visit: Payer: Self-pay | Admitting: Family Medicine

## 2019-07-18 ENCOUNTER — Telehealth: Payer: Self-pay | Admitting: *Deleted

## 2019-07-18 DIAGNOSIS — E291 Testicular hypofunction: Secondary | ICD-10-CM

## 2019-07-18 LAB — LUTEINIZING HORMONE: LH: 8.1 m[IU]/mL (ref 1.7–8.6)

## 2019-07-18 LAB — PROLACTIN: Prolactin: 9.4 ng/mL (ref 4.0–15.2)

## 2019-07-18 LAB — TESTOSTERONE: Testosterone: 120 ng/dL — ABNORMAL LOW (ref 264–916)

## 2019-07-18 MED ORDER — "NEEDLE (DISP) 18G X 1"" MISC": 0 refills | Status: AC

## 2019-07-18 MED ORDER — "LUER LOCK SAFETY SYRINGES 21G X 1-1/2"" 3 ML MISC": 0 refills | Status: AC

## 2019-07-18 MED ORDER — TESTOSTERONE CYPIONATE 200 MG/ML IM SOLN
200.0000 mg | INTRAMUSCULAR | 0 refills | Status: DC
Start: 2019-07-18 — End: 2019-09-08

## 2019-07-18 NOTE — Telephone Encounter (Signed)
-----   Message from Riki Altes, MD sent at 07/18/2019  8:03 AM EDT ----- Repeat testosterone level low at 120.  Pituitary hormone levels were normal.  Rx testosterone cypionate sent to pharmacy and may require prior authorization.  Schedule follow-up visit with testosterone level prior 4-6 weeks after he starts testosterone injections

## 2019-07-18 NOTE — Telephone Encounter (Signed)
Patient informed, voiced understanding. Lab and Follow up scheduled.

## 2019-07-19 ENCOUNTER — Other Ambulatory Visit: Payer: Self-pay

## 2019-07-19 DIAGNOSIS — Z86010 Personal history of colonic polyps: Secondary | ICD-10-CM

## 2019-07-19 MED ORDER — NA SULFATE-K SULFATE-MG SULF 17.5-3.13-1.6 GM/177ML PO SOLN: 1.0000 | Freq: Once | ORAL | 0 refills | Status: AC

## 2019-07-20 ENCOUNTER — Other Ambulatory Visit: Payer: Self-pay | Admitting: Family Medicine

## 2019-07-22 ENCOUNTER — Other Ambulatory Visit: Payer: Self-pay | Admitting: Family Medicine

## 2019-07-26 ENCOUNTER — Telehealth: Payer: Self-pay | Admitting: General Practice

## 2019-07-26 ENCOUNTER — Other Ambulatory Visit
Admission: RE | Admit: 2019-07-26 | Discharge: 2019-07-26 | Disposition: A | Discharge status: Home / Self Care | Payer: 59 | Source: Ambulatory Visit | Attending: Gastroenterology | Admitting: Gastroenterology

## 2019-07-26 ENCOUNTER — Ambulatory Visit (INDEPENDENT_AMBULATORY_CARE_PROVIDER_SITE_OTHER): Payer: 59 | Admitting: General Practice

## 2019-07-26 ENCOUNTER — Other Ambulatory Visit: Payer: Self-pay

## 2019-07-26 DIAGNOSIS — Z794 Long term (current) use of insulin: Secondary | ICD-10-CM | POA: Diagnosis not present

## 2019-07-26 DIAGNOSIS — Z01812 Encounter for preprocedural laboratory examination: Secondary | ICD-10-CM | POA: Insufficient documentation

## 2019-07-26 DIAGNOSIS — Z20822 Contact with and (suspected) exposure to covid-19: Secondary | ICD-10-CM | POA: Diagnosis not present

## 2019-07-26 DIAGNOSIS — E1165 Type 2 diabetes mellitus with hyperglycemia: Secondary | ICD-10-CM | POA: Diagnosis not present

## 2019-07-26 DIAGNOSIS — G894 Chronic pain syndrome: Secondary | ICD-10-CM

## 2019-07-26 DIAGNOSIS — I10 Essential (primary) hypertension: Secondary | ICD-10-CM | POA: Diagnosis not present

## 2019-07-26 DIAGNOSIS — E782 Mixed hyperlipidemia: Secondary | ICD-10-CM | POA: Diagnosis not present

## 2019-07-26 NOTE — Chronic Care Management (AMB) (Signed)
Chronic Care Management   Follow Up Note   07/26/2019 Name: MELROY BOUGHER MRN: [id number] DOB: 06/30/1955  Referred by: Volney American, PA-C Reason for referral : Chronic Care Management (Follow up: RNCM Chronic Disease Management and Care Coordination)   JATNIEL VERASTEGUI is a 64 y.o. year old male who is a primary care patient of Volney American, [place]. The CCM team was consulted for assistance with chronic disease management and care coordination needs.    Review of patient status, including review of consultants reports, relevant laboratory and other test results, and collaboration with appropriate care team members and the patient's provider was performed as part of comprehensive patient evaluation and provision of chronic care management services.    SDOH (Social Determinants of Health) assessments performed: Yes See Care Plan activities for detailed interventions related to [facility])     Outpatient Encounter Medications as of 07/26/2019  Medication Sig Note  . Accu-Chek FastClix Lancets MISC USE TO CHECK BLOOD SUGAR   . ACCU-CHEK GUIDE test strip USE TO CHECK BLOOD SUGAR TID   . acetaminophen (TYLENOL) 500 MG tablet Take 1,000 mg by mouth every 6 (six) hours as needed for moderate pain or headache.   . albuterol (PROVENTIL) (2.5 MG/3ML) 0.083% nebulizer solution USE 1 VIAL IN NEBULIZER EVERY 6 HOURS - and as needed. 04/03/2019: Using BID-TID  . albuterol (VENTOLIN HFA) 108 (90 Base) MCG/ACT inhaler INHALE 2 PUFFS INTO THE LUNGS EVERY 6 HOURS AS NEEDED FOR WHEEZING OR SHORTNESS OF BREATH   . amitriptyline (ELAVIL) 50 MG tablet TAKE 1 TO 2 TABLETS(50 TO 100 MG) BY MOUTH AT BEDTIME   . aspirin EC 81 MG tablet Take 81 mg by mouth daily.   Marland Kitchen azelastine (ASTELIN) 0.1 % nasal spray USE 2 SPRAYS IN EACH NOSTRIL TWICE DAILY   . B-D UF III MINI PEN NEEDLES 31G X 5 MM MISC USE TWICE DAILY   . Blood Glucose Monitoring Suppl (ACCU-CHEK GUIDE) w/Device KIT U UTD   . buPROPion  (WELLBUTRIN XL) 150 MG 24 hr tablet TAKE 1 TABLET(150 MG) BY MOUTH DAILY   . cetirizine (ZYRTEC) 10 MG tablet Take 1 tablet (10 mg total) by mouth daily.   . Cholecalciferol (VITAMIN D3) 5000 units TABS Take 5,000 Units by mouth daily.    . citalopram (CELEXA) 10 MG tablet TAKE 1 TABLET BY MOUTH  DAILY   . clopidogrel (PLAVIX) 75 MG tablet Take by mouth.   . diclofenac sodium (VOLTAREN) 1 % GEL Apply 1 application topically 4 (four) times daily as needed (pain).    . famotidine (PEPCID) 40 MG tablet Take 40 mg by mouth daily.   . Fluticasone-Salmeterol (ADVAIR) 500-50 MCG/DOSE AEPB Inhale 1 puff into the lungs 2 (two) times daily.   . furosemide (LASIX) 20 MG tablet TAKE 1 TABLET(20 MG) BY MOUTH TWICE DAILY   . isosorbide mononitrate (IMDUR) 60 MG 24 hr tablet Take 60 mg by mouth daily.   Marland Kitchen JARDIANCE 25 MG TABS tablet Take 25 mg by mouth daily.   . Lancets Misc. (ACCU-CHEK FASTCLIX LANCET) KIT 1 Units by Does not apply route 2 (two) times daily.   Marland Kitchen LANTUS SOLOSTAR 100 UNIT/ML Solostar Pen INJECT 75 UNITS [place] D. (Patient taking differently: 90 Units. INJECT 75 UNITS [place] D.)   . losartan (COZAAR) 50 MG tablet Take 50 mg by mouth daily.   . magnesium gluconate (MAGONATE) 500 MG tablet Take 500 mg by mouth daily.   . meloxicam (MOBIC) 7.5 MG tablet TAKE  1 TABLET(7.5 MG) BY MOUTH DAILY   . metFORMIN (GLUCOPHAGE) 1000 MG tablet TAKE 1 TABLET BY MOUTH  TWICE DAILY   . metoprolol succinate (TOPROL-XL) 25 MG 24 hr tablet Take 25 mg by mouth daily.   . montelukast (SINGULAIR) 10 MG tablet TAKE 1 TABLET(10 MG) BY MOUTH DAILY   . NEEDLE, DISP, 18 G 18G X 1" MISC Use as directed   . nystatin cream (MYCOSTATIN) Apply 1 application topically 2 (two) times daily.   . pantoprazole (PROTONIX) 40 MG tablet Take 1 tablet (40 mg total) by mouth 2 (two) times daily before a meal.   . pregabalin (LYRICA) 150 MG capsule Take 1 capsule (150 mg total) by mouth 2 (two) times daily.   . primidone (MYSOLINE) 50 MG tablet  TAKE 2 TABLETS(100 MG) BY MOUTH DAILY   . rosuvastatin (CRESTOR) 20 MG tablet Take 20 mg by mouth daily.   . Semaglutide, 1 MG/DOSE, (OZEMPIC, 1 MG/DOSE,) 2 MG/1.5ML SOPN Inject 1 mg into the skin once a week.   . sucralfate (CARAFATE) 1 g tablet TAKE 1 TABLET(1 GRAM) BY MOUTH THREE TIMES DAILY AS NEEDED   . SYRINGE-NEEDLE, DISP, 3 ML (LUER LOCK SAFETY SYRINGES) 21G X 1-1/2" 3 ML MISC Use as directed for testosterone administration   . testosterone cypionate (DEPOTESTOSTERONE CYPIONATE) 200 MG/ML injection Inject 1 mL (200 mg total) into the muscle every 14 (fourteen) days.   Marland Kitchen tiZANidine (ZANAFLEX) 4 MG tablet Take 1 tablet (4 mg total) by mouth every 12 (twelve) hours as needed for muscle spasms. 04/03/2019: Usually using QPm   No facility-administered encounter medications on file as of 07/26/2019.     Objective:   Goals Addressed              This Visit's Progress   .  RN-I have several health problems (pt-stated)        Current Barriers:  . Film/video editor.  . Chronic Disease Management support and education needs related to asthma, HTN, Angina, DMII and Chronic back pain related to degenerative disc and bilateral sciatica and the need for a scooter to maintain mobility and independence   Nurse Case Manager Clinical Goal(s):  Marland Kitchen Over the next 120 days, patient will work with [facility]  to address needs related to Managing multiple chronic disease processes   Interventions:  . Collaborated with PCP clinical staff  regarding patient's need for office visit to be evaluated specifically for POV/Scooter - the patient states that he has the scooter now and it is working well for him.  No new concerns at this time.  . All paperwork faxed including office notes to Otila Back at [facility] division. Fax # [phone] (done 01/2019) . Evaluate the patients needs for new request of wrist braces and ankle braces to help with the "arthritis" pain he is having- the patient states he has  not discussed this with the pcp or pain clinic.  The patient has these braces.  Now is requesting a shoulder brace. Instructed the patient to talk to the pcp about this at upcoming appointment on March 24-2021 at 10:15.  Denies any needs at this time related to braces. States he is doing well.  . Encouraged the patient to call his insurance company to find out about cost and also to utilize the OTC benefit catalog for standard braces. Reminder given.  . Advised the patient that he should speak to the pcp or pain clinic MD for recommendations on what to use to help with  the arthritis pain in his ankles,hands, and  shoulders . Educated on calling the provider for changes in level, intensity, or unresolved pain  Patient Self Care Activities:  . Patient verbalizes understanding of plan to obtain braces for hands/wrist and ankles . Unable to perform IADLs independently  Please see past updates related to this goal by clicking on the "Past Updates" button in the selected goal      .  RNCM-Pt-"I am taking my blood pressure at home" (pt-stated)        [place] (see longtitudinal plan of care for additional care plan information)  Current Barriers:  . Chronic Disease Management support, education, and care coordination needs related to HTN and HLD  Clinical Goal(s) related to HTN and HLD:  Over the next 120 days, patient will:  . Work with the care management team to address educational, disease management, and care coordination needs  . Begin or continue self health monitoring activities as directed today Measure and record blood pressure 3/4 times per week and adhere to a heart healthy/ADA diet . Call provider office for new or worsened signs and symptoms Blood pressure findings outside established parameters, Oxygen saturation lower than established parameter, Chest pain, Shortness of breath, and New or worsened symptom related to HLD and chronic conditions . Call care management team with  questions or concerns . Verbalize basic understanding of patient centered plan of care established today  Interventions related to HTN and HLD:  . Evaluation of current treatment plans and patient's adherence to plan as established by provider . Assessed patient understanding of disease states . Assessed patient's education and care coordination needs . Provided disease specific education to patient  . Collaborated with appropriate clinical care team members regarding patient needs  Patient Self Care Activities related to HTN and HLD:  . Patient is unable to independently self-manage chronic health conditions  Initial goal documentation     .  RNCM:"My sugar was up this am because I ate grapes" (pt-stated)        Current Barriers:  Marland Kitchen Knowledge Deficits related to negative impact on health and well being in a patient with chronic disease processes and not following a prescribed Heart healthy/ADA diet . Film/video editor.  . Transportation barriers  Nurse Case Manager Clinical Goal(s):  Marland Kitchen Over the next 120 days, patient will verbalize understanding of plan for following a Heart healthy/ADA diet . Over the next 120 days, patient will work with [place], pcp and CCM team to address needs related to dietary restrictions and following a heart healthy/ADA diet . Over the next 120 days, patient will demonstrate a decrease in hyperglycemic exacerbations as evidenced by decreased blood sugar readings and decrease hemoglobin A1C levels . Over the next 120 days, patient will demonstrate improved health management independence as evidenced bymaking healthy food choice, watching hidden salts and sugars, continuing to monitor blood sugars . Over the next 120 days, patient will work with CM team pharmacist to monitor medications  . Over the next 90 days, patient will work with CM clinical social worker to assist with resources as needed . Over the next 90 days, patient will work with care guides to get  adequate transportation  Interventions:  . Evaluation of current treatment plan related to DM and HTN dietary restrictions and patient's adherence to plan as established by provider. . Advised patient to monitor his dietary intake.  The patient says his blood sugars vary depending on what he eats. He ate BBQ sandwiches  last night, his blood sugar this am was 200.  Education and reiterated the importance of watching salts and sugars in meal planning. 07-26-2019: the patient states that his blood sugar this am was 130. Endorses taking blood sugars daily but did not have list available.  Encouraged the patient to write down readings to have for CCM calls and MD visits. . Provided education to patient re: to following a Heart Healthy/ADA diet.  The patient is aware of blood sugars being elevated and blood pressure elevation with use of high sodium foods.  Marland Kitchen Collaborated with pcp, CCM team regarding management of ADA . Discussed plans with patient for ongoing care management follow up and provided patient with direct contact information for care management team . Advised patient, providing education and rationale, to monitor blood pressure daily and record, calling pcp for findings outside established parameters.  . Advised patient, providing education and rationale, to check cbg bid and record, calling pcp for findings outside established parameters.   . Care Guide referral for transportation/phone- completed  Patient Self Care Activities:  . Patient verbalizes understanding of plan to monitor health and  well being by following prescribed diet for chronic health conditions . Attends all scheduled provider appointments . Calls provider office for new concerns or questions . Unable to independently manage chronic medical conditions . Does not adhere to provider recommendations re: heart healthy/ADA  Please see past updates related to this goal by clicking on the "Past Updates" button in the selected  goal          Plan:   The care management team will reach out to the patient again over the next 30 to 60 days.    Noreene Larsson RN, MSN, [facility] Mobile: [phone]

## 2019-07-26 NOTE — Patient Instructions (Signed)
Visit Information  Goals Addressed              This Visit's Progress   .  RN-I have several health problems (pt-stated)        Current Barriers:  . Corporate treasurer.  . Chronic Disease Management support and education needs related to asthma, HTN, Angina, DMII and Chronic back pain related to degenerative disc and bilateral sciatica and the need for a scooter to maintain mobility and independence   Nurse Case Manager Clinical Goal(s):  Marland Kitchen Over the next 120 days, patient will work with [facility]  to address needs related to Managing multiple chronic disease processes   Interventions:  . Collaborated with PCP clinical staff  regarding patient's need for office visit to be evaluated specifically for POV/Scooter - the patient states that he has the scooter now and it is working well for him.  No new concerns at this time.  . All paperwork faxed including office notes to Butler Denmark at [facility] division. Fax # [phone] (done 01/2019) . Evaluate the patients needs for new request of wrist braces and ankle braces to help with the "arthritis" pain he is having- the patient states he has not discussed this with the pcp or pain clinic.  The patient has these braces.  Now is requesting a shoulder brace. Instructed the patient to talk to the pcp about this at upcoming appointment on March 24-2021 at 10:15.  Denies any needs at this time related to braces. States he is doing well.  . Encouraged the patient to call his insurance company to find out about cost and also to utilize the OTC benefit catalog for standard braces. Reminder given.  . Advised the patient that he should speak to the pcp or pain clinic MD for recommendations on what to use to help with the arthritis pain in his ankles,hands, and  shoulders . Educated on calling the provider for changes in level, intensity, or unresolved pain  Patient Self Care Activities:  . Patient verbalizes understanding of plan to obtain braces  for hands/wrist and ankles . Unable to perform IADLs independently  Please see past updates related to this goal by clicking on the "Past Updates" button in the selected goal      .  RNCM-Pt-"I am taking my blood pressure at home" (pt-stated)        CARE PLAN ENTRY (see longtitudinal plan of care for additional care plan information)  Current Barriers:  . Chronic Disease Management support, education, and care coordination needs related to HTN and HLD  Clinical Goal(s) related to HTN and HLD:  Over the next 120 days, patient will:  . Work with the care management team to address educational, disease management, and care coordination needs  . Begin or continue self health monitoring activities as directed today Measure and record blood pressure 3/4 times per week and adhere to a heart healthy/ADA diet . Call provider office for new or worsened signs and symptoms Blood pressure findings outside established parameters, Oxygen saturation lower than established parameter, Chest pain, Shortness of breath, and New or worsened symptom related to HLD and chronic conditions . Call care management team with questions or concerns . Verbalize basic understanding of patient centered plan of care established today  Interventions related to HTN and HLD:  . Evaluation of current treatment plans and patient's adherence to plan as established by provider . Assessed patient understanding of disease states . Assessed patient's education and care coordination needs . Provided disease  specific education to patient  . Collaborated with appropriate clinical care team members regarding patient needs  Patient Self Care Activities related to HTN and HLD:  . Patient is unable to independently self-manage chronic health conditions  Initial goal documentation     .  RNCM:"My sugar was up this am because I ate grapes" (pt-stated)        Current Barriers:  Marland Kitchen Knowledge Deficits related to negative impact on health  and well being in a patient with chronic disease processes and not following a prescribed Heart healthy/ADA diet . Film/video editor.  . Transportation barriers  Nurse Case Manager Clinical Goal(s):  Marland Kitchen Over the next 120 days, patient will verbalize understanding of plan for following a Heart healthy/ADA diet . Over the next 120 days, patient will work with [place], pcp and CCM team to address needs related to dietary restrictions and following a heart healthy/ADA diet . Over the next 120 days, patient will demonstrate a decrease in hyperglycemic exacerbations as evidenced by decreased blood sugar readings and decrease hemoglobin A1C levels . Over the next 120 days, patient will demonstrate improved health management independence as evidenced bymaking healthy food choice, watching hidden salts and sugars, continuing to monitor blood sugars . Over the next 120 days, patient will work with CM team pharmacist to monitor medications  . Over the next 90 days, patient will work with CM clinical social worker to assist with resources as needed . Over the next 90 days, patient will work with care guides to get adequate transportation  Interventions:  . Evaluation of current treatment plan related to DM and HTN dietary restrictions and patient's adherence to plan as established by provider. . Advised patient to monitor his dietary intake.  The patient says his blood sugars vary depending on what he eats. He ate BBQ sandwiches last night, his blood sugar this am was 200.  Education and reiterated the importance of watching salts and sugars in meal planning. 07-26-2019: the patient states that his blood sugar this am was 130. Endorses taking blood sugars daily but did not have list available.  Encouraged the patient to write down readings to have for CCM calls and MD visits. . Provided education to patient re: to following a Heart Healthy/ADA diet.  The patient is aware of blood sugars being elevated and blood  pressure elevation with use of high sodium foods.  Marland Kitchen Collaborated with pcp, CCM team regarding management of ADA . Discussed plans with patient for ongoing care management follow up and provided patient with direct contact information for care management team . Advised patient, providing education and rationale, to monitor blood pressure daily and record, calling pcp for findings outside established parameters.  . Advised patient, providing education and rationale, to check cbg bid and record, calling pcp for findings outside established parameters.   . Care Guide referral for transportation/phone- completed  Patient Self Care Activities:  . Patient verbalizes understanding of plan to monitor health and  well being by following prescribed diet for chronic health conditions . Attends all scheduled provider appointments . Calls provider office for new concerns or questions . Unable to independently manage chronic medical conditions . Does not adhere to provider recommendations re: heart healthy/ADA  Please see past updates related to this goal by clicking on the "Past Updates" button in the selected goal         Patient verbalizes understanding of instructions provided today.   The care management team will reach out to the patient  again over the next 30 to 60 days.   Alto Denver RN, MSN, CCM Community Care Coordinator [place]  [facility] Mobile: [phone]

## 2019-07-27 LAB — SARS CORONAVIRUS 2 (TAT 6-24 HRS): SARS Coronavirus 2: NEGATIVE

## 2019-07-30 ENCOUNTER — Other Ambulatory Visit: Payer: Self-pay | Admitting: Family Medicine

## 2019-07-30 ENCOUNTER — Ambulatory Visit
Admission: RE | Admit: 2019-07-30 | Discharge: 2019-07-30 | Disposition: A | Discharge status: Home / Self Care | Payer: 59 | Source: Ambulatory Visit | Attending: Gastroenterology | Admitting: Gastroenterology

## 2019-07-30 ENCOUNTER — Encounter
Admission: RE | Disposition: A | Discharge status: Home / Self Care | Payer: Self-pay | Source: Ambulatory Visit | Attending: Gastroenterology

## 2019-07-30 ENCOUNTER — Ambulatory Visit: Payer: 59 | Admitting: Certified Registered Nurse Anesthetist

## 2019-07-30 ENCOUNTER — Encounter: Payer: Self-pay | Admitting: Gastroenterology

## 2019-07-30 ENCOUNTER — Other Ambulatory Visit: Payer: Self-pay

## 2019-07-30 DIAGNOSIS — Z79899 Other long term (current) drug therapy: Secondary | ICD-10-CM | POA: Insufficient documentation

## 2019-07-30 DIAGNOSIS — K5730 Diverticulosis of large intestine without perforation or abscess without bleeding: Secondary | ICD-10-CM | POA: Diagnosis not present

## 2019-07-30 DIAGNOSIS — Z955 Presence of coronary angioplasty implant and graft: Secondary | ICD-10-CM | POA: Diagnosis not present

## 2019-07-30 DIAGNOSIS — Z87891 Personal history of nicotine dependence: Secondary | ICD-10-CM | POA: Diagnosis not present

## 2019-07-30 DIAGNOSIS — E785 Hyperlipidemia, unspecified: Secondary | ICD-10-CM | POA: Insufficient documentation

## 2019-07-30 DIAGNOSIS — F329 Major depressive disorder, single episode, unspecified: Secondary | ICD-10-CM | POA: Insufficient documentation

## 2019-07-30 DIAGNOSIS — J45909 Unspecified asthma, uncomplicated: Secondary | ICD-10-CM | POA: Diagnosis not present

## 2019-07-30 DIAGNOSIS — Z8249 Family history of ischemic heart disease and other diseases of the circulatory system: Secondary | ICD-10-CM | POA: Insufficient documentation

## 2019-07-30 DIAGNOSIS — Z7982 Long term (current) use of aspirin: Secondary | ICD-10-CM | POA: Insufficient documentation

## 2019-07-30 DIAGNOSIS — Z794 Long term (current) use of insulin: Secondary | ICD-10-CM | POA: Diagnosis not present

## 2019-07-30 DIAGNOSIS — E119 Type 2 diabetes mellitus without complications: Secondary | ICD-10-CM | POA: Insufficient documentation

## 2019-07-30 DIAGNOSIS — I10 Essential (primary) hypertension: Secondary | ICD-10-CM | POA: Diagnosis not present

## 2019-07-30 DIAGNOSIS — M9110 Juvenile osteochondrosis of head of femur [Legg-Calve-Perthes], unspecified leg: Secondary | ICD-10-CM | POA: Insufficient documentation

## 2019-07-30 DIAGNOSIS — K219 Gastro-esophageal reflux disease without esophagitis: Secondary | ICD-10-CM | POA: Insufficient documentation

## 2019-07-30 DIAGNOSIS — Z791 Long term (current) use of non-steroidal anti-inflammatories (NSAID): Secondary | ICD-10-CM | POA: Insufficient documentation

## 2019-07-30 DIAGNOSIS — Z1211 Encounter for screening for malignant neoplasm of colon: Secondary | ICD-10-CM | POA: Diagnosis present

## 2019-07-30 DIAGNOSIS — Z86010 Personal history of colonic polyps: Secondary | ICD-10-CM

## 2019-07-30 DIAGNOSIS — Z7951 Long term (current) use of inhaled steroids: Secondary | ICD-10-CM | POA: Insufficient documentation

## 2019-07-30 HISTORY — PX: COLONOSCOPY WITH PROPOFOL: SHX5780

## 2019-07-30 LAB — GLUCOSE, CAPILLARY: Glucose-Capillary: 150 mg/dL — ABNORMAL HIGH (ref 70–99)

## 2019-07-30 SURGERY — COLONOSCOPY WITH PROPOFOL
Anesthesia: General

## 2019-07-30 MED ORDER — LIDOCAINE HCL (CARDIAC) PF 100 MG/5ML IV SOSY
PREFILLED_SYRINGE | INTRAVENOUS | Status: DC | PRN
  Administered 2019-07-30: 50 mg via INTRAVENOUS

## 2019-07-30 MED ORDER — PROPOFOL 10 MG/ML IV BOLUS
INTRAVENOUS | Status: DC | PRN
  Administered 2019-07-30: 70 mg via INTRAVENOUS

## 2019-07-30 MED ORDER — PROPOFOL 500 MG/50ML IV EMUL
INTRAVENOUS | Status: DC | PRN
  Administered 2019-07-30: 175 ug/kg/min via INTRAVENOUS

## 2019-07-30 MED ORDER — PROPOFOL 500 MG/50ML IV EMUL
INTRAVENOUS | Status: AC
  Filled 2019-07-30: qty 50

## 2019-07-30 MED ORDER — SODIUM CHLORIDE 0.9 % IV SOLN: INTRAVENOUS | Status: DC

## 2019-07-30 NOTE — Telephone Encounter (Signed)
Requested Prescriptions  Pending Prescriptions Disp Refills  . cetirizine (ZYRTEC) 10 MG tablet [Pharmacy Med Name: CETIRIZINE 10MG  TABLETS] 90 tablet 1    Sig: TAKE 1 TABLET BY MOUTH DAILY     Ear, Nose, and Throat:  Antihistamines Passed - 07/30/2019  4:48 PM      Passed - Valid encounter within last 12 months    Recent Outpatient Visits          1 month ago Essential hypertension   [facility], [place]icia Nearing   6 months ago Chronic bilateral low back pain with bilateral sciatica   [facility], [place] Nearing   7 months ago Type 2 diabetes mellitus with hyperglycemia, with long-term current use of insulin [facility])   [facility], [facility], Salley Hews   8 months ago Pain, dental   [facility] [place]   11 months ago Essential hypertension   [facility] [place], [place]      Future Appointments            In 1 month Stoioff, Redge Gainer, MD [facility]   In 5 months [facility], Maurice March, PA-C [facility]

## 2019-07-30 NOTE — Op Note (Signed)
[facility] Gastroenterology Patient Name: Corey [name] Procedure Date: 07/30/2019 10:01 AM MRN: [id number] Account #: [id number] Date of Birth: 03-Oct-1955 Admit Type: Outpatient Age: 64 Room: [facility] ENDO ROOM 1 Gender: Male Note Status: Finalized Procedure:             Colonoscopy Indications:           Screening for colorectal malignant neoplasm Providers:             Wyline Mood MD, MD Medicines:             Monitored Anesthesia Care Complications:         No immediate complications. Procedure:             Pre-Anesthesia Assessment:                        - Prior to the procedure, a History and Physical was                         performed, and patient medications, allergies and                         sensitivities were reviewed. The patient's tolerance                         of previous anesthesia was reviewed.                        - The risks and benefits of the procedure and the                         sedation options and risks were discussed with the                         patient. All questions were answered and informed                         consent was obtained.                        - ASA Grade Assessment: II - A patient with mild                         systemic disease.                        After obtaining informed consent, the colonoscope was                         passed under direct vision. Throughout the procedure,                         the patient's blood pressure, pulse, and oxygen                         saturations were monitored continuously. The                         Colonoscope was introduced through the anus and  advanced to the the cecum, identified by the                         appendiceal orifice. The colonoscopy was performed                         with ease. The patient tolerated the procedure well.                         The quality of the bowel preparation was poor. Findings:      The  perianal and digital rectal examinations were normal.      Multiple small-mouthed diverticula were found in the left colon.      The exam was otherwise without abnormality. Impression:            - Preparation of the colon was poor.                        - Diverticulosis in the left colon.                        - The examination was otherwise normal.                        - No specimens collected. Recommendation:        - Discharge patient to home (with escort).                        - Resume previous diet.                        - Continue present medications.                        - 3rd attempt at colonoscopy with poor prep - suggest                         annual FIT testing as an alternate option Procedure Code(s):     --- Professional ---                        [phone], Colonoscopy, flexible; diagnostic, including                         collection of specimen(s) by brushing or washing, when                         performed (separate procedure) Diagnosis Code(s):     --- Professional ---                        Z12.11, Encounter for screening for malignant neoplasm                         of colon                        K57.30, Diverticulosis of large intestine without                         perforation or abscess without bleeding CPT copyright  2019 American Medical Association. All rights reserved. The codes documented in this report are preliminary and upon coder review may  be revised to meet current compliance requirements. Wyline Mood, MD Wyline Mood MD, MD 07/30/2019 10:28:29 AM This report has been signed electronically. Number of Addenda: 0 Note Initiated On: 07/30/2019 10:01 AM Scope Withdrawal Time: 0 hours 10 minutes 20 seconds  Total Procedure Duration: 0 hours 11 minutes 56 seconds  Estimated Blood Loss:  Estimated blood loss: none.      [facility]

## 2019-07-30 NOTE — Anesthesia Procedure Notes (Signed)
Date/Time: 07/30/2019 10:10 AM Performed by: Ginger Carne, CRNA Pre-anesthesia Checklist: Patient identified, Emergency Drugs available, Suction available, Patient being monitored and Timeout performed Patient Re-evaluated:Patient Re-evaluated prior to induction Oxygen Delivery Method: Nasal cannula Preoxygenation: Pre-oxygenation with 100% oxygen Induction Type: IV induction

## 2019-07-30 NOTE — H&P (Signed)
Corey Bellows, MD [address] [place], [place], [place], [address] [address], [place], [place], [place], [address] Phone: [phone]  Fax: [phone]  Primary Care Physician:  Volney American, PA-C   Pre-Procedure History & Physical: HPI:  Corey [name] is a 64 y.o. male is here for an colonoscopy.   Past Medical History:  Diagnosis Date   Asthma    Diabetes ([place])    GERD (gastroesophageal reflux disease)    Hyperlipidemia    Hypertension    Legg-Perthes disease     Past Surgical History:  Procedure Laterality Date   CARDIAC CATHETERIZATION     COLONOSCOPY WITH PROPOFOL N/A 04/16/2019   Procedure: COLONOSCOPY WITH PROPOFOL;  Surgeon: Corey Bellows, MD;  Location: [facility] ENDOSCOPY;  Service: Gastroenterology;  Laterality: N/A;   COLONOSCOPY WITH PROPOFOL N/A 06/14/2019   Procedure: COLONOSCOPY WITH PROPOFOL;  Surgeon: Corey Bellows, MD;  Location: [facility] ENDOSCOPY;  Service: Gastroenterology;  Laterality: N/A;   ESOPHAGOGASTRODUODENOSCOPY (EGD) WITH PROPOFOL N/A 04/16/2019   Procedure: ESOPHAGOGASTRODUODENOSCOPY (EGD) WITH PROPOFOL;  Surgeon: Corey Bellows, MD;  Location: [facility] ENDOSCOPY;  Service: Gastroenterology;  Laterality: N/A;   Foot Surgery     HIP SURGERY     RIGHT/LEFT HEART CATH AND CORONARY ANGIOGRAPHY N/A 07/25/2018   Procedure: RIGHT/LEFT HEART CATH AND CORONARY ANGIOGRAPHY;  Surgeon: Yolonda Kida, MD;  Location: [place] CV LAB;  Service: Cardiovascular;  Laterality: N/A;    Prior to Admission medications   Medication Sig Start Date End Date Taking? Authorizing Provider  Accu-Chek FastClix Lancets MISC USE TO CHECK BLOOD SUGAR 11/23/18  Yes Merrie Roof [place], [place]  ACCU-CHEK GUIDE test strip USE TO CHECK BLOOD SUGAR TID 08/02/17  Yes Volney American, PA-C  acetaminophen (TYLENOL) 500 MG tablet Take 1,000 mg by mouth every 6 (six) hours as needed for moderate pain or headache.   Yes [provider]    albuterol (PROVENTIL) (2.5 MG/3ML) 0.083% nebulizer solution USE 1 VIAL IN NEBULIZER EVERY 6 HOURS - and as needed. 04/01/19  Yes Volney American, PA-C  albuterol (VENTOLIN HFA) 108 (90 Base) MCG/ACT inhaler INHALE 2 PUFFS INTO THE LUNGS EVERY 6 HOURS AS NEEDED FOR WHEEZING OR SHORTNESS OF BREATH 11/29/18  Yes Volney American, PA-C  amitriptyline (ELAVIL) 50 MG tablet TAKE 1 TO 2 TABLETS(50 TO 100 MG) BY MOUTH AT BEDTIME 04/07/19  Yes Volney American, PA-C  aspirin EC 81 MG tablet Take 81 mg by mouth daily.   Yes [provider]  azelastine (ASTELIN) 0.1 % nasal spray USE 2 SPRAYS IN EACH NOSTRIL TWICE DAILY 07/20/19  Yes Volney American, PA-C  B-D UF III MINI PEN NEEDLES 31G X 5 MM MISC USE TWICE DAILY 06/20/18  Yes Volney American, PA-C  Blood Glucose Monitoring Suppl (ACCU-CHEK GUIDE) w/Device KIT U UTD 06/02/17  Yes [provider]  buPROPion (WELLBUTRIN XL) 150 MG 24 hr tablet TAKE 1 TABLET(150 MG) BY MOUTH DAILY 06/02/19  Yes Volney American, PA-C  cetirizine (ZYRTEC) 10 MG tablet Take 1 tablet (10 mg total) by mouth daily. 01/21/19  Yes Volney American, PA-C  Cholecalciferol (VITAMIN D3) 5000 units TABS Take 5,000 Units by mouth daily.    Yes [provider]  citalopram (CELEXA) 10 MG tablet TAKE 1 TABLET BY MOUTH  DAILY 06/20/19  Yes Volney American, PA-C  clopidogrel (PLAVIX) 75 MG tablet Take by mouth. 09/05/18 09/05/19 Yes [provider]  diclofenac sodium (VOLTAREN) 1 % GEL  Apply 1 application topically 4 (four) times daily as needed (pain).    Yes [provider]  famotidine (PEPCID) 40 MG tablet Take 40 mg by mouth daily.   Yes [provider]  Fluticasone-Salmeterol (ADVAIR) 500-50 MCG/DOSE AEPB Inhale 1 puff into the lungs 2 (two) times daily. 04/30/18  Yes Volney American, PA-C  furosemide (LASIX) 20 MG tablet TAKE 1 TABLET(20 MG) BY MOUTH TWICE DAILY 07/22/19  Yes Volney American, PA-C  isosorbide mononitrate (IMDUR) 60 MG 24 hr tablet Take 60 mg by mouth daily. 03/29/19  Yes [provider]  JARDIANCE 25 MG TABS tablet Take 25 mg by mouth daily. 03/18/19  Yes [provider]  Lancets Misc. (ACCU-CHEK FASTCLIX LANCET) KIT 1 Units by Does not apply route 2 (two) times daily. 09/21/17  Yes Volney American, PA-C  LANTUS SOLOSTAR 100 UNIT/ML Solostar Pen INJECT 75 UNITS [place] D. Patient taking differently: 90 Units. INJECT 75 UNITS [place] D. 07/25/18  Yes Volney American, PA-C  losartan (COZAAR) 50 MG tablet Take 50 mg by mouth daily.   Yes [provider]  magnesium gluconate (MAGONATE) 500 MG tablet Take 500 mg by mouth daily.   Yes [provider]  meloxicam (MOBIC) 7.5 MG tablet TAKE 1 TABLET(7.5 MG) BY MOUTH DAILY 05/20/19  Yes Volney American, PA-C  metFORMIN (GLUCOPHAGE) 1000 MG tablet TAKE 1 TABLET BY MOUTH  TWICE DAILY 06/20/19  Yes Volney American, PA-C  metoprolol succinate (TOPROL-XL) 25 MG 24 hr tablet Take 25 mg by mouth daily.   Yes [provider]  montelukast (SINGULAIR) 10 MG tablet TAKE 1 TABLET(10 MG) BY MOUTH DAILY 07/18/19  Yes Duggin, Megan P, DO  NEEDLE, DISP, 18 G 18G X 1" MISC Use as directed 07/18/19  Yes Stoioff, Ronda Fairly, MD  nystatin cream (MYCOSTATIN) Apply 1 application topically 2 (two) times daily. 01/28/19  Yes Volney American, PA-C  pantoprazole (PROTONIX) 40 MG tablet Take 1 tablet (40 mg total) by mouth 2 (two) times daily before a meal. 06/24/19  Yes Volney American, PA-C  pregabalin (LYRICA) 150 MG capsule Take 1 capsule (150 mg total) by mouth 2 (two) times daily. 03/05/19  Yes Gillis Santa, MD  primidone (MYSOLINE) 50 MG tablet TAKE 2 TABLETS(100 MG) BY MOUTH DAILY 02/20/19  Yes Volney American, PA-C  rosuvastatin (CRESTOR) 20 MG tablet Take 20 mg by mouth daily.   Yes [provider]  Semaglutide, 1 MG/DOSE, (OZEMPIC, 1 MG/DOSE,) 2 MG/1.5ML  SOPN Inject 1 mg into the skin once a week.   Yes [provider]  sucralfate (CARAFATE) 1 g tablet TAKE 1 TABLET(1 GRAM) BY MOUTH THREE TIMES DAILY AS NEEDED 02/06/19  Yes Volney American, PA-C  SYRINGE-NEEDLE, DISP, 3 ML (LUER LOCK SAFETY SYRINGES) 21G X 1-1/2" 3 ML MISC Use as directed for testosterone administration 07/18/19  Yes Stoioff, Ronda Fairly, MD  testosterone cypionate (DEPOTESTOSTERONE CYPIONATE) 200 MG/ML injection Inject 1 mL (200 mg total) into the muscle every 14 (fourteen) days. 07/18/19  Yes Stoioff, Ronda Fairly, MD  tiZANidine (ZANAFLEX) 4 MG tablet Take 1 tablet (4 mg total) by mouth every 12 (twelve) hours as needed for muscle spasms. 03/05/19 09/01/19 Yes Gillis Santa, MD    Allergies as of 07/19/2019   (No Known Allergies)    Family History  Problem Relation Age of Onset   Cancer Mother    Heart disease Mother    Diabetes Father     Social History  Socioeconomic History   Marital status: Divorced    Spouse name: Not on file   Number of children: Not on file   Years of education: Not on file   Highest education level: 8th grade  Occupational History   Not on file  Tobacco Use   Smoking status: Former Smoker    Packs/day: 0.50    Types: Cigarettes   Smokeless tobacco: Current User    Types: Chew   Tobacco comment: quit 40 years ago   Vaping Use   Vaping Use: Never used  Substance and Sexual Activity   Alcohol use: Not Currently   Drug use: Never   Sexual activity: Not on file  Other Topics Concern   Not on file  Social History Narrative   Not on file   Social Determinants of Health   Financial Resource Strain: Medium Risk   Difficulty of Paying Living Expenses: Somewhat hard  Food Insecurity: No Food Insecurity   Worried About Charity fundraiser in the Last Year: Never true   Ran Out of Food in the Last Year: Never true  Transportation Needs: No Transportation Needs   Lack of Transportation (Medical): No   Lack of  Transportation (Non-Medical): No  Physical Activity:    Days of Exercise per Week:    Minutes of Exercise per Session:   Stress: No Stress Concern Present   Feeling of Stress : Not at all  Social Connections:    Frequency of Communication with Friends and Family:    Frequency of Social Gatherings with Friends and Family:    Attends Religious Services:    Active Member of Clubs or Organizations:    Attends Music therapist:    Marital Status:   Intimate Partner Violence:    Fear of Current or Ex-Partner:    Emotionally Abused:    Physically Abused:    Sexually Abused:     Review of Systems: See HPI, otherwise negative ROS  Physical Exam: BP 129/77    Pulse 81    Temp (!) 97.4 F (36.3 C) (Temporal)    Resp 18    Ht 5' 8"  (1.727 m)    Wt 100.7 kg    SpO2 96%    BMI 33.75 kg/m  General:   Alert,  pleasant and cooperative in NAD Head:  Normocephalic and atraumatic. Neck:  Supple; no masses or thyromegaly. Lungs:  Clear throughout to auscultation, normal respiratory effort.    Heart:  +S1, +S2, Regular rate and rhythm, No edema. Abdomen:  Soft, nontender and nondistended. Normal bowel sounds, without guarding, and without rebound.   Neurologic:  Alert and  oriented x4;  grossly normal neurologically.  Impression/Plan: Corey [name] is here for an colonoscopy to be performed for Screening colonoscopy average risk   Risks, benefits, limitations, and alternatives regarding  colonoscopy have been reviewed with the patient.  Questions have been answered.  All parties agreeable.   Corey Bellows, MD  07/30/2019, 10:02 AM

## 2019-07-30 NOTE — Anesthesia Preprocedure Evaluation (Signed)
Anesthesia Evaluation  Patient identified by MRN, date of birth, ID band Patient awake    Reviewed: Allergy & Precautions, NPO status , Patient's Chart, lab work & pertinent test results  History of Anesthesia Complications Negative for: history of anesthetic complications  Airway Mallampati: III  TM Distance: >3 FB Neck ROM: Full    Dental no notable dental hx. (+) Poor Dentition   Pulmonary asthma , neg sleep apnea, neg COPD, Patient abstained from smoking.Not current smoker, former smoker,    Pulmonary exam normal breath sounds clear to auscultation       Cardiovascular Exercise Tolerance: Good METShypertension, Pt. on medications + Cardiac Stents  (-) CAD and (-) Past MI (-) dysrhythmias  Rhythm:Regular Rate:Normal - Systolic murmurs S/p stent placement, unable to recall exact date, but sometime between June and December 2020.  Cath 07/2018:  Prox LAD lesion is 75% stenosed.  1st Diag lesion is 50% stenosed.  Prox LAD to Mid LAD lesion is 90% stenosed.  Prox RCA to Mid RCA lesion is 10% stenosed.  LV end diastolic pressure is normal.   Conclusion Normal overall left ventricular function of 55% Complex proximal LAD lesion involving the first diagonal with a 111 bifurcation lesion 75% pre-bifurcation 90% post sidebranch and 50% ostial sidebranch diagonal 1 RCA was free of disease in the right dominant system Intervention was deferred    Neuro/Psych  Headaches, PSYCHIATRIC DISORDERS Depression    GI/Hepatic GERD  ,(+)     (-) substance abuse  ,   Endo/Other  diabetes, Type 2, Insulin Dependent  Renal/GU negative Renal ROS     Musculoskeletal   Abdominal   Peds  Hematology   Anesthesia Other Findings Past Medical History: No date: Asthma No date: Diabetes (HCC) No date: GERD (gastroesophageal reflux disease) No date: Hyperlipidemia No date: Hypertension No date: Legg-Perthes disease   Reproductive/Obstetrics                             Anesthesia Physical  Anesthesia Plan  ASA: III  Anesthesia Plan: General   Post-op Pain Management:    Induction: Intravenous  PONV Risk Score and Plan: 2 and Propofol infusion and TIVA  Airway Management Planned: Nasal Cannula and Natural Airway  Additional Equipment: None  Intra-op Plan:   Post-operative Plan:   Informed Consent: I have reviewed the patients History and Physical, chart, labs and discussed the procedure including the risks, benefits and alternatives for the proposed anesthesia with the patient or authorized representative who has indicated his/her understanding and acceptance.     Dental advisory given  Plan Discussed with: CRNA and Surgeon  Anesthesia Plan Comments: (Discussed risks of anesthesia with patient, including possibility of difficulty with spontaneous ventilation under anesthesia necessitating airway intervention, PONV, and rare risks such as cardiac or respiratory or neurological events. Patient understands. Patient counseled on being higher risk for anesthesia due to comorbidities: recent cardiac stent placement <6 months ago. Patient was told about increased risk of cardiac and respiratory events, including death. Patient understands. )        Anesthesia Quick Evaluation

## 2019-07-30 NOTE — Anesthesia Postprocedure Evaluation (Signed)
Anesthesia Post Note  Patient: Corey [name]  Procedure(s) Performed: COLONOSCOPY WITH PROPOFOL (N/A )  Patient location during evaluation: Endoscopy Anesthesia Type: General Level of consciousness: awake and alert Pain management: pain level controlled Vital Signs Assessment: post-procedure vital signs reviewed and stable Respiratory status: spontaneous breathing, nonlabored ventilation, respiratory function stable and patient connected to nasal cannula oxygen Cardiovascular status: blood pressure returned to baseline and stable Postop Assessment: no apparent nausea or vomiting Anesthetic complications: no   No complications documented.   Last Vitals:  Vitals:   07/30/19 1040 07/30/19 1050  BP: 108/69 106/64  Pulse: 75 75  Resp: 15 13  Temp:    SpO2: 98% 96%    Last Pain:  Vitals:   07/30/19 1030  TempSrc: Temporal  PainSc:                  Lenard Simmer

## 2019-07-30 NOTE — Transfer of Care (Signed)
Immediate Anesthesia Transfer of Care Note  Patient: Corey [name]  Procedure(s) Performed: COLONOSCOPY WITH PROPOFOL (N/A )  Patient Location: PACU  Anesthesia Type:General  Level of Consciousness: sedated  Airway & Oxygen Therapy: Patient Spontanous Breathing  Post-op Assessment: Report given to RN and Post -op Vital signs reviewed and stable  Post vital signs: Reviewed and stable  Last Vitals:  Vitals Value Taken Time  BP 109/68 07/30/19 1033  Temp 36.3 C 07/30/19 1030  Pulse 81 07/30/19 1033  Resp 13 07/30/19 1033  SpO2 94 % 07/30/19 1033  Vitals shown include unvalidated device data.  Last Pain:  Vitals:   07/30/19 1030  TempSrc: Temporal  PainSc:          Complications: No complications documented.

## 2019-07-31 ENCOUNTER — Encounter: Payer: Self-pay | Admitting: Gastroenterology

## 2019-08-18 ENCOUNTER — Other Ambulatory Visit: Payer: Self-pay | Admitting: Student in an Organized Health Care Education/Training Program

## 2019-08-18 DIAGNOSIS — M533 Sacrococcygeal disorders, not elsewhere classified: Secondary | ICD-10-CM

## 2019-08-21 ENCOUNTER — Telehealth: Payer: Self-pay

## 2019-08-23 ENCOUNTER — Telehealth: Payer: Self-pay

## 2019-08-28 ENCOUNTER — Other Ambulatory Visit: Payer: Self-pay

## 2019-08-28 ENCOUNTER — Other Ambulatory Visit: Payer: 59

## 2019-08-28 DIAGNOSIS — E291 Testicular hypofunction: Secondary | ICD-10-CM

## 2019-08-29 ENCOUNTER — Telehealth: Payer: Self-pay | Admitting: *Deleted

## 2019-08-29 ENCOUNTER — Encounter: Payer: 59 | Admitting: Student in an Organized Health Care Education/Training Program

## 2019-08-29 LAB — TESTOSTERONE: Testosterone: 60 ng/dL — ABNORMAL LOW (ref 264–916)

## 2019-08-29 NOTE — Telephone Encounter (Signed)
-----   Message from Riki Altes, MD sent at 08/29/2019 12:18 PM EDT ----- Testosterone level is low at 60.  Did he start injections?

## 2019-08-30 ENCOUNTER — Ambulatory Visit (INDEPENDENT_AMBULATORY_CARE_PROVIDER_SITE_OTHER): Payer: 59 | Admitting: Urology

## 2019-08-30 ENCOUNTER — Encounter: Payer: Self-pay | Admitting: Urology

## 2019-08-30 ENCOUNTER — Other Ambulatory Visit: Payer: Self-pay

## 2019-08-30 VITALS — BP 131/53 | HR 90 | Ht 68.0 in | Wt 221.0 lb

## 2019-08-30 DIAGNOSIS — E291 Testicular hypofunction: Secondary | ICD-10-CM

## 2019-08-30 NOTE — Progress Notes (Signed)
08/30/2019 11:15 AM   Lenna Sciara [birth year] [id number]  Referring provider: Volney American, PA-C [address] [place],  [place] [address]  Chief Complaint  Patient presents with  . Hypogonadism    HPI: 64 y.o. male presents for follow-up of hypogonadism.   Started TRT approximately 6 weeks ago  Injecting 200 mg every 2 weeks  Has not seen improvement in symptoms  Injected today  Testosterone level 7/28 was 60 ng/dL   PMH: Past Medical History:  Diagnosis Date  . Asthma   . Diabetes ([place])   . GERD (gastroesophageal reflux disease)   . Hyperlipidemia   . Hypertension   . Legg-Perthes disease     Surgical History: Past Surgical History:  Procedure Laterality Date  . CARDIAC CATHETERIZATION    . COLONOSCOPY WITH PROPOFOL N/A 04/16/2019   Procedure: COLONOSCOPY WITH PROPOFOL;  Surgeon: Jonathon Bellows, MD;  Location: [facility] ENDOSCOPY;  Service: Gastroenterology;  Laterality: N/A;  . COLONOSCOPY WITH PROPOFOL N/A 06/14/2019   Procedure: COLONOSCOPY WITH PROPOFOL;  Surgeon: Jonathon Bellows, MD;  Location: [facility] ENDOSCOPY;  Service: Gastroenterology;  Laterality: N/A;  . COLONOSCOPY WITH PROPOFOL N/A 07/30/2019   Procedure: COLONOSCOPY WITH PROPOFOL;  Surgeon: Jonathon Bellows, MD;  Location: [facility] ENDOSCOPY;  Service: Gastroenterology;  Laterality: N/A;  . ESOPHAGOGASTRODUODENOSCOPY (EGD) WITH PROPOFOL N/A 04/16/2019   Procedure: ESOPHAGOGASTRODUODENOSCOPY (EGD) WITH PROPOFOL;  Surgeon: Jonathon Bellows, MD;  Location: [facility] ENDOSCOPY;  Service: Gastroenterology;  Laterality: N/A;  . Foot Surgery    . HIP SURGERY    . RIGHT/LEFT HEART CATH AND CORONARY ANGIOGRAPHY N/A 07/25/2018   Procedure: RIGHT/LEFT HEART CATH AND CORONARY ANGIOGRAPHY;  Surgeon: Yolonda Kida, MD;  Location: [facility] CV LAB;  Service: Cardiovascular;  Laterality: N/A;    Home Medications:  Allergies as of 08/30/2019   No Known Allergies     Medication List       Accurate as of August 30, 2019 11:15  AM. If you have any questions, ask your nurse or doctor.        Accu-Chek [employer] Kit 1 Units by Does not apply route 2 (two) times daily.   Accu-Chek Fast[employer]ix Lancets Misc USE TO CHECK BLOOD SUGAR   Accu-Chek Guide test strip Generic drug: glucose blood USE TO CHECK BLOOD SUGAR TID   Accu-Chek Guide w/Device Kit U UTD   acetaminophen 500 MG tablet Commonly known as: TYLENOL Take 1,000 mg by mouth every 6 (six) hours as needed for moderate pain or headache.   albuterol 108 (90 Base) MCG/ACT inhaler Commonly known as: VENTOLIN HFA INHALE 2 PUFFS INTO THE LUNGS EVERY 6 HOURS AS NEEDED FOR WHEEZING OR SHORTNESS OF BREATH   albuterol (2.5 MG/3ML) 0.083% nebulizer solution Commonly known as: PROVENTIL USE 1 VIAL IN NEBULIZER EVERY 6 HOURS - and as needed.   amitriptyline 50 MG tablet Commonly known as: ELAVIL TAKE 1 TO 2 TABLETS(50 TO 100 MG) BY MOUTH AT BEDTIME   aspirin EC 81 MG tablet Take 81 mg by mouth daily.   azelastine 0.1 % nasal spray Commonly known as: ASTELIN USE 2 SPRAYS IN EACH NOSTRIL TWICE DAILY   B-D UF III MINI PEN NEEDLES 31G X 5 MM Misc Generic drug: Insulin Pen Needle USE TWICE DAILY   buPROPion 150 MG 24 hr tablet Commonly known as: WELLBUTRIN XL TAKE 1 TABLET(150 MG) BY MOUTH DAILY   cetirizine 10 MG tablet Commonly known as: ZYRTEC TAKE 1 TABLET BY MOUTH DAILY   citalopram 10 MG tablet Commonly  known as: CELEXA TAKE 1 TABLET BY MOUTH  DAILY   clopidogrel 75 MG tablet Commonly known as: PLAVIX Take by mouth.   diclofenac sodium 1 % Gel Commonly known as: VOLTAREN Apply 1 application topically 4 (four) times daily as needed (pain).   famotidine 40 MG tablet Commonly known as: PEPCID Take 40 mg by mouth daily.   Fluticasone-Salmeterol 500-50 MCG/DOSE Aepb Commonly known as: ADVAIR Inhale 1 puff into the lungs 2 (two) times daily.   furosemide 20 MG tablet Commonly known as: LASIX TAKE 1 TABLET(20 MG) BY MOUTH TWICE  DAILY   isosorbide mononitrate 60 MG 24 hr tablet Commonly known as: IMDUR Take 60 mg by mouth daily.   Jardiance 25 MG Tabs tablet Generic drug: empagliflozin Take 25 mg by mouth daily.   Lantus SoloStar 100 UNIT/ML Solostar Pen Generic drug: insulin glargine INJECT 75 UNITS [place] D. What changed: how much to take   losartan 50 MG tablet Commonly known as: COZAAR Take 50 mg by mouth daily.   Luer Lock Safety Syringes 21G X 1-1/2" 3 ML Misc Generic drug: SYRINGE-NEEDLE (DISP) 3 ML Use as directed for testosterone administration   magnesium gluconate 500 MG tablet Commonly known as: MAGONATE Take 500 mg by mouth daily.   meloxicam 7.5 MG tablet Commonly known as: MOBIC TAKE 1 TABLET(7.5 MG) BY MOUTH DAILY   metFORMIN 1000 MG tablet Commonly known as: GLUCOPHAGE TAKE 1 TABLET BY MOUTH  TWICE DAILY   metoprolol succinate 25 MG 24 hr tablet Commonly known as: TOPROL-XL Take 25 mg by mouth daily.   montelukast 10 MG tablet Commonly known as: SINGULAIR TAKE 1 TABLET(10 MG) BY MOUTH DAILY   NEEDLE (DISP) 18 G 18G X 1" Misc Use as directed   nystatin cream Commonly known as: MYCOSTATIN Apply 1 application topically 2 (two) times daily.   Ozempic (1 MG/DOSE) 2 MG/1.5ML Sopn Generic drug: Semaglutide (1 MG/DOSE) Inject 1 mg into the skin once a week.   pantoprazole 40 MG tablet Commonly known as: PROTONIX Take 1 tablet (40 mg total) by mouth 2 (two) times daily before a meal.   pregabalin 150 MG capsule Commonly known as: Lyrica Take 1 capsule (150 mg total) by mouth 2 (two) times daily.   primidone 50 MG tablet Commonly known as: MYSOLINE TAKE 2 TABLETS(100 MG) BY MOUTH DAILY   rosuvastatin 20 MG tablet Commonly known as: CRESTOR Take 20 mg by mouth daily.   sucralfate 1 g tablet Commonly known as: CARAFATE TAKE 1 TABLET(1 GRAM) BY MOUTH THREE TIMES DAILY AS NEEDED   testosterone cypionate 200 MG/ML injection Commonly known as: DEPOTESTOSTERONE  CYPIONATE Inject 1 mL (200 mg total) into the muscle every 14 (fourteen) days.   tiZANidine 4 MG tablet Commonly known as: Zanaflex Take 1 tablet (4 mg total) by mouth every 12 (twelve) hours as needed for muscle spasms.   Vitamin D3 125 MCG (5000 UT) Tabs Take 5,000 Units by mouth daily.       Allergies: No Known Allergies  Family History: Family History  Problem Relation Age of Onset  . Cancer Mother   . Heart disease Mother   . Diabetes Father     Social History:  reports that he has quit smoking. His smoking use included cigarettes. He smoked 0.50 packs per day. His smokeless tobacco use includes chew. He reports previous alcohol use. He reports that he does not use drugs.   Physical Exam: BP (!) 131/53   Pulse 90   Ht 5'  8" (1.727 m)   Wt (!) 221 lb (100.2 kg)   BMI 33.60 kg/m   Constitutional:  Alert and oriented, No acute distress. HEENT: [place] AT, moist mucus membranes.  Trachea midline, no masses. Cardiovascular: No clubbing, cyanosis, or edema. Respiratory: Normal respiratory effort, no increased work of breathing. Skin: No rashes, bruises or suspicious lesions. Neurologic: Grossly intact, no focal deficits, moving all 4 extremities. Psychiatric: Normal mood and affect.   Assessment & Plan:    1.  Hypogonadism  Trough testosterone level low on replacement  We will check a midcycle testosterone level next week prior to adjusting dose/interval   Abbie Sons, MD  [facility] [address] [place], [place] [address] [phone]

## 2019-09-05 ENCOUNTER — Other Ambulatory Visit: Payer: Self-pay

## 2019-09-05 ENCOUNTER — Other Ambulatory Visit: Payer: 59

## 2019-09-05 ENCOUNTER — Other Ambulatory Visit: Payer: Self-pay | Admitting: Family Medicine

## 2019-09-05 ENCOUNTER — Encounter: Payer: 59 | Admitting: Student in an Organized Health Care Education/Training Program

## 2019-09-05 DIAGNOSIS — E291 Testicular hypofunction: Secondary | ICD-10-CM

## 2019-09-06 ENCOUNTER — Telehealth: Payer: Self-pay | Admitting: Urology

## 2019-09-06 ENCOUNTER — Telehealth: Payer: Self-pay | Admitting: General Practice

## 2019-09-06 ENCOUNTER — Ambulatory Visit (INDEPENDENT_AMBULATORY_CARE_PROVIDER_SITE_OTHER): Payer: 59 | Admitting: General Practice

## 2019-09-06 DIAGNOSIS — I10 Essential (primary) hypertension: Secondary | ICD-10-CM

## 2019-09-06 DIAGNOSIS — E782 Mixed hyperlipidemia: Secondary | ICD-10-CM | POA: Diagnosis not present

## 2019-09-06 DIAGNOSIS — E1165 Type 2 diabetes mellitus with hyperglycemia: Secondary | ICD-10-CM | POA: Diagnosis not present

## 2019-09-06 DIAGNOSIS — Z794 Long term (current) use of insulin: Secondary | ICD-10-CM

## 2019-09-06 LAB — TESTOSTERONE: Testosterone: 307 ng/dL (ref 264–916)

## 2019-09-06 NOTE — Patient Instructions (Signed)
Visit Information  Goals Addressed              This Visit's Progress     COMPLETED: RN-I have several health problems (pt-stated)        Current Barriers: Goal completed. Some barriers have been transferred to new goals. See updated plan of care.   Corporate treasurer.   Chronic Disease Management support and education needs related to asthma, HTN, Angina, DMII and Chronic back pain related to degenerative disc and bilateral sciatica and the need for a scooter to maintain mobility and independence   Nurse Case Manager Clinical Goal(s):   Over the next 120 days, patient will work with [facility]  to address needs related to Managing multiple chronic disease processes   Interventions:   Collaborated with PCP clinical staff  regarding patient's need for office visit to be evaluated specifically for POV/Scooter - the patient states that he has the scooter now and it is working well for him.  No new concerns at this time.   All paperwork faxed including office notes to Butler Denmark at [facility] division. Fax # [phone] (done 01/2019)  Evaluate the patients needs for new request of wrist braces and ankle braces to help with the "arthritis" pain he is having- the patient states he has not discussed this with the pcp or pain clinic.  The patient has these braces.  Now is requesting a shoulder brace. Instructed the patient to talk to the pcp about this at upcoming appointment on March 24-2021 at 10:15.  Denies any needs at this time related to braces. States he is doing well.   Encouraged the patient to call his insurance company to find out about cost and also to utilize the OTC benefit catalog for standard braces. Reminder given.   Advised the patient that he should speak to the pcp or pain clinic MD for recommendations on what to use to help with the arthritis pain in his ankles,hands, and  shoulders  Educated on calling the provider for changes in level, intensity, or unresolved  pain  Patient Self Care Activities:   Patient verbalizes understanding of plan to obtain braces for hands/wrist and ankles  Unable to perform IADLs independently  Please see past updates related to this goal by clicking on the "Past Updates" button in the selected goal        RNCM-Pt-"I am taking my blood pressure at home" (pt-stated)        CARE PLAN ENTRY (see longtitudinal plan of care for additional care plan information)  Current Barriers:   Chronic Disease Management support, education, and care coordination needs related to HTN and HLD  Clinical Goal(s) related to HTN and HLD:  Over the next 120 days, patient will:   Work with the care management team to address educational, disease management, and care coordination needs   Begin or continue self health monitoring activities as directed today Measure and record blood pressure 3/4 times per week and adhere to a heart healthy/ADA diet  Call provider office for new or worsened signs and symptoms Blood pressure findings outside established parameters, Oxygen saturation lower than established parameter, Chest pain, Shortness of breath, and New or worsened symptom related to HLD and chronic conditions  Call care management team with questions or concerns  Verbalize basic understanding of patient centered plan of care established today  Interventions related to HTN and HLD:   Evaluation of current treatment plans and patient's adherence to plan as established by provider.  States  compliance with medications and plan of care.  Assessed patient understanding of disease states.  Review of blood pressure readings. The patient did not have any readings for the [facility] but states it is "good". Education on taking blood pressures regularly and recording, having list available at [employer].   Assessed patient's education and care coordination needs.  The patient needs consistent reminders to follow plan of care for health and wellness.  The patient had a colonoscopy in July but he was not cleaned out good so they were not able to have a good test. The patient does not have follow up with the provider. Education on reaching out to specialist to see what the plan of care is concerning his GI health.   Provided disease specific education to patient.  Education and support on Heart heatlhy/ADA diet, taking bp and glucose readings consistently and writing them down. The patient denies any issues at this time. Went for lab work on 09/05/2019. Will continue to monitor.    Collaborated with appropriate clinical care team members regarding patient needs.  Ongoing support from [facility] team pharmacist and LCSW.   Patient Self Care Activities related to HTN and HLD:   Patient is unable to independently self-manage chronic health conditions  Please see past updates related to this goal by clicking on the "Past Updates" button in the selected goal        RNCM:"My sugar was up this am because I ate grapes" (pt-stated)        Current Barriers:   Knowledge Deficits related to negative impact on health and well being in a patient with chronic disease processes and not following a prescribed Heart healthy/ADA diet  Financial Constraints.   Transportation barriers  Nurse Case Manager Clinical Goal(s):   Over the next 120 days, patient will verbalize understanding of plan for following a Heart healthy/ADA diet  Over the next 120 days, patient will work with RNCM, pcp and CCM team to address needs related to dietary restrictions and following a heart healthy/ADA diet  Over the next 120 days, patient will demonstrate a decrease in hyperglycemic exacerbations as evidenced by decreased blood sugar readings and decrease hemoglobin A1C levels  Over the next 120 days, patient will demonstrate improved health management independence as evidenced bymaking healthy food choice, watching hidden salts and sugars, continuing to monitor blood sugars  Over  the next 120 days, patient will work with CM team pharmacist to monitor medications. The patient continues to work with the pharmacist to meet needs  Over the next 90 days, patient will work with CM clinical social worker to assist with resources as needed- ongoing support and assistance from the social worker  Over the next 90 days, patient will work with care guides to get adequate transportation- completed   Interventions:   Evaluation of current treatment plan related to DM and HTN dietary restrictions and patient's adherence to plan as established by provider.  Advised patient to monitor his dietary intake.  The patient says his blood sugars vary depending on what he eats. He ate BBQ sandwiches last night, his blood sugar this am was 200.  Education and reiterated the importance of watching salts and sugars in meal planning. 07-26-2019: the patient states that his blood sugar this am was 130. Endorses taking blood sugars daily but did not have list available.  Encouraged the patient to write down readings to have for CCM calls and MD visits. 09-06-2019: The patient states his blood sugars fasting are a little  greater than 100. The patient could not provide a list of readings. Education on the benefit of recording blood sugars for CCM team and MD visits. The patient verbalized understanding.   Provided education to patient re: to following a Heart Healthy/ADA diet.  The patient is aware of blood sugars being elevated and blood pressure elevation with use of high sodium foods. 09/06/2019: Review of heart healthy/ADA diet. The patient verbalized his blood sugars change dependent on what he eats.   Collaborated with pcp, CCM team regarding management of ADA.  Ongoing support and education needed.   Discussed plans with patient for ongoing care management follow up and provided patient with direct contact information for care management team  Advised patient, providing education and rationale, to  monitor blood pressure daily and record, calling pcp for findings outside established parameters.   Advised patient, providing education and rationale, to check cbg bid and record, calling pcp for findings outside established parameters.    Care Guide referral for transportation/phone- completed  Patient Self Care Activities:   Patient verbalizes understanding of plan to monitor health and  well being by following prescribed diet for chronic health conditions  Attends all scheduled provider appointments  Calls provider office for new concerns or questions  Unable to independently manage chronic medical conditions  Does not adhere to provider recommendations re: heart healthy/ADA  Please see past updates related to this goal by clicking on the "Past Updates" button in the selected goal         Patient verbalizes understanding of instructions provided today.   Telephone follow up appointment with care management team member scheduled for: 11-08-2019 at 0900  Alto Denver RN, MSN, CCM Community Care Coordinator [place]   [facility] Mobile: [phone]

## 2019-09-06 NOTE — Telephone Encounter (Signed)
Midcycle testosterone level was 307.  We can either increase his dose and continue every 2 weeks or move to weekly injections depending on his preference.

## 2019-09-06 NOTE — Chronic Care Management (AMB) (Signed)
Chronic Care Management   Follow Up Note   09/06/2019 Name: Corey [name] MRN: [id number] DOB: 06/21/1955  Referred by: Lane, Rachel Elizabeth, PA-C Reason for referral : Chronic Care Management (RNCM Follow up Call: Chronic Disease Management and Care Coordination Needs)   Corey [name] is a 64 y.o. year old male who is a primary care patient of Lane, Rachel Elizabeth, PA-C. The CCM team was consulted for assistance with chronic disease management and care coordination needs.    Review of patient status, including review of consultants reports, relevant laboratory and other test results, and collaboration with appropriate care team members and the patient's provider was performed as part of comprehensive patient evaluation and provision of chronic care management services.    SDOH (Social Determinants of Health) assessments performed: Yes See Care Plan activities for detailed interventions related to SDOH)     Outpatient Encounter Medications as of 09/06/2019  Medication Sig Note  . Accu-Chek FastClix Lancets MISC USE TO CHECK BLOOD SUGAR   . ACCU-CHEK GUIDE test strip USE TO CHECK BLOOD SUGAR TID   . acetaminophen (TYLENOL) 500 MG tablet Take 1,000 mg by mouth every 6 (six) hours as needed for moderate pain or headache.   . albuterol (PROVENTIL) (2.5 MG/3ML) 0.083% nebulizer solution USE 1 VIAL IN NEBULIZER EVERY 6 HOURS - and as needed. 04/03/2019: Using BID-TID  . albuterol (VENTOLIN HFA) 108 (90 Base) MCG/ACT inhaler INHALE 2 PUFFS INTO THE LUNGS EVERY 6 HOURS AS NEEDED FOR WHEEZING OR SHORTNESS OF BREATH   . amitriptyline (ELAVIL) 50 MG tablet TAKE 1 TO 2 TABLETS(50 TO 100 MG) BY MOUTH AT BEDTIME   . aspirin EC 81 MG tablet Take 81 mg by mouth daily.   . azelastine (ASTELIN) 0.1 % nasal spray USE 2 SPRAYS IN EACH NOSTRIL TWICE DAILY   . B-D UF III MINI PEN NEEDLES 31G X 5 MM MISC USE TWICE DAILY   . Blood Glucose Monitoring Suppl (ACCU-CHEK GUIDE) w/Device KIT U UTD   .  buPROPion (WELLBUTRIN XL) 150 MG 24 hr tablet TAKE 1 TABLET(150 MG) BY MOUTH DAILY   . cetirizine (ZYRTEC) 10 MG tablet TAKE 1 TABLET BY MOUTH DAILY   . Cholecalciferol (VITAMIN D3) 5000 units TABS Take 5,000 Units by mouth daily.    . citalopram (CELEXA) 10 MG tablet TAKE 1 TABLET BY MOUTH  DAILY   . diclofenac sodium (VOLTAREN) 1 % GEL Apply 1 application topically 4 (four) times daily as needed (pain).    . famotidine (PEPCID) 40 MG tablet Take 40 mg by mouth daily.   . Fluticasone-Salmeterol (ADVAIR) 500-50 MCG/DOSE AEPB Inhale 1 puff into the lungs 2 (two) times daily.   . furosemide (LASIX) 20 MG tablet TAKE 1 TABLET(20 MG) BY MOUTH TWICE DAILY   . isosorbide mononitrate (IMDUR) 60 MG 24 hr tablet Take 60 mg by mouth daily.   . JARDIANCE 25 MG TABS tablet Take 25 mg by mouth daily.   . Lancets Misc. (ACCU-CHEK FASTCLIX LANCET) KIT 1 Units by Does not apply route 2 (two) times daily.   . LANTUS SOLOSTAR 100 UNIT/ML Solostar Pen INJECT 75 UNITS [place] D. (Patient taking differently: 90 Units. INJECT 75 UNITS [place] D.)   . losartan (COZAAR) 50 MG tablet Take 50 mg by mouth daily.   . magnesium gluconate (MAGONATE) 500 MG tablet Take 500 mg by mouth daily.   . meloxicam (MOBIC) 7.5 MG tablet TAKE 1 TABLET(7.5 MG) BY MOUTH DAILY   . metFORMIN (GLUCOPHAGE) 1000   MG tablet TAKE 1 TABLET BY MOUTH  TWICE DAILY   . metoprolol succinate (TOPROL-XL) 25 MG 24 hr tablet Take 25 mg by mouth daily.   . montelukast (SINGULAIR) 10 MG tablet TAKE 1 TABLET(10 MG) BY MOUTH DAILY   . NEEDLE, DISP, 18 G 18G X 1" MISC Use as directed   . nystatin cream (MYCOSTATIN) Apply 1 application topically 2 (two) times daily.   . pantoprazole (PROTONIX) 40 MG tablet Take 1 tablet (40 mg total) by mouth 2 (two) times daily before a meal.   . pregabalin (LYRICA) 150 MG capsule Take 1 capsule (150 mg total) by mouth 2 (two) times daily.   . primidone (MYSOLINE) 50 MG tablet TAKE 2 TABLETS(100 MG) BY MOUTH DAILY   . rosuvastatin  (CRESTOR) 20 MG tablet Take 20 mg by mouth daily.   . Semaglutide, 1 MG/DOSE, (OZEMPIC, 1 MG/DOSE,) 2 MG/1.5ML SOPN Inject 1 mg into the skin once a week.   . sucralfate (CARAFATE) 1 g tablet TAKE 1 TABLET(1 GRAM) BY MOUTH THREE TIMES DAILY AS NEEDED   . SYRINGE-NEEDLE, DISP, 3 ML (LUER LOCK SAFETY SYRINGES) 21G X 1-1/2" 3 ML MISC Use as directed for testosterone administration   . testosterone cypionate (DEPOTESTOSTERONE CYPIONATE) 200 MG/ML injection Inject 1 mL (200 mg total) into the muscle every 14 (fourteen) days.    No facility-administered encounter medications on file as of 09/06/2019.     Objective:  BP Readings from Last 3 Encounters:  08/30/19 (!) 131/53  07/30/19 106/64  07/17/19 132/78    Goals Addressed              This Visit's Progress   .  COMPLETED: RN-I have several health problems (pt-stated)        Current Barriers: Goal completed. Some barriers have been transferred to new goals. See updated plan of care.  . Film/video editor.  . Chronic Disease Management support and education needs related to asthma, HTN, Angina, DMII and Chronic back pain related to degenerative disc and bilateral sciatica and the need for a scooter to maintain mobility and independence   Nurse Case Manager Clinical Goal(s):  Marland Kitchen Over the next 120 days, patient will work with [facility]  to address needs related to Managing multiple chronic disease processes   Interventions:  . Collaborated with PCP clinical staff  regarding patient's need for office visit to be evaluated specifically for POV/Scooter - the patient states that he has the scooter now and it is working well for him.  No new concerns at this time.  . All paperwork faxed including office notes to Otila Back at [facility] division. Fax # [phone] (done 01/2019) . Evaluate the patients needs for new request of wrist braces and ankle braces to help with the "arthritis" pain he is having- the patient states he has not  discussed this with the pcp or pain clinic.  The patient has these braces.  Now is requesting a shoulder brace. Instructed the patient to talk to the pcp about this at upcoming appointment on March 24-2021 at 10:15.  Denies any needs at this time related to braces. States he is doing well.  . Encouraged the patient to call his insurance company to find out about cost and also to utilize the OTC benefit catalog for standard braces. Reminder given.  . Advised the patient that he should speak to the pcp or pain clinic MD for recommendations on what to use to help with the arthritis pain in his ankles,hands,  and  shoulders . Educated on calling the provider for changes in level, intensity, or unresolved pain  Patient Self Care Activities:  . Patient verbalizes understanding of plan to obtain braces for hands/wrist and ankles . Unable to perform IADLs independently  Please see past updates related to this goal by clicking on the "Past Updates" button in the selected goal      .  RNCM-Pt-"I am taking my blood pressure at home" (pt-stated)        CARE PLAN ENTRY (see longtitudinal plan of care for additional care plan information)  Current Barriers:  . Chronic Disease Management support, education, and care coordination needs related to HTN and HLD  Clinical Goal(s) related to HTN and HLD:  Over the next 120 days, patient will:  . Work with the care management team to address educational, disease management, and care coordination needs  . Begin or continue self health monitoring activities as directed today Measure and record blood pressure 3/4 times per week and adhere to a heart healthy/ADA diet . Call provider office for new or worsened signs and symptoms Blood pressure findings outside established parameters, Oxygen saturation lower than established parameter, Chest pain, Shortness of breath, and New or worsened symptom related to HLD and chronic conditions . Call care management team with  questions or concerns . Verbalize basic understanding of patient centered plan of care established today  Interventions related to HTN and HLD:  . Evaluation of current treatment plans and patient's adherence to plan as established by provider.  States compliance with medications and plan of care. . Assessed patient understanding of disease states.  Review of blood pressure readings. The patient did not have any readings for the RNCM but states it is "good". Education on taking blood pressures regularly and recording, having list available at outreaches.  . Assessed patient's education and care coordination needs.  The patient needs consistent reminders to follow plan of care for health and wellness. The patient had a colonoscopy in July but he was not cleaned out good so they were not able to have a good test. The patient does not have follow up with the provider. Education on reaching out to specialist to see what the plan of care is concerning his GI health.  . Provided disease specific education to patient.  Education and support on Heart heatlhy/ADA diet, taking bp and glucose readings consistently and writing them down. The patient denies any issues at this time. Went for lab work on 09/05/2019. Will continue to monitor.   . Collaborated with appropriate clinical care team members regarding patient needs.  Ongoing support from CCM team pharmacist and LCSW.   Patient Self Care Activities related to HTN and HLD:  . Patient is unable to independently self-manage chronic health conditions  Please see past updates related to this goal by clicking on the "Past Updates" button in the selected goal      .  RNCM:"My sugar was up this am because I ate grapes" (pt-stated)        Current Barriers:  . Knowledge Deficits related to negative impact on health and well being in a patient with chronic disease processes and not following a prescribed Heart healthy/ADA diet . Financial Constraints.   . Transportation barriers  Nurse Case Manager Clinical Goal(s):  . Over the next 120 days, patient will verbalize understanding of plan for following a Heart healthy/ADA diet . Over the next 120 days, patient will work with RNCM, pcp and CCM team to   address needs related to dietary restrictions and following a heart healthy/ADA diet . Over the next 120 days, patient will demonstrate a decrease in hyperglycemic exacerbations as evidenced by decreased blood sugar readings and decrease hemoglobin A1C levels . Over the next 120 days, patient will demonstrate improved health management independence as evidenced bymaking healthy food choice, watching hidden salts and sugars, continuing to monitor blood sugars . Over the next 120 days, patient will work with CM team pharmacist to monitor medications. The patient continues to work with the pharmacist to meet needs . Over the next 90 days, patient will work with CM clinical social worker to assist with resources as needed- ongoing support and assistance from the social worker . Over the next 90 days, patient will work with care guides to get adequate transportation- completed   Interventions:  . Evaluation of current treatment plan related to DM and HTN dietary restrictions and patient's adherence to plan as established by provider. . Advised patient to monitor his dietary intake.  The patient says his blood sugars vary depending on what he eats. He ate BBQ sandwiches last night, his blood sugar this am was 200.  Education and reiterated the importance of watching salts and sugars in meal planning. 07-26-2019: the patient states that his blood sugar this am was 130. Endorses taking blood sugars daily but did not have list available.  Encouraged the patient to write down readings to have for CCM calls and MD visits. 09-06-2019: The patient states his blood sugars fasting are a little greater than 100. The patient could not provide a list of readings. Education on  the benefit of recording blood sugars for CCM team and MD visits. The patient verbalized understanding.  . Provided education to patient re: to following a Heart Healthy/ADA diet.  The patient is aware of blood sugars being elevated and blood pressure elevation with use of high sodium foods. 09/06/2019: Review of heart healthy/ADA diet. The patient verbalized his blood sugars change dependent on what he eats.  . Collaborated with pcp, CCM team regarding management of ADA.  Ongoing support and education needed.  . Discussed plans with patient for ongoing care management follow up and provided patient with direct contact information for care management team . Advised patient, providing education and rationale, to monitor blood pressure daily and record, calling pcp for findings outside established parameters.  . Advised patient, providing education and rationale, to check cbg bid and record, calling pcp for findings outside established parameters.   . Care Guide referral for transportation/phone- completed  Patient Self Care Activities:  . Patient verbalizes understanding of plan to monitor health and  well being by following prescribed diet for chronic health conditions . Attends all scheduled provider appointments . Calls provider office for new concerns or questions . Unable to independently manage chronic medical conditions . Does not adhere to provider recommendations re: heart healthy/ADA  Please see past updates related to this goal by clicking on the "Past Updates" button in the selected goal          Plan:   Telephone follow up appointment with care management team member scheduled for: 11-08-2019 at 0900   Pam Tate RN, MSN, CCM Community Care Coordinator [place]  [facility] Mobile: [phone]   

## 2019-09-06 NOTE — Telephone Encounter (Signed)
Spoke to patient and he wants to increase the dose every 2 weeks.

## 2019-09-07 ENCOUNTER — Other Ambulatory Visit: Payer: Self-pay | Admitting: Family Medicine

## 2019-09-08 MED ORDER — TESTOSTERONE CYPIONATE 200 MG/ML IM SOLN: 300.0000 mg | INTRAMUSCULAR | 0 refills | Status: DC

## 2019-09-08 NOTE — Telephone Encounter (Signed)
Increase dose to 300 mg (1.5 cc) every 2 weeks.    Lab visit for repeat midcycle testosterone level 1 month

## 2019-09-09 NOTE — Telephone Encounter (Signed)
Patient notified and voiced understanding. Appointment for lab is scheduled.

## 2019-09-10 NOTE — Telephone Encounter (Signed)
Patient last injection was 08/29/2019 he lab was 09/05/2019 next injection 09/12/2019

## 2019-09-17 ENCOUNTER — Encounter: Payer: Self-pay | Admitting: Student in an Organized Health Care Education/Training Program

## 2019-09-17 ENCOUNTER — Ambulatory Visit
Payer: 59 | Attending: Student in an Organized Health Care Education/Training Program | Admitting: Student in an Organized Health Care Education/Training Program

## 2019-09-17 ENCOUNTER — Other Ambulatory Visit: Payer: Self-pay

## 2019-09-17 VITALS — BP 122/66 | HR 86 | Resp 16 | Ht 68.0 in | Wt 216.0 lb

## 2019-09-17 DIAGNOSIS — G894 Chronic pain syndrome: Secondary | ICD-10-CM | POA: Insufficient documentation

## 2019-09-17 DIAGNOSIS — M533 Sacrococcygeal disorders, not elsewhere classified: Secondary | ICD-10-CM | POA: Insufficient documentation

## 2019-09-17 DIAGNOSIS — E1141 Type 2 diabetes mellitus with diabetic mononeuropathy: Secondary | ICD-10-CM | POA: Insufficient documentation

## 2019-09-17 DIAGNOSIS — M5442 Lumbago with sciatica, left side: Secondary | ICD-10-CM | POA: Diagnosis not present

## 2019-09-17 DIAGNOSIS — M47816 Spondylosis without myelopathy or radiculopathy, lumbar region: Secondary | ICD-10-CM | POA: Insufficient documentation

## 2019-09-17 DIAGNOSIS — G8929 Other chronic pain: Secondary | ICD-10-CM | POA: Diagnosis present

## 2019-09-17 DIAGNOSIS — M170 Bilateral primary osteoarthritis of knee: Secondary | ICD-10-CM | POA: Diagnosis present

## 2019-09-17 DIAGNOSIS — M5441 Lumbago with sciatica, right side: Secondary | ICD-10-CM | POA: Insufficient documentation

## 2019-09-17 DIAGNOSIS — M5136 Other intervertebral disc degeneration, lumbar region: Secondary | ICD-10-CM | POA: Insufficient documentation

## 2019-09-17 MED ORDER — DICLOFENAC SODIUM 1 % EX GEL: 4.0000 g | Freq: Four times a day (QID) | CUTANEOUS | 5 refills | Status: AC

## 2019-09-17 MED ORDER — PREGABALIN 150 MG PO CAPS: 150.0000 mg | ORAL_CAPSULE | Freq: Two times a day (BID) | ORAL | 5 refills | Status: DC

## 2019-09-17 NOTE — Progress Notes (Signed)
Safety precautions to be maintained throughout the outpatient stay will include: orient to surroundings, keep bed in low position, maintain call bell within reach at all times, provide assistance with transfer out of bed and ambulation.  

## 2019-09-17 NOTE — Progress Notes (Signed)
PROVIDER NOTE: Information contained herein reflects review and annotations entered in association with encounter. Interpretation of such information and data should be left to medically-trained personnel. Information provided to patient can be located elsewhere in the medical record under "Patient Instructions". Document created using STT-dictation technology, any transcriptional errors that may result from process are unintentional.    Patient: Corey [name]  Service Category: E/M  Provider: Gillis Santa, MD  DOB: Aug 06, 1955  DOS: 09/17/2019  Specialty: Interventional Pain Management  MRN: [id number]  Setting: Ambulatory outpatient  PCP: Volney American, PA-C  Type: Established Patient    Referring Provider: Volney American,*  Location: Office  Delivery: Face-to-face     HPI  Reason for encounter: Mr. NATANEL [name], a 64 y.o. year old male, is here today for evaluation and management of his Lumbar degenerative disc disease [M51.36]. Mr. Stecher primary complain today is Back Pain, Ankle Pain (bilateral), and Foot Pain (bilateral) Last encounter: Practice (09/05/2019). My last encounter with him was on 09/05/2019. Pertinent problems: Mr. Callander has Type 2 diabetes mellitus with hyperglycemia (HCC); RLS (restless legs syndrome); GERD (gastroesophageal reflux disease); Chronic bilateral low back pain with bilateral sciatica; Lumbar degenerative disc disease; Lumbar facet arthropathy; Sacroiliac joint pain; Chronic pain syndrome; and Bilateral primary osteoarthritis of knee on their pertinent problem list. Pain Assessment: Severity of Chronic pain is reported as a 5 /10. Location: Back Lower/denies. Onset: More than a month ago. Quality:  . Timing: Intermittent. Modifying factor(s): medicine. Vitals:  height is 5' 8"  (1.727 m) and weight is 216 lb (98 kg). His blood pressure is 122/66 and his pulse is 86. His respiration is 16 and oxygen saturation is 95%.   No change in medical history  since last visit.  Patient's pain is at baseline.  Patient continues multimodal pain regimen as prescribed.  States that it provides pain relief and improvement in functional status.   ROS  Constitutional: Denies any fever or chills Gastrointestinal: No reported hemesis, hematochezia, vomiting, or acute GI distress Musculoskeletal: Low back pain bilateral hip pain Neurological: No reported episodes of acute onset apraxia, aphasia, dysarthria, agnosia, amnesia, paralysis, loss of coordination, or loss of consciousness  Medication Review  Accu-Chek FastClix Lancet, Accu-Chek FastClix Lancets, Accu-Chek Guide, Fluticasone-Salmeterol, Insulin Pen Needle, NEEDLE (DISP) 18 G, SYRINGE-NEEDLE (DISP) 3 ML, Semaglutide (1 MG/DOSE), Vitamin D3, acetaminophen, albuterol, amitriptyline, aspirin EC, azelastine, buPROPion, cetirizine, citalopram, diclofenac Sodium, diclofenac sodium, empagliflozin, famotidine, furosemide, glucose blood, insulin glargine, isosorbide mononitrate, losartan, magnesium gluconate, meloxicam, metFORMIN, metoprolol succinate, montelukast, nystatin cream, pantoprazole, pregabalin, primidone, rosuvastatin, sucralfate, and testosterone cypionate  History Review  Allergy: Mr. Caiazzo has No Known Allergies. Drug: Mr. Woodberry  reports no history of drug use. Alcohol:  reports previous alcohol use. Tobacco:  reports that he has quit smoking. His smoking use included cigarettes. He smoked 0.50 packs per day. His smokeless tobacco use includes chew. Social: Mr. Schwan  reports that he has quit smoking. His smoking use included cigarettes. He smoked 0.50 packs per day. His smokeless tobacco use includes chew. He reports previous alcohol use. He reports that he does not use drugs. Medical:  has a past medical history of Asthma, Diabetes ([place]), GERD (gastroesophageal reflux disease), Hyperlipidemia, Hypertension, and Legg-Perthes disease. Surgical: Mr. Chichester  has a past surgical history that  includes Hip surgery; Foot Surgery; RIGHT/LEFT HEART CATH AND CORONARY ANGIOGRAPHY (N/A, 07/25/2018); Cardiac catheterization; Colonoscopy with propofol (N/A, 04/16/2019); Esophagogastroduodenoscopy (egd) with propofol (N/A, 04/16/2019); Colonoscopy with propofol (N/A, 06/14/2019); and Colonoscopy with propofol (  N/A, 07/30/2019). Family: family history includes Cancer in his mother; Diabetes in his father; Heart disease in his mother.  Laboratory Chemistry Profile   Renal Lab Results  Component Value Date   BUN 15 06/24/2019   CREATININE 1.07 06/24/2019   BCR 14 06/24/2019   GFRAA 86 06/24/2019   GFRNONAA 74 06/24/2019     Hepatic Lab Results  Component Value Date   AST 33 06/24/2019   ALT 37 06/24/2019   ALBUMIN 4.1 06/24/2019   ALKPHOS 135 (H) 06/24/2019     Electrolytes Lab Results  Component Value Date   NA 138 06/24/2019   K 4.2 06/24/2019   CL 99 06/24/2019   CALCIUM 8.5 (L) 06/24/2019     Bone Lab Results  Component Value Date   TESTOFREE 4.2 (L) 06/24/2019   TESTOSTERONE 307 09/05/2019     Inflammation (CRP: Acute Phase) (ESR: Chronic Phase) No results found for: CRP, ESRSEDRATE, LATICACIDVEN     Note: Above Lab results reviewed.  Recent Imaging Review  DG Sacroiliac Joint X-Rays CLINICAL DATA:  Chronic bilateral hip pain.  EXAM: BILATERAL SACROILIAC JOINTS - 3+ VIEW  COMPARISON:  None.  FINDINGS: The sacroiliac joint spaces are maintained and there is no evidence of arthropathy. Previously noted bilateral femoral head deformity suggesting old, healed slipped capital femoral epiphyses.  IMPRESSION: 1. Normal appearing sacroiliac joints. 2. 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  Electronically Signed   By: Claudie Revering M.D.   On: 12/07/2018 13:33 DG Lumbar Spine Complete w/ Flex/Ext (6 Views) CLINICAL DATA:  Chronic low back pain.  EXAM: LUMBAR SPINE - COMPLETE WITH BENDING VIEWS  COMPARISON:   None.  FINDINGS: Five non-rib-bearing lumbar vertebrae. Moderate to large-sized right lateral spurs at the L2-3 level. Mild moderate anterior spur formation at the T12-L1 and L2-3 levels. Minimal anterior spur formation at the L4-5 level. No fractures, pars defects or subluxations, including flexion-extension views. Large amount of stool in the right and transverse colon.  IMPRESSION: 1. Degenerative changes, as described above. 2. Prominent stool.  Electronically Signed   By: Claudie Revering M.D.   On: 12/07/2018 13:30 DG Knee (Left) CLINICAL DATA:  Chronic bilateral knee pain.  EXAM: LEFT KNEE - 1-2 VIEW  COMPARISON:  None.  FINDINGS: Minimal medial and posterior patellar spur formation. Otherwise, normal appearing bones and soft tissues. No effusion seen.  IMPRESSION: Minimal medial and patellofemoral degenerative changes.  Electronically Signed   By: Claudie Revering M.D.   On: 12/07/2018 13:28 DG Knee (Right) CLINICAL DATA:  Chronic bilateral hip pain.  EXAM: RIGHT KNEE - 1-2 VIEW  COMPARISON:  None.  FINDINGS: Minimal posterior patellar spur formation. Otherwise, normal appearing bones and soft tissues. No effusion.  IMPRESSION: Minimal patellofemoral degenerative changes.  Electronically Signed   By: Claudie Revering M.D.   On: 12/07/2018 13:27 DG Hip (Left) CLINICAL DATA:  Chronic low back and bilateral hip pain.  EXAM: DG HIP (WITH OR WITHOUT PELVIS) 2-3V LEFT  COMPARISON:  None.  FINDINGS: Deformity of both femoral heads with an appearance suggesting old, healed bilateral slipped capital femoral epiphyses. No fracture or dislocation seen. Diffuse osteopenia.  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 No acute abnormality.  Electronically Signed   By: Claudie Revering M.D.   On: 12/07/2018 13:26 DG Hip (Right) CLINICAL DATA:  Right hip pain.  Arthralgia.  EXAM: DG HIP (WITH OR WITHOUT PELVIS) 2-3V  RIGHT  COMPARISON:  No prior.  FINDINGS: Degenerative changes lumbar spine  and both hips. Deformity noted about both hips and proximal femurs, most likely developmental. Corticated bony density noted adjacent to the right hip possibly related to old injury. No acute bony abnormality identified. No evidence of fracture or dislocation. Pelvic calcifications consistent with phleboliths.  IMPRESSION: Degenerative changes lumbar spine and both hips. Deformity noted about both hips and proximal femurs, most likely developmental. No acute bony or joint abnormality identified.  Electronically Signed   By: Marcello Moores  Register   On: 12/07/2018 13:24 Note: Reviewed        Physical Exam  General appearance: Well nourished, well developed, and well hydrated. In no apparent acute distress Mental status: Alert, oriented x 3 (person, place, & time)       Respiratory: No evidence of acute respiratory distress Eyes: PERLA Vitals: BP 122/66   Pulse 86   Resp 16   Ht 5' 8"  (1.727 m)   Wt 216 lb (98 kg)   SpO2 95%   BMI 32.84 kg/m  BMI: Estimated body mass index is 32.84 kg/m as calculated from the following:   Height as of this encounter: 5' 8"  (1.727 m).   Weight as of this encounter: 216 lb (98 kg). Ideal: Ideal body weight: 68.4 kg (150 lb 12.7 oz) Adjusted ideal body weight: 80.2 kg (176 lb 14 oz)   Lumbar Spine Area Exam  Skin & Axial Inspection: No masses, redness, or swelling Alignment: Symmetrical Functional ROM: Pain restricted ROM affecting both sides Stability: No instability detected Muscle Tone/Strength: Functionally intact. No obvious neuro-muscular anomalies detected. Sensory (Neurological): Musculoskeletal pain pattern Palpation: No palpable anomalies       Provocative Tests: Hyperextension/rotation test: (+) bilaterally for facet joint pain. Lumbar quadrant test (Kemp's test): deferred today       Lateral bending test: deferred today       Patrick's Maneuver: (+) for  bilateral S-I arthralgia             FABER* test: deferred today                   S-I anterior distraction/compression test: deferred today         S-I lateral compression test: deferred today         S-I Thigh-thrust test: deferred today         S-I Gaenslen's test: deferred today         *(Flexion, ABduction and External Rotation)  Gait & Posture Assessment  Ambulation: Unassisted Gait: Relatively normal for age and body habitus Posture: WNL   Lower Extremity Exam    Side: Right lower extremity  Side: Left lower extremity  Stability: No instability observed          Stability: No instability observed          Skin & Extremity Inspection: Skin color, temperature, and hair growth are WNL. No peripheral edema or cyanosis. No masses, redness, swelling, asymmetry, or associated skin lesions. No contractures.  Skin & Extremity Inspection: Skin color, temperature, and hair growth are WNL. No peripheral edema or cyanosis. No masses, redness, swelling, asymmetry, or associated skin lesions. No contractures.  Functional ROM: Unrestricted ROM                  Functional ROM: Unrestricted ROM                  Muscle Tone/Strength: Functionally intact. No obvious neuro-muscular anomalies detected.  Muscle Tone/Strength: Functionally intact. No obvious neuro-muscular anomalies detected.  Sensory (Neurological): Arthropathic  arthralgia        Sensory (Neurological): Arthropathic arthralgia        DTR:  Patellar: 1+: trace Achilles: deferred today Plantar: deferred today  DTR: Patellar: 1+: trace Achilles: deferred today Plantar: deferred today  Palpation: No palpable anomalies  Palpation: No palpable anomalies     Assessment   Status Diagnosis  Controlled Controlled Controlled 1. Lumbar degenerative disc disease   2. Diabetic mononeuropathy associated with type 2 diabetes mellitus ([place])   3. Chronic bilateral low back pain with bilateral sciatica   4. Sacroiliac joint pain    5. Lumbar facet arthropathy   6. Bilateral primary osteoarthritis of knee   7. Chronic pain syndrome      Updated Problems: Problem  Chronic Bilateral Low Back Pain With Bilateral Sciatica  Lumbar Degenerative Disc Disease  Lumbar Facet Arthropathy  Sacroiliac Joint Pain  Chronic Pain Syndrome  Bilateral Primary Osteoarthritis of Knee  Gerd (Gastroesophageal Reflux Disease)  Type 2 Diabetes Mellitus With Hyperglycemia (Hcc)  Rls (Restless Legs Syndrome)    Plan of Care   Mr. JULYAN GALES has a current medication list which includes the following long-term medication(s): albuterol, fluticasone-salmeterol, lantus solostar, losartan, metoprolol succinate, albuterol, amitriptyline, azelastine, bupropion, cetirizine, citalopram, furosemide, isosorbide mononitrate, metformin, montelukast, pantoprazole, pregabalin, primidone, sucralfate, and testosterone cypionate.  Pharmacotherapy (Medications Ordered): Meds ordered this encounter  Medications  . pregabalin (LYRICA) 150 MG capsule    Sig: Take 1 capsule (150 mg total) by mouth 2 (two) times daily.    Dispense:  60 capsule    Refill:  5    Fill one day early if pharmacy is closed on scheduled refill date. May substitute for generic if available.  . diclofenac Sodium (VOLTAREN) 1 % GEL    Sig: Apply 4 g topically 4 (four) times daily.    Dispense:  350 g    Refill:  5    Do not add this medication to the electronic "Automatic Refill" notification system. Patient may have prescription filled one day early if pharmacy is closed on scheduled refill date.   Follow-up plan:   Return in about 6 months (around 03/19/2020) for Medication Management, in person.   Recent Visits No visits were found meeting these conditions. Showing recent visits within past 90 days and meeting all other requirements Today's Visits Date Type Provider Dept  09/17/19 Office Visit Gillis Santa, MD [facility]  Showing today's visits and meeting  all other requirements Future Appointments No visits were found meeting these conditions. Showing future appointments within next 90 days and meeting all other requirements  I discussed the assessment and treatment plan with the patient. The patient was provided an opportunity to ask questions and all were answered. The patient agreed with the plan and demonstrated an understanding of the instructions.  Patient advised to call back or seek an in-person evaluation if the symptoms or condition worsens.  Duration of encounter: 70mnutes.  Note by: BGillis Santa MD Date: 09/17/2019; Time: 3:31 PM

## 2019-09-20 ENCOUNTER — Other Ambulatory Visit: Payer: Self-pay | Admitting: Family Medicine

## 2019-09-27 ENCOUNTER — Other Ambulatory Visit: Payer: Self-pay | Admitting: Family Medicine

## 2019-10-17 ENCOUNTER — Other Ambulatory Visit: Payer: Self-pay | Admitting: Family Medicine

## 2019-10-17 DIAGNOSIS — E291 Testicular hypofunction: Secondary | ICD-10-CM

## 2019-10-18 ENCOUNTER — Other Ambulatory Visit: Payer: 59

## 2019-10-18 ENCOUNTER — Ambulatory Visit: Payer: Self-pay | Admitting: Pharmacist

## 2019-10-18 ENCOUNTER — Other Ambulatory Visit: Payer: Self-pay

## 2019-10-18 ENCOUNTER — Telehealth: Payer: Self-pay

## 2019-10-18 DIAGNOSIS — I10 Essential (primary) hypertension: Secondary | ICD-10-CM

## 2019-10-18 DIAGNOSIS — E1165 Type 2 diabetes mellitus with hyperglycemia: Secondary | ICD-10-CM

## 2019-10-18 DIAGNOSIS — E291 Testicular hypofunction: Secondary | ICD-10-CM

## 2019-10-18 DIAGNOSIS — Z794 Long term (current) use of insulin: Secondary | ICD-10-CM

## 2019-10-19 ENCOUNTER — Telehealth: Payer: Self-pay | Admitting: Urology

## 2019-10-19 LAB — TESTOSTERONE: Testosterone: 593 ng/dL (ref 264–916)

## 2019-10-19 NOTE — Telephone Encounter (Signed)
Testosterone level better at 593.  When was his last injection in relation to this blood draw and have symptoms improved?

## 2019-10-21 NOTE — Telephone Encounter (Signed)
Notified patient as instructed, patient pleased. Discussed follow-up appointments, patient agrees Patient last injection was on 10/11/2019

## 2019-10-24 ENCOUNTER — Other Ambulatory Visit: Payer: Self-pay | Admitting: Family Medicine

## 2019-10-24 NOTE — Telephone Encounter (Signed)
   Notes to clinic Historical provider   

## 2019-10-24 NOTE — Telephone Encounter (Signed)
Routing to provider  

## 2019-10-28 ENCOUNTER — Telehealth: Payer: Self-pay | Admitting: Licensed Clinical Social Worker

## 2019-10-28 ENCOUNTER — Telehealth: Payer: Self-pay

## 2019-10-28 NOTE — Telephone Encounter (Signed)
Chronic Care Management    Clinical Social Work General Follow Up Note  10/28/2019 Name: Corey [name] MRN: [id number] DOB: 1955/09/01  Corey [name] is a 64 y.o. year old male who is a primary care patient of Volney American, [place]. The CCM team was consulted for assistance with [employer] .   Review of patient status, including review of consultants reports, relevant laboratory and other test results, and collaboration with appropriate care team members and the patient's provider was performed as part of comprehensive patient evaluation and provision of chronic care management services.    LCSW completed CCM outreach attempt today but was unable to reach patient successfully. A HIPPA compliant voice message was UNABLE to be left as voice message had not been set up yet. LCSW will ask Scheduling Care Guide to reschedule CCM SW appointment with patient as well.  Outpatient Encounter Medications as of 10/28/2019  Medication Sig Note  . Accu-Chek FastClix Lancets MISC USE TO CHECK BLOOD SUGAR   . ACCU-CHEK GUIDE test strip USE TO CHECK BLOOD SUGAR TID   . acetaminophen (TYLENOL) 500 MG tablet Take 1,000 mg by mouth every 6 (six) hours as needed for moderate pain or headache.   . albuterol (PROVENTIL) (2.5 MG/3ML) 0.083% nebulizer solution USE 1 VIAL IN NEBULIZER EVERY 6 HOURS - and as needed. 04/03/2019: Using BID-TID  . albuterol (VENTOLIN HFA) 108 (90 Base) MCG/ACT inhaler INHALE 2 PUFFS INTO THE LUNGS EVERY 6 HOURS AS NEEDED FOR WHEEZING OR SHORTNESS OF BREATH   . amitriptyline (ELAVIL) 50 MG tablet TAKE 1 TO 2 TABLETS(50 TO 100 MG) BY MOUTH AT BEDTIME   . aspirin EC 81 MG tablet Take 81 mg by mouth daily.   Marland Kitchen azelastine (ASTELIN) 0.1 % nasal spray USE 2 SPRAYS IN EACH NOSTRIL TWICE DAILY   . B-D UF III MINI PEN NEEDLES 31G X 5 MM MISC USE TWICE DAILY   . Blood Glucose Monitoring Suppl (ACCU-CHEK GUIDE) w/Device KIT U UTD   . buPROPion (WELLBUTRIN XL) 150 MG 24 hr tablet  TAKE 1 TABLET(150 MG) BY MOUTH DAILY   . cetirizine (ZYRTEC) 10 MG tablet TAKE 1 TABLET BY MOUTH DAILY   . Cholecalciferol (VITAMIN D3) 5000 units TABS Take 5,000 Units by mouth daily.    . citalopram (CELEXA) 10 MG tablet TAKE 1 TABLET BY MOUTH  DAILY   . diclofenac sodium (VOLTAREN) 1 % GEL Apply 1 application topically 4 (four) times daily as needed (pain).    Marland Kitchen diclofenac Sodium (VOLTAREN) 1 % GEL Apply 4 g topically 4 (four) times daily.   . famotidine (PEPCID) 40 MG tablet Take 40 mg by mouth daily.   . Fluticasone-Salmeterol (ADVAIR) 500-50 MCG/DOSE AEPB Inhale 1 puff into the lungs 2 (two) times daily.   . furosemide (LASIX) 20 MG tablet TAKE 1 TABLET(20 MG) BY MOUTH TWICE DAILY   . isosorbide mononitrate (IMDUR) 60 MG 24 hr tablet Take 60 mg by mouth daily.   Marland Kitchen JARDIANCE 25 MG TABS tablet Take 25 mg by mouth daily.   . Lancets Misc. (ACCU-CHEK FASTCLIX LANCET) KIT 1 Units by Does not apply route 2 (two) times daily.   Marland Kitchen LANTUS SOLOSTAR 100 UNIT/ML Solostar Pen INJECT 75 UNITS [place] D. (Patient taking differently: 90 Units. INJECT 75 UNITS [place] D.)   . losartan (COZAAR) 50 MG tablet Take 50 mg by mouth daily.   . magnesium gluconate (MAGONATE) 500 MG tablet Take 500 mg by mouth daily.   . meloxicam (MOBIC)  7.5 MG tablet TAKE 1 TABLET(7.5 MG) BY MOUTH DAILY   . metFORMIN (GLUCOPHAGE) 1000 MG tablet TAKE 1 TABLET BY MOUTH  TWICE DAILY   . metoprolol succinate (TOPROL-XL) 25 MG 24 hr tablet Take 25 mg by mouth daily.   . montelukast (SINGULAIR) 10 MG tablet TAKE 1 TABLET(10 MG) BY MOUTH DAILY   . NEEDLE, DISP, 18 G 18G X 1" MISC Use as directed   . nystatin cream (MYCOSTATIN) Apply 1 application topically 2 (two) times daily.   . pantoprazole (PROTONIX) 40 MG tablet Take 1 tablet (40 mg total) by mouth 2 (two) times daily before a meal.   . pregabalin (LYRICA) 150 MG capsule Take 1 capsule (150 mg total) by mouth 2 (two) times daily.   . primidone (MYSOLINE) 50 MG tablet TAKE 2 TABLETS(100 MG)  BY MOUTH DAILY   . rosuvastatin (CRESTOR) 20 MG tablet TAKE 1 TABLET BY MOUTH  DAILY   . Semaglutide, 1 MG/DOSE, (OZEMPIC, 1 MG/DOSE,) 2 MG/1.5ML SOPN Inject 1 mg into the skin once a week.   . sucralfate (CARAFATE) 1 g tablet TAKE 1 TABLET(1 GRAM) BY MOUTH THREE TIMES DAILY AS NEEDED   . SYRINGE-NEEDLE, DISP, 3 ML (LUER LOCK SAFETY SYRINGES) 21G X 1-1/2" 3 ML MISC Use as directed for testosterone administration   . testosterone cypionate (DEPOTESTOSTERONE CYPIONATE) 200 MG/ML injection Inject 1.5 mLs (300 mg total) into the muscle every 14 (fourteen) days.    No facility-administered encounter medications on file as of 10/28/2019.    Follow Up Plan: [facility] will reach out to patient to reschedule appointment.   Eula Fried, BSW, MSW, [facility] [email or web address] Phone: [phone]

## 2019-10-31 NOTE — Telephone Encounter (Signed)
Pt has been r/s  

## 2019-11-08 ENCOUNTER — Telehealth: Payer: Self-pay | Admitting: General Practice

## 2019-11-08 ENCOUNTER — Other Ambulatory Visit: Payer: Self-pay | Admitting: Urology

## 2019-11-08 ENCOUNTER — Ambulatory Visit (INDEPENDENT_AMBULATORY_CARE_PROVIDER_SITE_OTHER): Payer: 59 | Admitting: General Practice

## 2019-11-08 DIAGNOSIS — E1165 Type 2 diabetes mellitus with hyperglycemia: Secondary | ICD-10-CM

## 2019-11-08 DIAGNOSIS — Z794 Long term (current) use of insulin: Secondary | ICD-10-CM

## 2019-11-08 DIAGNOSIS — E782 Mixed hyperlipidemia: Secondary | ICD-10-CM

## 2019-11-08 DIAGNOSIS — I10 Essential (primary) hypertension: Secondary | ICD-10-CM | POA: Diagnosis not present

## 2019-11-08 NOTE — Patient Instructions (Signed)
Visit Information  Goals Addressed              This Visit's Progress   .  RNCM-Pt-"I am taking my blood pressure at home" (pt-stated)        CARE PLAN ENTRY (see longtitudinal plan of care for additional care plan information)  Current Barriers:  . Chronic Disease Management support, education, and care coordination needs related to HTN and HLD  Clinical Goal(s) related to HTN and HLD:  Over the next 120 days, patient will:  . Work with the care management team to address educational, disease management, and care coordination needs  . Begin or continue self health monitoring activities as directed today Measure and record blood pressure 3/4 times per week and adhere to a heart healthy/ADA diet . Call provider office for new or worsened signs and symptoms Blood pressure findings outside established parameters, Oxygen saturation lower than established parameter, Chest pain, Shortness of breath, and New or worsened symptom related to HLD and chronic conditions . Call care management team with questions or concerns . Verbalize basic understanding of patient centered plan of care established today  Interventions related to HTN and HLD:  . Evaluation of current treatment plans and patient's adherence to plan as established by provider.  States compliance with medications and plan of care. . Assessed patient understanding of disease states.  Review of blood pressure readings. The patient did not have any readings for the [facility] but states it is "good". Education on taking blood pressures regularly and recording, having list available at [employer]. 11-08-2019: The patient states he is not taking his blood pressures. He forgets. Ask the patient to start taking blood pressure at least 2 times a week and writing the numbers down for the [facility].  Also ask the patient wife Kendal Hymen to help with reminding the patient to take his blood pressure and record.  . Assessed patient's education and care coordination  needs.  The patient needs consistent reminders to follow plan of care for health and wellness. 11-08-2019: The patient states on outreaches he wants to be healthy and maintain his health and well being. The patient has to have frequent reinforcement to stay on track with lifestyle changes to help control his chronic conditions. Will continue to work with the patient to help the patient reach his health and wellness goals.  . Provided disease specific education to patient.  Education and support on Heart heatlhy/ADA diet, taking bp and glucose readings consistently and writing them down. The patient denies any issues at this time. 11-08-2019: Will send new educational material by my chart and EMMI system for the patient on management of chronic conditions. Also will send information by mail to the patient on healthy eating.  Steele Sizer with appropriate clinical care team members regarding patient needs.  Ongoing support from [facility] team pharmacist and LCSW.   Patient Self Care Activities related to HTN and HLD:  . Patient is unable to independently self-manage chronic health conditions  Please see past updates related to this goal by clicking on the "Past Updates" button in the selected goal      .  RNCM:"My sugar was up this am because I ate grapes" (pt-stated)        Current Barriers:  Marland Kitchen Knowledge Deficits related to negative impact on health and well being in a patient with chronic disease processes and not following a prescribed Heart healthy/ADA diet . Corporate treasurer.  . Transportation barriers  Nurse Case Manager Clinical Goal(s):  .  Over the next 120 days, patient will verbalize understanding of plan for following a Heart healthy/ADA diet . Over the next 120 days, patient will work with RNCM, pcp and CCM team to address needs related to dietary restrictions and following a heart healthy/ADA diet . Over the next 120 days, patient will demonstrate a decrease in hyperglycemic exacerbations  as evidenced by decreased blood sugar readings and decrease hemoglobin A1C levels . Over the next 120 days, patient will demonstrate improved health management independence as evidenced bymaking healthy food choice, watching hidden salts and sugars, continuing to monitor blood sugars . Over the next 120 days, patient will work with CM team pharmacist to monitor medications. The patient continues to work with the pharmacist to meet needs . Over the next 90 days, patient will work with CM clinical social worker to assist with resources as needed- ongoing support and assistance from the Child psychotherapist . Over the next 90 days, patient will work with care guides to get adequate transportation- completed   Interventions:  . Evaluation of current treatment plan related to DM and HTN dietary restrictions and patient's adherence to plan as established by provider. 11-08-2019: The patient saw endocrinologist on 09-16-2019 and his hemoglobin is increasing.  It went from 9.3% in February to 10.2% on E-16-2021.  The patient states he has been eating a lot of bread. Ask the patient for blood sugar readings and he is having numbers in 300's.  The patient is not consistently following recommendations by the provider.   . Advised patient to monitor his dietary intake.  The patient says his blood sugars vary depending on what he eats. He ate BBQ sandwiches last night, his blood sugar this am was 200.  Education and reiterated the importance of watching salts and sugars in meal planning. 11-08-2019: the patient states that his blood sugars have been "high". When evaluation of high the patient states "300's".  Endorses taking blood sugars daily but did not have list available.  Encouraged the patient to write down readings to have for CCM calls and MD visits. The patient states his weakness is bread. The patient realizes his diabetes is not in good control. Discussed at length dietary restrictions and the need to follow  recommendations by provider. The patient had been drinking a lot of drinks but is drinking diet drinks now.  . Provided education to patient re: to following a Heart Healthy/ADA diet.  The patient is aware of blood sugars being elevated and blood pressure elevation with use of high sodium foods. 11-08-2019: Review of heart healthy/ADA diet. The patient verbalized his blood sugars change dependent on what he eats. Th patient is still making poor dietary choices. Education on breads, pastas, and corn being full of carbohydrates. The patient states he knows he has to do better. Education on monitoring dietary intake better and making positive changes to help with better control of diabetes. Education on how at least 15 minutes of moderate exercise can help lower blood sugars. The patient does like to walk. Encouraged walking daily.  Education on the effects uncontrolled DM of body systems, eyes, kidneys, etc.  The patient verbalized understanding. Will send information by mail on healthy food choices and getting blood sugars to goal. Education provided that fasting blood sugar should be <130 and post prandial of <180.  The patient verbalized understanding.  Marland Kitchen Collaborated with pcp, CCM team regarding management of ADA.  Ongoing support and education needed.  . Discussed plans with patient for ongoing care  management follow up and provided patient with direct contact information for care management team . Advised patient, providing education and rationale, to monitor blood pressure daily and record, calling pcp for findings outside established parameters.  . Advised patient, providing education and rationale, to check cbg bid and record, calling pcp for findings outside established parameters.   . Review of upcoming appointments. The patient states he will see the endocrinologist again next month. Has an appointment on 01-01-2020 at 0900 am to see Dr. Laural Benes. RNCM will plan on seeing the patient face to face on  01-01-2020 to review DM management and provide education and support.    Patient Self Care Activities:  . Patient verbalizes understanding of plan to monitor health and  well being by following prescribed diet for chronic health conditions . Attends all scheduled provider appointments . Calls provider office for new concerns or questions . Unable to independently manage chronic medical conditions . Does not adhere to provider recommendations re: heart healthy/ADA  Please see past updates related to this goal by clicking on the "Past Updates" button in the selected goal         Patient verbalizes understanding of instructions provided today.   Face to Face appointment with care management team member scheduled for: 01-01-2020 at 0900 am when the patient is in the office to see Dr. Laural Benes for follow up.   Alto Denver RN, MSN, CCM Community Care Coordinator [place]  [facility] Mobile: [phone]    Blood Pressure Record Sheet To take your blood pressure, you will need a blood pressure machine. You can buy a blood pressure machine (blood pressure monitor) at your clinic, drug store, or online. When choosing one, consider:  An automatic monitor that has an arm cuff.  A cuff that wraps snugly around your upper arm. You should be able to fit only one finger between your arm and the cuff.  A device that stores blood pressure reading results.  Do not choose a monitor that measures your blood pressure from your wrist or finger. Follow your health care provider's instructions for how to take your blood pressure. To use this form:  Get one reading in the morning (a.m.) before you take any medicines.  Get one reading in the evening (p.m.) before supper.  Take at least 2 readings with each blood pressure check. This makes sure the results are correct. Wait 1-2 minutes between measurements.  Write down the results in the spaces on this  form.  Repeat this once a week, or as told by your health care provider.  Make a follow-up appointment with your health care provider to discuss the results. Blood pressure log Date: _______________________  a.m. _____________________(1st reading) _____________________(2nd reading)  p.m. _____________________(1st reading) _____________________(2nd reading) Date: _______________________  a.m. _____________________(1st reading) _____________________(2nd reading)  p.m. _____________________(1st reading) _____________________(2nd reading) Date: _______________________  a.m. _____________________(1st reading) _____________________(2nd reading)  p.m. _____________________(1st reading) _____________________(2nd reading) Date: _______________________  a.m. _____________________(1st reading) _____________________(2nd reading)  p.m. _____________________(1st reading) _____________________(2nd reading) Date: _______________________  a.m. _____________________(1st reading) _____________________(2nd reading)  p.m. _____________________(1st reading) _____________________(2nd reading) This information is not intended to replace advice given to you by your health care provider. Make sure you discuss any questions you have with your health care provider. Document Revised: 03/17/2017 Document Reviewed: 01/17/2017 Elsevier Patient Education  2020 Elsevier Inc.  Diabetes Mellitus and Nutrition, Adult When you have diabetes (diabetes mellitus), it is very important to have healthy eating habits because your blood sugar (glucose)  levels are greatly affected by what you eat and drink. Eating healthy foods in the appropriate amounts, at about the same times every day, can help you:  Control your blood glucose.  Lower your risk of heart disease.  Improve your blood pressure.  Reach or maintain a healthy weight. Every person with diabetes is different, and each person has different needs for a  meal plan. Your health care provider may recommend that you work with a diet and nutrition specialist (dietitian) to make a meal plan that is best for you. Your meal plan may vary depending on factors such as:  The calories you need.  The medicines you take.  Your weight.  Your blood glucose, blood pressure, and cholesterol levels.  Your activity level.  Other health conditions you have, such as heart or kidney disease. How do carbohydrates affect me? Carbohydrates, also called carbs, affect your blood glucose level more than any other type of food. Eating carbs naturally raises the amount of glucose in your blood. Carb counting is a method for keeping track of how many carbs you eat. Counting carbs is important to keep your blood glucose at a healthy level, especially if you use insulin or take certain oral diabetes medicines. It is important to know how many carbs you can safely have in each meal. This is different for every person. Your dietitian can help you calculate how many carbs you should have at each meal and for each snack. Foods that contain carbs include:  Bread, cereal, rice, pasta, and crackers.  Potatoes and corn.  Peas, beans, and lentils.  Milk and yogurt.  Fruit and juice.  Desserts, such as cakes, cookies, ice cream, and candy. How does alcohol affect me? Alcohol can cause a sudden decrease in blood glucose (hypoglycemia), especially if you use insulin or take certain oral diabetes medicines. Hypoglycemia can be a life-threatening condition. Symptoms of hypoglycemia (sleepiness, dizziness, and confusion) are similar to symptoms of having too much alcohol. If your health care provider says that alcohol is safe for you, follow these guidelines:  Limit alcohol intake to no more than 1 drink per day for nonpregnant women and 2 drinks per day for men. One drink equals 12 oz of beer, 5 oz of wine, or 1 oz of hard liquor.  Do not drink on an empty stomach.  Keep  yourself hydrated with water, diet soda, or unsweetened iced tea.  Keep in mind that regular soda, juice, and other mixers may contain a lot of sugar and must be counted as carbs. What are tips for following this plan?  Reading food labels  Start by checking the serving size on the "Nutrition Facts" label of packaged foods and drinks. The amount of calories, carbs, fats, and other nutrients listed on the label is based on one serving of the item. Many items contain more than one serving per package.  Check the total grams (g) of carbs in one serving. You can calculate the number of servings of carbs in one serving by dividing the total carbs by 15. For example, if a food has 30 g of total carbs, it would be equal to 2 servings of carbs.  Check the number of grams (g) of saturated and trans fats in one serving. Choose foods that have low or no amount of these fats.  Check the number of milligrams (mg) of salt (sodium) in one serving. Most people should limit total sodium intake to less than 2,300 mg per day.  Always  check the nutrition information of foods labeled as "low-fat" or "nonfat". These foods may be higher in added sugar or refined carbs and should be avoided.  Talk to your dietitian to identify your daily goals for nutrients listed on the label. Shopping  Avoid buying canned, premade, or processed foods. These foods tend to be high in fat, sodium, and added sugar.  Shop around the outside edge of the grocery store. This includes fresh fruits and vegetables, bulk grains, fresh meats, and fresh dairy. Cooking  Use low-heat cooking methods, such as baking, instead of high-heat cooking methods like deep frying.  Cook using healthy oils, such as olive, canola, or sunflower oil.  Avoid cooking with butter, cream, or high-fat meats. Meal planning  Eat meals and snacks regularly, preferably at the same times every day. Avoid going long periods of time without eating.  Eat foods  high in fiber, such as fresh fruits, vegetables, beans, and whole grains. Talk to your dietitian about how many servings of carbs you can eat at each meal.  Eat 4-6 ounces (oz) of lean protein each day, such as lean meat, chicken, fish, eggs, or tofu. One oz of lean protein is equal to: ? 1 oz of meat, chicken, or fish. ? 1 egg. ?  cup of tofu.  Eat some foods each day that contain healthy fats, such as avocado, nuts, seeds, and fish. Lifestyle  Check your blood glucose regularly.  Exercise regularly as told by your health care provider. This may include: ? 150 minutes of moderate-intensity or vigorous-intensity exercise each week. This could be brisk walking, biking, or water aerobics. ? Stretching and doing strength exercises, such as yoga or weightlifting, at least 2 times a week.  Take medicines as told by your health care provider.  Do not use any products that contain nicotine or tobacco, such as cigarettes and e-cigarettes. If you need help quitting, ask your health care provider.  Work with a Veterinary surgeon or diabetes educator to identify strategies to manage stress and any emotional and social challenges. Questions to ask a health care provider  Do I need to meet with a diabetes educator?  Do I need to meet with a dietitian?  What number can I call if I have questions?  When are the best times to check my blood glucose? Where to find more information:  American Diabetes Association: diabetes.org  Academy of Nutrition and Dietetics: www.eatright.[employer] of Diabetes and Digestive and Kidney Diseases (NIH): CarFlippers.tn Summary  A healthy meal plan will help you control your blood glucose and maintain a healthy lifestyle.  Working with a diet and nutrition specialist (dietitian) can help you make a meal plan that is best for you.  Keep in mind that carbohydrates (carbs) and alcohol have immediate effects on your blood glucose levels. It is important  to count carbs and to use alcohol carefully. This information is not intended to replace advice given to you by your health care provider. Make sure you discuss any questions you have with your health care provider. Document Revised: 12/30/2016 Document Reviewed: 02/22/2016 Elsevier Patient Education  2020 Elsevier Inc.  DASH Eating Plan DASH stands for "Dietary Approaches to Stop Hypertension." The DASH eating plan is a healthy eating plan that has been shown to reduce high blood pressure (hypertension). It may also reduce your risk for type 2 diabetes, heart disease, and stroke. The DASH eating plan may also help with weight loss. What are tips for following this plan?  General guidelines  Avoid eating more than 2,300 mg (milligrams) of salt (sodium) a day. If you have hypertension, you may need to reduce your sodium intake to 1,500 mg a day.  Limit alcohol intake to no more than 1 drink a day for nonpregnant women and 2 drinks a day for men. One drink equals 12 oz of beer, 5 oz of wine, or 1 oz of hard liquor.  Work with your health care provider to maintain a healthy body weight or to lose weight. Ask what an ideal weight is for you.  Get at least 30 minutes of exercise that causes your heart to beat faster (aerobic exercise) most days of the week. Activities may include walking, swimming, or biking.  Work with your health care provider or diet and nutrition specialist (dietitian) to adjust your eating plan to your individual calorie needs. Reading food labels   Check food labels for the amount of sodium per serving. Choose foods with less than 5 percent of the Daily Value of sodium. Generally, foods with less than 300 mg of sodium per serving fit into this eating plan.  To find whole grains, look for the word "whole" as the first word in the ingredient list. Shopping  Buy products labeled as "low-sodium" or "no salt added."  Buy fresh foods. Avoid canned foods and premade or  frozen meals. Cooking  Avoid adding salt when cooking. Use salt-free seasonings or herbs instead of table salt or sea salt. Check with your health care provider or pharmacist before using salt substitutes.  Do not fry foods. Cook foods using healthy methods such as baking, boiling, grilling, and broiling instead.  Cook with heart-healthy oils, such as olive, canola, soybean, or sunflower oil. Meal planning  Eat a balanced diet that includes: ? 5 or more servings of fruits and vegetables each day. At each meal, try to fill half of your plate with fruits and vegetables. ? Up to 6-8 servings of whole grains each day. ? Less than 6 oz of lean meat, poultry, or fish each day. A 3-oz serving of meat is about the same size as a deck of cards. One egg equals 1 oz. ? 2 servings of low-fat dairy each day. ? A serving of nuts, seeds, or beans 5 times each week. ? Heart-healthy fats. Healthy fats called Omega-3 fatty acids are found in foods such as flaxseeds and coldwater fish, like sardines, salmon, and mackerel.  Limit how much you eat of the following: ? Canned or prepackaged foods. ? Food that is high in trans fat, such as fried foods. ? Food that is high in saturated fat, such as fatty meat. ? Sweets, desserts, sugary drinks, and other foods with added sugar. ? Full-fat dairy products.  Do not salt foods before eating.  Try to eat at least 2 vegetarian meals each week.  Eat more home-cooked food and less restaurant, buffet, and fast food.  When eating at a restaurant, ask that your food be prepared with less salt or no salt, if possible. What foods are recommended? The items listed may not be a complete list. Talk with your dietitian about what dietary choices are best for you. Grains Whole-grain or whole-wheat bread. Whole-grain or whole-wheat pasta. Brown rice. Orpah Cobb. Bulgur. Whole-grain and low-sodium cereals. Pita bread. Low-fat, low-sodium crackers. Whole-wheat flour  tortillas. Vegetables Fresh or frozen vegetables (raw, steamed, roasted, or grilled). Low-sodium or reduced-sodium tomato and vegetable juice. Low-sodium or reduced-sodium tomato sauce and tomato paste. Low-sodium or reduced-sodium  canned vegetables. Fruits All fresh, dried, or frozen fruit. Canned fruit in natural juice (without added sugar). Meat and other protein foods Skinless chicken or Mal[place]urkey. Ground chicken or [place]key. Pork with fat trimmed off. Fish and seafood. Egg whites. Dried beans, peas, or lentils. Unsalted nuts, nut butters, and seeds. Unsalted canned beans. Lean cuts of beef with fat trimmed off. Low-sodium, lean deli meat. Dairy Low-fat (1%) or fat-free (skim) milk. Fat-free, low-fat, or reduced-fat cheeses. Nonfat, low-sodium ricotta or cottage cheese. Low-fat or nonfat yogurt. Low-fat, low-sodium cheese. Fats and oils Soft margarine without trans fats. Vegetable oil. Low-fat, reduced-fat, or light mayonnaise and salad dressings (reduced-sodium). Canola, safflower, olive, soybean, and sunflower oils. Avocado. Seasoning and other foods Herbs. Spices. Seasoning mixes without salt. Unsalted popcorn and pretzels. Fat-free sweets. What foods are not recommended? The items listed may not be a complete list. Talk with your dietitian about what dietary choices are best for you. Grains Baked goods made with fat, such as croissants, muffins, or some breads. Dry pasta or rice meal packs. Vegetables Creamed or fried vegetables. Vegetables in a cheese sauce. Regular canned vegetables (not low-sodium or reduced-sodium). Regular canned tomato sauce and paste (not low-sodium or reduced-sodium). Regular tomato and vegetable juice (not low-sodium or reduced-sodium). Rosita FirePickles. Olives. Fruits Canned fruit in a light or heavy syrup. Fried fruit. Fruit in cream or butter sauce. Meat and other protein foods Fatty cuts of meat. Ribs. Fried meat. Tomasa BlaseBacon. Sausage. Bologna and other processed lunch meats.  Salami. Fatback. Hotdogs. Bratwurst. Salted nuts and seeds. Canned beans with added salt. Canned or smoked fish. Whole eggs or egg yolks. Chicken or [place]urkey with skin. Dairy Whole or 2% milk, cream, and half-and-half. Whole or full-fat cream cheese. Whole-fat or sweetened yogurt. Full-fat cheese. Nondairy creamers. Whipped toppings. Processed cheese and cheese spreads. Fats and oils Butter. Stick margarine. Lard. Shortening. Ghee. Bacon fat. Tropical oils, such as coconut, palm kernel, or palm oil. Seasoning and other foods Salted popcorn and pretzels. Onion salt, garlic salt, seasoned salt, table salt, and sea salt. Worcestershire sauce. Tartar sauce. Barbecue sauce. Teriyaki sauce. Soy sauce, including reduced-sodium. Steak sauce. Canned and packaged gravies. Fish sauce. Oyster sauce. Cocktail sauce. Horseradish that you find on the shelf. Ketchup. Mustard. Meat flavorings and tenderizers. Bouillon cubes. Hot sauce and Tabasco sauce. Premade or packaged marinades. Premade or packaged taco seasonings. Relishes. Regular salad dressings. Where to find more information:  National Heart, Lung, and Blood Institute: PopSteam.iswww.nhlbi.nih.gov  American Heart Association: www.heart.org Summary  The DASH eating plan is a healthy eating plan that has been shown to reduce high blood pressure (hypertension). It may also reduce your risk for type 2 diabetes, heart disease, and stroke.  With the DASH eating plan, you should limit salt (sodium) intake to 2,300 mg a day. If you have hypertension, you may need to reduce your sodium intake to 1,500 mg a day.  When on the DASH eating plan, aim to eat more fresh fruits and vegetables, whole grains, lean proteins, low-fat dairy, and heart-healthy fats.  Work with your health care provider or diet and nutrition specialist (dietitian) to adjust your eating plan to your individual calorie needs. This information is not intended to replace advice given to you by your health  care provider. Make sure you discuss any questions you have with your health care provider. Document Revised: 12/30/2016 Document Reviewed: 01/11/2016 Elsevier Patient Education  2020 [employer]

## 2019-11-08 NOTE — Telephone Encounter (Signed)
Rx sent.  Needs a lab visit for testosterone, hematocrit January 2022

## 2019-11-08 NOTE — Chronic Care Management (AMB) (Signed)
Chronic Care Management   Follow Up Note   11/08/2019 Name: Corey [name] MRN: [id number] DOB: 01-09-1956  Referred by: Volney American, PA-C Reason for referral : Chronic Care Management (RNCM: Chronic Disease Management and Care Coordination Needs)   Corey [name] is a 64 y.o. year old male who is a primary care patient of Volney American, [place]. The CCM team was consulted for assistance with chronic disease management and care coordination needs.    Review of patient status, including review of consultants reports, relevant laboratory and other test results, and collaboration with appropriate care team members and the patient's provider was performed as part of comprehensive patient evaluation and provision of chronic care management services.    SDOH (Social Determinants of Health) assessments performed: Yes See Care Plan activities for detailed interventions related to [facility])     Outpatient Encounter Medications as of 11/08/2019  Medication Sig Note  . Accu-Chek FastClix Lancets MISC USE TO CHECK BLOOD SUGAR   . ACCU-CHEK GUIDE test strip USE TO CHECK BLOOD SUGAR TID   . acetaminophen (TYLENOL) 500 MG tablet Take 1,000 mg by mouth every 6 (six) hours as needed for moderate pain or headache.   . albuterol (PROVENTIL) (2.5 MG/3ML) 0.083% nebulizer solution USE 1 VIAL IN NEBULIZER EVERY 6 HOURS - and as needed. 04/03/2019: Using BID-TID  . albuterol (VENTOLIN HFA) 108 (90 Base) MCG/ACT inhaler INHALE 2 PUFFS INTO THE LUNGS EVERY 6 HOURS AS NEEDED FOR WHEEZING OR SHORTNESS OF BREATH   . amitriptyline (ELAVIL) 50 MG tablet TAKE 1 TO 2 TABLETS(50 TO 100 MG) BY MOUTH AT BEDTIME   . aspirin EC 81 MG tablet Take 81 mg by mouth daily.   Marland Kitchen azelastine (ASTELIN) 0.1 % nasal spray USE 2 SPRAYS IN EACH NOSTRIL TWICE DAILY   . B-D UF III MINI PEN NEEDLES 31G X 5 MM MISC USE TWICE DAILY   . Blood Glucose Monitoring Suppl (ACCU-CHEK GUIDE) w/Device KIT U UTD   . buPROPion (WELLBUTRIN  XL) 150 MG 24 hr tablet TAKE 1 TABLET(150 MG) BY MOUTH DAILY   . cetirizine (ZYRTEC) 10 MG tablet TAKE 1 TABLET BY MOUTH DAILY   . Cholecalciferol (VITAMIN D3) 5000 units TABS Take 5,000 Units by mouth daily.    . citalopram (CELEXA) 10 MG tablet TAKE 1 TABLET BY MOUTH  DAILY   . diclofenac sodium (VOLTAREN) 1 % GEL Apply 1 application topically 4 (four) times daily as needed (pain).    Marland Kitchen diclofenac Sodium (VOLTAREN) 1 % GEL Apply 4 g topically 4 (four) times daily.   . famotidine (PEPCID) 40 MG tablet Take 40 mg by mouth daily.   . Fluticasone-Salmeterol (ADVAIR) 500-50 MCG/DOSE AEPB Inhale 1 puff into the lungs 2 (two) times daily.   . furosemide (LASIX) 20 MG tablet TAKE 1 TABLET(20 MG) BY MOUTH TWICE DAILY   . isosorbide mononitrate (IMDUR) 60 MG 24 hr tablet Take 60 mg by mouth daily.   Marland Kitchen JARDIANCE 25 MG TABS tablet Take 25 mg by mouth daily.   . Lancets Misc. (ACCU-CHEK FASTCLIX LANCET) KIT 1 Units by Does not apply route 2 (two) times daily.   Marland Kitchen LANTUS SOLOSTAR 100 UNIT/ML Solostar Pen INJECT 75 UNITS [place] D. (Patient taking differently: 90 Units. INJECT 75 UNITS [place] D.)   . losartan (COZAAR) 50 MG tablet Take 50 mg by mouth daily.   . magnesium gluconate (MAGONATE) 500 MG tablet Take 500 mg by mouth daily.   . meloxicam (MOBIC) 7.5 MG  tablet TAKE 1 TABLET(7.5 MG) BY MOUTH DAILY   . metFORMIN (GLUCOPHAGE) 1000 MG tablet TAKE 1 TABLET BY MOUTH  TWICE DAILY   . metoprolol succinate (TOPROL-XL) 25 MG 24 hr tablet Take 25 mg by mouth daily.   . montelukast (SINGULAIR) 10 MG tablet TAKE 1 TABLET(10 MG) BY MOUTH DAILY   . NEEDLE, DISP, 18 G 18G X 1" MISC Use as directed   . nystatin cream (MYCOSTATIN) Apply 1 application topically 2 (two) times daily.   . pantoprazole (PROTONIX) 40 MG tablet Take 1 tablet (40 mg total) by mouth 2 (two) times daily before a meal.   . pregabalin (LYRICA) 150 MG capsule Take 1 capsule (150 mg total) by mouth 2 (two) times daily.   . primidone (MYSOLINE) 50 MG  tablet TAKE 2 TABLETS(100 MG) BY MOUTH DAILY   . rosuvastatin (CRESTOR) 20 MG tablet TAKE 1 TABLET BY MOUTH  DAILY   . Semaglutide, 1 MG/DOSE, (OZEMPIC, 1 MG/DOSE,) 2 MG/1.5ML SOPN Inject 1 mg into the skin once a week.   . sucralfate (CARAFATE) 1 g tablet TAKE 1 TABLET(1 GRAM) BY MOUTH THREE TIMES DAILY AS NEEDED   . SYRINGE-NEEDLE, DISP, 3 ML (LUER LOCK SAFETY SYRINGES) 21G X 1-1/2" 3 ML MISC Use as directed for testosterone administration   . testosterone cypionate (DEPOTESTOSTERONE CYPIONATE) 200 MG/ML injection Inject 1.5 mLs (300 mg total) into the muscle every 14 (fourteen) days.    No facility-administered encounter medications on file as of 11/08/2019.     Objective:  BP Readings from Last 3 Encounters:  09/17/19 122/66  08/30/19 (!) 131/53  07/30/19 106/64   Lab Results  Component Value Date   HGBA1C 9.3 03/18/2019  Last hemoglobin A1C at endocrinologist was 10.2 on 09-16-2019  Goals Addressed              This Visit's Progress   .  RNCM-Pt-"I am taking my blood pressure at home" (pt-stated)        CARE PLAN ENTRY (see longtitudinal plan of care for additional care plan information)  Current Barriers:  . Chronic Disease Management support, education, and care coordination needs related to HTN and HLD  Clinical Goal(s) related to HTN and HLD:  Over the next 120 days, patient will:  . Work with the care management team to address educational, disease management, and care coordination needs  . Begin or continue self health monitoring activities as directed today Measure and record blood pressure 3/4 times per week and adhere to a heart healthy/ADA diet . Call provider office for new or worsened signs and symptoms Blood pressure findings outside established parameters, Oxygen saturation lower than established parameter, Chest pain, Shortness of breath, and New or worsened symptom related to HLD and chronic conditions . Call care management team with questions or  concerns . Verbalize basic understanding of patient centered plan of care established today  Interventions related to HTN and HLD:  . Evaluation of current treatment plans and patient's adherence to plan as established by provider.  States compliance with medications and plan of care. . Assessed patient understanding of disease states.  Review of blood pressure readings. The patient did not have any readings for the [facility] but states it is "good". Education on taking blood pressures regularly and recording, having list available at [employer]. 11-08-2019: The patient states he is not taking his blood pressures. He forgets. Ask the patient to start taking blood pressure at least 2 times a week and writing the numbers down for  the RNCM.  Also ask the patient wife Horris Latino to help with reminding the patient to take his blood pressure and record.  . Assessed patient's education and care coordination needs.  The patient needs consistent reminders to follow plan of care for health and wellness. 11-08-2019: The patient states on outreaches he wants to be healthy and maintain his health and well being. The patient has to have frequent reinforcement to stay on track with lifestyle changes to help control his chronic conditions. Will continue to work with the patient to help the patient reach his health and wellness goals.  . Provided disease specific education to patient.  Education and support on Heart heatlhy/ADA diet, taking bp and glucose readings consistently and writing them down. The patient denies any issues at this time. 11-08-2019: Will send new educational material by my chart and EMMI system for the patient on management of chronic conditions. Also will send information by mail to the patient on healthy eating.  Nash Dimmer with appropriate clinical care team members regarding patient needs.  Ongoing support from [facility] team pharmacist and LCSW.   Patient Self Care Activities related to HTN and HLD:   . Patient is unable to independently self-manage chronic health conditions  Please see past updates related to this goal by clicking on the "Past Updates" button in the selected goal      .  RNCM:"My sugar was up this am because I ate grapes" (pt-stated)        Current Barriers:  Marland Kitchen Knowledge Deficits related to negative impact on health and well being in a patient with chronic disease processes and not following a prescribed Heart healthy/ADA diet . Film/video editor.  . Transportation barriers  Nurse Case Manager Clinical Goal(s):  Marland Kitchen Over the next 120 days, patient will verbalize understanding of plan for following a Heart healthy/ADA diet . Over the next 120 days, patient will work with [place], pcp and CCM team to address needs related to dietary restrictions and following a heart healthy/ADA diet . Over the next 120 days, patient will demonstrate a decrease in hyperglycemic exacerbations as evidenced by decreased blood sugar readings and decrease hemoglobin A1C levels . Over the next 120 days, patient will demonstrate improved health management independence as evidenced bymaking healthy food choice, watching hidden salts and sugars, continuing to monitor blood sugars . Over the next 120 days, patient will work with CM team pharmacist to monitor medications. The patient continues to work with the pharmacist to meet needs . Over the next 90 days, patient will work with CM clinical social worker to assist with resources as needed- ongoing support and assistance from the Education officer, museum . Over the next 90 days, patient will work with care guides to get adequate transportation- completed   Interventions:  . Evaluation of current treatment plan related to DM and HTN dietary restrictions and patient's adherence to plan as established by provider. 11-08-2019: The patient saw endocrinologist on 09-16-2019 and his hemoglobin is increasing.  It went from 9.3% in February to 10.2% on E-16-2021.  The  patient states he has been eating a lot of bread. Ask the patient for blood sugar readings and he is having numbers in 300's.  The patient is not consistently following recommendations by the provider.   . Advised patient to monitor his dietary intake.  The patient says his blood sugars vary depending on what he eats. He ate BBQ sandwiches last night, his blood sugar this am was 200.  Education and reiterated  the importance of watching salts and sugars in meal planning. 11-08-2019: the patient states that his blood sugars have been "high". When evaluation of high the patient states "300's".  Endorses taking blood sugars daily but did not have list available.  Encouraged the patient to write down readings to have for CCM calls and MD visits. The patient states his weakness is bread. The patient realizes his diabetes is not in good control. Discussed at length dietary restrictions and the need to follow recommendations by provider. The patient had been drinking a lot of drinks but is drinking diet drinks now.  . Provided education to patient re: to following a Heart Healthy/ADA diet.  The patient is aware of blood sugars being elevated and blood pressure elevation with use of high sodium foods. 11-08-2019: Review of heart healthy/ADA diet. The patient verbalized his blood sugars change dependent on what he eats. Th patient is still making poor dietary choices. Education on breads, pastas, and corn being full of carbohydrates. The patient states he knows he has to do better. Education on monitoring dietary intake better and making positive changes to help with better control of diabetes. Education on how at least 15 minutes of moderate exercise can help lower blood sugars. The patient does like to walk. Encouraged walking daily.  Education on the effects uncontrolled DM of body systems, eyes, kidneys, etc.  The patient verbalized understanding. Will send information by mail on healthy food choices and getting blood  sugars to goal. Education provided that fasting blood sugar should be <130 and post prandial of <180.  The patient verbalized understanding.  Marland Kitchen Collaborated with pcp, CCM team regarding management of ADA.  Ongoing support and education needed.  . Discussed plans with patient for ongoing care management follow up and provided patient with direct contact information for care management team . Advised patient, providing education and rationale, to monitor blood pressure daily and record, calling pcp for findings outside established parameters.  . Advised patient, providing education and rationale, to check cbg bid and record, calling pcp for findings outside established parameters.   . Review of upcoming appointments. The patient states he will see the endocrinologist again next month. Has an appointment on 01-01-2020 at 0900 am to see Dr. Wynetta Emery. RNCM will plan on seeing the patient face to face on 01-01-2020 to review DM management and provide education and support.    Patient Self Care Activities:  . Patient verbalizes understanding of plan to monitor health and  well being by following prescribed diet for chronic health conditions . Attends all scheduled provider appointments . Calls provider office for new concerns or questions . Unable to independently manage chronic medical conditions . Does not adhere to provider recommendations re: heart healthy/ADA  Please see past updates related to this goal by clicking on the "Past Updates" button in the selected goal          Plan:   Face to Face appointment with care management team member scheduled for: 01-01-2020 at 0900 am when the patient is in the office to see Dr. Wynetta Emery for appointment.    Noreene Larsson RN, MSN, [facility] Mobile: [phone]

## 2019-11-10 NOTE — Chronic Care Management (AMB) (Signed)
Chronic Care Management   Follow Up Note   11/10/2019 Name: Corey [name] MRN: [id number] DOB: Oct 10, 1955  Referred by: Corey American, PA-C Reason for referral : No chief complaint on file.   Corey [name] is a 64 y.o. year old male who is a primary care patient of Corey [name], [place]. The CCM team was consulted for assistance with chronic disease management and care coordination needs.    Review of patient status, including review of consultants reports, relevant laboratory and other test results, and collaboration with appropriate care team members and the patient's provider was performed as part of comprehensive patient evaluation and provision of chronic care management services.    SDOH (Social Determinants of Health) assessments performed: No See Care Plan activities for detailed interventions related to [facility])     Outpatient Encounter Medications as of 10/18/2019  Medication Sig Note  . Accu-Chek FastClix Lancets MISC USE TO CHECK BLOOD SUGAR   . ACCU-CHEK GUIDE test strip USE TO CHECK BLOOD SUGAR TID   . albuterol (PROVENTIL) (2.5 MG/3ML) 0.083% nebulizer solution USE 1 VIAL IN NEBULIZER EVERY 6 HOURS - and as needed. 04/03/2019: Using BID-TID  . albuterol (VENTOLIN HFA) 108 (90 Base) MCG/ACT inhaler INHALE 2 PUFFS INTO THE LUNGS EVERY 6 HOURS AS NEEDED FOR WHEEZING OR SHORTNESS OF BREATH   . aspirin EC 81 MG tablet Take 81 mg by mouth daily.   . B-D UF III MINI PEN NEEDLES 31G X 5 MM MISC USE TWICE DAILY   . Blood Glucose Monitoring Suppl (ACCU-CHEK GUIDE) w/Device KIT U UTD   . buPROPion (WELLBUTRIN XL) 150 MG 24 hr tablet TAKE 1 TABLET(150 MG) BY MOUTH DAILY   . cetirizine (ZYRTEC) 10 MG tablet TAKE 1 TABLET BY MOUTH DAILY   . Cholecalciferol (VITAMIN D3) 5000 units TABS Take 5,000 Units by mouth daily.    . citalopram (CELEXA) 10 MG tablet TAKE 1 TABLET BY MOUTH  DAILY   . famotidine (PEPCID) 40 MG tablet Take 40 mg by mouth daily.   .  Fluticasone-Salmeterol (ADVAIR) 500-50 MCG/DOSE AEPB Inhale 1 puff into the lungs 2 (two) times daily.   . furosemide (LASIX) 20 MG tablet TAKE 1 TABLET(20 MG) BY MOUTH TWICE DAILY   . JARDIANCE 25 MG TABS tablet Take 25 mg by mouth daily.   . Lancets Misc. (ACCU-CHEK FASTCLIX LANCET) KIT 1 Units by Does not apply route 2 (two) times daily.   Marland Kitchen LANTUS SOLOSTAR 100 UNIT/ML Solostar Pen INJECT 75 UNITS [place] D. (Patient taking differently: 90 Units. INJECT 75 UNITS [place] D.)   . losartan (COZAAR) 50 MG tablet Take 50 mg by mouth daily.   . metFORMIN (GLUCOPHAGE) 1000 MG tablet TAKE 1 TABLET BY MOUTH  TWICE DAILY   . NEEDLE, DISP, 18 G 18G X 1" MISC Use as directed   . pantoprazole (PROTONIX) 40 MG tablet Take 1 tablet (40 mg total) by mouth 2 (two) times daily before a meal.   . Semaglutide, 1 MG/DOSE, (OZEMPIC, 1 MG/DOSE,) 2 MG/1.5ML SOPN Inject 1 mg into the skin once a week.   . SYRINGE-NEEDLE, DISP, 3 ML (LUER LOCK SAFETY SYRINGES) 21G X 1-1/2" 3 ML MISC Use as directed for testosterone administration   . [DISCONTINUED] testosterone cypionate (DEPOTESTOSTERONE CYPIONATE) 200 MG/ML injection Inject 1.5 mLs (300 mg total) into the muscle every 14 (fourteen) days.   Marland Kitchen acetaminophen (TYLENOL) 500 MG tablet Take 1,000 mg by mouth every 6 (six) hours as needed for moderate pain or  headache.   Marland Kitchen amitriptyline (ELAVIL) 50 MG tablet TAKE 1 TO 2 TABLETS(50 TO 100 MG) BY MOUTH AT BEDTIME   . azelastine (ASTELIN) 0.1 % nasal spray USE 2 SPRAYS IN EACH NOSTRIL TWICE DAILY   . diclofenac sodium (VOLTAREN) 1 % GEL Apply 1 application topically 4 (four) times daily as needed (pain).    Marland Kitchen diclofenac Sodium (VOLTAREN) 1 % GEL Apply 4 g topically 4 (four) times daily.   . isosorbide mononitrate (IMDUR) 60 MG 24 hr tablet Take 60 mg by mouth daily.   . magnesium gluconate (MAGONATE) 500 MG tablet Take 500 mg by mouth daily.   . meloxicam (MOBIC) 7.5 MG tablet TAKE 1 TABLET(7.5 MG) BY MOUTH DAILY   . metoprolol succinate  (TOPROL-XL) 25 MG 24 hr tablet Take 25 mg by mouth daily.   . montelukast (SINGULAIR) 10 MG tablet TAKE 1 TABLET(10 MG) BY MOUTH DAILY   . nystatin cream (MYCOSTATIN) Apply 1 application topically 2 (two) times daily.   . pregabalin (LYRICA) 150 MG capsule Take 1 capsule (150 mg total) by mouth 2 (two) times daily.   . primidone (MYSOLINE) 50 MG tablet TAKE 2 TABLETS(100 MG) BY MOUTH DAILY   . sucralfate (CARAFATE) 1 g tablet TAKE 1 TABLET(1 GRAM) BY MOUTH THREE TIMES DAILY AS NEEDED   . [DISCONTINUED] rosuvastatin (CRESTOR) 20 MG tablet Take 20 mg by mouth daily.    No facility-administered encounter medications on file as of 10/18/2019.     Objective:   Goals Addressed              This Visit's Progress   .  PharmD "I want to take care of my sugars" (pt-stated)        CARE PLAN ENTRY (see longtitudinal plan of care for additional care plan information)  Current Barriers:  . Diabetes: uncontrolled; most recent A1c 10.2%; follows w/ [name] . Current antihyperglycemic regimen: metformin 1000 mg BID, Ozempic 1 mg weekly, Jardiance 25 mg daily, Lantus 90 units daily(pen only goes up to 80 units, so doing 80 units then 10 units); Jardiance  started since GLP1 is at max dose . Current glucose readings:  o Fastings: 140-150s; 150 this am. 2 hour after meal > 300 after pizza for dinner per patient report  . Current meal patterns: o Switched to no-sugar ice cream o Drinks ~three 16oz diet pepsi daily. Eggs & bacon for breakfast, macaroni & tomatoes, bbq pork chops typical of evening meal . Current exercise: none, limited by chronic lower back pain. RN CM, clinical staff, and PCP are collaborating on mobility needs and DME scooter . Cardiovascular risk reduction (CAD s/p stent placement 08/16/2018 at [facility]); follows w/ Dr. Clayborn [name] o Current hypertensive regimen: furosemide 20 mg BID, losartan 50 mg daily, metoprolol succinate 25 mg daily, isosorbide 60 mg daily; not  checking BP at home, but well controlled at last endocrinology and pain management appointments o Current hyperlipidemia regimen: rosuvastatin 20 mg daily; last LDL well controlled at goal <70 o Current antiplatelet regimen: ASA 81 mg daily, clopidogrel 75 mg daily (likely DAPT duration x 1 year)  . GERD: pantoprazole 40 mg daily, famotidine 40 mg daily per GI  . Depression/chronic pain: amitriptyline 50 mg QPM, citalopram 10 mg daily, primidone 100 mg daily, pregabalin 150 mg BID, bupropion XL 150, meloxicam 7.5 mg daily  . Asthma/Allergic rhinitis: [place] 500/50 BID, albuterol PRN; notes no need for albuterol recently, montelukast 10 mg daily, cetirizine 10 mg daily   Pharmacist  Clinical Goal(s):  Marland Kitchen Over the next 90 days, patient with work with PharmD and primary care provider to address optimized glycemic management  Interventions: . Comprehensive medication review performed, medication list updated in electronic medical record . Inter-disciplinary care team collaboration (see longitudinal plan of care) . Patient is still taking 2 injections of Lantus pen to get 90 units. Reinforced recommendations from previous PharmD to discuss changing to [place] U200 or Lantus 40 u bid with endocrinology. . Encouraged to continue to cut back on sweets, sodas, and decrease portion sizes.  . Encouraged patient to increase exercise as tolerated.  Patient Self Care Activities:  . Patient will check blood glucose BID, document, and provide at future appointments . Patient will take medications as prescribed . Patient will report any questions or concerns to provider   Please see past updates related to this goal by clicking on the "Past Updates" button in the selected goal          Plan:   Telephone follow up appointment with PharmD scheduled for: 2 months  Junita Push. Kenton Kingfisher PharmD, [facility] [phone]

## 2019-11-10 NOTE — Patient Instructions (Addendum)
Visit Information  It was a pleasure speaking with you today. Thank you for letting me be part of your clinical team. Please call with any questions or concerns.   Goals Addressed              This Visit's Progress   .  PharmD "I want to take care of my sugars" (pt-stated)        CARE PLAN ENTRY (see longtitudinal plan of care for additional care plan information)  Current Barriers:  . Diabetes: uncontrolled; most recent A1c 10.2%; follows w/ [name] . Current antihyperglycemic regimen: metformin 1000 mg BID, Ozempic 1 mg weekly, Jardiance 25 mg daily, Lantus 90 units daily(pen only goes up to 80 units, so doing 80 units then 10 units); Jardiance  started since GLP1 is at max dose . Current glucose readings:  o Fastings: 140-150s; 150 this am. 2 hour after meal > 300 after pizza for dinner per patient report  . Current meal patterns: o Switched to no-sugar ice cream o Drinks ~three 16oz diet pepsi daily. Eggs & bacon for breakfast, macaroni & tomatoes, bbq pork chops typical of evening meal . Current exercise: none, limited by chronic lower back pain. RN CM, clinical staff, and PCP are collaborating on mobility needs and DME scooter . Cardiovascular risk reduction (CAD s/p stent placement 08/16/2018 at [facility]); follows w/ Dr. Juliann Pares o Current hypertensive regimen: furosemide 20 mg BID, losartan 50 mg daily, metoprolol succinate 25 mg daily, isosorbide 60 mg daily; not checking BP at home, but well controlled at last endocrinology and pain management appointments o Current hyperlipidemia regimen: rosuvastatin 20 mg daily; last LDL well controlled at goal <70 o Current antiplatelet regimen: ASA 81 mg daily, clopidogrel 75 mg daily (likely DAPT duration x 1 year)  . GERD: pantoprazole 40 mg daily, famotidine 40 mg daily per GI  . Depression/chronic pain: amitriptyline 50 mg QPM, citalopram 10 mg daily, primidone 100 mg daily, pregabalin 150 mg BID, bupropion XL 150,  meloxicam 7.5 mg daily  . Asthma/Allergic rhinitis: Wixela 500/50 BID, albuterol PRN; notes no need for albuterol recently, montelukast 10 mg daily, cetirizine 10 mg daily   Pharmacist Clinical Goal(s):  Marland Kitchen Over the next 90 days, patient with work with PharmD and primary care provider to address optimized glycemic management  Interventions: . Comprehensive medication review performed, medication list updated in electronic medical record . Inter-disciplinary care team collaboration (see longitudinal plan of care) . Patient is still taking 2 injections of Lantus pen to get 90 units. Reinforced recommendations from previous PharmD to discuss changing to [place] U200 or Lantus 40 u bid with endocrinology. . Encouraged to continue to cut back on sweets, sodas, and decrease portion sizes.  . Encouraged patient to increase exercise as tolerated.  Patient Self Care Activities:  . Patient will check blood glucose BID, document, and provide at future appointments . Patient will take medications as prescribed . Patient will report any questions or concerns to provider   Please see past updates related to this goal by clicking on the "Past Updates" button in the selected goal         The patient verbalized understanding of instructions provided today and agreed to receive a mailed copy of patient instruction and/or educational materials.  Telephone follow up appointment with pharmacy team member scheduled for: 12/09/19  Mercer Pod. Tiburcio Pea PharmD, BCPS Clinical Pharmacist [phone]  Hypertension, Adult Hypertension is another name for high blood pressure. High blood pressure forces your heart to  work harder to pump blood. This can cause problems over time. There are two numbers in a blood pressure reading. There is a top number (systolic) over a bottom number (diastolic). It is best to have a blood pressure that is below 120/80. Healthy choices can help lower your blood pressure, or you may need  medicine to help lower it. What are the causes? The cause of this condition is not known. Some conditions may be related to high blood pressure. What increases the risk?  Smoking.  Having type 2 diabetes mellitus, high cholesterol, or both.  Not getting enough exercise or physical activity.  Being overweight.  Having too much fat, sugar, calories, or salt (sodium) in your diet.  Drinking too much alcohol.  Having long-term (chronic) kidney disease.  Having a family history of high blood pressure.  Age. Risk increases with age.  Race. You may be at higher risk if you are African American.  Gender. Men are at higher risk than women before age 44. After age 36, women are at higher risk than men.  Having obstructive sleep apnea.  Stress. What are the signs or symptoms?  High blood pressure may not cause symptoms. Very high blood pressure (hypertensive crisis) may cause: ? Headache. ? Feelings of worry or nervousness (anxiety). ? Shortness of breath. ? Nosebleed. ? A feeling of being sick to your stomach (nausea). ? Throwing up (vomiting). ? Changes in how you see. ? Very bad chest pain. ? Seizures. How is this treated?  This condition is treated by making healthy lifestyle changes, such as: ? Eating healthy foods. ? Exercising more. ? Drinking less alcohol.  Your health care provider may prescribe medicine if lifestyle changes are not enough to get your blood pressure under control, and if: ? Your top number is above 130. ? Your bottom number is above 80.  Your personal target blood pressure may vary. Follow these instructions at home: Eating and drinking   If told, follow the DASH eating plan. To follow this plan: ? Fill one half of your plate at each meal with fruits and vegetables. ? Fill one fourth of your plate at each meal with whole grains. Whole grains include whole-wheat pasta, brown rice, and whole-grain bread. ? Eat or drink low-fat dairy products,  such as skim milk or low-fat yogurt. ? Fill one fourth of your plate at each meal with low-fat (lean) proteins. Low-fat proteins include fish, chicken without skin, eggs, beans, and tofu. ? Avoid fatty meat, cured and processed meat, or chicken with skin. ? Avoid pre-made or processed food.  Eat less than 1,500 mg of salt each day.  Do not drink alcohol if: ? Your doctor tells you not to drink. ? You are pregnant, may be pregnant, or are planning to become pregnant.  If you drink alcohol: ? Limit how much you use to:  0-1 drink a day for women.  0-2 drinks a day for men. ? Be aware of how much alcohol is in your drink. In the [place] one drink equals one 12 oz bottle of beer (355 mL), one 5 oz glass of wine (148 mL), or one 1 oz glass of hard liquor (44 mL). Lifestyle   Work with your doctor to stay at a healthy weight or to lose weight. Ask your doctor what the best weight is for you.  Get at least 30 minutes of exercise most days of the week. This may include walking, swimming, or biking.  Get at least 30  minutes of exercise that strengthens your muscles (resistance exercise) at least 3 days a week. This may include lifting weights or doing Pilates.  Do not use any products that contain nicotine or tobacco, such as cigarettes, e-cigarettes, and chewing tobacco. If you need help quitting, ask your doctor.  Check your blood pressure at home as told by your doctor.  Keep all follow-up visits as told by your doctor. This is important. Medicines  Take over-the-counter and prescription medicines only as told by your doctor. Follow directions carefully.  Do not skip doses of blood pressure medicine. The medicine does not work as well if you skip doses. Skipping doses also puts you at risk for problems.  Ask your doctor about side effects or reactions to medicines that you should watch for. Contact a doctor if you:  Think you are having a reaction to the medicine you are  taking.  Have headaches that keep coming back (recurring).  Feel dizzy.  Have swelling in your ankles.  Have trouble with your vision. Get help right away if you:  Get a very bad headache.  Start to feel mixed up (confused).  Feel weak or numb.  Feel faint.  Have very bad pain in your: ? Chest. ? Belly (abdomen).  Throw up more than once.  Have trouble breathing. Summary  Hypertension is another name for high blood pressure.  High blood pressure forces your heart to work harder to pump blood.  For most people, a normal blood pressure is less than 120/80.  Making healthy choices can help lower blood pressure. If your blood pressure does not get lower with healthy choices, you may need to take medicine. This information is not intended to replace advice given to you by your health care provider. Make sure you discuss any questions you have with your health care provider. Document Revised: 09/27/2017 Document Reviewed: 09/27/2017 Elsevier Patient Education  2020 [employer].

## 2019-11-15 ENCOUNTER — Ambulatory Visit (INDEPENDENT_AMBULATORY_CARE_PROVIDER_SITE_OTHER): Payer: 59

## 2019-11-15 VITALS — Ht 68.0 in | Wt 212.0 lb

## 2019-11-15 DIAGNOSIS — Z0000 Encounter for general adult medical examination without abnormal findings: Secondary | ICD-10-CM

## 2019-11-15 NOTE — Progress Notes (Signed)
I connected with Ean Gettel today by telephone and verified that I am speaking with the correct person using two identifiers. Location patient: home Location provider: work Persons participating in the virtual visit: Mclane Arora, Glenna Durand LPN.   I discussed the limitations, risks, security and privacy concerns of performing an evaluation and management service by telephone and the availability of in person appointments. I also discussed with the patient that there may be a patient responsible charge related to this service. The patient expressed understanding and verbally consented to this telephonic visit.    Interactive audio and video telecommunications were attempted between this provider and patient, however failed, due to patient having technical difficulties OR patient did not have access to video capability.  We continued and completed visit with audio only.     Vital signs may be patient reported or missing.  Subjective:   Corey [name] is a 64 y.o. male who presents for Medicare Annual/Subsequent preventive examination.  Review of Systems     Cardiac Risk Factors include: advanced age (>64mn, >>26women);diabetes mellitus;dyslipidemia;hypertension;male gender;smoking/ tobacco exposure     Objective:    Today's Vitals   11/15/19 1111  Weight: 212 lb (96.2 kg)  Height: 5' 8"  (1.727 m)   Body mass index is 32.23 kg/m.  Advanced Directives 11/15/2019 09/17/2019 07/30/2019 06/14/2019 04/16/2019 12/06/2018 11/07/2018  Does Patient Have a Medical Advance Directive? No No No No No No No  Would patient like information on creating a medical advance directive? - No - Patient declined No - Patient declined - - - Yes (MAU/Ambulatory/Procedural Areas - Information given)    Current Medications (verified) Outpatient Encounter Medications as of 11/15/2019  Medication Sig  . Accu-Chek FastClix Lancets MISC USE TO CHECK BLOOD SUGAR  . ACCU-CHEK GUIDE test strip USE TO  CHECK BLOOD SUGAR TID  . acetaminophen (TYLENOL) 500 MG tablet Take 1,000 mg by mouth every 6 (six) hours as needed for moderate pain or headache.  . albuterol (PROVENTIL) (2.5 MG/3ML) 0.083% nebulizer solution USE 1 VIAL IN NEBULIZER EVERY 6 HOURS - and as needed.  .Marland Kitchenalbuterol (VENTOLIN HFA) 108 (90 Base) MCG/ACT inhaler INHALE 2 PUFFS INTO THE LUNGS EVERY 6 HOURS AS NEEDED FOR WHEEZING OR SHORTNESS OF BREATH  . amitriptyline (ELAVIL) 50 MG tablet TAKE 1 TO 2 TABLETS(50 TO 100 MG) BY MOUTH AT BEDTIME  . aspirin EC 81 MG tablet Take 81 mg by mouth daily.  .Marland Kitchenazelastine (ASTELIN) 0.1 % nasal spray USE 2 SPRAYS IN EACH NOSTRIL TWICE DAILY  . B-D UF III MINI PEN NEEDLES 31G X 5 MM MISC USE TWICE DAILY  . Blood Glucose Monitoring Suppl (ACCU-CHEK GUIDE) w/Device KIT U UTD  . buPROPion (WELLBUTRIN XL) 150 MG 24 hr tablet TAKE 1 TABLET(150 MG) BY MOUTH DAILY  . cetirizine (ZYRTEC) 10 MG tablet TAKE 1 TABLET BY MOUTH DAILY  . Cholecalciferol (VITAMIN D3) 5000 units TABS Take 5,000 Units by mouth daily.   . citalopram (CELEXA) 10 MG tablet TAKE 1 TABLET BY MOUTH  DAILY  . diclofenac sodium (VOLTAREN) 1 % GEL Apply 1 application topically 4 (four) times daily as needed (pain).   .Marland Kitchendiclofenac Sodium (VOLTAREN) 1 % GEL Apply 4 g topically 4 (four) times daily.  . famotidine (PEPCID) 40 MG tablet Take 40 mg by mouth daily.  . Fluticasone-Salmeterol (ADVAIR) 500-50 MCG/DOSE AEPB Inhale 1 puff into the lungs 2 (two) times daily.  . furosemide (LASIX) 20 MG tablet TAKE 1 TABLET(20 MG) BY MOUTH TWICE  DAILY  . isosorbide mononitrate (IMDUR) 60 MG 24 hr tablet Take 60 mg by mouth daily.  Marland Kitchen JARDIANCE 25 MG TABS tablet Take 25 mg by mouth daily.  . Lancets Misc. (ACCU-CHEK FASTCLIX LANCET) KIT 1 Units by Does not apply route 2 (two) times daily.  Marland Kitchen LANTUS SOLOSTAR 100 UNIT/ML Solostar Pen INJECT 75 UNITS [place] D. (Patient taking differently: 90 Units. INJECT 75 UNITS [place] D.)  . losartan (COZAAR) 50 MG tablet Take 50 mg  by mouth daily.  . magnesium gluconate (MAGONATE) 500 MG tablet Take 500 mg by mouth daily.  . meloxicam (MOBIC) 7.5 MG tablet TAKE 1 TABLET(7.5 MG) BY MOUTH DAILY  . metFORMIN (GLUCOPHAGE) 1000 MG tablet TAKE 1 TABLET BY MOUTH  TWICE DAILY  . metoprolol succinate (TOPROL-XL) 25 MG 24 hr tablet Take 25 mg by mouth daily.  . montelukast (SINGULAIR) 10 MG tablet TAKE 1 TABLET(10 MG) BY MOUTH DAILY  . NEEDLE, DISP, 18 G 18G X 1" MISC Use as directed  . nystatin cream (MYCOSTATIN) Apply 1 application topically 2 (two) times daily.  . pantoprazole (PROTONIX) 40 MG tablet Take 1 tablet (40 mg total) by mouth 2 (two) times daily before a meal.  . pregabalin (LYRICA) 150 MG capsule Take 1 capsule (150 mg total) by mouth 2 (two) times daily.  . primidone (MYSOLINE) 50 MG tablet TAKE 2 TABLETS(100 MG) BY MOUTH DAILY  . rosuvastatin (CRESTOR) 20 MG tablet TAKE 1 TABLET BY MOUTH  DAILY  . Semaglutide, 1 MG/DOSE, (OZEMPIC, 1 MG/DOSE,) 2 MG/1.5ML SOPN Inject 1 mg into the skin once a week.  . sucralfate (CARAFATE) 1 g tablet TAKE 1 TABLET(1 GRAM) BY MOUTH THREE TIMES DAILY AS NEEDED  . SYRINGE-NEEDLE, DISP, 3 ML (LUER LOCK SAFETY SYRINGES) 21G X 1-1/2" 3 ML MISC Use as directed for testosterone administration  . testosterone cypionate (DEPOTESTOSTERONE CYPIONATE) 200 MG/ML injection INJECT 1 ML IN THE MUSCLE EVERY 14 DAYS   No facility-administered encounter medications on file as of 11/15/2019.    Allergies (verified) Patient has no known allergies.   History: Past Medical History:  Diagnosis Date  . Asthma   . Diabetes ([place])   . GERD (gastroesophageal reflux disease)   . Hyperlipidemia   . Hypertension   . Legg-Perthes disease    Past Surgical History:  Procedure Laterality Date  . CARDIAC CATHETERIZATION    . COLONOSCOPY WITH PROPOFOL N/A 04/16/2019   Procedure: COLONOSCOPY WITH PROPOFOL;  Surgeon: Jonathon Bellows, MD;  [facility] ENDOSCOPY;  Service: Gastroenterology;  Laterality: N/A;  .  COLONOSCOPY WITH PROPOFOL N/A 06/14/2019   Procedure: COLONOSCOPY WITH PROPOFOL;  Surgeon: Jonathon Bellows, MD;  Location: [facility] ENDOSCOPY;  Service: Gastroenterology;  Laterality: N/A;  . COLONOSCOPY WITH PROPOFOL N/A 07/30/2019   Procedure: COLONOSCOPY WITH PROPOFOL;  Surgeon: Jonathon Bellows, MD;  Location: [facility] ENDOSCOPY;  Service: Gastroenterology;  Laterality: N/A;  . ESOPHAGOGASTRODUODENOSCOPY (EGD) WITH PROPOFOL N/A 04/16/2019   Procedure: ESOPHAGOGASTRODUODENOSCOPY (EGD) WITH PROPOFOL;  Surgeon: Jonathon Bellows, MD;  Location: [facility] ENDOSCOPY;  Service: Gastroenterology;  Laterality: N/A;  . Foot Surgery    . HIP SURGERY    . RIGHT/LEFT HEART CATH AND CORONARY ANGIOGRAPHY N/A 07/25/2018   Procedure: RIGHT/LEFT HEART CATH AND CORONARY ANGIOGRAPHY;  Surgeon: Yolonda Kida, MD;  Location: [place] CV LAB;  Service: Cardiovascular;  Laterality: N/A;   Family History  Problem Relation Age of Onset  . Cancer Mother   . Heart disease Mother   . Diabetes Father    Social History  Socioeconomic History  . Marital status: Divorced    Spouse name: Not on file  . Number of children: Not on file  . Years of education: Not on file  . Highest education level: 8th grade  Occupational History  . Occupation: disability  Tobacco Use  . Smoking status: Former Smoker    Packs/day: 0.50    Types: Cigarettes  . Smokeless tobacco: Current User    Types: Chew  . Tobacco comment: quit 40 years ago   Vaping Use  . Vaping Use: Never used  Substance and Sexual Activity  . Alcohol use: Not Currently  . Drug use: Never  . Sexual activity: Not on file  Other Topics Concern  . Not on file  Social History Narrative  . Not on file   Social Determinants of Health   Financial Resource Strain: Low Risk   . Difficulty of Paying Living Expenses: Not hard at all  Food Insecurity: No Food Insecurity  . Worried About Charity fundraiser in the Last Year: Never true  . Ran Out of Food in the Last Year:  Never true  Transportation Needs: No Transportation Needs  . Lack of Transportation (Medical): No  . Lack of Transportation (Non-Medical): No  Physical Activity: Insufficiently Active  . Days of Exercise per Week: 2 days  . Minutes of Exercise per Session: 60 min  Stress: No Stress Concern Present  . Feeling of Stress : Not at all  Social Connections:   . Frequency of Communication with Friends and Family: Not on file  . Frequency of Social Gatherings with Friends and Family: Not on file  . Attends Religious Services: Not on file  . Active Member of Clubs or Organizations: Not on file  . Attends Archivist Meetings: Not on file  . Marital Status: Not on file    Tobacco Counseling Ready to quit: Not Answered Counseling given: Not Answered Comment: quit 40 years ago    Clinical Intake:  Pre-visit preparation completed: Yes  Pain : No/denies pain     Nutritional Status: BMI > 30  Obese Nutritional Risks: None Diabetes: Yes  How often do you need to have someone help you when you read instructions, pamphlets, or other written materials from your doctor or pharmacy?: 1 - Never What is the last grade level you completed in school?: 8th grade  Diabetic? Yes Nutrition Risk Assessment:  Has the patient had any N/V/D within the last 2 months?  No  Does the patient have any non-healing wounds?  No  Has the patient had any unintentional weight loss or weight gain?  No   Diabetes:  Is the patient diabetic?  Yes  If diabetic, was a CBG obtained today?  No  Did the patient bring in their glucometer from home?  No  How often do you monitor your CBG's? Twice daily.   Financial Strains and Diabetes Management:  Are you having any financial strains with the device, your supplies or your medication? No .  Does the patient want to be seen by Chronic Care Management for management of their diabetes?  No  Would the patient like to be referred to a Nutritionist or for  Diabetic Management?  No   Diabetic Exams:  Diabetic Eye Exam: Overdue for diabetic eye exam. Pt has been advised about the importance in completing this exam. Patient advised to call and schedule an eye exam. Diabetic Foot Exam: Overdue, Pt has been advised about the importance in completing this exam.  Pt is scheduled for diabetic foot exam on next appointment.   Interpreter Needed?: No  Information entered by :: NAllen LPN   Activities of Daily Living In your present state of health, do you have any difficulty performing the following activities: 11/15/2019 12/24/2018  Hearing? N N  Vision? N N  Difficulty concentrating or making decisions? N N  Walking or climbing stairs? N N  Dressing or bathing? N N  Doing errands, shopping? N N  Preparing Food and eating ? N -  Using the Toilet? N -  In the past six months, have you accidently leaked urine? N -  Do you have problems with loss of bowel control? N -  Managing your Medications? N -  Managing your Finances? N -  Housekeeping or managing your Housekeeping? N -  Some recent data might be hidden    Patient Care Team: Volney American, PA-C as PCP - General (Family Medicine) Alyce Pagan as [place] Management (Licensed Clinical Social Worker) Vanita Ingles, RN as Case Manager (General Practice)  Indicate any recent [place] you may have received from other than [facility] providers in the past year (date may be approximate).     Assessment:   This is a routine wellness examination for K[place].  Hearing/Vision screen  Hearing Screening   125Hz  250Hz  500Hz  1000Hz  2000Hz  3000Hz  4000Hz  6000Hz  8000Hz   Right ear:           Left ear:           Vision Screening Comments: Regular eye exams,   Dietary issues and exercise activities discussed: Current Exercise Habits: Home exercise routine, Type of exercise: walking, Time (Minutes): 60, Frequency (Times/Week): 2, Weekly Exercise  (Minutes/Week): 120  Goals    .  "SW- "I need some more resource education." (pt-stated)      Current Barriers:  . Financial constraints related to managing health care and affording bills . ADL IADL limitations  Clinical Social Work Clinical Goal(s):  Marland Kitchen Over the next 120 days, patient will work with SW to address concerns related to lack of financial, personal care, transportation resource education.   Interventions: . Discussed plans with patient for ongoing care management follow up and provided patient with direct contact information for care management team . Advised patient to contact [facility]FP if future transportation issues arise . Assisted patient/caregiver with obtaining information about health plan benefits . Provided education and assistance to client regarding Advanced Directives. . Provided education to patient/caregiver about Hospice and/or Palliative Care services . Patient complains ongoing chronic pain in his knees and back. Reflective listening provided.  Marland Kitchen LCSW provided education on local community resources within his area that would benefit himself and spouse.  . Patient confirms that he is actively working with [employer] C3 Guide and has provided needed information for application completion for financial assistance.  . Patient was educated on appropriate self-care coping tools to implement into his daily routine to promote healthy living. . Patient denies any urgent SW needs at this time  Patient Self Care Activities:  . Calls provider office for new concerns or questions . Lacks social connections  Please see past updates related to this goal by clicking on the "Past Updates" button in the selected goal      .  DIET - INCREASE WATER INTAKE      Recommend drinking at least 6-8 glasses of water a day     .  Patient Stated  11/15/2019, wants to get A1C down    .  PharmD "I want to take care of my sugars" (pt-stated)      CARE PLAN ENTRY (see longtitudinal plan of  care for additional care plan information)  Current Barriers:  . Diabetes: uncontrolled; most recent A1c 10.2%; follows w/ [name] . Current antihyperglycemic regimen: metformin 1000 mg BID, Ozempic 1 mg weekly, Jardiance 25 mg daily, Lantus 90 units daily(pen only goes up to 80 units, so doing 80 units then 10 units); Jardiance  started since GLP1 is at max dose . Current glucose readings:  o Fastings: 140-150s; 150 this am. 2 hour after meal > 300 after pizza for dinner per patient report  . Current meal patterns: o Switched to no-sugar ice cream o Drinks ~three 16oz diet pepsi daily. Eggs & bacon for breakfast, macaroni & tomatoes, bbq pork chops typical of evening meal . Current exercise: none, limited by chronic lower back pain. RN CM, clinical staff, and PCP are collaborating on mobility needs and DME scooter . Cardiovascular risk reduction (CAD s/p stent placement [facility]/16/2018 at [facility] follows w/ Dr. Clayborn Bigness o Current hypertensive regimen: furosemide 20 mg BID, losartan 50 mg daily, metoprolol succinate 25 mg daily, isosorbide 60 mg daily; not checking BP at home, but well controlled at last endocrinology and pain management appointments o Current hyperlipidemia regimen: rosuvastatin 20 mg daily; last LDL well controlled at goal <70 o Current antiplatelet regimen: ASA 81 mg daily, clopidogrel 75 mg daily (likely DAPT duration x 1 year)  . GERD: pantoprazole 40 mg daily, famotidine 40 mg daily per GI  . Depression/chronic pain: amitriptyline 50 mg QPM, citalopram 10 mg daily, primidone 100 mg daily, pregabalin 150 mg BID, bupropion XL 150, meloxicam 7.5 mg daily  . Asthma/Allergic rhinitis: [place] 500/50 BID, albuterol PRN; notes no need for albuterol recently, montelukast 10 mg daily, cetirizine 10 mg daily   Pharmacist Clinical Goal(s):  Marland Kitchen Over the next 90 days, patient with work with PharmD and primary care provider to address optimized glycemic  management  Interventions: . Comprehensive medication review performed, medication list updated in electronic medical record . Inter-disciplinary care team collaboration (see longitudinal plan of care) . Patient is still taking 2 injections of Lantus pen to get 90 units. Reinforced recommendations from previous PharmD to discuss changing to [place] U200 or Lantus 40 u bid with endocrinology. . Encouraged to continue to cut back on sweets, sodas, and decrease portion sizes.  . Encouraged patient to increase exercise as tolerated.  Patient Self Care Activities:  . Patient will check blood glucose BID, document, and provide at future appointments . Patient will take medications as prescribed . Patient will report any questions or concerns to provider   Please see past updates related to this goal by clicking on the "Past Updates" button in the selected goal      .  RNCM-Pt-"I am taking my blood pressure at home" (pt-stated)      [place] (see longtitudinal plan of care for additional care plan information)  Current Barriers:  . Chronic Disease Management support, education, and care coordination needs related to HTN and HLD  Clinical Goal(s) related to HTN and HLD:  Over the next 120 days, patient will:  . Work with the care management team to address educational, disease management, and care coordination needs  . Begin or continue self health monitoring activities as directed today Measure and record blood pressure 3/4 times per week and adhere to a  heart healthy/ADA diet . Call provider office for new or worsened signs and symptoms Blood pressure findings outside established parameters, Oxygen saturation lower than established parameter, Chest pain, Shortness of breath, and New or worsened symptom related to HLD and chronic conditions . Call care management team with questions or concerns . Verbalize basic understanding of patient centered plan of care established  today  Interventions related to HTN and HLD:  . Evaluation of current treatment plans and patient's adherence to plan as established by provider.  States compliance with medications and plan of care. . Assessed patient understanding of disease states.  Review of blood pressure readings. The patient did not have any readings for the [facility] but states it is "good". Education on taking blood pressures regularly and recording, having list available at [employer]. 11-08-2019: The patient states he is not taking his blood pressures. He forgets. Ask the patient to start taking blood pressure at least 2 times a week and writing the numbers down for the [facility].  Also ask the patient wife Horris Latino to help with reminding the patient to take his blood pressure and record.  . Assessed patient's education and care coordination needs.  The patient needs consistent reminders to follow plan of care for health and wellness. 11-08-2019: The patient states on outreaches he wants to be healthy and maintain his health and well being. The patient has to have frequent reinforcement to stay on track with lifestyle changes to help control his chronic conditions. Will continue to work with the patient to help the patient reach his health and wellness goals.  . Provided disease specific education to patient.  Education and support on Heart heatlhy/ADA diet, taking bp and glucose readings consistently and writing them down. The patient denies any issues at this time. 11-08-2019: Will send new educational material by my chart and EMMI system for the patient on management of chronic conditions. Also will send information by mail to the patient on healthy eating.  Nash Dimmer with appropriate clinical care team members regarding patient needs.  Ongoing support from [facility] team pharmacist and LCSW.   Patient Self Care Activities related to HTN and HLD:  . Patient is unable to independently self-manage chronic health conditions  Please see past  updates related to this goal by clicking on the "Past Updates" button in the selected goal      .  RNCM:"My sugar was up this am because I ate grapes" (pt-stated)      Current Barriers:  Marland Kitchen Knowledge Deficits related to negative impact on health and well being in a patient with chronic disease processes and not following a prescribed Heart healthy/ADA diet . Film/video editor.  . Transportation barriers  Nurse Case Manager Clinical Goal(s):  Marland Kitchen Over the next 120 days, patient will verbalize understanding of plan for following a Heart healthy/ADA diet . Over the next 120 days, patient will work with [place], pcp and CCM team to address needs related to dietary restrictions and following a heart healthy/ADA diet . Over the next 120 days, patient will demonstrate a decrease in hyperglycemic exacerbations as evidenced by decreased blood sugar readings and decrease hemoglobin A1C levels . Over the next 120 days, patient will demonstrate improved health management independence as evidenced bymaking healthy food choice, watching hidden salts and sugars, continuing to monitor blood sugars . Over the next 120 days, patient will work with CM team pharmacist to monitor medications. The patient continues to work with the pharmacist to meet needs . Over the next  90 days, patient will work with CM clinical social worker to assist with resources as needed- ongoing support and assistance from the Education officer, museum . Over the next 90 days, patient will work with care guides to get adequate transportation- completed   Interventions:  . Evaluation of current treatment plan related to DM and HTN dietary restrictions and patient's adherence to plan as established by provider. 11-08-2019: The patient saw endocrinologist on 09-16-2019 and his hemoglobin is increasing.  It went from 9.3% in February to 10.2% on E-16-2021.  The patient states he has been eating a lot of bread. Ask the patient for blood sugar readings and he is  having numbers in 300's.  The patient is not consistently following recommendations by the provider.   . Advised patient to monitor his dietary intake.  The patient says his blood sugars vary depending on what he eats. He ate BBQ sandwiches last night, his blood sugar this am was 200.  Education and reiterated the importance of watching salts and sugars in meal planning. 11-08-2019: the patient states that his blood sugars have been "high". When evaluation of high the patient states "300's".  Endorses taking blood sugars daily but did not have list available.  Encouraged the patient to write down readings to have for CCM calls and MD visits. The patient states his weakness is bread. The patient realizes his diabetes is not in good control. Discussed at length dietary restrictions and the need to follow recommendations by provider. The patient had been drinking a lot of drinks but is drinking diet drinks now.  . Provided education to patient re: to following a Heart Healthy/ADA diet.  The patient is aware of blood sugars being elevated and blood pressure elevation with use of high sodium foods. 11-08-2019: Review of heart healthy/ADA diet. The patient verbalized his blood sugars change dependent on what he eats. Th patient is still making poor dietary choices. Education on breads, pastas, and corn being full of carbohydrates. The patient states he knows he has to do better. Education on monitoring dietary intake better and making positive changes to help with better control of diabetes. Education on how at least 15 minutes of moderate exercise can help lower blood sugars. The patient does like to walk. Encouraged walking daily.  Education on the effects uncontrolled DM of body systems, eyes, kidneys, etc.  The patient verbalized understanding. Will send information by mail on healthy food choices and getting blood sugars to goal. Education provided that fasting blood sugar should be <130 and post prandial of <180.   The patient verbalized understanding.  Marland Kitchen Collaborated with pcp, CCM team regarding management of ADA.  Ongoing support and education needed.  . Discussed plans with patient for ongoing care management follow up and provided patient with direct contact information for care management team . Advised patient, providing education and rationale, to monitor blood pressure daily and record, calling pcp for findings outside established parameters.  . Advised patient, providing education and rationale, to check cbg bid and record, calling pcp for findings outside established parameters.   . Review of upcoming appointments. The patient states he will see the endocrinologist again next month. Has an appointment on 01-01-2020 at 0900 am to see Dr. Wynetta Emery. RNCM will plan on seeing the patient face to face on 01-01-2020 to review DM management and provide education and support.    Patient Self Care Activities:  . Patient verbalizes understanding of plan to monitor health and  well being by following prescribed  diet for chronic health conditions . Attends all scheduled provider appointments . Calls provider office for new concerns or questions . Unable to independently manage chronic medical conditions . Does not adhere to provider recommendations re: heart healthy/ADA  Please see past updates related to this goal by clicking on the "Past Updates" button in the selected goal        Depression Screen PHQ 2/9 Scores 11/15/2019 09/17/2019 06/24/2019 12/24/2018 12/06/2018 11/07/2018 06/21/2018  PHQ - 2 Score 0 0 0 0 0 0 0  PHQ- 9 Score 0 - 0 0 - - 0    Fall Risk Fall Risk  11/15/2019 09/17/2019 12/24/2018 12/06/2018 11/07/2018  Falls in the past year? 0 0 0 0 0  Number falls in past yr: - - 0 - -  Injury with Fall? - - 0 - -  Risk for fall due to : Medication side effect - - - -  Follow up Falls evaluation completed;Education provided;Falls prevention discussed - - - Falls evaluation completed    Any stairs in  or around the home? No  If so, are there any without handrails? n/a Home free of loose throw rugs in walkways, pet beds, electrical cords, etc? Yes  Adequate lighting in your home to reduce risk of falls? Yes   ASSISTIVE DEVICES UTILIZED TO PREVENT FALLS:  Life alert? No  Use of a cane, walker or w/c? No  Grab bars in the bathroom? Yes  Shower chair or bench in shower? Yes  Elevated toilet seat or a handicapped toilet? No   TIMED UP AND GO:  Was the test performed? No . .    Cognitive Function:     6CIT Screen 11/15/2019 10/26/2017  What Year? 0 points 0 points  What month? 0 points 0 points  What time? 0 points 0 points  Count back from 20 0 points 0 points  Months in reverse 4 points 0 points  Repeat phrase 0 points 2 points  Total Score 4 2    Immunizations Immunization History  Administered Date(s) Administered  . Influenza,inj,Quad PF,6+ Mos 09/21/2017, 10/13/2018  . Influenza-Unspecified 10/13/2016  . Pneumococcal Polysaccharide-23 02/26/2016  . Tdap 07/25/2017  . Zoster Recombinat (Shingrix) 07/14/2016    TDAP status: Up to date Flu Vaccine status: Up to date Pneumococcal vaccine status: Up to date Covid-19 vaccine status: Declined, Education has been provided regarding the importance of this vaccine but patient still declined. Advised may receive this vaccine at local pharmacy or Health Dept.or vaccine clinic. Aware to provide a copy of the vaccination record if obtained from local pharmacy or Health Dept. Verbalized acceptance and understanding.  Qualifies for Shingles Vaccine? Yes   Zostavax completed No   Shingrix Completed?: No.    Education has been provided regarding the importance of this vaccine. Patient has been advised to call insurance company to determine out of pocket expense if they have not yet received this vaccine. Advised may also receive vaccine at local pharmacy or Health Dept. Verbalized acceptance and understanding.  Screening  Tests Health Maintenance  Topic Date Due  . COVID-19 Vaccine (1) Never done  . FOOT EXAM  07/26/2018  . INFLUENZA VACCINE  09/01/2019  . HEMOGLOBIN A1C  09/15/2019  . OPHTHALMOLOGY EXAM  09/21/2019  . TETANUS/TDAP  07/26/2027  . COLONOSCOPY  07/29/2029  . PNEUMOCOCCAL POLYSACCHARIDE VACCINE AGE 20-64 HIGH RISK  Completed  . Hepatitis C Screening  Completed  . HIV Screening  Completed    Health Maintenance  Health Maintenance Due  Topic Date Due  . COVID-19 Vaccine (1) Never done  . FOOT EXAM  07/26/2018  . INFLUENZA VACCINE  09/01/2019  . HEMOGLOBIN A1C  09/15/2019  . OPHTHALMOLOGY EXAM  09/21/2019    Colorectal cancer screening: Completed 07/30/2019. Repeat every 10 years  Lung Cancer Screening: (Low Dose CT Chest recommended if Age 62-80 years, 30 pack-year currently smoking OR have quit w/in 15years.) does not qualify.   Lung Cancer Screening Referral: no  Additional Screening:  Hepatitis C Screening: does qualify; Completed 07/25/2017  Vision Screening: Recommended annual ophthalmology exams for early detection of glaucoma and other disorders of the eye. Is the patient up to date with their annual eye exam?  No  Who is the provider or what is the name of the office in which the patient attends annual eye exams? Can't remember If pt is not established with a provider, would they like to be referred to a provider to establish care? No .   Dental Screening: Recommended annual dental exams for proper oral hygiene  Community Resource Referral / Chronic Care Management: CRR required this visit?  No   CCM required this visit?  No      Plan:     I have personally reviewed and noted the following in the patient's chart:   . Medical and social history . Use of alcohol, tobacco or illicit drugs  . Current medications and supplements . Functional ability and status . Nutritional status . Physical activity . Advanced directives . List of other  physicians . Hospitalizations, surgeries, and ER visits in previous 12 months . Vitals . Screenings to include cognitive, depression, and falls . Referrals and appointments  In addition, I have reviewed and discussed with patient certain preventive protocols, quality metrics, and best practice recommendations. A written personalized care plan for preventive services as well as general preventive health recommendations were provided to patient.     Kellie Simmering, LPN   [id number]   Nurse Notes:

## 2019-11-15 NOTE — Patient Instructions (Signed)
Corey [name] , Thank you for taking time to come for your Medicare Wellness Visit. I appreciate your ongoing commitment to your health goals. Please review the following plan we discussed and let me know if I can assist you in the future.   Screening recommendations/referrals: Colonoscopy: 07/30/2019 Recommended yearly ophthalmology/optometry visit for glaucoma screening and checkup Recommended yearly dental visit for hygiene and checkup  Vaccinations: Influenza vaccine: due Pneumococcal vaccine: completed 02/26/2016 Tdap vaccine: 07/25/2017 Shingles vaccine: discussed   Covid-19:  decline  Advanced directives: Advance directive discussed with you today..   Conditions/risks identified: chews tobacco  Next appointment: Follow up in one year for your annual wellness visit   Preventive Care 40-64 Years, Male Preventive care refers to lifestyle choices and visits with your health care provider that can promote health and wellness. What does preventive care include?  A yearly physical exam. This is also called an annual well check.  Dental exams once or twice a year.  Routine eye exams. Ask your health care provider how often you should have your eyes checked.  Personal lifestyle choices, including:  Daily care of your teeth and gums.  Regular physical activity.  Eating a healthy diet.  Avoiding tobacco and drug use.  Limiting alcohol use.  Practicing safe sex.  Taking low-dose aspirin every day starting at age 54. What happens during an annual well check? The services and screenings done by your health care provider during your annual well check will depend on your age, overall health, lifestyle risk factors, and family history of disease. Counseling  Your health care provider may ask you questions about your:  Alcohol use.  Tobacco use.  Drug use.  Emotional well-being.  Home and relationship well-being.  Sexual activity.  Eating habits.  Work and work  Astronomer. Screening  You may have the following tests or measurements:  Height, weight, and BMI.  Blood pressure.  Lipid and cholesterol levels. These may be checked every 5 years, or more frequently if you are over 51 years old.  Skin check.  Lung cancer screening. You may have this screening every year starting at age 50 if you have a 30-pack-year history of smoking and currently smoke or have quit within the past 15 years.  Fecal occult blood test (FOBT) of the stool. You may have this test every year starting at age 31.  Flexible sigmoidoscopy or colonoscopy. You may have a sigmoidoscopy every 5 years or a colonoscopy every 10 years starting at age 89.  Prostate cancer screening. Recommendations will vary depending on your family history and other risks.  Hepatitis C blood test.  Hepatitis B blood test.  Sexually transmitted disease (STD) testing.  Diabetes screening. This is done by checking your blood sugar (glucose) after you have not eaten for a while (fasting). You may have this done every 1-3 years. Discuss your test results, treatment options, and if necessary, the need for more tests with your health care provider. Vaccines  Your health care provider may recommend certain vaccines, such as:  Influenza vaccine. This is recommended every year.  Tetanus, diphtheria, and acellular pertussis (Tdap, Td) vaccine. You may need a Td booster every 10 years.  Zoster vaccine. You may need this after age 28.  Pneumococcal 13-valent conjugate (PCV13) vaccine. You may need this if you have certain conditions and have not been vaccinated.  Pneumococcal polysaccharide (PPSV23) vaccine. You may need one or two doses if you smoke cigarettes or if you have certain conditions. Talk to your health  care provider about which screenings and vaccines you need and how often you need them. This information is not intended to replace advice given to you by your health care provider. Make  sure you discuss any questions you have with your health care provider. Document Released: 02/13/2015 Document Revised: 10/07/2015 Document Reviewed: 11/18/2014 Elsevier Interactive Patient Education  2017 [place] Prevention in the Home Falls can cause injuries. They can happen to people of all ages. There are many things you can do to make your home safe and to help prevent falls. What can I do on the outside of my home?  Regularly fix the edges of walkways and driveways and fix any cracks.  Remove anything that might make you trip as you walk through a door, such as a raised step or threshold.  Trim any bushes or trees on the path to your home.  Use bright outdoor lighting.  Clear any walking paths of anything that might make someone trip, such as rocks or tools.  Regularly check to see if handrails are loose or broken. Make sure that both sides of any steps have handrails.  Any raised decks and porches should have guardrails on the edges.  Have any leaves, snow, or ice cleared regularly.  Use sand or salt on walking paths during winter.  Clean up any spills in your garage right away. This includes oil or grease spills. What can I do in the bathroom?  Use night lights.  Install grab bars by the toilet and in the tub and shower. Do not use towel bars as grab bars.  Use non-skid mats or decals in the tub or shower.  If you need to sit down in the shower, use a plastic, non-slip stool.  Keep the floor dry. Clean up any water that spills on the floor as soon as it happens.  Remove soap buildup in the tub or shower regularly.  Attach bath mats securely with double-sided non-slip rug tape.  Do not have throw rugs and other things on the floor that can make you trip. What can I do in the bedroom?  Use night lights.  Make sure that you have a light by your bed that is easy to reach.  Do not use any sheets or blankets that are too big for your bed. They should  not hang down onto the floor.  Have a firm chair that has side arms. You can use this for support while you get dressed.  Do not have throw rugs and other things on the floor that can make you trip. What can I do in the kitchen?  Clean up any spills right away.  Avoid walking on wet floors.  Keep items that you use a lot in easy-to-reach places.  If you need to reach something above you, use a strong step stool that has a grab bar.  Keep electrical cords out of the way.  Do not use floor polish or wax that makes floors slippery. If you must use wax, use non-skid floor wax.  Do not have throw rugs and other things on the floor that can make you trip. What can I do with my stairs?  Do not leave any items on the stairs.  Make sure that there are handrails on both sides of the stairs and use them. Fix handrails that are broken or loose. Make sure that handrails are as long as the stairways.  Check any carpeting to make sure that it is firmly  attached to the stairs. Fix any carpet that is loose or worn.  Avoid having throw rugs at the top or bottom of the stairs. If you do have throw rugs, attach them to the floor with carpet tape.  Make sure that you have a light switch at the top of the stairs and the bottom of the stairs. If you do not have them, ask someone to add them for you. What else can I do to help prevent falls?  Wear shoes that:  Do not have high heels.  Have rubber bottoms.  Are comfortable and fit you well.  Are closed at the toe. Do not wear sandals.  If you use a stepladder:  Make sure that it is fully opened. Do not climb a closed stepladder.  Make sure that both sides of the stepladder are locked into place.  Ask someone to hold it for you, if possible.  Clearly mark and make sure that you can see:  Any grab bars or handrails.  First and last steps.  Where the edge of each step is.  Use tools that help you move around (mobility aids) if they are  needed. These include:  Canes.  Walkers.  Scooters.  Crutches.  Turn on the lights when you go into a dark area. Replace any light bulbs as soon as they burn out.  Set up your furniture so you have a clear path. Avoid moving your furniture around.  If any of your floors are uneven, fix them.  If there are any pets around you, be aware of where they are.  Review your medicines with your doctor. Some medicines can make you feel dizzy. This can increase your chance of falling. Ask your doctor what other things that you can do to help prevent falls. This information is not intended to replace advice given to you by your health care provider. Make sure you discuss any questions you have with your health care provider. Document Released: 11/13/2008 Document Revised: 06/25/2015 Document Reviewed: 02/21/2014 Elsevier Interactive Patient Education  2017 [employer].

## 2019-11-18 ENCOUNTER — Telehealth: Payer: Self-pay | Admitting: Pharmacist

## 2019-11-18 NOTE — Chronic Care Management (AMB) (Signed)
Chronic Care Management Pharmacy Assistant   Name: Corey [name]  MRN: [id number] DOB: 1955/10/22  Reason for Encounter: Diabetes Disease State Call (Attempted)  Patient Questions:  1.  Have you seen any other providers since your last visit? No  2.  Any changes in your medicines or health? No    PCP : Volney American, PA-C  Allergies:  No Known Allergies  Medications: Outpatient Encounter Medications as of 11/18/2019  Medication Sig Note  . Accu-Chek FastClix Lancets MISC USE TO CHECK BLOOD SUGAR   . ACCU-CHEK GUIDE test strip USE TO CHECK BLOOD SUGAR TID   . acetaminophen (TYLENOL) 500 MG tablet Take 1,000 mg by mouth every 6 (six) hours as needed for moderate pain or headache.   . albuterol (PROVENTIL) (2.5 MG/3ML) 0.083% nebulizer solution USE 1 VIAL IN NEBULIZER EVERY 6 HOURS - and as needed. 04/03/2019: Using BID-TID  . albuterol (VENTOLIN HFA) 108 (90 Base) MCG/ACT inhaler INHALE 2 PUFFS INTO THE LUNGS EVERY 6 HOURS AS NEEDED FOR WHEEZING OR SHORTNESS OF BREATH   . amitriptyline (ELAVIL) 50 MG tablet TAKE 1 TO 2 TABLETS(50 TO 100 MG) BY MOUTH AT BEDTIME   . aspirin EC 81 MG tablet Take 81 mg by mouth daily.   Marland Kitchen azelastine (ASTELIN) 0.1 % nasal spray USE 2 SPRAYS IN EACH NOSTRIL TWICE DAILY   . B-D UF III MINI PEN NEEDLES 31G X 5 MM MISC USE TWICE DAILY   . Blood Glucose Monitoring Suppl (ACCU-CHEK GUIDE) w/Device KIT U UTD   . buPROPion (WELLBUTRIN XL) 150 MG 24 hr tablet TAKE 1 TABLET(150 MG) BY MOUTH DAILY   . cetirizine (ZYRTEC) 10 MG tablet TAKE 1 TABLET BY MOUTH DAILY   . Cholecalciferol (VITAMIN D3) 5000 units TABS Take 5,000 Units by mouth daily.    . citalopram (CELEXA) 10 MG tablet TAKE 1 TABLET BY MOUTH  DAILY   . diclofenac sodium (VOLTAREN) 1 % GEL Apply 1 application topically 4 (four) times daily as needed (pain).    Marland Kitchen diclofenac Sodium (VOLTAREN) 1 % GEL Apply 4 g topically 4 (four) times daily.   . famotidine (PEPCID) 40 MG tablet Take 40 mg by  mouth daily.   . Fluticasone-Salmeterol (ADVAIR) 500-50 MCG/DOSE AEPB Inhale 1 puff into the lungs 2 (two) times daily.   . furosemide (LASIX) 20 MG tablet TAKE 1 TABLET(20 MG) BY MOUTH TWICE DAILY   . isosorbide mononitrate (IMDUR) 60 MG 24 hr tablet Take 60 mg by mouth daily.   Marland Kitchen JARDIANCE 25 MG TABS tablet Take 25 mg by mouth daily.   . Lancets Misc. (ACCU-CHEK FASTCLIX LANCET) KIT 1 Units by Does not apply route 2 (two) times daily.   Marland Kitchen LANTUS SOLOSTAR 100 UNIT/ML Solostar Pen INJECT 75 UNITS [place] D. (Patient taking differently: 90 Units. INJECT 75 UNITS [place] D.)   . losartan (COZAAR) 50 MG tablet Take 50 mg by mouth daily.   . magnesium gluconate (MAGONATE) 500 MG tablet Take 500 mg by mouth daily.   . meloxicam (MOBIC) 7.5 MG tablet TAKE 1 TABLET(7.5 MG) BY MOUTH DAILY   . metFORMIN (GLUCOPHAGE) 1000 MG tablet TAKE 1 TABLET BY MOUTH  TWICE DAILY   . metoprolol succinate (TOPROL-XL) 25 MG 24 hr tablet Take 25 mg by mouth daily.   . montelukast (SINGULAIR) 10 MG tablet TAKE 1 TABLET(10 MG) BY MOUTH DAILY   . NEEDLE, DISP, 18 G 18G X 1" MISC Use as directed   . nystatin cream (MYCOSTATIN)  Apply 1 application topically 2 (two) times daily.   . pantoprazole (PROTONIX) 40 MG tablet Take 1 tablet (40 mg total) by mouth 2 (two) times daily before a meal.   . pregabalin (LYRICA) 150 MG capsule Take 1 capsule (150 mg total) by mouth 2 (two) times daily.   . primidone (MYSOLINE) 50 MG tablet TAKE 2 TABLETS(100 MG) BY MOUTH DAILY   . rosuvastatin (CRESTOR) 20 MG tablet TAKE 1 TABLET BY MOUTH  DAILY   . Semaglutide, 1 MG/DOSE, (OZEMPIC, 1 MG/DOSE,) 2 MG/1.5ML SOPN Inject 1 mg into the skin once a week.   . sucralfate (CARAFATE) 1 g tablet TAKE 1 TABLET(1 GRAM) BY MOUTH THREE TIMES DAILY AS NEEDED   . SYRINGE-NEEDLE, DISP, 3 ML (LUER LOCK SAFETY SYRINGES) 21G X 1-1/2" 3 ML MISC Use as directed for testosterone administration   . testosterone cypionate (DEPOTESTOSTERONE CYPIONATE) 200 MG/ML injection INJECT  1 ML IN THE MUSCLE EVERY 14 DAYS    No facility-administered encounter medications on file as of 11/18/2019.    Current Diagnosis: Patient Active Problem List   Diagnosis Date Noted  . Chronic bilateral low back pain with bilateral sciatica 12/06/2018  . Lumbar degenerative disc disease 12/06/2018  . Lumbar facet arthropathy 12/06/2018  . Sacroiliac joint pain 12/06/2018  . Chronic pain syndrome 12/06/2018  . Bilateral primary osteoarthritis of knee 12/06/2018  . GERD (gastroesophageal reflux disease) 11/12/2018  . Hyperlipidemia 02/03/2018  . Type 2 diabetes mellitus with hyperglycemia ([place]) 04/21/2017  . Asthma 04/21/2017  . RLS (restless legs syndrome) 04/21/2017  . Allergic rhinitis 04/21/2017  . Essential hypertension 04/21/2017  . Major depression 04/21/2017  . Tremor 04/21/2017  . Hypogonadism male 04/21/2017  . Headache 04/21/2017    Recent Relevant Labs: Lab Results  Component Value Date/Time   HGBA1C 9.3 03/18/2019 12:00 AM   HGBA1C 9.7 (H) 03/02/2018 09:29 AM   HGBA1C 8.8 (H) 01/29/2018 10:38 AM   MICROALBUR 10 07/25/2017 09:02 AM    Kidney Function Lab Results  Component Value Date/Time   CREATININE 1.07 06/24/2019 10:55 AM   CREATININE 1.02 12/24/2018 10:52 AM   GFRNONAA 74 06/24/2019 10:55 AM   GFRAA 86 06/24/2019 10:55 AM     Goals Addressed   None    11/18/19-Unsuccessful call attempt to the patient regarding his blood sugar monitoring. The patient's phone number is not reachable. Attempted to contact the patient's daughter Donella Stade however her cell number is not reachable as well. CPA will make a second attempt to call the patient in regards of his blood sugar monitoring.   11/21/19-Unsuccessful call attempt to the patient in regards of his blood sugar monitoring; LVM for patient to call back at his earliest convenience.   11/22/19- Third attempt to the patient to follow up regarding his blood sugar monitoring; lvm. CPA will notify Birdena Crandall,  CPP.   Raynelle Highland, [place] Assistant [phone]   Follow-Up:  Pharmacist Review

## 2019-11-25 ENCOUNTER — Telehealth: Payer: Self-pay | Admitting: *Deleted

## 2019-11-25 NOTE — Chronic Care Management (AMB) (Signed)
°  Care Management   Note  11/25/2019 Name: Corey [name] MRN: [id number] DOB: October 27, 1955  Corey [name] is a 64 y.o. year old male who is a primary care patient of Particia Nearing, Cordelia Poche and is actively engaged with the care management team. I reached out to [name] by phone today to assist with re-scheduling an initial visit with the Licensed Clinical Child psychotherapist.  Follow up plan: Unsuccessful telephone outreach attempt made. The care management team will reach out to the patient again over the next 7 days. If patient returns call to provider office, please advise to call Embedded Care Management Care Guide Gwenevere Ghazi at [phone].  Gwenevere Ghazi  Care Guide, Embedded Care Coordination [facility] Management

## 2019-11-29 ENCOUNTER — Other Ambulatory Visit: Payer: Self-pay | Admitting: Family Medicine

## 2019-12-03 NOTE — Chronic Care Management (AMB) (Signed)
  Care Management   Note  12/03/2019 Name: Corey [name] MRN: [id number] DOB: Oct 26, 1955  NAGI FURIO is a 64 y.o. year old male who is a primary care patient of Particia Nearing, Cordelia Poche and is actively engaged with the care management team. I reached out to [name] by phone today to assist with re-scheduling an initial visit with the Licensed Clinical Social Worker  Follow up plan: Unsuccessful telephone outreach attempt made. The care management team will reach out to the patient again over the next 7 days. If patient returns call to provider office, please advise to call Embedded Care Management Care Guide Gwenevere Ghazi at [phone].  Gwenevere Ghazi  Care Guide, Embedded Care Coordination [facility] Management

## 2019-12-09 ENCOUNTER — Telehealth: Payer: Self-pay

## 2019-12-09 NOTE — Chronic Care Management (AMB) (Signed)
  Care Management   Note  12/09/2019 Name: Corey [name] MRN: [id number] DOB: 19-Aug-1955  Corey [name] is a 64 y.o. year old male who is a primary care patient of Particia Nearing, Cordelia Poche and is actively engaged with the care management team. I reached out to [name] by phone today to assist with re-scheduling a follow up visit with the Licensed Clinical Child psychotherapist.  Follow up plan: Unsuccessful telephone outreach attempt made. A HIPAA compliant phone message was left for the patient providing contact information and requesting a return call. Unable to make contact on outreach attempts x 3. PCP Particia Nearing, PA-C. notified via routed documentation in medical record. We have been unable to make contact with the patient for follow up. The care management team is available to follow up with the patient after provider conversation with the patient regarding recommendation for care management engagement and subsequent re-referral to the care management team. The patient has been provided with contact information for the care management team and has been advised to call with any health related questions or concerns. If patient returns call to provider office, please advise to call Embedded Care Management Care Guide Gwenevere Ghazi at [phone].  Gwenevere Ghazi  Care Guide, Embedded Care Coordination [facility] Management  Direct Dial: [phone]

## 2019-12-11 ENCOUNTER — Telehealth: Payer: Self-pay

## 2019-12-13 ENCOUNTER — Telehealth: Payer: 59

## 2019-12-16 ENCOUNTER — Other Ambulatory Visit: Payer: Self-pay | Admitting: Family Medicine

## 2019-12-18 ENCOUNTER — Other Ambulatory Visit: Payer: Self-pay | Admitting: Family Medicine

## 2020-01-01 ENCOUNTER — Ambulatory Visit: Payer: Self-pay | Admitting: General Practice

## 2020-01-01 ENCOUNTER — Telehealth: Payer: Self-pay | Admitting: General Practice

## 2020-01-01 ENCOUNTER — Encounter: Payer: 59 | Admitting: Family Medicine

## 2020-01-01 DIAGNOSIS — E1165 Type 2 diabetes mellitus with hyperglycemia: Secondary | ICD-10-CM

## 2020-01-01 DIAGNOSIS — E782 Mixed hyperlipidemia: Secondary | ICD-10-CM

## 2020-01-01 DIAGNOSIS — I10 Essential (primary) hypertension: Secondary | ICD-10-CM

## 2020-01-01 NOTE — Chronic Care Management (AMB) (Signed)
Chronic Care Management   Follow Up Note   01/01/2020 Name: Corey [name] MRN: [id number] DOB: [birth year]  Referred by: Volney American, PA-C Reason for referral : Chronic Care Management (RNCM Chronic Disease Management and Care Coordination Needs)   Corey [name] is a 64 y.o. year old male who is a primary care patient of Volney American, [place]. The CCM team was consulted for assistance with chronic disease management and care coordination needs.    Review of patient status, including review of consultants reports, relevant laboratory and other test results, and collaboration with appropriate care team members and the patient's provider was performed as part of comprehensive patient evaluation and provision of chronic care management services.    SDOH (Social Determinants of Health) assessments performed: Yes See Care Plan activities for detailed interventions related to Corey [name])     Outpatient Encounter Medications as of 01/01/2020  Medication Sig Note  . Accu-Chek FastClix Lancets MISC USE TO CHECK BLOOD SUGAR   . ACCU-CHEK GUIDE test strip USE TO CHECK BLOOD SUGAR TID   . acetaminophen (TYLENOL) 500 MG tablet Take 1,000 mg by mouth every 6 (six) hours as needed for moderate pain or headache.   . albuterol (PROVENTIL) (2.5 MG/3ML) 0.083% nebulizer solution USE 1 VIAL IN NEBULIZER EVERY 6 HOURS - and as needed. 04/03/2019: Using BID-TID  . albuterol (VENTOLIN HFA) 108 (90 Base) MCG/ACT inhaler INHALE 2 PUFFS INTO THE LUNGS EVERY 6 HOURS AS NEEDED FOR WHEEZING OR SHORTNESS OF BREATH   . amitriptyline (ELAVIL) 50 MG tablet TAKE 1 TO 2 TABLETS(50 TO 100 MG) BY MOUTH AT BEDTIME   . aspirin EC 81 MG tablet Take 81 mg by mouth daily.   Marland Kitchen azelastine (ASTELIN) 0.1 % nasal spray USE 2 SPRAYS IN EACH NOSTRIL TWICE DAILY   . B-D UF III MINI PEN NEEDLES 31G X 5 MM MISC USE TWICE DAILY   . Blood Glucose Monitoring Suppl (ACCU-CHEK GUIDE) w/Device KIT U UTD   . buPROPion (WELLBUTRIN  XL) 150 MG 24 hr tablet TAKE 1 TABLET(150 MG) BY MOUTH DAILY   . cetirizine (ZYRTEC) 10 MG tablet TAKE 1 TABLET BY MOUTH DAILY   . Cholecalciferol (VITAMIN D3) 5000 units TABS Take 5,000 Units by mouth daily.    . citalopram (CELEXA) 10 MG tablet TAKE 1 TABLET BY MOUTH  DAILY   . diclofenac sodium (VOLTAREN) 1 % GEL Apply 1 application topically 4 (four) times daily as needed (pain).    . famotidine (PEPCID) 40 MG tablet Take 40 mg by mouth daily.   . Fluticasone-Salmeterol (ADVAIR) 500-50 MCG/DOSE AEPB Inhale 1 puff into the lungs 2 (two) times daily.   . furosemide (LASIX) 20 MG tablet TAKE 1 TABLET(20 MG) BY MOUTH TWICE DAILY   . isosorbide mononitrate (IMDUR) 60 MG 24 hr tablet Take 60 mg by mouth daily.   Marland Kitchen JARDIANCE 25 MG TABS tablet Take 25 mg by mouth daily.   . Lancets Misc. (ACCU-CHEK FASTCLIX LANCET) KIT 1 Units by Does not apply route 2 (two) times daily.   Marland Kitchen LANTUS SOLOSTAR 100 UNIT/ML Solostar Pen INJECT 75 UNITS [place] D. (Patient taking differently: 90 Units. INJECT 75 UNITS [place] D.)   . losartan (COZAAR) 50 MG tablet Take 50 mg by mouth daily.   . magnesium gluconate (MAGONATE) 500 MG tablet Take 500 mg by mouth daily.   . meloxicam (MOBIC) 7.5 MG tablet TAKE 1 TABLET(7.5 MG) BY MOUTH DAILY   . metFORMIN (GLUCOPHAGE) 1000 MG tablet TAKE  1 TABLET BY MOUTH  TWICE DAILY   . metoprolol succinate (TOPROL-XL) 25 MG 24 hr tablet Take 25 mg by mouth daily.   . montelukast (SINGULAIR) 10 MG tablet TAKE 1 TABLET(10 MG) BY MOUTH DAILY   . NEEDLE, DISP, 18 G 18G X 1" MISC Use as directed   . nystatin cream (MYCOSTATIN) Apply 1 application topically 2 (two) times daily.   . pantoprazole (PROTONIX) 40 MG tablet Take 1 tablet (40 mg total) by mouth 2 (two) times daily before a meal.   . pregabalin (LYRICA) 150 MG capsule Take 1 capsule (150 mg total) by mouth 2 (two) times daily.   . primidone (MYSOLINE) 50 MG tablet TAKE 2 TABLETS(100 MG) BY MOUTH DAILY   . rosuvastatin (CRESTOR) 20 MG tablet  TAKE 1 TABLET BY MOUTH  DAILY   . Semaglutide, 1 MG/DOSE, (OZEMPIC, 1 MG/DOSE,) 2 MG/1.5ML SOPN Inject 1 mg into the skin once a week.   . sucralfate (CARAFATE) 1 g tablet TAKE 1 TABLET(1 GRAM) BY MOUTH THREE TIMES DAILY AS NEEDED   . SYRINGE-NEEDLE, DISP, 3 ML (LUER LOCK SAFETY SYRINGES) 21G X 1-1/2" 3 ML MISC Use as directed for testosterone administration   . testosterone cypionate (DEPOTESTOSTERONE CYPIONATE) 200 MG/ML injection INJECT 1 ML IN THE MUSCLE EVERY 14 DAYS    No facility-administered encounter medications on file as of 01/01/2020.     Objective:  BP Readings from Last 3 Encounters:  09/17/19 122/66  08/30/19 (!) 131/53  07/30/19 106/64    Goals Addressed              This Visit's Progress   .  RNCM-Pt-"I am taking my blood pressure at home" (pt-stated)        CARE PLAN ENTRY (see longtitudinal plan of care for additional care plan information)  Current Barriers:  . Chronic Disease Management support, education, and care coordination needs related to HTN and HLD  Clinical Goal(s) related to HTN and HLD:  Over the next 120 days, patient will:  . Work with the care management team to address educational, disease management, and care coordination needs  . Begin or continue self health monitoring activities as directed today Measure and record blood pressure 3/4 times per week and adhere to a heart healthy/ADA diet . Call provider office for new or worsened signs and symptoms Blood pressure findings outside established parameters, Oxygen saturation lower than established parameter, Chest pain, Shortness of breath, and New or worsened symptom related to HLD and chronic conditions . Call care management team with questions or concerns . Verbalize basic understanding of patient centered plan of care established today  Interventions related to HTN and HLD:  . Evaluation of current treatment plans and patient's adherence to plan as established by provider.  States  compliance with medications and plan of care. 01-01-2020: States his blood pressure is good but he could not give accurate numbers. Denies issues with compliance.  . Assessed patient understanding of disease states.  Review of blood pressure readings. The patient did not have any readings for the [facility] but states it is "good". Education on taking blood pressures regularly and recording, having list available at [employer]. 01-01-2020: States he is taking his blood pressures some. The patient denies any issues with blood pressures.  . Assessed patient's education and care coordination needs.  The patient needs consistent reminders to follow plan of care for health and wellness. 01-01-2020: The patient states on outreaches he wants to be healthy and maintain his health  and well being. The patient has to have frequent reinforcement to stay on track with lifestyle changes to help control his chronic conditions. Will continue to work with the patient to help the patient reach his health and wellness goals. Reminded the patient of importance of following recommendations of the provider.  . Provided disease specific education to patient.  Education and support on Heart heatlhy/ADA diet, taking bp and glucose readings consistently and writing them down. The patient denies any issues at this time. 11-08-2019: Will send new educational material by my chart and EMMI system for the patient on management of chronic conditions. Also will send information by mail to the patient on healthy eating. 01-01-2020: The patient reminded of being mindful of food choices, especially with the holiday season and the different variety of foods available. Will continue to work with the patient for education and making healthy choices.  Nash Dimmer with appropriate clinical care team members regarding patient needs.  Ongoing support from [facility] team pharmacist and LCSW.   Patient Self Care Activities related to HTN and HLD:  . Patient is  unable to independently self-manage chronic health conditions  Please see past updates related to this goal by clicking on the "Past Updates" button in the selected goal      .  RNCM:"My sugar was up this am because I ate grapes" (pt-stated)        Current Barriers:  Marland Kitchen Knowledge Deficits related to negative impact on health and well being in a patient with chronic disease processes and not following a prescribed Heart healthy/ADA diet . Film/video editor.  . Transportation barriers  Nurse Case Manager Clinical Goal(s):  Marland Kitchen Over the next 120 days, patient will verbalize understanding of plan for following a Heart healthy/ADA diet . Over the next 120 days, patient will work with [place], pcp and CCM team to address needs related to dietary restrictions and following a heart healthy/ADA diet . Over the next 120 days, patient will demonstrate a decrease in hyperglycemic exacerbations as evidenced by decreased blood sugar readings and decrease hemoglobin A1C levels . Over the next 120 days, patient will demonstrate improved health management independence as evidenced bymaking healthy food choice, watching hidden salts and sugars, continuing to monitor blood sugars . Over the next 120 days, patient will work with CM team pharmacist to monitor medications. The patient continues to work with the pharmacist to meet needs . Over the next 90 days, patient will work with CM clinical social worker to assist with resources as needed- ongoing support and assistance from the Education officer, museum . Over the next 90 days, patient will work with care guides to get adequate transportation- completed   Interventions:  . Evaluation of current treatment plan related to DM and HTN dietary restrictions and patient's adherence to plan as established by provider. 11-08-2019: The patient saw endocrinologist on 09-16-2019 and his hemoglobin is increasing.  It went from 9.3% in February to 10.2% on E-16-2021.  The patient states he  has been eating a lot of bread. Ask the patient for blood sugar readings and he is having numbers in 300's.  The patient is not consistently following recommendations by the provider.  01-01-2020: The patient states that his blood sugars are better now. He states he is checking BID and this am was 180.  The patient has a follow up appointment with endocrinology on 01-16-2020. . Advised patient to monitor his dietary intake.  The patient says his blood sugars vary depending on what he  eats. He ate BBQ sandwiches last night, his blood sugar this am was 200.  Education and reiterated the importance of watching salts and sugars in meal planning. 11-08-2019: the patient states that his blood sugars have been "high". When evaluation of high the patient states "300's".  Endorses taking blood sugars daily but did not have list available.  Encouraged the patient to write down readings to have for CCM calls and MD visits. The patient states his weakness is bread. The patient realizes his diabetes is not in good control. Discussed at length dietary restrictions and the need to follow recommendations by provider. The patient had been drinking a lot of drinks but is drinking diet drinks now. 01-01-2020: The patient states that his blood sugar this am was 180.  He states that sometimes it is 200.  Review of practices and the patient states he is not eating a lot of fruit like he was. Discussed the importance of good blood sugar control.  . Provided education to patient re: to following a Heart Healthy/ADA diet.  The patient is aware of blood sugars being elevated and blood pressure elevation with use of high sodium foods. 01-01-2020: Review of heart healthy/ADA diet. The patient verbalized his blood sugars change dependent on what he eats. Th patient is still making poor dietary choices. Education on breads, pastas, fruits, and corn being full of carbohydrates. The patient states he knows he has to do better. Education on  monitoring dietary intake better and making positive changes to help with better control of diabetes. Education on how at least 15 minutes of moderate exercise can help lower blood sugars. The patient does like to walk. Encouraged walking daily.  Education on the effects uncontrolled DM of body systems, eyes, kidneys, etc.  The patient verbalized understanding. Will send information by mail on healthy food choices and getting blood sugars to goal. Education provided that fasting blood sugar should be <130 and post prandial of <180.  The patient verbalized understanding. Praised for cutting out sugary beverages.  Marland Kitchen Collaborated with pcp, CCM team regarding management of ADA.  Ongoing support and education needed.  . Discussed plans with patient for ongoing care management follow up and provided patient with direct contact information for care management team . Advised patient, providing education and rationale, to monitor blood pressure daily and record, calling pcp for findings outside established parameters.  . Advised patient, providing education and rationale, to check cbg bid and record, calling pcp for findings outside established parameters.   . Review of upcoming appointments. The patient states he will see the endocrinologist again next month. Has an appointment on 02-03-2020 at 1:20 pm to see Dr. Laural Benes. The patient states he has an appointment to see the specialist on 01-16-2020 and will have a new hemoglobin A1C.   Patient Self Care Activities:  . Patient verbalizes understanding of plan to monitor health and  well being by following prescribed diet for chronic health conditions . Attends all scheduled provider appointments . Calls provider office for new concerns or questions . Unable to independently manage chronic medical conditions . Does not adhere to provider recommendations re: heart healthy/ADA  Please see past updates related to this goal by clicking on the "Past Updates" button in  the selected goal           Plan:   Telephone follow up appointment with care management team member scheduled for: 02-25-2020 at 0945 am   Alto Denver RN, MSN, CCM [facility] Coordinator [facility]  [facility] Mobile: [phone]

## 2020-01-01 NOTE — Patient Instructions (Signed)
Visit Information  Goals Addressed              This Visit's Progress     RNCM-Pt-"I am taking my blood pressure at home" (pt-stated)        CARE PLAN ENTRY (see longtitudinal plan of care for additional care plan information)  Current Barriers:   Chronic Disease Management support, education, and care coordination needs related to HTN and HLD  Clinical Goal(s) related to HTN and HLD:  Over the next 120 days, patient will:   Work with the care management team to address educational, disease management, and care coordination needs   Begin or continue self health monitoring activities as directed today Measure and record blood pressure 3/4 times per week and adhere to a heart healthy/ADA diet  Call provider office for new or worsened signs and symptoms Blood pressure findings outside established parameters, Oxygen saturation lower than established parameter, Chest pain, Shortness of breath, and New or worsened symptom related to HLD and chronic conditions  Call care management team with questions or concerns  Verbalize basic understanding of patient centered plan of care established today  Interventions related to HTN and HLD:   Evaluation of current treatment plans and patient's adherence to plan as established by provider.  States compliance with medications and plan of care. 01-01-2020: States his blood pressure is good but he could not give accurate numbers. Denies issues with compliance.   Assessed patient understanding of disease states.  Review of blood pressure readings. The patient did not have any readings for the [facility] but states it is "good". Education on taking blood pressures regularly and recording, having list available at [employer]. 01-01-2020: States he is taking his blood pressures some. The patient denies any issues with blood pressures.   Assessed patient's education and care coordination needs.  The patient needs consistent reminders to follow plan of care for  health and wellness. 01-01-2020: The patient states on outreaches he wants to be healthy and maintain his health and well being. The patient has to have frequent reinforcement to stay on track with lifestyle changes to help control his chronic conditions. Will continue to work with the patient to help the patient reach his health and wellness goals. Reminded the patient of importance of following recommendations of the provider.   Provided disease specific education to patient.  Education and support on Heart heatlhy/ADA diet, taking bp and glucose readings consistently and writing them down. The patient denies any issues at this time. 11-08-2019: Will send new educational material by my chart and EMMI system for the patient on management of chronic conditions. Also will send information by mail to the patient on healthy eating. 01-01-2020: The patient reminded of being mindful of food choices, especially with the holiday season and the different variety of foods available. Will continue to work with the patient for education and making healthy choices.   Collaborated with appropriate clinical care team members regarding patient needs.  Ongoing support from [facility] team pharmacist and LCSW.   Patient Self Care Activities related to HTN and HLD:   Patient is unable to independently self-manage chronic health conditions  Please see past updates related to this goal by clicking on the "Past Updates" button in the selected goal        RNCM:"My sugar was up this am because I ate grapes" (pt-stated)        Current Barriers:   Knowledge Deficits related to negative impact on health and well being in a  patient with chronic disease processes and not following a prescribed Heart healthy/ADA diet  Financial Constraints.   Transportation barriers  Nurse Case Manager Clinical Goal(s):   Over the next 120 days, patient will verbalize understanding of plan for following a Heart healthy/ADA diet  Over the next  120 days, patient will work with RNCM, pcp and CCM team to address needs related to dietary restrictions and following a heart healthy/ADA diet  Over the next 120 days, patient will demonstrate a decrease in hyperglycemic exacerbations as evidenced by decreased blood sugar readings and decrease hemoglobin A1C levels  Over the next 120 days, patient will demonstrate improved health management independence as evidenced bymaking healthy food choice, watching hidden salts and sugars, continuing to monitor blood sugars  Over the next 120 days, patient will work with CM team pharmacist to monitor medications. The patient continues to work with the pharmacist to meet needs  Over the next 90 days, patient will work with CM clinical social worker to assist with resources as needed- ongoing support and assistance from the social worker  Over the next 90 days, patient will work with care guides to get adequate transportation- completed   Interventions:   Evaluation of current treatment plan related to DM and HTN dietary restrictions and patient's adherence to plan as established by provider. 11-08-2019: The patient saw endocrinologist on 09-16-2019 and his hemoglobin is increasing.  It went from 9.3% in February to 10.2% on E-16-2021.  The patient states he has been eating a lot of bread. Ask the patient for blood sugar readings and he is having numbers in 300's.  The patient is not consistently following recommendations by the provider.  01-01-2020: The patient states that his blood sugars are better now. He states he is checking BID and this am was 180.  The patient has a follow up appointment with endocrinology on 01-16-2020.  Advised patient to monitor his dietary intake.  The patient says his blood sugars vary depending on what he eats. He ate BBQ sandwiches last night, his blood sugar this am was 200.  Education and reiterated the importance of watching salts and sugars in meal planning. 11-08-2019: the  patient states that his blood sugars have been "high". When evaluation of high the patient states "300's".  Endorses taking blood sugars daily but did not have list available.  Encouraged the patient to write down readings to have for CCM calls and MD visits. The patient states his weakness is bread. The patient realizes his diabetes is not in good control. Discussed at length dietary restrictions and the need to follow recommendations by provider. The patient had been drinking a lot of drinks but is drinking diet drinks now. 01-01-2020: The patient states that his blood sugar this am was 180.  He states that sometimes it is 200.  Review of practices and the patient states he is not eating a lot of fruit like he was. Discussed the importance of good blood sugar control.   Provided education to patient re: to following a Heart Healthy/ADA diet.  The patient is aware of blood sugars being elevated and blood pressure elevation with use of high sodium foods. 01-01-2020: Review of heart healthy/ADA diet. The patient verbalized his blood sugars change dependent on what he eats. Th patient is still making poor dietary choices. Education on breads, pastas, fruits, and corn being full of carbohydrates. The patient states he knows he has to do better. Education on monitoring dietary intake better and making positive changes  to help with better control of diabetes. Education on how at least 15 minutes of moderate exercise can help lower blood sugars. The patient does like to walk. Encouraged walking daily.  Education on the effects uncontrolled DM of body systems, eyes, kidneys, etc.  The patient verbalized understanding. Will send information by mail on healthy food choices and getting blood sugars to goal. Education provided that fasting blood sugar should be <130 and post prandial of <180.  The patient verbalized understanding. Praised for cutting out sugary beverages.   Collaborated with pcp, CCM team regarding  management of ADA.  Ongoing support and education needed.   Discussed plans with patient for ongoing care management follow up and provided patient with direct contact information for care management team  Advised patient, providing education and rationale, to monitor blood pressure daily and record, calling pcp for findings outside established parameters.   Advised patient, providing education and rationale, to check cbg bid and record, calling pcp for findings outside established parameters.    Review of upcoming appointments. The patient states he will see the endocrinologist again next month. Has an appointment on 02-03-2020 at 1:20 pm to see Dr. Laural Benes. The patient states he has an appointment to see the specialist on 01-16-2020 and will have a new hemoglobin A1C.   Patient Self Care Activities:   Patient verbalizes understanding of plan to monitor health and  well being by following prescribed diet for chronic health conditions  Attends all scheduled provider appointments  Calls provider office for new concerns or questions  Unable to independently manage chronic medical conditions  Does not adhere to provider recommendations re: heart healthy/ADA  Please see past updates related to this goal by clicking on the "Past Updates" button in the selected goal         The patient verbalized understanding of instructions, educational materials, and care plan provided today and declined offer to receive copy of patient instructions, educational materials, and care plan.   Telephone follow up appointment with care management team member scheduled for: 02-25-2020 at 0945 am  Alto Denver RN, MSN, CCM Community Care Coordinator [place]   [facility] Mobile: [phone]

## 2020-01-03 ENCOUNTER — Ambulatory Visit: Payer: Self-pay | Admitting: *Deleted

## 2020-01-03 NOTE — Telephone Encounter (Signed)
Patient's daughter is calling to report patient has rash on side of face. She noticed it was there on Tuesday- but patient states it has been there longer. She is not with the patient- and she is answering questions. She stats he does not complain and she does not know if he is itching or having pain.There had been no OTC treatment.Advsie no appointment open in disposition timeline- advised UC for evaluation. She is going to take him.  Reason for Disposition . Localized rash present > 7 days  Answer Assessment - Initial Assessment Questions 1. APPEARANCE of RASH: "Describe the rash."      Red dots- on face- smaller than erasers 2. LOCATION: "Where is the rash located?"      L side of face 3. NUMBER: "How many spots are there?"      Almost covering face- cheek area 4. SIZE: "How big are the spots?" (Inches, centimeters or compare to size of a coin)      Smaller than erasers 5. ONSET: "When did the rash start?"      At least since Tuesday patient reports longer 6. ITCHING: "Does the rash itch?" If Yes, ask: "How bad is the itch?"  (Scale 1-10; or mild, moderate, severe)     Not known 7. PAIN: "Does the rash hurt?" If Yes, ask: "How bad is the pain?"  (Scale 1-10; or mild, moderate, severe)     Not known 8. OTHER SYMPTOMS: "Do you have any other symptoms?" (e.g., fever)     no 9. PREGNANCY: "Is there any chance you are pregnant?" "When was your last menstrual period?"     n/a  Protocols used: RASH OR REDNESS - LOCALIZED-A-AH

## 2020-01-03 NOTE — Telephone Encounter (Signed)
Noted, we can follow-up if needed after UC visit.

## 2020-01-06 ENCOUNTER — Other Ambulatory Visit: Payer: Self-pay | Admitting: Family Medicine

## 2020-01-11 ENCOUNTER — Encounter: Payer: Self-pay | Admitting: Emergency Medicine

## 2020-01-11 ENCOUNTER — Inpatient Hospital Stay
Admission: EM | Admit: 2020-01-11 | Discharge: 2020-01-14 | DRG: 177 | Disposition: A | Discharge status: Home / Self Care | Payer: 59 | Attending: Hospitalist | Admitting: Hospitalist

## 2020-01-11 ENCOUNTER — Emergency Department: Payer: 59

## 2020-01-11 ENCOUNTER — Other Ambulatory Visit: Payer: Self-pay

## 2020-01-11 DIAGNOSIS — K219 Gastro-esophageal reflux disease without esophagitis: Secondary | ICD-10-CM | POA: Diagnosis present

## 2020-01-11 DIAGNOSIS — E1165 Type 2 diabetes mellitus with hyperglycemia: Secondary | ICD-10-CM | POA: Diagnosis present

## 2020-01-11 DIAGNOSIS — U071 COVID-19: Secondary | ICD-10-CM | POA: Diagnosis present

## 2020-01-11 DIAGNOSIS — I2510 Atherosclerotic heart disease of native coronary artery without angina pectoris: Secondary | ICD-10-CM | POA: Diagnosis present

## 2020-01-11 DIAGNOSIS — E1159 Type 2 diabetes mellitus with other circulatory complications: Secondary | ICD-10-CM | POA: Diagnosis present

## 2020-01-11 DIAGNOSIS — E785 Hyperlipidemia, unspecified: Secondary | ICD-10-CM | POA: Diagnosis present

## 2020-01-11 DIAGNOSIS — I10 Essential (primary) hypertension: Secondary | ICD-10-CM | POA: Diagnosis present

## 2020-01-11 DIAGNOSIS — Z8249 Family history of ischemic heart disease and other diseases of the circulatory system: Secondary | ICD-10-CM

## 2020-01-11 DIAGNOSIS — Z7902 Long term (current) use of antithrombotics/antiplatelets: Secondary | ICD-10-CM

## 2020-01-11 DIAGNOSIS — Z7984 Long term (current) use of oral hypoglycemic drugs: Secondary | ICD-10-CM | POA: Diagnosis not present

## 2020-01-11 DIAGNOSIS — I152 Hypertension secondary to endocrine disorders: Secondary | ICD-10-CM | POA: Diagnosis present

## 2020-01-11 DIAGNOSIS — M5442 Lumbago with sciatica, left side: Secondary | ICD-10-CM | POA: Diagnosis present

## 2020-01-11 DIAGNOSIS — J1282 Pneumonia due to coronavirus disease 2019: Secondary | ICD-10-CM | POA: Diagnosis present

## 2020-01-11 DIAGNOSIS — R778 Other specified abnormalities of plasma proteins: Secondary | ICD-10-CM | POA: Diagnosis present

## 2020-01-11 DIAGNOSIS — Z87891 Personal history of nicotine dependence: Secondary | ICD-10-CM

## 2020-01-11 DIAGNOSIS — J9601 Acute respiratory failure with hypoxia: Secondary | ICD-10-CM | POA: Diagnosis present

## 2020-01-11 DIAGNOSIS — Z833 Family history of diabetes mellitus: Secondary | ICD-10-CM | POA: Diagnosis not present

## 2020-01-11 DIAGNOSIS — Z951 Presence of aortocoronary bypass graft: Secondary | ICD-10-CM | POA: Diagnosis not present

## 2020-01-11 DIAGNOSIS — Z79899 Other long term (current) drug therapy: Secondary | ICD-10-CM

## 2020-01-11 DIAGNOSIS — G2581 Restless legs syndrome: Secondary | ICD-10-CM | POA: Diagnosis present

## 2020-01-11 DIAGNOSIS — Z7951 Long term (current) use of inhaled steroids: Secondary | ICD-10-CM

## 2020-01-11 DIAGNOSIS — Z794 Long term (current) use of insulin: Secondary | ICD-10-CM

## 2020-01-11 DIAGNOSIS — R251 Tremor, unspecified: Secondary | ICD-10-CM | POA: Diagnosis present

## 2020-01-11 DIAGNOSIS — F329 Major depressive disorder, single episode, unspecified: Secondary | ICD-10-CM | POA: Diagnosis present

## 2020-01-11 DIAGNOSIS — J45909 Unspecified asthma, uncomplicated: Secondary | ICD-10-CM | POA: Diagnosis present

## 2020-01-11 DIAGNOSIS — E291 Testicular hypofunction: Secondary | ICD-10-CM | POA: Diagnosis present

## 2020-01-11 DIAGNOSIS — Z20822 Contact with and (suspected) exposure to covid-19: Secondary | ICD-10-CM | POA: Diagnosis present

## 2020-01-11 DIAGNOSIS — E1169 Type 2 diabetes mellitus with other specified complication: Secondary | ICD-10-CM | POA: Diagnosis present

## 2020-01-11 DIAGNOSIS — Z7982 Long term (current) use of aspirin: Secondary | ICD-10-CM

## 2020-01-11 DIAGNOSIS — M5441 Lumbago with sciatica, right side: Secondary | ICD-10-CM | POA: Diagnosis present

## 2020-01-11 DIAGNOSIS — R7989 Other specified abnormal findings of blood chemistry: Secondary | ICD-10-CM

## 2020-01-11 DIAGNOSIS — Z809 Family history of malignant neoplasm, unspecified: Secondary | ICD-10-CM

## 2020-01-11 DIAGNOSIS — R519 Headache, unspecified: Secondary | ICD-10-CM | POA: Diagnosis present

## 2020-01-11 LAB — CBC
HCT: 52.8 % — ABNORMAL HIGH (ref 39.0–52.0)
HCT: 49.6 % (ref 39.0–52.0)
Hemoglobin: 17.3 g/dL — ABNORMAL HIGH (ref 13.0–17.0)
Hemoglobin: 15.9 g/dL (ref 13.0–17.0)
MCH: 30.9 pg (ref 26.0–34.0)
MCH: 30.5 pg (ref 26.0–34.0)
MCHC: 32.8 g/dL (ref 30.0–36.0)
MCHC: 32.1 g/dL (ref 30.0–36.0)
MCV: 94.3 fL (ref 80.0–100.0)
MCV: 95.2 fL (ref 80.0–100.0)
Platelets: 173 10*3/uL (ref 150–400)
Platelets: 178 10*3/uL (ref 150–400)
RBC: 5.6 MIL/uL (ref 4.22–5.81)
RBC: 5.21 MIL/uL (ref 4.22–5.81)
RDW: 16.9 % — ABNORMAL HIGH (ref 11.5–15.5)
RDW: 16.6 % — ABNORMAL HIGH (ref 11.5–15.5)
WBC: 6.1 10*3/uL (ref 4.0–10.5)
WBC: 5.6 10*3/uL (ref 4.0–10.5)
nRBC: 0 % (ref 0.0–0.2)
nRBC: 0 % (ref 0.0–0.2)

## 2020-01-11 LAB — URINALYSIS, COMPLETE (UACMP) WITH MICROSCOPIC
Bacteria, UA: NONE SEEN
Bilirubin Urine: NEGATIVE
Glucose, UA: >=500 mg/dL — AB
Hgb urine dipstick: NEGATIVE
Ketones, ur: 80 mg/dL — AB
Leukocytes,Ua: NEGATIVE
Nitrite: NEGATIVE
Protein, ur: 100 mg/dL — AB
Specific Gravity, Urine: >1.046 — ABNORMAL HIGH (ref 1.005–1.030)
Squamous Epithelial / HPF: NONE SEEN (ref 0–5)
pH: 6 (ref 5.0–8.0)

## 2020-01-11 LAB — MAGNESIUM: Magnesium: 2.2 mg/dL (ref 1.7–2.4)

## 2020-01-11 LAB — COMPREHENSIVE METABOLIC PANEL
ALT: 23 U/L (ref 0–44)
AST: 29 U/L (ref 15–41)
Albumin: 3.4 g/dL — ABNORMAL LOW (ref 3.5–5.0)
Alkaline Phosphatase: 73 U/L (ref 38–126)
Anion gap: 14 (ref 5–15)
BUN: 15 mg/dL (ref 8–23)
CO2: 23 mmol/L (ref 22–32)
Calcium: 8.8 mg/dL — ABNORMAL LOW (ref 8.9–10.3)
Chloride: 103 mmol/L (ref 98–111)
Creatinine, Ser: 1.02 mg/dL (ref 0.61–1.24)
GFR, Estimated: >60 mL/min (ref >60)
Glucose, Bld: 146 mg/dL — ABNORMAL HIGH (ref 70–99)
Potassium: 4.4 mmol/L (ref 3.5–5.1)
Sodium: 140 mmol/L (ref 135–145)
Total Bilirubin: 1.3 mg/dL — ABNORMAL HIGH (ref 0.3–1.2)
Total Protein: 7.7 g/dL (ref 6.5–8.1)

## 2020-01-11 LAB — CBG MONITORING, ED
Glucose-Capillary: 138 mg/dL — ABNORMAL HIGH (ref 70–99)
Glucose-Capillary: 113 mg/dL — ABNORMAL HIGH (ref 70–99)
Glucose-Capillary: 114 mg/dL — ABNORMAL HIGH (ref 70–99)
Glucose-Capillary: 139 mg/dL — ABNORMAL HIGH (ref 70–99)

## 2020-01-11 LAB — LIPASE, BLOOD: Lipase: 18 U/L (ref 11–51)

## 2020-01-11 LAB — FIBRIN DERIVATIVES D-DIMER (ARMC ONLY): Fibrin derivatives D-dimer (ARMC): 893.99 ng/mL (FEU) — ABNORMAL HIGH (ref 0.00–499.00)

## 2020-01-11 LAB — RESP PANEL BY RT-PCR (FLU A&B, COVID) ARPGX2
Influenza A by PCR: NEGATIVE
Influenza B by PCR: NEGATIVE
SARS Coronavirus 2 by RT PCR: POSITIVE — AB

## 2020-01-11 LAB — TROPONIN I (HIGH SENSITIVITY)
Troponin I (High Sensitivity): 21 ng/L — ABNORMAL HIGH (ref <18)
Troponin I (High Sensitivity): 10 ng/L (ref <18)

## 2020-01-11 LAB — PROCALCITONIN: Procalcitonin: 0.17 ng/mL

## 2020-01-11 LAB — CREATININE, SERUM
Creatinine, Ser: 1 mg/dL (ref 0.61–1.24)
GFR, Estimated: >60 mL/min (ref >60)

## 2020-01-11 LAB — BRAIN NATRIURETIC PEPTIDE: B Natriuretic Peptide: 44.1 pg/mL (ref 0.0–100.0)

## 2020-01-11 MED ORDER — CITALOPRAM HYDROBROMIDE 20 MG PO TABS
10.0000 mg | ORAL_TABLET | Freq: Every day | ORAL | Status: DC
  Administered 2020-01-11 – 2020-01-14 (×4): 10 mg via ORAL
  Filled 2020-01-11 (×4): qty 1

## 2020-01-11 MED ORDER — PANTOPRAZOLE SODIUM 40 MG PO TBEC
40.0000 mg | DELAYED_RELEASE_TABLET | Freq: Two times a day (BID) | ORAL | Status: DC
  Administered 2020-01-12 – 2020-01-14 (×5): 40 mg via ORAL
  Filled 2020-01-11 (×5): qty 1

## 2020-01-11 MED ORDER — ROSUVASTATIN CALCIUM 20 MG PO TABS
20.0000 mg | ORAL_TABLET | Freq: Every day | ORAL | Status: DC
  Administered 2020-01-12 – 2020-01-14 (×3): 20 mg via ORAL
  Filled 2020-01-11 (×4): qty 1

## 2020-01-11 MED ORDER — ALBUTEROL SULFATE (2.5 MG/3ML) 0.083% IN NEBU: 2.5000 mg | INHALATION_SOLUTION | Freq: Four times a day (QID) | RESPIRATORY_TRACT | Status: DC | PRN

## 2020-01-11 MED ORDER — GUAIFENESIN-DM 100-10 MG/5ML PO SYRP
10.0000 mL | ORAL_SOLUTION | ORAL | Status: DC | PRN
  Administered 2020-01-12: 22:00:00 10 mL via ORAL
  Filled 2020-01-11: qty 10

## 2020-01-11 MED ORDER — ISOSORBIDE MONONITRATE ER 30 MG PO TB24
60.0000 mg | ORAL_TABLET | Freq: Every day | ORAL | Status: DC
  Administered 2020-01-12 – 2020-01-14 (×3): 60 mg via ORAL
  Filled 2020-01-11: qty 1
  Filled 2020-01-11 (×2): qty 2

## 2020-01-11 MED ORDER — HYDROCOD POLST-CPM POLST ER 10-8 MG/5ML PO SUER
5.0000 mL | Freq: Two times a day (BID) | ORAL | Status: DC | PRN
  Administered 2020-01-12: 5 mL via ORAL
  Filled 2020-01-11: qty 5

## 2020-01-11 MED ORDER — DOCUSATE SODIUM 100 MG PO CAPS
100.0000 mg | ORAL_CAPSULE | Freq: Two times a day (BID) | ORAL | Status: DC
  Administered 2020-01-11 – 2020-01-14 (×5): 100 mg via ORAL
  Filled 2020-01-11 (×5): qty 1

## 2020-01-11 MED ORDER — ONDANSETRON HCL 4 MG PO TABS: 4.0000 mg | ORAL_TABLET | Freq: Four times a day (QID) | ORAL | Status: DC | PRN

## 2020-01-11 MED ORDER — ONDANSETRON HCL 4 MG/2ML IJ SOLN: 4.0000 mg | Freq: Four times a day (QID) | INTRAMUSCULAR | Status: DC | PRN

## 2020-01-11 MED ORDER — METOPROLOL SUCCINATE ER 25 MG PO TB24
25.0000 mg | ORAL_TABLET | Freq: Every day | ORAL | Status: DC
  Administered 2020-01-11 – 2020-01-14 (×4): 25 mg via ORAL
  Filled 2020-01-11 (×4): qty 1

## 2020-01-11 MED ORDER — FUROSEMIDE 20 MG PO TABS
20.0000 mg | ORAL_TABLET | Freq: Two times a day (BID) | ORAL | Status: DC
  Administered 2020-01-12 – 2020-01-14 (×5): 20 mg via ORAL
  Filled 2020-01-11 (×6): qty 1

## 2020-01-11 MED ORDER — SODIUM CHLORIDE 0.9 % IV SOLN: 100.0000 mg | Freq: Every day | INTRAVENOUS | Status: DC

## 2020-01-11 MED ORDER — ASPIRIN EC 81 MG PO TBEC
81.0000 mg | DELAYED_RELEASE_TABLET | Freq: Every day | ORAL | Status: DC
  Administered 2020-01-11 – 2020-01-14 (×4): 81 mg via ORAL
  Filled 2020-01-11 (×4): qty 1

## 2020-01-11 MED ORDER — SODIUM CHLORIDE 0.9 % IV SOLN
100.0000 mg | Freq: Every day | INTRAVENOUS | Status: DC
  Administered 2020-01-12 – 2020-01-14 (×3): 100 mg via INTRAVENOUS
  Filled 2020-01-11 (×3): qty 20

## 2020-01-11 MED ORDER — MONTELUKAST SODIUM 10 MG PO TABS
10.0000 mg | ORAL_TABLET | Freq: Every day | ORAL | Status: DC
  Administered 2020-01-12 – 2020-01-13 (×2): 10 mg via ORAL
  Filled 2020-01-11 (×5): qty 1

## 2020-01-11 MED ORDER — SODIUM CHLORIDE 0.9 % IV SOLN: 200.0000 mg | Freq: Once | INTRAVENOUS | Status: DC

## 2020-01-11 MED ORDER — IOHEXOL 350 MG/ML SOLN
100.0000 mL | Freq: Once | INTRAVENOUS | Status: AC | PRN
  Administered 2020-01-11: 18:00:00 100 mL via INTRAVENOUS

## 2020-01-11 MED ORDER — ACETAMINOPHEN 325 MG PO TABS
650.0000 mg | ORAL_TABLET | Freq: Four times a day (QID) | ORAL | Status: DC | PRN
  Administered 2020-01-11: 650 mg via ORAL
  Filled 2020-01-11: qty 2

## 2020-01-11 MED ORDER — INSULIN ASPART 100 UNIT/ML ~~LOC~~ SOLN
0.0000 [IU] | SUBCUTANEOUS | Status: DC
  Administered 2020-01-11 – 2020-01-13 (×5): 2 [IU] via SUBCUTANEOUS
  Administered 2020-01-13: 12:00:00 5 [IU] via SUBCUTANEOUS
  Administered 2020-01-13: 17:00:00 3 [IU] via SUBCUTANEOUS
  Filled 2020-01-11 (×7): qty 1

## 2020-01-11 MED ORDER — LOSARTAN POTASSIUM 50 MG PO TABS
50.0000 mg | ORAL_TABLET | Freq: Every day | ORAL | Status: DC
  Administered 2020-01-11 – 2020-01-14 (×3): 50 mg via ORAL
  Filled 2020-01-11 (×4): qty 1

## 2020-01-11 MED ORDER — BUPROPION HCL ER (XL) 150 MG PO TB24
150.0000 mg | ORAL_TABLET | Freq: Every day | ORAL | Status: DC
  Administered 2020-01-11 – 2020-01-14 (×4): 150 mg via ORAL
  Filled 2020-01-11 (×4): qty 1

## 2020-01-11 MED ORDER — SUCRALFATE 1 G PO TABS
1.0000 g | ORAL_TABLET | Freq: Two times a day (BID) | ORAL | Status: DC
  Administered 2020-01-11 – 2020-01-14 (×6): 1 g via ORAL
  Filled 2020-01-11 (×6): qty 1

## 2020-01-11 MED ORDER — DEXAMETHASONE SODIUM PHOSPHATE 10 MG/ML IJ SOLN
6.0000 mg | INTRAMUSCULAR | Status: DC
  Administered 2020-01-11 – 2020-01-13 (×2): 6 mg via INTRAVENOUS
  Filled 2020-01-11 (×2): qty 1

## 2020-01-11 MED ORDER — DEXAMETHASONE SODIUM PHOSPHATE 10 MG/ML IJ SOLN
6.0000 mg | INTRAMUSCULAR | Status: DC
Start: 2020-01-12 — End: 2020-01-11

## 2020-01-11 MED ORDER — DEXAMETHASONE SODIUM PHOSPHATE 10 MG/ML IJ SOLN
6.0000 mg | Freq: Once | INTRAMUSCULAR | Status: AC
  Administered 2020-01-11: 20:00:00 6 mg via INTRAVENOUS
  Filled 2020-01-11: qty 1

## 2020-01-11 MED ORDER — LACTATED RINGERS IV BOLUS
1000.0000 mL | Freq: Once | INTRAVENOUS | Status: AC
  Administered 2020-01-11: 16:00:00 1000 mL via INTRAVENOUS

## 2020-01-11 MED ORDER — ENOXAPARIN SODIUM 40 MG/0.4ML ~~LOC~~ SOLN
40.0000 mg | SUBCUTANEOUS | Status: DC
  Administered 2020-01-11: 23:00:00 40 mg via SUBCUTANEOUS
  Filled 2020-01-11: qty 0.4

## 2020-01-11 MED ORDER — SODIUM CHLORIDE 0.9 % IV SOLN
200.0000 mg | Freq: Once | INTRAVENOUS | Status: AC
  Administered 2020-01-11: 21:00:00 200 mg via INTRAVENOUS
  Filled 2020-01-11: qty 40

## 2020-01-11 MED ORDER — INSULIN GLARGINE 100 UNIT/ML ~~LOC~~ SOLN
50.0000 [IU] | Freq: Every day | SUBCUTANEOUS | Status: DC
  Administered 2020-01-11 – 2020-01-13 (×2): 50 [IU] via SUBCUTANEOUS
  Filled 2020-01-11 (×4): qty 0.5

## 2020-01-11 NOTE — H&P (Addendum)
History and Physical    DAMEK ENDE NTZ:[id number] DOB: 1955-06-20 DOA: 01/11/2020  PCP: Volney American, PA-C  Patient coming from:  Home  I have personally briefly reviewed patient's old medical records in [place]  Chief Complaint: cough, sob, Nausea and vomiting  HPI: Corey [name] is a 64 y.o. male with medical history significant of hypertension, hyperlipidemia, GERD, diabetes, asthma, CAD s/p bypass surgery who presents to the emergency department with complaints of generalized body ache,  Headache, vomiting, diarrhea, decreased appetite, cough and progressive shortness of breath and chest tightness for 3-4 days.  Patient denies any fever, urinary symptoms, recent fall.  Patient report history of asthma and uses inhalers as needed.  He is not vaccinated against Covid.  His wife had similar symptoms and got positive with Covid before he develop symptoms afterwards.  ED Course: He was hypoxic on arrival,  his O2 saturation was 88 on room air,  He was placed on 3 L supplemental oxygen saturating 97%. He was tachypneic with RR 18,  Tachycardic 104  and afebrile.  Labs include sodium 140, potassium 4.4, chloride 103, bicarb 23, glucose 146, BUN 15, creatinine 1.02, calcium 8.8, anion gap 14, magnesium 2.2, albumin 3.4, lipase 18, AST 29, ALT 33, total bilirubin 1.3, BNP 44.1, troponin XX 1, procalcitonin 0.17, WBC 6.1, hemoglobin 17.3, hematocrit 52.8, platelet 173, fibrin D-dimers 8 9 3.  Influenza negative, Covid positive, CT chest: Multifocal pneumonia with findings suggestive of COVID-19 infection.  Review of Systems: As per HPI otherwise 10 point review of systems negative.  Review of Systems  Constitutional: Positive for chills and malaise/fatigue.  HENT: Positive for congestion.   Eyes: Negative.   Respiratory: Positive for cough, shortness of breath and wheezing.   Cardiovascular: Positive for claudication.  Gastrointestinal: Positive for diarrhea, nausea  and vomiting.  Genitourinary: Negative.   Musculoskeletal: Positive for back pain and myalgias.  Skin: Negative.   Neurological: Positive for headaches.  Endo/Heme/Allergies: Negative.   Psychiatric/Behavioral: Negative.      Past Medical History:  Diagnosis Date  . Asthma   . Diabetes ([place])   . GERD (gastroesophageal reflux disease)   . Hyperlipidemia   . Hypertension   . Legg-Perthes disease     Past Surgical History:  Procedure Laterality Date  . CARDIAC CATHETERIZATION    . COLONOSCOPY WITH PROPOFOL N/A 04/16/2019   Procedure: COLONOSCOPY WITH PROPOFOL;  Surgeon: Jonathon Bellows, MD;  Location: [facility] ENDOSCOPY;  Service: Gastroenterology;  Laterality: N/A;  . COLONOSCOPY WITH PROPOFOL N/A 06/14/2019   Procedure: COLONOSCOPY WITH PROPOFOL;  Surgeon: Jonathon Bellows, MD;  Location[facility] ENDOSCOPY;  Service: Gastroenterology;  Laterality: N/A;  . COLONOSCOPY WITH PROPOFOL N/A 07/30/2019   Procedure: COLONOSCOPY WITH PROPOFOL;  Surgeon: Jonathon Bellows, MD;  Location: [facility] ENDOSCOPY;  Service: Gastroenterology;  Laterality: N/A;  . ESOPHAGOGASTRODUODENOSCOPY (EGD) WITH PROPOFOL N/A 04/16/2019   Procedure: ESOPHAGOGASTRODUODENOSCOPY (EGD) WITH PROPOFOL;  Surgeon: Jonathon Bellows, MD;  Location: [facility] ENDOSCOPY;  Service: Gastroenterology;  Laterality: N/A;  . Foot Surgery    . HIP SURGERY    . RIGHT/LEFT HEART CATH AND CORONARY ANGIOGRAPHY N/A 07/25/2018   Procedure: RIGHT/LEFT HEART CATH AND CORONARY ANGIOGRAPHY;  Surgeon: Yolonda Kida, MD;  Location: [place] CV LAB;  Service: Cardiovascular;  Laterality: N/A;     reports that he has quit smoking. His smoking use included cigarettes. He smoked 0.50 packs per day. His smokeless tobacco use includes chew. He reports previous alcohol use. He reports that he does not  use drugs.  No Known Allergies  Family History  Problem Relation Age of Onset  . Cancer Mother   . Heart disease Mother   . Diabetes Father     Family history reviewed  and not pertinent   Prior to Admission medications   Medication Sig Start Date End Date Taking? Authorizing Provider  Accu-Chek FastClix Lancets MISC USE TO CHECK BLOOD SUGAR 11/23/18   Volney American, [place]  ACCU-CHEK GUIDE test strip USE TO CHECK BLOOD SUGAR TID 08/02/17   Volney American, PA-C  acetaminophen (TYLENOL) 500 MG tablet Take 1,000 mg by mouth every 6 (six) hours as needed for moderate pain or headache.    [provider]  albuterol (PROVENTIL) (2.5 MG/3ML) 0.083% nebulizer solution USE 1 VIAL IN NEBULIZER EVERY 6 HOURS - and as needed. 04/01/19   Volney American, PA-C  albuterol (VENTOLIN HFA) 108 (90 Base) MCG/ACT inhaler INHALE 2 PUFFS INTO THE LUNGS EVERY 6 HOURS AS NEEDED FOR WHEEZING OR SHORTNESS OF BREATH 11/29/18   Volney American, PA-C  amitriptyline (ELAVIL) 50 MG tablet TAKE 1 TO 2 TABLETS(50 TO 100 MG) BY MOUTH AT BEDTIME 09/27/19   Volney American, PA-C  aspirin EC 81 MG tablet Take 81 mg by mouth daily.    [provider]  azelastine (ASTELIN) 0.1 % nasal spray USE 2 SPRAYS IN EACH NOSTRIL TWICE DAILY 12/18/19   Park Liter P, DO  B-D UF III MINI PEN NEEDLES 31G X 5 MM MISC USE TWICE DAILY 06/20/18   Volney American, PA-C  Blood Glucose Monitoring Suppl (ACCU-CHEK GUIDE) w/Device KIT U UTD 06/02/17   [provider]  buPROPion (WELLBUTRIN XL) 150 MG 24 hr tablet TAKE 1 TABLET(150 MG) BY MOUTH DAILY 12/16/19   Forner, Megan P, DO  cetirizine (ZYRTEC) 10 MG tablet TAKE 1 TABLET BY MOUTH DAILY 07/30/19   Volney American, PA-C  Cholecalciferol (VITAMIN D3) 5000 units TABS Take 5,000 Units by mouth daily.     [provider]  citalopram (CELEXA) 10 MG tablet TAKE 1 TABLET BY MOUTH  DAILY 01/06/20   Park Liter P, DO  diclofenac sodium (VOLTAREN) 1 % GEL Apply 1 application topically 4 (four) times daily as needed (pain).     [provider]  famotidine (PEPCID) 40 MG tablet Take 40 mg  by mouth daily.    [provider]  Fluticasone-Salmeterol (ADVAIR) 500-50 MCG/DOSE AEPB Inhale 1 puff into the lungs 2 (two) times daily. 04/30/18   Volney American, PA-C  furosemide (LASIX) 20 MG tablet TAKE 1 TABLET(20 MG) BY MOUTH TWICE DAILY 07/22/19   Volney American, PA-C  isosorbide mononitrate (IMDUR) 60 MG 24 hr tablet Take 60 mg by mouth daily. 03/29/19   [provider]  JARDIANCE 25 MG TABS tablet Take 25 mg by mouth daily. 03/18/19   [provider]  Lancets Misc. (ACCU-CHEK FASTCLIX LANCET) KIT 1 Units by Does not apply route 2 (two) times daily. 09/21/17   Volney American, PA-C  LANTUS SOLOSTAR 100 UNIT/ML Solostar Pen INJECT 75 UNITS [place] D. Patient taking differently: 90 Units. INJECT 75 UNITS  D. 07/25/18   Volney American, PA-C  losartan (COZAAR) 50 MG tablet Take 50 mg by mouth daily.    [provider]  magnesium gluconate (MAGONATE) 500 MG tablet Take 500 mg by mouth daily.    [provider]  meloxicam (MOBIC) 7.5 MG tablet TAKE 1 TABLET(7.5 MG) BY MOUTH DAILY 11/29/19  Schaad, Megan P, DO  metFORMIN (GLUCOPHAGE) 1000 MG tablet TAKE 1 TABLET BY MOUTH  TWICE DAILY 01/06/20   Wynetta Emery, Megan P, DO  metoprolol succinate (TOPROL-XL) 25 MG 24 hr tablet Take 25 mg by mouth daily.    [provider]  montelukast (SINGULAIR) 10 MG tablet TAKE 1 TABLET(10 MG) BY MOUTH DAILY 07/18/19   Park Liter P, DO  NEEDLE, DISP, 18 G 18G X 1" MISC Use as directed 07/18/19   Stoioff, Ronda Fairly, MD  nystatin cream (MYCOSTATIN) Apply 1 application topically 2 (two) times daily. 01/28/19   Volney American, PA-C  pantoprazole (PROTONIX) 40 MG tablet Take 1 tablet (40 mg total) by mouth 2 (two) times daily before a meal. 06/24/19   Volney American, PA-C  pregabalin (LYRICA) 150 MG capsule Take 1 capsule (150 mg total) by mouth 2 (two) times daily. 09/17/19   Gillis Santa, MD  primidone (MYSOLINE) 50 MG tablet TAKE  2 TABLETS(100 MG) BY MOUTH DAILY 02/20/19   Volney American, PA-C  rosuvastatin (CRESTOR) 20 MG tablet TAKE 1 TABLET BY MOUTH  DAILY 10/24/19   Eulogio Bear, NP  Semaglutide, 1 MG/DOSE, (OZEMPIC, 1 MG/DOSE,) 2 MG/1.5ML SOPN Inject 1 mg into the skin once a week.    [provider]  sucralfate (CARAFATE) 1 g tablet TAKE 1 TABLET(1 GRAM) BY MOUTH THREE TIMES DAILY AS NEEDED 02/06/19   Volney American, PA-C  SYRINGE-NEEDLE, DISP, 3 ML (LUER LOCK SAFETY SYRINGES) 21G X 1-1/2" 3 ML MISC Use as directed for testosterone administration 07/18/19   Stoioff, Ronda Fairly, MD  testosterone cypionate (DEPOTESTOSTERONE CYPIONATE) 200 MG/ML injection INJECT 1 ML IN THE MUSCLE EVERY 14 DAYS 11/08/19   Abbie Sons, MD    Physical Exam: Vitals:   01/11/20 1730 01/11/20 1745 01/11/20 1815 01/11/20 1900  BP: 134/74  131/60 120/71  Pulse: 95 92 93 91  Resp: 18 19 18  (!) 21  Temp:      TempSrc:      SpO2: 94% 94% 96% 97%  Weight:      Height:        Constitutional: NAD, calm, comfortable Vitals:   01/11/20 1730 01/11/20 1745 01/11/20 1815 01/11/20 1900  BP: 134/74  131/60 120/71  Pulse: 95 92 93 91  Resp: 18 19 18  (!) 21  Temp:      TempSrc:      SpO2: 94% 94% 96% 97%  Weight:      Height:       Eyes: PERRL, lids and conjunctivae normal ENMT: Mucous membranes are moist. Posterior pharynx clear of any exudate or lesions.Normal dentition.  Neck: normal, supple, no masses, no thyromegaly Respiratory: clear to  auscultation bilaterally, no wheezing, no crackles. Normal respiratory effort. No accessory muscle use.  Cardiovascular: Regular rate and rhythm, no murmurs / rubs / gallops. No extremity edema. 2+ pedal pulses. No carotid bruits.  Abdomen: no tenderness, no masses palpated. No hepatosplenomegaly. Bowel sounds positive.  Musculoskeletal: no clubbing / cyanosis. No joint deformity upper and lower extremities. Good ROM, no contractures. Normal muscle tone.  Skin: no  rashes, lesions, ulcers. No induration Neurologic: CN 2-12 grossly intact. Sensation intact, DTR normal. Strength 5/5 in all 4.  Psychiatric: Normal judgment and insight. Alert and oriented x 3. Normal mood.     Labs on Admission: I have personally reviewed following labs and imaging studies  CBC: Recent Labs  Lab 01/11/20 1131  WBC 6.1  HGB 17.3*  HCT 52.8*  MCV 94.3  PLT 147   Basic Metabolic Panel: Recent Labs  Lab 01/11/20 1131  NA 140  K 4.4  CL 103  CO2 23  GLUCOSE 146*  BUN 15  CREATININE 1.02  CALCIUM 8.8*  MG 2.2   GFR: Estimated Creatinine Clearance: 84.4 mL/min (by C-G formula based on SCr of 1.02 mg/dL). Liver Function Tests: Recent Labs  Lab 01/11/20 1131  AST 29  ALT 23  ALKPHOS 73  BILITOT 1.3*  PROT 7.7  ALBUMIN 3.4*   Recent Labs  Lab 01/11/20 1131  LIPASE 18   No results for input(s): AMMONIA in the last 168 hours. Coagulation Profile: No results for input(s): INR, PROTIME in the last 168 hours. Cardiac Enzymes: No results for input(s): CKTOTAL, CKMB, CKMBINDEX, TROPONINI in the last 168 hours. BNP (last 3 results) No results for input(s): PROBNP in the last 8760 hours. HbA1C: No results for input(s): HGBA1C in the last 72 hours. CBG: Recent Labs  Lab 01/11/20 1351 01/11/20 1636  GLUCAP 138* 113*   Lipid Profile: No results for input(s): CHOL, HDL, LDLCALC, TRIG, CHOLHDL, LDLDIRECT in the last 72 hours. Thyroid Function Tests: No results for input(s): TSH, T4TOTAL, FREET4, T3FREE, THYROIDAB in the last 72 hours. Anemia Panel: No results for input(s): VITAMINB12, FOLATE, FERRITIN, TIBC, IRON, RETICCTPCT in the last 72 hours. Urine analysis:    Component Value Date/Time   APPEARANCEUR Clear 12/24/2018 1050   GLUCOSEU 3+ (A) 12/24/2018 1050   BILIRUBINUR Negative 12/24/2018 1050   PROTEINUR Negative 12/24/2018 1050   NITRITE Negative 12/24/2018 1050   LEUKOCYTESUR Negative 12/24/2018 1050    Radiological Exams on  Admission: DG Chest 1 View  Result Date: 01/11/2020 CLINICAL DATA:  Shortness of breath. EXAM: CHEST  1 VIEW COMPARISON:  April 05, 2018. FINDINGS: The heart size and mediastinal contours are within normal limits. No pneumothorax or pleural effusion is noted. Right midlung and basilar opacities are noted concerning for pneumonia. Minimal left basilar subsegmental atelectasis is noted. The visualized skeletal structures are unremarkable. IMPRESSION: Right midlung and basilar opacities are noted concerning for pneumonia. Minimal left basilar subsegmental atelectasis. Electronically Signed   By: Marijo Conception M.D.   On: 01/11/2020 16:49   CT Angio Chest PE W and/or Wo Contrast  Result Date: 01/11/2020 CLINICAL DATA:  Headache and vomiting for 2 days, short of breath EXAM: CT ANGIOGRAPHY CHEST WITH CONTRAST TECHNIQUE: Multidetector CT imaging of the chest was performed using the standard protocol during bolus administration of intravenous contrast. Multiplanar CT image reconstructions and MIPs were obtained to evaluate the vascular anatomy. CONTRAST:  115m OMNIPAQUE IOHEXOL 350 MG/ML SOLN COMPARISON:  Chest x-ray 01/11/2020 FINDINGS: Cardiovascular: Satisfactory opacification of the pulmonary arteries to the segmental level. No evidence of pulmonary embolism. The main pulmonary artery is normal in caliber. Normal heart size. No significant pericardial effusion. The thoracic aorta is normal in caliber. Mild atherosclerotic plaque of the thoracic aorta. Moderate left anterior descending coronary artery calcifications and mild right and left circumflex coronary artery calcifications. Mediastinum/Nodes: Bilateral 1 cm hilar lymph nodes. No enlarged mediastinal or axillary lymph nodes. Thyroid gland, trachea, and esophagus demonstrate no significant findings. Lungs/Pleura: Diffuse ground-glass airspace opacities. No pleural effusion. No pneumothorax. Upper Abdomen: A 2.1 cm hepatic lobe fluid density lesion likely  represents a hepatic cyst (4:70). No acute abnormality. Musculoskeletal: No chest wall abnormality. No suspicious lytic or blastic osseous lesions. Cortical irregularity of the right lateral tenth rib likely represents an old healed fracture. No acute displaced fracture.  Multilevel degenerative changes of the spine. Review of the MIP images confirms the above findings. IMPRESSION: 1. Multifocal pneumonia with findings suggestive of COVID-19 infection. Followup PA and lateral chest X-ray is recommended in 3-4 weeks following therapy to ensure resolution and exclude underlying malignancy. 2. Bilateral hilar lymphadenopathy likely reactive in etiology. 3. Mild to moderate three-vessel coronary artery calcifications. 4.  Aortic Atherosclerosis (ICD10-I70.0). Electronically Signed   By: Iven Finn M.D.   On: 01/11/2020 18:40    EKG: Independently reviewed.  Normal sinus rhythm, no ST-T wave changes.  Assessment/Plan Principal Problem:   Pneumonia due to COVID-19 virus Active Problems:   Type 2 diabetes mellitus with hyperglycemia (HCC)   Asthma   RLS (restless legs syndrome)   Essential hypertension   Major depression   Tremor   Hypogonadism male   GERD (gastroesophageal reflux disease)   Acute hypoxic respiratory failure secondary to Covid pneumonia; Patient reports having flulike symptoms for few days associated with worsening shortness of breath and cough. Patient was hypoxic on arrival, O2 saturation 88% on room air. Covid PCR +, influenza negative. CT chest:  findings consistent with Covid pneumonia. O2 saturation improved to 97% on 3 L supplemental oxygen. Continue supplemental oxygen to keep saturation above 94%. Continue remdesivir for 5 days and Decadron for 10 days. Pharmacy consulted for remdesivir. Found to have elevated inflammatory markers. Continue to trend inflammatory markers. Try to wean down oxygen as much as tolerated.  Elevated troponin: Troponin slightly  elevated, this could be due to demand ischemia, patient denies any chest pain. Continue to trend troponin. Obtain 2D echo.  CAD: Continue aspirin, Imdur, metoprolol, losartan, Crestor.  Hypertension Continue losartan, metoprolol, Lasix.  Diabetes mellitus: Patient takes Lantus 75 mg daily at home. Start Lantus 50 mg at bedtime. Regular insulin sliding scale, Obtain hemoglobin A1c.  Asthma: Continue inhalers as needed.  Major depression Continue Celexa, Wellbutrin.  GERD: Continue omeprazole.   DVT prophylaxis: Lovenox Code Status: Full code Family Communication: No one at bedside Disposition Plan: Discharge home in 3 to 4 days. Consults called: None Admission status: Inpatient   Shawna Clamp MD [facility]   If 7PM-7AM, please contact night-coverage [email or web address]   01/11/2020, 7:16 PM

## 2020-01-11 NOTE — ED Triage Notes (Signed)
C/O headache and vomiting x 2 days.  AAOx3.  Skin warm and dry. NAD

## 2020-01-11 NOTE — Consult Note (Signed)
Remdesivir - Pharmacy Brief Note     A/P:  COVID +   Remdesivir 200 mg IVPB once followed by 100 mg IVPB daily x 4 days.   Cephus Shelling, PharmD Clinical Pharmacist   01/11/2020 6:39 PM

## 2020-01-11 NOTE — ED Notes (Signed)
Placed on 2L/ [place]. Patient denies SOB.  STates last used nebulizer 2-3 days ago.  No SOB/ DOE noted.

## 2020-01-11 NOTE — ED Provider Notes (Signed)
[facility] Department Provider Note  ____________________________________________   Event Date/Time   First MD Initiated Contact with Patient 01/11/20 1541     (approximate)  I have reviewed the triage vital signs and the nursing notes.   HISTORY  Chief Complaint Emesis and Headache   HPI Corey [name] is a 64 y.o. male with past medical history of HTN, HDL, GERD, DM, asthma and CAD status post bypass surgery who presents for assessment approximately 3 to 4 days of some nonbloody nonbilious vomiting, headache, myalgias, diarrhea, decreased appetite, cough, shortness of breath, and posttussive chest tightness.  Patient denies any measured fevers, wheezing, blood in stool or emesis, urinary symptoms, rash or recent falls or injuries.  Denies tobacco abuse, EtOH use or illicit drug use.  States he has used inhaler little bit over the last couple days but this has not helped.  He otherwise has been taking his medicines as directed.  He has not a vaccine against COVID-19.  No other acute concerns at this time.  He does note he has several family numbers at home with similar symptoms.         Past Medical History:  Diagnosis Date  . Asthma   . Diabetes ([place])   . GERD (gastroesophageal reflux disease)   . Hyperlipidemia   . Hypertension   . Legg-Perthes disease     Patient Active Problem List   Diagnosis Date Noted  . Pneumonia due to COVID-19 virus 01/11/2020  . Chronic bilateral low back pain with bilateral sciatica 12/06/2018  . Lumbar degenerative disc disease 12/06/2018  . Lumbar facet arthropathy 12/06/2018  . Sacroiliac joint pain 12/06/2018  . Chronic pain syndrome 12/06/2018  . Bilateral primary osteoarthritis of knee 12/06/2018  . GERD (gastroesophageal reflux disease) 11/12/2018  . Hyperlipidemia 02/03/2018  . Type 2 diabetes mellitus with hyperglycemia ([place]) 04/21/2017  . Asthma 04/21/2017  . RLS (restless legs syndrome)  04/21/2017  . Allergic rhinitis 04/21/2017  . Essential hypertension 04/21/2017  . Major depression 04/21/2017  . Tremor 04/21/2017  . Hypogonadism male 04/21/2017  . Headache 04/21/2017    Past Surgical History:  Procedure Laterality Date  . CARDIAC CATHETERIZATION    . COLONOSCOPY WITH PROPOFOL N/A 04/16/2019   Procedure: COLONOSCOPY WITH PROPOFOL;  Surgeon: Jonathon Bellows, MD;  Location: [facility] ENDOSCOPY;  Service: Gastroenterology;  Laterality: N/A;  . COLONOSCOPY WITH PROPOFOL N/A 06/14/2019   Procedure: COLONOSCOPY WITH PROPOFOL;  Surgeon: Jonathon Bellows, MD;  Location: [facility] ENDOSCOPY;  Service: Gastroenterology;  Laterality: N/A;  . COLONOSCOPY WITH PROPOFOL N/A 07/30/2019   Procedure: COLONOSCOPY WITH PROPOFOL;  Surgeon: Jonathon Bellows, MD;  Location: [facility] ENDOSCOPY;  Service: Gastroenterology;  Laterality: N/A;  . ESOPHAGOGASTRODUODENOSCOPY (EGD) WITH PROPOFOL N/A 04/16/2019   Procedure: ESOPHAGOGASTRODUODENOSCOPY (EGD) WITH PROPOFOL;  Surgeon: Jonathon Bellows, MD;  Location: [facility] ENDOSCOPY;  Service: Gastroenterology;  Laterality: N/A;  . Foot Surgery    . HIP SURGERY    . RIGHT/LEFT HEART CATH AND CORONARY ANGIOGRAPHY N/A 07/25/2018   Procedure: RIGHT/LEFT HEART CATH AND CORONARY ANGIOGRAPHY;  Surgeon: Yolonda Kida, MD;  Location: [place] CV LAB;  Service: Cardiovascular;  Laterality: N/A;    Prior to Admission medications   Medication Sig Start Date End Date Taking? Authorizing Provider  Accu-Chek FastClix Lancets MISC USE TO CHECK BLOOD SUGAR 11/23/18   Volney American, [place]  ACCU-CHEK GUIDE test strip USE TO CHECK BLOOD SUGAR TID 08/02/17   Volney American, PA-C  acetaminophen (TYLENOL) 500 MG tablet Take  1,000 mg by mouth every 6 (six) hours as needed for moderate pain or headache.    [provider]  albuterol (PROVENTIL) (2.5 MG/3ML) 0.083% nebulizer solution USE 1 VIAL IN NEBULIZER EVERY 6 HOURS - and as needed. 04/01/19   Volney American, PA-C   albuterol (VENTOLIN HFA) 108 (90 Base) MCG/ACT inhaler INHALE 2 PUFFS INTO THE LUNGS EVERY 6 HOURS AS NEEDED FOR WHEEZING OR SHORTNESS OF BREATH 11/29/18   Volney American, PA-C  amitriptyline (ELAVIL) 50 MG tablet TAKE 1 TO 2 TABLETS(50 TO 100 MG) BY MOUTH AT BEDTIME 09/27/19   Volney American, PA-C  aspirin EC 81 MG tablet Take 81 mg by mouth daily.    [provider]  azelastine (ASTELIN) 0.1 % nasal spray USE 2 SPRAYS IN EACH NOSTRIL TWICE DAILY 12/18/19   Park Liter P, DO  B-D UF III MINI PEN NEEDLES 31G X 5 MM MISC USE TWICE DAILY 06/20/18   Volney American, PA-C  Blood Glucose Monitoring Suppl (ACCU-CHEK GUIDE) w/Device KIT U UTD 06/02/17   [provider]  buPROPion (WELLBUTRIN XL) 150 MG 24 hr tablet TAKE 1 TABLET(150 MG) BY MOUTH DAILY 12/16/19   Reineck, Megan P, DO  cetirizine (ZYRTEC) 10 MG tablet TAKE 1 TABLET BY MOUTH DAILY 07/30/19   Volney American, PA-C  Cholecalciferol (VITAMIN D3) 5000 units TABS Take 5,000 Units by mouth daily.     [provider]  citalopram (CELEXA) 10 MG tablet TAKE 1 TABLET BY MOUTH  DAILY 01/06/20   Park Liter P, DO  diclofenac sodium (VOLTAREN) 1 % GEL Apply 1 application topically 4 (four) times daily as needed (pain).     [provider]  famotidine (PEPCID) 40 MG tablet Take 40 mg by mouth daily.    [provider]  Fluticasone-Salmeterol (ADVAIR) 500-50 MCG/DOSE AEPB Inhale 1 puff into the lungs 2 (two) times daily. 04/30/18   Volney American, PA-C  furosemide (LASIX) 20 MG tablet TAKE 1 TABLET(20 MG) BY MOUTH TWICE DAILY 07/22/19   Volney American, PA-C  isosorbide mononitrate (IMDUR) 60 MG 24 hr tablet Take 60 mg by mouth daily. 03/29/19   [provider]  JARDIANCE 25 MG TABS tablet Take 25 mg by mouth daily. 03/18/19   [provider]  Lancets Misc. (ACCU-CHEK FASTCLIX LANCET) KIT 1 Units by Does not apply route 2 (two) times daily. 09/21/17    Volney American, PA-C  LANTUS SOLOSTAR 100 UNIT/ML Solostar Pen INJECT 75 UNITS [place] D. Patient taking differently: 90 Units. INJECT 75 UNITS  D. 07/25/18   Volney American, PA-C  losartan (COZAAR) 50 MG tablet Take 50 mg by mouth daily.    [provider]  magnesium gluconate (MAGONATE) 500 MG tablet Take 500 mg by mouth daily.    [provider]  meloxicam (MOBIC) 7.5 MG tablet TAKE 1 TABLET(7.5 MG) BY MOUTH DAILY 11/29/19   Housewright, Megan P, DO  metFORMIN (GLUCOPHAGE) 1000 MG tablet TAKE 1 TABLET BY MOUTH  TWICE DAILY 01/06/20   Lensing, Megan P, DO  metoprolol succinate (TOPROL-XL) 25 MG 24 hr tablet Take 25 mg by mouth daily.    [provider]  montelukast (SINGULAIR) 10 MG tablet TAKE 1 TABLET(10 MG) BY MOUTH DAILY 07/18/19   Park Liter P, DO  NEEDLE, DISP, 18 G 18G X 1" MISC Use as directed 07/18/19   Stoioff, Ronda Fairly, MD  nystatin cream (MYCOSTATIN) Apply 1 application topically 2 (two) times daily. 01/28/19  Volney American, PA-C  pantoprazole (PROTONIX) 40 MG tablet Take 1 tablet (40 mg total) by mouth 2 (two) times daily before a meal. 06/24/19   Volney American, PA-C  pregabalin (LYRICA) 150 MG capsule Take 1 capsule (150 mg total) by mouth 2 (two) times daily. 09/17/19   Gillis Santa, MD  primidone (MYSOLINE) 50 MG tablet TAKE 2 TABLETS(100 MG) BY MOUTH DAILY 02/20/19   Volney American, PA-C  rosuvastatin (CRESTOR) 20 MG tablet TAKE 1 TABLET BY MOUTH  DAILY 10/24/19   Eulogio Bear, NP  Semaglutide, 1 MG/DOSE, (OZEMPIC, 1 MG/DOSE,) 2 MG/1.5ML SOPN Inject 1 mg into the skin once a week.    [provider]  sucralfate (CARAFATE) 1 g tablet TAKE 1 TABLET(1 GRAM) BY MOUTH THREE TIMES DAILY AS NEEDED 02/06/19   Volney American, PA-C  SYRINGE-NEEDLE, DISP, 3 ML (LUER LOCK SAFETY SYRINGES) 21G X 1-1/2" 3 ML MISC Use as directed for testosterone administration 07/18/19   Stoioff, Ronda Fairly, MD  testosterone cypionate  (DEPOTESTOSTERONE CYPIONATE) 200 MG/ML injection INJECT 1 ML IN THE MUSCLE EVERY 14 DAYS 11/08/19   Stoioff, Ronda Fairly, MD    Allergies Patient has no known allergies.  Family History  Problem Relation Age of Onset  . Cancer Mother   . Heart disease Mother   . Diabetes Father     Social History Social History   Tobacco Use  . Smoking status: Former Smoker    Packs/day: 0.50    Types: Cigarettes  . Smokeless tobacco: Current User    Types: Chew  . Tobacco comment: quit 40 years ago   Vaping Use  . Vaping Use: Never used  Substance Use Topics  . Alcohol use: Not Currently  . Drug use: Never    Review of Systems  Review of Systems  Constitutional: Positive for malaise/fatigue. Negative for chills and fever.  HENT: Negative for sore throat.   Eyes: Negative for pain.  Respiratory: Positive for cough and shortness of breath. Negative for stridor.   Cardiovascular: Positive for chest pain.  Gastrointestinal: Positive for abdominal pain, diarrhea, nausea and vomiting.  Genitourinary: Negative for dysuria.  Musculoskeletal: Positive for myalgias.  Skin: Negative for rash.  Neurological: Positive for headaches. Negative for seizures and loss of consciousness.  Psychiatric/Behavioral: Negative for suicidal ideas.  All other systems reviewed and are negative.     ____________________________________________   PHYSICAL EXAM:  VITAL SIGNS: ED Triage Vitals  Enc Vitals Group     BP 01/11/20 1129 138/73     Pulse Rate 01/11/20 1129 (!) 102     Resp 01/11/20 1129 16     Temp 01/11/20 1129 99.5 F (37.5 C)     Temp Source 01/11/20 1129 Oral     SpO2 01/11/20 1133 (!) 88 %     Weight 01/11/20 1127 212 lb 1.3 oz (96.2 kg)     Height 01/11/20 1127 _0  (1.727 m)     Head Circumference --      Peak Flow --      Pain Score 01/11/20 1127 0     Pain Loc --      Pain Edu? --      Excl. in [place]? --    Vitals:   01/11/20 1745 01/11/20 1815  BP:  131/60  Pulse: 92 93   Resp: 19 18  Temp:    SpO2: 94% 96%   Physical Exam Vitals and nursing note reviewed.  Constitutional:  Appearance: He is well-developed and well-nourished.  HENT:     Head: Normocephalic and atraumatic.     Right Ear: External ear normal.     Left Ear: External ear normal.     Nose: Nose normal.     Mouth/Throat:     Mouth: Mucous membranes are dry.  Eyes:     Conjunctiva/sclera: Conjunctivae normal.  Cardiovascular:     Rate and Rhythm: Normal rate and regular rhythm.     Heart sounds: No murmur heard.   Pulmonary:     Effort: Pulmonary effort is normal. No respiratory distress.     Breath sounds: Examination of the right-lower field reveals decreased breath sounds. Decreased breath sounds present. No wheezing.  Abdominal:     Palpations: Abdomen is soft.     Tenderness: There is no abdominal tenderness.  Musculoskeletal:        General: No edema.     Cervical back: Neck supple.  Skin:    General: Skin is warm and dry.     Capillary Refill: Capillary refill takes 2 to 3 seconds.  Neurological:     Mental Status: He is alert and oriented to person, place, and time.  Psychiatric:        Mood and Affect: Mood and affect and mood normal.     No pronator drift.  No finger dysmetria.  Cranial nerves II through XII grossly intact.  Patient is full and symmetric strength of all extremities. ____________________________________________   LABS (all labs ordered are listed, but only abnormal results are displayed)  Labs Reviewed  RESP PANEL BY RT-PCR (FLU A&B, COVID) ARPGX2 - Abnormal; Notable for the following components:      Result Value   SARS Coronavirus 2 by RT PCR POSITIVE (*)    All other components within normal limits  COMPREHENSIVE METABOLIC PANEL - Abnormal; Notable for the following components:   Glucose, Bld 146 (*)    Calcium 8.8 (*)    Albumin 3.4 (*)    Total Bilirubin 1.3 (*)    All other components within normal limits  CBC - Abnormal; Notable  for the following components:   Hemoglobin 17.3 (*)    HCT 52.8 (*)    RDW 16.9 (*)    All other components within normal limits  FIBRIN DERIVATIVES D-DIMER ([facility] ONLY) - Abnormal; Notable for the following components:   Fibrin derivatives D-dimer ([facility]) 893.99 (*)    All other components within normal limits  CBG MONITORING, ED - Abnormal; Notable for the following components:   Glucose-Capillary 138 (*)    All other components within normal limits  CBG MONITORING, ED - Abnormal; Notable for the following components:   Glucose-Capillary 113 (*)    All other components within normal limits  TROPONIN I (HIGH SENSITIVITY) - Abnormal; Notable for the following components:   Troponin I (High Sensitivity) 21 (*)    All other components within normal limits  LIPASE, BLOOD  BRAIN NATRIURETIC PEPTIDE  MAGNESIUM  PROCALCITONIN  URINALYSIS, COMPLETE (UACMP) WITH MICROSCOPIC  HEMOGLOBIN A1C  HIV ANTIBODY (ROUTINE TESTING W REFLEX)  CBC  CREATININE, SERUM  CBC WITH DIFFERENTIAL/PLATELET  COMPREHENSIVE METABOLIC PANEL  C-REACTIVE PROTEIN  FERRITIN  FIBRIN DERIVATIVES D-DIMER ([facility] ONLY)  TROPONIN I (HIGH SENSITIVITY)   ____________________________________________  EKG  Sinus rhythm with ventricular rate of 94, normal axis, unremarkable intervals, no clear evidence of acute ischemia or other significant underlying arrhythmia. ____________________________________________  RADIOLOGY  ED MD interpretation: Bilateral pneumonia without significant edema, effusion or  pneumothorax.  Official radiology report(s): DG Chest 1 View  Result Date: 01/11/2020 CLINICAL DATA:  Shortness of breath. EXAM: CHEST  1 VIEW COMPARISON:  April 05, 2018. FINDINGS: The heart size and mediastinal contours are within normal limits. No pneumothorax or pleural effusion is noted. Right midlung and basilar opacities are noted concerning for pneumonia. Minimal left basilar subsegmental atelectasis is noted. The  visualized skeletal structures are unremarkable. IMPRESSION: Right midlung and basilar opacities are noted concerning for pneumonia. Minimal left basilar subsegmental atelectasis. Electronically Signed   By: Marijo Conception M.D.   On: 01/11/2020 16:49   CT Angio Chest PE W and/or Wo Contrast  Result Date: 01/11/2020 CLINICAL DATA:  Headache and vomiting for 2 days, short of breath EXAM: CT ANGIOGRAPHY CHEST WITH CONTRAST TECHNIQUE: Multidetector CT imaging of the chest was performed using the standard protocol during bolus administration of intravenous contrast. Multiplanar CT image reconstructions and MIPs were obtained to evaluate the vascular anatomy. CONTRAST:  197m OMNIPAQUE IOHEXOL 350 MG/ML SOLN COMPARISON:  Chest x-ray 01/11/2020 FINDINGS: Cardiovascular: Satisfactory opacification of the pulmonary arteries to the segmental level. No evidence of pulmonary embolism. The main pulmonary artery is normal in caliber. Normal heart size. No significant pericardial effusion. The thoracic aorta is normal in caliber. Mild atherosclerotic plaque of the thoracic aorta. Moderate left anterior descending coronary artery calcifications and mild right and left circumflex coronary artery calcifications. Mediastinum/Nodes: Bilateral 1 cm hilar lymph nodes. No enlarged mediastinal or axillary lymph nodes. Thyroid gland, trachea, and esophagus demonstrate no significant findings. Lungs/Pleura: Diffuse ground-glass airspace opacities. No pleural effusion. No pneumothorax. Upper Abdomen: A 2.1 cm hepatic lobe fluid density lesion likely represents a hepatic cyst (4:70). No acute abnormality. Musculoskeletal: No chest wall abnormality. No suspicious lytic or blastic osseous lesions. Cortical irregularity of the right lateral tenth rib likely represents an old healed fracture. No acute displaced fracture. Multilevel degenerative changes of the spine. Review of the MIP images confirms the above findings. IMPRESSION: 1.  Multifocal pneumonia with findings suggestive of COVID-19 infection. Followup PA and lateral chest X-ray is recommended in 3-4 weeks following therapy to ensure resolution and exclude underlying malignancy. 2. Bilateral hilar lymphadenopathy likely reactive in etiology. 3. Mild to moderate three-vessel coronary artery calcifications. 4.  Aortic Atherosclerosis (ICD10-I70.0). Electronically Signed   By: MIven FinnM.D.   On: 01/11/2020 18:40    ____________________________________________   PROCEDURES  Procedure(s) performed (including Critical Care):  .Critical Care Performed by: SLucrezia Starch MD Authorized by: SLucrezia Starch MD   Critical care provider statement:    Critical care time (minutes):  45   Critical care time was exclusive of:  Separately billable procedures and treating other patients   Critical care was necessary to treat or prevent imminent or life-threatening deterioration of the following conditions:  Respiratory failure and dehydration   Critical care was time spent personally by me on the following activities:  Discussions with consultants, evaluation of patient's response to treatment, examination of patient, ordering and performing treatments and interventions, ordering and review of laboratory studies, ordering and review of radiographic studies, pulse oximetry, re-evaluation of patient's condition, obtaining history from patient or surrogate and review of old charts     ____________________________________________   INITIAL IMPRESSION / [place]/ ED COURSE      Patient presents above-stated exam for assessment of constellation of symptoms described above.  On arrival patient is noted to be hemodynamically stable and afebrile on room air.  Overall on trial  of ambulation patient's room his SPO2 dropped to 87% which improved back to 94% on 2 L  Differential includes but is not limited to pneumonia, DKA, metabolic derangements,  gastroenteritis, ACS, arrhythmia, acute anemia, PE, and pneumothorax.  Patient found to be Covid positive.  CT obtained due to elevated dimer shows evidence of bilateral ammonia no evidence of PE.  Patient has evidence of three-vessel coronary disease.  No other acute thoracic process identified on CT chest.  Lipase is negative, pancreatitis.  CMP with evidence of hyperglycemia with glucose of 146 with no evidence of acidosis or significant electrolyte or metabolic derangements.  CBC with no white count and hemoglobin of 7.3 no evidence of acute symptomatic anemia.  No wheezing to suggest asthma exacerbation.  BNP is 44 overall presentation is consistent with volume overload.  Troponin is 21 and is suspect this reflects some demand ischemia I have low suspicion for acute coronary thrombosis at this time.  We will plan to obtain a second 2-hour troponin.  Given patient is Covid positive he was given remdesivir and Decadron.  He will be admitted to hospital service for further evaluation management.      ____________________________________________   FINAL CLINICAL IMPRESSION(S) / ED DIAGNOSES  Final diagnoses:  COVID-19  Acute respiratory failure with hypoxia (HCC)  Positive D dimer  Troponin I above reference range    Medications  insulin aspart (novoLOG) injection 0-15 Units (0 Units Subcutaneous Not Given 01/11/20 1639)  dexamethasone (DECADRON) injection 6 mg (has no administration in time range)  remdesivir 200 mg in sodium chloride 0.9% 250 mL IVPB (has no administration in time range)    Followed by  remdesivir 100 mg in sodium chloride 0.9 % 100 mL IVPB (has no administration in time range)  enoxaparin (LOVENOX) injection 40 mg (has no administration in time range)  guaiFENesin-dextromethorphan (ROBITUSSIN DM) 100-10 MG/5ML syrup 10 mL (has no administration in time range)  chlorpheniramine-HYDROcodone (TUSSIONEX) 10-8 MG/5ML suspension 5 mL (has no administration in time range)   acetaminophen (TYLENOL) tablet 650 mg (has no administration in time range)  docusate sodium (COLACE) capsule 100 mg (has no administration in time range)  ondansetron (ZOFRAN) tablet 4 mg (has no administration in time range)    Or  ondansetron (ZOFRAN) injection 4 mg (has no administration in time range)  lactated ringers bolus 1,000 mL (0 mLs Intravenous Stopped 01/11/20 1821)  iohexol (OMNIPAQUE) 350 MG/ML injection 100 mL (100 mLs Intravenous Contrast Given 01/11/20 1807)     ED Discharge Orders    None       Note:  This document was prepared using Dragon voice recognition software and may include unintentional dictation errors.   Lucrezia Starch, MD 01/11/20 1859

## 2020-01-12 ENCOUNTER — Other Ambulatory Visit: Payer: Self-pay

## 2020-01-12 LAB — HIV ANTIBODY (ROUTINE TESTING W REFLEX): HIV Screen 4th Generation wRfx: NONREACTIVE

## 2020-01-12 LAB — CBG MONITORING, ED
Glucose-Capillary: 121 mg/dL — ABNORMAL HIGH (ref 70–99)
Glucose-Capillary: 158 mg/dL — ABNORMAL HIGH (ref 70–99)

## 2020-01-12 LAB — CBC WITH DIFFERENTIAL/PLATELET
Abs Immature Granulocytes: 0.02 10*3/uL (ref 0.00–0.07)
Basophils Absolute: 0 10*3/uL (ref 0.0–0.1)
Basophils Relative: 0 %
Eosinophils Absolute: 0 10*3/uL (ref 0.0–0.5)
Eosinophils Relative: 0 %
HCT: 51.7 % (ref 39.0–52.0)
Hemoglobin: 16.6 g/dL (ref 13.0–17.0)
Immature Granulocytes: 0 %
Lymphocytes Relative: 7 %
Lymphs Abs: 0.4 10*3/uL — ABNORMAL LOW (ref 0.7–4.0)
MCH: 30.6 pg (ref 26.0–34.0)
MCHC: 32.1 g/dL (ref 30.0–36.0)
MCV: 95.4 fL (ref 80.0–100.0)
Monocytes Absolute: 0.2 10*3/uL (ref 0.1–1.0)
Monocytes Relative: 3 %
Neutro Abs: 5.2 10*3/uL (ref 1.7–7.7)
Neutrophils Relative %: 90 %
Platelets: 187 10*3/uL (ref 150–400)
RBC: 5.42 MIL/uL (ref 4.22–5.81)
RDW: 16.9 % — ABNORMAL HIGH (ref 11.5–15.5)
WBC: 5.8 10*3/uL (ref 4.0–10.5)
nRBC: 0 % (ref 0.0–0.2)

## 2020-01-12 LAB — COMPREHENSIVE METABOLIC PANEL
ALT: 23 U/L (ref 0–44)
AST: 29 U/L (ref 15–41)
Albumin: 3.2 g/dL — ABNORMAL LOW (ref 3.5–5.0)
Alkaline Phosphatase: 72 U/L (ref 38–126)
Anion gap: 12 (ref 5–15)
BUN: 21 mg/dL (ref 8–23)
CO2: 22 mmol/L (ref 22–32)
Calcium: 8.4 mg/dL — ABNORMAL LOW (ref 8.9–10.3)
Chloride: 106 mmol/L (ref 98–111)
Creatinine, Ser: 0.92 mg/dL (ref 0.61–1.24)
GFR, Estimated: >60 mL/min (ref >60)
Glucose, Bld: 132 mg/dL — ABNORMAL HIGH (ref 70–99)
Potassium: 4.9 mmol/L (ref 3.5–5.1)
Sodium: 140 mmol/L (ref 135–145)
Total Bilirubin: 1.3 mg/dL — ABNORMAL HIGH (ref 0.3–1.2)
Total Protein: 7.5 g/dL (ref 6.5–8.1)

## 2020-01-12 LAB — FERRITIN: Ferritin: 195 ng/mL (ref 24–336)

## 2020-01-12 LAB — HEMOGLOBIN A1C
Hgb A1c MFr Bld: 8.3 % — ABNORMAL HIGH (ref 4.8–5.6)
Mean Plasma Glucose: 191.51 mg/dL

## 2020-01-12 LAB — GLUCOSE, CAPILLARY
Glucose-Capillary: 137 mg/dL — ABNORMAL HIGH (ref 70–99)
Glucose-Capillary: 154 mg/dL — ABNORMAL HIGH (ref 70–99)

## 2020-01-12 LAB — C-REACTIVE PROTEIN: CRP: 21.5 mg/dL — ABNORMAL HIGH (ref <1.0)

## 2020-01-12 LAB — FIBRIN DERIVATIVES D-DIMER (ARMC ONLY): Fibrin derivatives D-dimer (ARMC): 788.98 ng/mL (FEU) — ABNORMAL HIGH (ref 0.00–499.00)

## 2020-01-12 MED ORDER — ENOXAPARIN SODIUM 60 MG/0.6ML ~~LOC~~ SOLN
0.5000 mg/kg | SUBCUTANEOUS | Status: DC
  Administered 2020-01-12 – 2020-01-13 (×2): 47.5 mg via SUBCUTANEOUS
  Filled 2020-01-12 (×2): qty 0.6

## 2020-01-12 NOTE — ED Notes (Addendum)
Patient resting quietly at this time.

## 2020-01-12 NOTE — ED Notes (Deleted)
Nurse is at lunch at this time. Will be back soon.

## 2020-01-12 NOTE — Plan of Care (Signed)
  Problem: Education: Goal: Knowledge of risk factors and measures for prevention of condition will improve Outcome: Progressing   Problem: Coping: Goal: Psychosocial and spiritual needs will be supported Outcome: Progressing   Problem: Respiratory: Goal: Will maintain a patent airway Outcome: Progressing Goal: Complications related to the disease process, condition or treatment will be avoided or minimized Outcome: Progressing   

## 2020-01-12 NOTE — ED Notes (Signed)
Patient given urinal per request.

## 2020-01-12 NOTE — Progress Notes (Signed)
PROGRESS NOTE    Corey [name]  BMW:[id number] DOB: [birth year] DOA: 01/11/2020 PCP: Particia Nearing, PA-C    Assessment & Plan:   Principal Problem:   Pneumonia due to COVID-19 virus Active Problems:   Type 2 diabetes mellitus with hyperglycemia (HCC)   Asthma   RLS (restless legs syndrome)   Essential hypertension   Major depression   Tremor   Hypogonadism male   GERD (gastroesophageal reflux disease)   Corey [name] is a 64 y.o. male with medical history significant of hypertension, hyperlipidemia, GERD, diabetes, asthma, CAD s/p bypass surgery who presents to the emergency department with complaints of generalized body ache,  Headache, vomiting, diarrhea, decreased appetite, cough and progressive shortness of breath and chest tightness for 3-4 days.     Acute hypoxic respiratory failure secondary to Covid pneumonia O2 saturation was 88 on room air,  He was placed on 3 L supplemental oxygen. --treat COVID --Continue supplemental O2 to keep sats >=90%, wean as tolerated  COVID PNA CT chest:  findings consistent with Covid pneumonia. --started on Remdesivir and decadron on admission Plan: --cont Remdesivir --cont IV decadron --trend CRP  Elevated troponin 2/2 demand ischemia Troponin slightly elevated, this could be due to demand ischemia, patient denies any chest pain.  CAD: On home aspirin, Imdur, metoprolol, losartan, Crestor. --cont home regimen  Hypertension On home losartan, metoprolol, Lasix. --cont home regimen  Diabetes mellitus: Patient takes Lantus 75 mg daily at home. --cont Lantus 50u nightly --SSI  Asthma: Continue inhalers as needed.  Major depression On home Celexa, Wellbutrin. --cont home regimen  GERD:  Cont home PPI   DVT prophylaxis: Lovenox SQ Code Status: Full code  Family Communication:  Status is: inpatient Dispo:   The patient is from: home Anticipated d/c is to: home Anticipated d/c date is: 2-3  days Patient currently is not medically stable to d/c due to: covid, new O2, on tx   Subjective and Interval History:  Pt's main complaint was weakness.  No dyspnea or cough.  No N/V/D.   Objective: Vitals:   01/13/20 0454 01/13/20 0806 01/13/20 1140 01/13/20 1624  BP: 116/72 129/76 112/74 126/81  Pulse: 78 76 74 64  Resp: 16 17 18 17   Temp: 98.3 F (36.8 C) 98.9 F (37.2 C) 98 F (36.7 C) 98.5 F (36.9 C)  TempSrc: Oral   Oral  SpO2: 96% 94% 92% 93%  Weight:      Height:       No intake or output data in the 24 hours ending 01/13/20 1826 Filed Weights   01/11/20 1127  Weight: 96.2 kg    Examination:   Constitutional: NAD, AAOx3 HEENT: conjunctivae and lids normal, EOMI CV: No cyanosis.   RESP: clear, on 2L Extremities: venous stasis changes in BLE SKIN: warm, dry and intact Neuro: II - XII grossly intact.   Psych: Normal mood and affect.     Data Reviewed: I have personally reviewed following labs and imaging studies  CBC: Recent Labs  Lab 01/11/20 1131 01/11/20 1945 01/12/20 0324 01/13/20 0337  WBC 6.1 5.6 5.8 10.3  NEUTROABS  --   --  5.2  --   HGB 17.3* 15.9 16.6 15.8  HCT 52.8* 49.6 51.7 48.9  MCV 94.3 95.2 95.4 94.8  PLT 173 178 187 223   Basic Metabolic Panel: Recent Labs  Lab 01/11/20 1131 01/11/20 1945 01/12/20 0324 01/13/20 0337  NA 140  --  140 142  K 4.4  --  4.9 4.2  CL 103  --  106 106  CO2 23  --  22 22  GLUCOSE 146*  --  132* 161*  BUN 15  --  21 28*  CREATININE 1.02 1.00 0.92 1.06  CALCIUM 8.8*  --  8.4* 8.4*  MG 2.2  --   --  2.4   GFR: Estimated Creatinine Clearance: 81.3 mL/min (by C-G formula based on SCr of 1.06 mg/dL). Liver Function Tests: Recent Labs  Lab 01/11/20 1131 01/12/20 0324  AST 29 29  ALT 23 23  ALKPHOS 73 72  BILITOT 1.3* 1.3*  PROT 7.7 7.5  ALBUMIN 3.4* 3.2*   Recent Labs  Lab 01/11/20 1131  LIPASE 18   No results for input(s): AMMONIA in the last 168 hours. Coagulation Profile: No  results for input(s): INR, PROTIME in the last 168 hours. Cardiac Enzymes: No results for input(s): CKTOTAL, CKMB, CKMBINDEX, TROPONINI in the last 168 hours. BNP (last 3 results) No results for input(s): PROBNP in the last 8760 hours. HbA1C: Recent Labs    01/11/20 1945  HGBA1C 8.3*   CBG: Recent Labs  Lab 01/12/20 2044 01/13/20 0457 01/13/20 0810 01/13/20 1154 01/13/20 1639  GLUCAP 154* 130* 141* 206* 153*   Lipid Profile: No results for input(s): CHOL, HDL, LDLCALC, TRIG, CHOLHDL, LDLDIRECT in the last 72 hours. Thyroid Function Tests: No results for input(s): TSH, T4TOTAL, FREET4, T3FREE, THYROIDAB in the last 72 hours. Anemia Panel: Recent Labs    01/12/20 0324  FERRITIN 195   Sepsis Labs: Recent Labs  Lab 01/11/20 1131  PROCALCITON 0.17    Recent Results (from the past 240 hour(s))  Resp Panel by RT-PCR (Flu A&B, Covid) Nasopharyngeal Swab     Status: Abnormal   Collection Time: 01/11/20  4:06 PM   Specimen: Nasopharyngeal Swab; Nasopharyngeal(NP) swabs in vial transport medium  Result Value Ref Range Status   SARS Coronavirus 2 by RT PCR POSITIVE (A) NEGATIVE Final    Comment: RESULT CALLED TO, READ BACK BY AND VERIFIED WITH: JANE MARTIN 01/11/20 AT 1718 BY ACR (NOTE) SARS-CoV-2 target nucleic acids are DETECTED.  The SARS-CoV-2 RNA is generally detectable in upper respiratory specimens during the acute phase of infection. Positive results are indicative of the presence of the identified virus, but do not rule out bacterial infection or co-infection with other pathogens not detected by the test. Clinical correlation with patient history and other diagnostic information is necessary to determine patient infection status. The expected result is Negative.  Fact Sheet for Patients: BloggerCourse.com  Fact Sheet for Healthcare Providers: SeriousBroker.it  This test is not yet approved or cleared by the  [place] FDA and  has been authorized for detection and/or diagnosis of SARS-CoV-2 by FDA under an Emergency Use Authorization (EUA).  This EUA will remain in effect (meaning this test can  be used) for the duration of  the COVID-19 declaration under Section 564(b)(1) of the Act, 21 U.S.C. section 360bbb-3(b)(1), unless the authorization is terminated or revoked sooner.     Influenza A by PCR NEGATIVE NEGATIVE Final   Influenza B by PCR NEGATIVE NEGATIVE Final    Comment: (NOTE) The Xpert Xpress SARS-CoV-2/FLU/RSV plus assay is intended as an aid in the diagnosis of influenza from Nasopharyngeal swab specimens and should not be used as a sole basis for treatment. Nasal washings and aspirates are unacceptable for Xpert Xpress SARS-CoV-2/FLU/RSV testing.  Fact Sheet for Patients: BloggerCourse.com  Fact Sheet for Healthcare Providers: SeriousBroker.it  This test is not yet approved or  cleared by the [place] and has been authorized for detection and/or diagnosis of SARS-CoV-2 by FDA under an Emergency Use Authorization (EUA). This EUA will remain in effect (meaning this test can be used) for the duration of the COVID-19 declaration under Section 564(b)(1) of the Act, 21 U.S.C. section 360bbb-3(b)(1), unless the authorization is terminated or revoked.  Performed at [facility], [address], [place], [place] [address]       Radiology Studies: No results found.   Scheduled Meds: . aspirin EC  81 mg Oral Daily  . buPROPion  150 mg Oral Daily  . citalopram  10 mg Oral Daily  . docusate sodium  100 mg Oral BID  . enoxaparin (LOVENOX) injection  0.5 mg/kg Subcutaneous Q24H  . furosemide  20 mg Oral BID  . insulin aspart  0-15 Units Subcutaneous Q4H  . insulin glargine  50 Units Subcutaneous Q2200  . isosorbide mononitrate  60 mg Oral Daily  . losartan  50 mg Oral Daily  . methylPREDNISolone  (SOLU-MEDROL) injection  40 mg Intravenous Q8H  . metoprolol succinate  25 mg Oral Daily  . montelukast  10 mg Oral QHS  . pantoprazole  40 mg Oral BID AC  . rosuvastatin  20 mg Oral Daily  . sucralfate  1 g Oral BID   Continuous Infusions: . remdesivir 100 mg in NS 100 mL Stopped (01/13/20 1642)     LOS: 2 days     Corey Priestly, MD [facility] If 7PM-7AM, please contact night-coverage 01/13/2020, 6:26 PM

## 2020-01-12 NOTE — Progress Notes (Signed)
PHARMACIST - PHYSICIAN COMMUNICATION  CONCERNING:  Enoxaparin (Lovenox) for DVT Prophylaxis    RECOMMENDATION: Patient was prescribed enoxaparin 40mg  SQ q24 hours for VTE prophylaxis.   Filed Weights   01/11/20 1127  Weight: 96.2 kg (212 lb 1.3 oz)    Body mass index is 32.25 kg/m.  Estimated Creatinine Clearance: 93.6 mL/min (by C-G formula based on SCr of 0.92 mg/dL).   Based on [facility] policy patient is candidate for enoxaparin 0.5mg /kg TBW SQ every 24 hours based on BMI being >30.   DESCRIPTION: Pharmacy has adjusted enoxaparin dose per [facility] policy.  Patient is now receiving enoxaparin 47.5 mg every 24 hours    [facility], PharmD Clinical Pharmacist  01/12/2020 5:37 PM

## 2020-01-13 ENCOUNTER — Other Ambulatory Visit: Payer: Self-pay | Admitting: Family Medicine

## 2020-01-13 LAB — CBC
HCT: 48.9 % (ref 39.0–52.0)
Hemoglobin: 15.8 g/dL (ref 13.0–17.0)
MCH: 30.6 pg (ref 26.0–34.0)
MCHC: 32.3 g/dL (ref 30.0–36.0)
MCV: 94.8 fL (ref 80.0–100.0)
Platelets: 223 10*3/uL (ref 150–400)
RBC: 5.16 MIL/uL (ref 4.22–5.81)
RDW: 17 % — ABNORMAL HIGH (ref 11.5–15.5)
WBC: 10.3 10*3/uL (ref 4.0–10.5)
nRBC: 0 % (ref 0.0–0.2)

## 2020-01-13 LAB — BASIC METABOLIC PANEL
Anion gap: 14 (ref 5–15)
BUN: 28 mg/dL — ABNORMAL HIGH (ref 8–23)
CO2: 22 mmol/L (ref 22–32)
Calcium: 8.4 mg/dL — ABNORMAL LOW (ref 8.9–10.3)
Chloride: 106 mmol/L (ref 98–111)
Creatinine, Ser: 1.06 mg/dL (ref 0.61–1.24)
GFR, Estimated: >60 mL/min (ref >60)
Glucose, Bld: 161 mg/dL — ABNORMAL HIGH (ref 70–99)
Potassium: 4.2 mmol/L (ref 3.5–5.1)
Sodium: 142 mmol/L (ref 135–145)

## 2020-01-13 LAB — GLUCOSE, CAPILLARY
Glucose-Capillary: 130 mg/dL — ABNORMAL HIGH (ref 70–99)
Glucose-Capillary: 141 mg/dL — ABNORMAL HIGH (ref 70–99)
Glucose-Capillary: 206 mg/dL — ABNORMAL HIGH (ref 70–99)
Glucose-Capillary: 153 mg/dL — ABNORMAL HIGH (ref 70–99)
Glucose-Capillary: 293 mg/dL — ABNORMAL HIGH (ref 70–99)

## 2020-01-13 LAB — C-REACTIVE PROTEIN: CRP: 12.5 mg/dL — ABNORMAL HIGH (ref <1.0)

## 2020-01-13 LAB — MAGNESIUM: Magnesium: 2.4 mg/dL (ref 1.7–2.4)

## 2020-01-13 MED ORDER — INSULIN ASPART 100 UNIT/ML ~~LOC~~ SOLN
0.0000 [IU] | Freq: Three times a day (TID) | SUBCUTANEOUS | Status: DC
  Administered 2020-01-14: 12:00:00 8 [IU] via SUBCUTANEOUS
  Administered 2020-01-14: 09:00:00 5 [IU] via SUBCUTANEOUS
  Filled 2020-01-13 (×2): qty 1

## 2020-01-13 MED ORDER — METHYLPREDNISOLONE SODIUM SUCC 40 MG IJ SOLR
40.0000 mg | Freq: Three times a day (TID) | INTRAMUSCULAR | Status: DC
  Administered 2020-01-13 – 2020-01-14 (×4): 40 mg via INTRAVENOUS
  Filled 2020-01-13 (×4): qty 1

## 2020-01-13 NOTE — Progress Notes (Signed)
PROGRESS NOTE    Corey [name]  CLE:[id number] DOB: 1955/05/13 DOA: 01/11/2020 PCP: Particia Nearing, PA-C    Assessment & Plan:   Principal Problem:   Pneumonia due to COVID-19 virus Active Problems:   Type 2 diabetes mellitus with hyperglycemia (HCC)   Asthma   RLS (restless legs syndrome)   Essential hypertension   Major depression   Tremor   Hypogonadism male   GERD (gastroesophageal reflux disease)   Corey [name] is a 64 y.o. male with medical history significant of hypertension, hyperlipidemia, GERD, diabetes, asthma, CAD s/p bypass surgery who presents to the emergency department with complaints of generalized body ache,  Headache, vomiting, diarrhea, decreased appetite, cough and progressive shortness of breath and chest tightness for 3-4 days.     Acute hypoxic respiratory failure secondary to Covid pneumonia O2 saturation was 88 on room air,  He was placed on 3 L supplemental oxygen. --treat COVID --Continue supplemental O2 to keep sats >=90%, wean as tolerated --Will get O2 sats while walking before discharge.  COVID PNA CT chest:  findings consistent with Covid pneumonia. --started on Remdesivir and decadron on admission --CRP 21.5 on presentation Plan: --cont Remdesivir --increase steroid to solumedrol 40 mg q8h due to high presenting CRP --trend CRP  Elevated troponin 2/2 demand ischemia Troponin slightly elevated, this could be due to demand ischemia, patient denies any chest pain.  CAD: On home aspirin, Imdur, metoprolol, losartan, Crestor. --cont home regimen  Hypertension On home losartan, metoprolol, Lasix. --BP wnl --cont home regimen.  Diabetes mellitus: Patient takes Lantus 75 mg daily at home. --cont Lantus 50u nightly --SSI TID  Asthma: Continue inhalers as needed.  Major depression On home Celexa, Wellbutrin. --cont home regimen  GERD:  Cont home PPI   DVT prophylaxis: Lovenox SQ Code Status: Full code   Family Communication:  Status is: inpatient Dispo:   The patient is from: home Anticipated d/c is to: home Anticipated d/c date is: 1-2 days Patient currently is not medically stable to d/c due to: covid, new O2, on tx   Subjective and Interval History:  Pt reported doing ok.  Little bit of cough.  No N/V/D.     Objective: Vitals:   01/13/20 0454 01/13/20 0806 01/13/20 1140 01/13/20 1624  BP: 116/72 129/76 112/74 126/81  Pulse: 78 76 74 64  Resp: 16 17 18 17   Temp: 98.3 F (36.8 C) 98.9 F (37.2 C) 98 F (36.7 C) 98.5 F (36.9 C)  TempSrc: Oral   Oral  SpO2: 96% 94% 92% 93%  Weight:      Height:       No intake or output data in the 24 hours ending 01/13/20 2015 Filed Weights   01/11/20 1127  Weight: 96.2 kg    Examination:   Constitutional: NAD, AAOx3 HEENT: conjunctivae and lids normal, EOMI CV: No cyanosis.   RESP: No distress, on 2L Extremities: No effusions, edema in BLE SKIN: warm, dry and intact Neuro: II - XII grossly intact.   Psych: Normal mood and affect.  Appropriate judgement and reason   Data Reviewed: I have personally reviewed following labs and imaging studies  CBC: Recent Labs  Lab 01/11/20 1131 01/11/20 1945 01/12/20 0324 01/13/20 0337  WBC 6.1 5.6 5.8 10.3  NEUTROABS  --   --  5.2  --   HGB 17.3* 15.9 16.6 15.8  HCT 52.8* 49.6 51.7 48.9  MCV 94.3 95.2 95.4 94.8  PLT 173 178 187 223   Basic Metabolic  Panel: Recent Labs  Lab 01/11/20 1131 01/11/20 1945 01/12/20 0324 01/13/20 0337  NA 140  --  140 142  K 4.4  --  4.9 4.2  CL 103  --  106 106  CO2 23  --  22 22  GLUCOSE 146*  --  132* 161*  BUN 15  --  21 28*  CREATININE 1.02 1.00 0.92 1.06  CALCIUM 8.8*  --  8.4* 8.4*  MG 2.2  --   --  2.4   GFR: Estimated Creatinine Clearance: 81.3 mL/min (by C-G formula based on SCr of 1.06 mg/dL). Liver Function Tests: Recent Labs  Lab 01/11/20 1131 01/12/20 0324  AST 29 29  ALT 23 23  ALKPHOS 73 72  BILITOT 1.3* 1.3*   PROT 7.7 7.5  ALBUMIN 3.4* 3.2*   Recent Labs  Lab 01/11/20 1131  LIPASE 18   No results for input(s): AMMONIA in the last 168 hours. Coagulation Profile: No results for input(s): INR, PROTIME in the last 168 hours. Cardiac Enzymes: No results for input(s): CKTOTAL, CKMB, CKMBINDEX, TROPONINI in the last 168 hours. BNP (last 3 results) No results for input(s): PROBNP in the last 8760 hours. HbA1C: Recent Labs    01/11/20 1945  HGBA1C 8.3*   CBG: Recent Labs  Lab 01/12/20 2044 01/13/20 0457 01/13/20 0810 01/13/20 1154 01/13/20 1639  GLUCAP 154* 130* 141* 206* 153*   Lipid Profile: No results for input(s): CHOL, HDL, LDLCALC, TRIG, CHOLHDL, LDLDIRECT in the last 72 hours. Thyroid Function Tests: No results for input(s): TSH, T4TOTAL, FREET4, T3FREE, THYROIDAB in the last 72 hours. Anemia Panel: Recent Labs    01/12/20 0324  FERRITIN 195   Sepsis Labs: Recent Labs  Lab 01/11/20 1131  PROCALCITON 0.17    Recent Results (from the past 240 hour(s))  Resp Panel by RT-PCR (Flu A&B, Covid) Nasopharyngeal Swab     Status: Abnormal   Collection Time: 01/11/20  4:06 PM   Specimen: Nasopharyngeal Swab; Nasopharyngeal(NP) swabs in vial transport medium  Result Value Ref Range Status   SARS Coronavirus 2 by RT PCR POSITIVE (A) NEGATIVE Final    Comment: RESULT CALLED TO, READ BACK BY AND VERIFIED WITH: JANE MARTIN 01/11/20 AT 1718 BY ACR (NOTE) SARS-CoV-2 target nucleic acids are DETECTED.  The SARS-CoV-2 RNA is generally detectable in upper respiratory specimens during the acute phase of infection. Positive results are indicative of the presence of the identified virus, but do not rule out bacterial infection or co-infection with other pathogens not detected by the test. Clinical correlation with patient history and other diagnostic information is necessary to determine patient infection status. The expected result is Negative.  Fact Sheet for  Patients: BloggerCourse.com  Fact Sheet for Healthcare Providers: SeriousBroker.it  This test is not yet approved or cleared by the [place] FDA and  has been authorized for detection and/or diagnosis of SARS-CoV-2 by FDA under an Emergency Use Authorization (EUA).  This EUA will remain in effect (meaning this test can  be used) for the duration of  the COVID-19 declaration under Section 564(b)(1) of the Act, 21 U.S.C. section 360bbb-3(b)(1), unless the authorization is terminated or revoked sooner.     Influenza A by PCR NEGATIVE NEGATIVE Final   Influenza B by PCR NEGATIVE NEGATIVE Final    Comment: (NOTE) The Xpert Xpress SARS-CoV-2/FLU/RSV plus assay is intended as an aid in the diagnosis of influenza from Nasopharyngeal swab specimens and should not be used as a sole basis for treatment. Nasal washings  and aspirates are unacceptable for Xpert Xpress SARS-CoV-2/FLU/RSV testing.  Fact Sheet for Patients: BloggerCourse.com  Fact Sheet for Healthcare Providers: SeriousBroker.it  This test is not yet approved or cleared by the [place] FDA and has been authorized for detection and/or diagnosis of SARS-CoV-2 by FDA under an Emergency Use Authorization (EUA). This EUA will remain in effect (meaning this test can be used) for the duration of the COVID-19 declaration under Section 564(b)(1) of the Act, 21 U.S.C. section 360bbb-3(b)(1), unless the authorization is terminated or revoked.  Performed at [facility], [address], [place], [place] [address]       Radiology Studies: No results found.   Scheduled Meds: . aspirin EC  81 mg Oral Daily  . buPROPion  150 mg Oral Daily  . citalopram  10 mg Oral Daily  . docusate sodium  100 mg Oral BID  . enoxaparin (LOVENOX) injection  0.5 mg/kg Subcutaneous Q24H  . furosemide  20 mg Oral BID  . insulin  aspart  0-15 Units Subcutaneous Q4H  . insulin glargine  50 Units Subcutaneous Q2200  . isosorbide mononitrate  60 mg Oral Daily  . losartan  50 mg Oral Daily  . methylPREDNISolone (SOLU-MEDROL) injection  40 mg Intravenous Q8H  . metoprolol succinate  25 mg Oral Daily  . montelukast  10 mg Oral QHS  . pantoprazole  40 mg Oral BID AC  . rosuvastatin  20 mg Oral Daily  . sucralfate  1 g Oral BID   Continuous Infusions: . remdesivir 100 mg in NS 100 mL Stopped (01/13/20 1642)     LOS: 2 days     Corey Priestly, MD [facility] If 7PM-7AM, please contact night-coverage 01/13/2020, 8:15 PM

## 2020-01-14 LAB — CBC
HCT: 53.7 % — ABNORMAL HIGH (ref 39.0–52.0)
Hemoglobin: 17.7 g/dL — ABNORMAL HIGH (ref 13.0–17.0)
MCH: 30.5 pg (ref 26.0–34.0)
MCHC: 33 g/dL (ref 30.0–36.0)
MCV: 92.6 fL (ref 80.0–100.0)
Platelets: 258 10*3/uL (ref 150–400)
RBC: 5.8 MIL/uL (ref 4.22–5.81)
RDW: 16.6 % — ABNORMAL HIGH (ref 11.5–15.5)
WBC: 9.5 10*3/uL (ref 4.0–10.5)
nRBC: 0 % (ref 0.0–0.2)

## 2020-01-14 LAB — C-REACTIVE PROTEIN: CRP: 7.3 mg/dL — ABNORMAL HIGH (ref <1.0)

## 2020-01-14 LAB — GLUCOSE, CAPILLARY
Glucose-Capillary: 220 mg/dL — ABNORMAL HIGH (ref 70–99)
Glucose-Capillary: 292 mg/dL — ABNORMAL HIGH (ref 70–99)

## 2020-01-14 LAB — BASIC METABOLIC PANEL
Anion gap: 14 (ref 5–15)
BUN: 23 mg/dL (ref 8–23)
CO2: 24 mmol/L (ref 22–32)
Calcium: 9.3 mg/dL (ref 8.9–10.3)
Chloride: 103 mmol/L (ref 98–111)
Creatinine, Ser: 1.07 mg/dL (ref 0.61–1.24)
GFR, Estimated: >60 mL/min (ref >60)
Glucose, Bld: 212 mg/dL — ABNORMAL HIGH (ref 70–99)
Potassium: 4.3 mmol/L (ref 3.5–5.1)
Sodium: 141 mmol/L (ref 135–145)

## 2020-01-14 LAB — MAGNESIUM: Magnesium: 2.2 mg/dL (ref 1.7–2.4)

## 2020-01-14 MED ORDER — DEXAMETHASONE 6 MG PO TABS: 6.0000 mg | ORAL_TABLET | Freq: Two times a day (BID) | ORAL | 0 refills | Status: AC

## 2020-01-14 MED ORDER — MELOXICAM 7.5 MG PO TABS: 7.5000 mg | ORAL_TABLET | Freq: Every day | ORAL | Status: DC | PRN

## 2020-01-14 MED ORDER — DEXAMETHASONE 4 MG PO TABS
6.0000 mg | ORAL_TABLET | Freq: Every day | ORAL | Status: DC
  Administered 2020-01-14: 15:00:00 6 mg via ORAL
  Filled 2020-01-14: qty 2

## 2020-01-14 MED ORDER — GUAIFENESIN-DM 100-10 MG/5ML PO SYRP: 10.0000 mL | ORAL_SOLUTION | ORAL | 0 refills | Status: DC | PRN

## 2020-01-14 NOTE — Discharge Instructions (Signed)
10 Things You Can Do to Manage Your COVID-19 Symptoms at Home °If you have possible or confirmed COVID-19: °1. Stay home from work and school. And stay away from other public places. If you must go out, avoid using any kind of public transportation, ridesharing, or taxis. °2. Monitor your symptoms carefully. If your symptoms get worse, call your healthcare provider immediately. °3. Get rest and stay hydrated. °4. If you have a medical appointment, call the healthcare provider ahead of time and tell them that you have or may have COVID-19. °5. For medical emergencies, call 911 and notify the dispatch personnel that you have or may have COVID-19. °6. Cover your cough and sneezes with a tissue or use the inside of your elbow. °7. Wash your hands often with soap and water for at least 20 seconds or clean your hands with an alcohol-based hand sanitizer that contains at least 60% alcohol. °8. As much as possible, stay in a specific room and away from other people in your home. Also, you should use a separate bathroom, if available. If you need to be around other people in or outside of the home, wear a mask. °9. Avoid sharing personal items with other people in your household, like dishes, towels, and bedding. °10. Clean all surfaces that are touched often, like counters, tabletops, and doorknobs. Use household cleaning sprays or wipes according to the label instructions. °cdc.gov/coronavirus °08/01/2018 °This information is not intended to replace advice given to you by your health care provider. Make sure you discuss any questions you have with your health care provider. °Document Revised: 01/03/2019 Document Reviewed: 01/03/2019 °Elsevier Patient Education © 2020 [employer] ° ° °Acute Respiratory Failure, Adult ° °Acute respiratory failure occurs when there is not enough oxygen passing from your lungs to your body. When this happens, your lungs have trouble removing carbon dioxide from the blood. This causes your  blood oxygen level to drop too low as carbon dioxide builds up. °Acute respiratory failure is a medical emergency. It can develop quickly, but it is temporary if treated promptly. Your lung capacity, or how much air your lungs can hold, may improve with time, exercise, and treatment. °What are the causes? °There are many possible causes of acute respiratory failure, including: °· Lung injury. °· Chest injury or damage to the ribs or tissues near the lungs. °· Lung conditions that affect the flow of air and blood into and out of the lungs, such as pneumonia, acute respiratory distress syndrome, and cystic fibrosis. °· Medical conditions, such as strokes or spinal cord injuries, that affect the muscles and nerves that control breathing. °· Blood infection (sepsis). °· Inflammation of the pancreas (pancreatitis). °· A blood clot in the lungs (pulmonary embolism). °· A large-volume blood transfusion. °· Burns. °· Near-drowning. °· Seizure. °· Smoke inhalation. °· Reaction to medicines. °· Alcohol or drug overdose. °What increases the risk? °This condition is more likely to develop in people who have: °· A blocked airway. °· Asthma. °· A condition or disease that damages or weakens the muscles, nerves, bones, or tissues that are involved in breathing. °· A serious infection. °· A health problem that blocks the unconscious reflex that is involved in breathing, such as hypothyroidism or sleep apnea. °· A lung injury or trauma. °What are the signs or symptoms? °Trouble breathing is the main symptom of acute respiratory failure. Symptoms may also include: °· Rapid breathing. °· Restlessness or anxiety. °· Skin, lips, or fingernails that appear blue (cyanosis). °· Rapid   heart rate. °· Abnormal heart rhythms (arrhythmias). °· Confusion or changes in behavior. °· Tiredness or loss of energy. °· Feeling sleepy or having a loss of consciousness. °How is this diagnosed? °Your health care provider can diagnose acute respiratory  failure with a medical history and physical exam. During the exam, your health care provider will listen to your heart and check for crackling or wheezing sounds in your lungs. Your may also have tests to confirm the diagnosis and determine what is causing respiratory failure. These tests may include: °· Measuring the amount of oxygen in your blood (pulse oximetry). The measurement comes from a small device that is placed on your finger, earlobe, or toe. °· Other blood tests to measure blood gases and to look for signs of infection. °· Sampling your cerebral spinal fluid or tracheal fluid to check for infections. °· Chest X-ray to look for fluid in spaces that should be filled with air. °· Electrocardiogram (ECG) to look at the heart's electrical activity. °How is this treated? °Treatment for this condition usually takes places in a hospital intensive care unit (ICU). Treatment depends on what is causing the condition. It may include one or more treatments until your symptoms improve. Treatment may include: °· Supplemental oxygen. Extra oxygen is given through a tube in the nose, a face mask, or a hood. °· A device such as a continuous positive airway pressure (CPAP) or bi-level positive airway pressure (BiPAP or BPAP) machine. This treatment uses mild air pressure to keep the airways open. A mask or other device will be placed over your nose or mouth. A tube that is connected to a motor will deliver oxygen through the mask. °· Ventilator. This treatment helps move air into and out of the lungs. This may be done with a bag and mask or a machine. For this treatment, a tube is placed in your windpipe (trachea) so air and oxygen can flow to the lungs. °· Extracorporeal membrane oxygenation (ECMO). This treatment temporarily takes over the function of the heart and lungs, supplying oxygen and removing carbon dioxide. ECMO gives the lungs a chance to recover. It may be used if a ventilator is not  effective. °· Tracheostomy. This is a procedure that creates a hole in the neck to insert a breathing tube. °· Receiving fluids and medicines. °· Rocking the bed to help breathing. °Follow these instructions at home: °· Take over-the-counter and prescription medicines only as told by your health care provider. °· Return to normal activities as told by your health care provider. Ask your health care provider what activities are safe for you. °· Keep all follow-up visits as told by your health care provider. This is important. °How is this prevented? °Treating infections and medical conditions that may lead to acute respiratory failure can help prevent the condition from developing. °Contact a health care provider if: °· You have a fever. °· Your symptoms do not improve or they get worse. °Get help right away if: °· You are having trouble breathing. °· You lose consciousness. °· Your have cyanosis or turn blue. °· You develop a rapid heart rate. °· You are confused. °These symptoms may represent a serious problem that is an emergency. Do not wait to see if the symptoms will go away. Get medical help right away. Call your local emergency services (911 in the [place] Do not drive yourself to the hospital. °This information is not intended to replace advice given to you by your health care provider. Make sure   you discuss any questions you have with your health care provider. °Document Revised: 12/30/2016 Document Reviewed: 08/05/2015 °Elsevier Patient Education © 2020 [employer] ° ° °COVID-19 Frequently Asked Questions °COVID-19 (coronavirus disease) is an infection that is caused by a large family of viruses. Some viruses cause illness in people and others cause illness in animals like camels, cats, and bats. In some cases, the viruses that cause illness in animals can spread to humans. °Where did the coronavirus come from? °In December 2019, [place] told the World Health Organization (WHO) of several cases of lung  disease (human respiratory illness). These cases were linked to an open seafood and livestock market in the city of [place]. The link to the seafood and livestock market suggests that the virus may have spread from animals to humans. However, since that first outbreak in December, the virus has also been shown to spread from person to person. °What is the name of the disease and the virus? °Disease name °Early on, this disease was called novel coronavirus. This is because scientists determined that the disease was caused by a new (novel) respiratory virus. The World Health Organization (WHO) has now named the disease COVID-19, or coronavirus disease. °Virus name °The virus that causes the disease is called severe acute respiratory syndrome coronavirus 2 (SARS-CoV-2). °More information on disease and virus naming °World Health Organization (WHO): www.who.int/emergencies/diseases/novel-coronavirus-2019/technical-guidance/naming-the-coronavirus-disease-(covid-2019)-and-the-virus-that-causes-it °Who is at risk for complications from coronavirus disease? °Some people may be at higher risk for complications from coronavirus disease. This includes older adults and people who have chronic diseases, such as heart disease, diabetes, and lung disease. °If you are at higher risk for complications, take these extra precautions: °· Stay home as much as possible. °· Avoid social gatherings and travel. °· Avoid close contact with others. Stay at least 6 ft (2 m) away from others, if possible. °· Wash your hands often with soap and water for at least 20 seconds. °· Avoid touching your face, mouth, nose, or eyes. °· Keep supplies on hand at home, such as food, medicine, and cleaning supplies. °· If you must go out in public, wear a cloth face covering or face mask. Make sure your mask covers your nose and mouth. °How does coronavirus disease spread? °The virus that causes coronavirus disease spreads easily from person to person (is  contagious). You may catch the virus by: °· Breathing in droplets from an infected person. Droplets can be spread by a person breathing, speaking, singing, coughing, or sneezing. °· Touching something, like a table or a doorknob, that was exposed to the virus (contaminated) and then touching your mouth, nose, or eyes. °Can I get the virus from touching surfaces or objects? °There is still a lot that we do not know about the virus that causes coronavirus disease. Scientists are basing a lot of information on what they know about similar viruses, such as: °· Viruses cannot generally survive on surfaces for long. They need a human body (host) to survive. °· It is more likely that the virus is spread by close contact with people who are sick (direct contact), such as through: °? Shaking hands or hugging. °? Breathing in respiratory droplets that travel through the air. Droplets can be spread by a person breathing, speaking, singing, coughing, or sneezing. °· It is less likely that the virus is spread when a person touches a surface or object that has the virus on it (indirect contact). The virus may be able to enter the body if the person touches   a surface or object and then touches his or her face, eyes, nose, or mouth. °Can a person spread the virus without having symptoms of the disease? °It may be possible for the virus to spread before a person has symptoms of the disease, but this is most likely not the main way the virus is spreading. It is more likely for the virus to spread by being in close contact with people who are sick and breathing in the respiratory droplets spread by a person breathing, speaking, singing, coughing, or sneezing. °What are the symptoms of coronavirus disease? °Symptoms vary from person to person and can range from mild to severe. Symptoms may include: °· Fever or chills. °· Cough. °· Difficulty breathing or feeling short of breath. °· Headaches, body aches, or muscle aches. °· Runny or  stuffy (congested) nose. °· Sore throat. °· New loss of taste or smell. °· Nausea, vomiting, or diarrhea. °These symptoms can appear anywhere from 2 to 14 days after you have been exposed to the virus. Some people may not have any symptoms. If you develop symptoms, call your health care provider. People with severe symptoms may need hospital care. °Should I be tested for this virus? °Your health care provider will decide whether to test you based on your symptoms, history of exposure, and your risk factors. °How does a health care provider test for this virus? °Health care providers will collect samples to send for testing. Samples may include: °· Taking a swab of fluid from the back of your nose and throat, your nose, or your throat. °· Taking fluid from the lungs by having you cough up mucus (sputum) into a sterile cup. °· Taking a blood sample. °Is there a treatment or vaccine for this virus? °Currently, there is no vaccine to prevent coronavirus disease. Also, there are no medicines like antibiotics or antivirals to treat the virus. A person who becomes sick is given supportive care, which means rest and fluids. A person may also relieve his or her symptoms by using over-the-counter medicines that treat sneezing, coughing, and runny nose. These are the same medicines that a person takes for the common cold. °If you develop symptoms, call your health care provider. People with severe symptoms may need hospital care. °What can I do to protect myself and my family from this virus? ° °  ° °You can protect yourself and your family by taking the same actions that you would take to prevent the spread of other viruses. Take the following actions: °· Wash your hands often with soap and water for at least 20 seconds. If soap and water are not available, use alcohol-based hand sanitizer. °· Avoid touching your face, mouth, nose, or eyes. °· Cough or sneeze into a tissue, sleeve, or elbow. Do not cough or sneeze into your  hand or the air. °? If you cough or sneeze into a tissue, throw it away immediately and wash your hands. °· Disinfect objects and surfaces that you frequently touch every day. °· Stay away from people who are sick. °· Avoid going out in public, follow guidance from your state and local health authorities. °· Avoid crowded indoor spaces. Stay at least 6 ft (2 m) away from others. °· If you must go out in public, wear a cloth face covering or face mask. Make sure your mask covers your nose and mouth. °· Stay home if you are sick, except to get medical care. Call your health care provider before you get medical care.   Your health care provider will tell you how long to stay home. °· Make sure your vaccines are up to date. Ask your health care provider what vaccines you need. °What should I do if I need to travel? °Follow travel recommendations from your local health authority, the CDC, and WHO. °Travel information and advice °· Centers for Disease Control and Prevention (CDC): www.cdc.gov/coronavirus/2019-ncov/travelers/index.html °· World Health Organization (WHO): www.who.int/emergencies/diseases/novel-coronavirus-2019/travel-advice °Know the risks and take action to protect your health °· You are at higher risk of getting coronavirus disease if you are traveling to areas with an outbreak or if you are exposed to travelers from areas with an outbreak. °· Wash your hands often and practice good hygiene to lower the risk of catching or spreading the virus. °What should I do if I am sick? °General instructions to stop the spread of infection °· Wash your hands often with soap and water for at least 20 seconds. If soap and water are not available, use alcohol-based hand sanitizer. °· Cough or sneeze into a tissue, sleeve, or elbow. Do not cough or sneeze into your hand or the air. °· If you cough or sneeze into a tissue, throw it away immediately and wash your hands. °· Stay home unless you must get medical care. Call  your health care provider or local health authority before you get medical care. °· Avoid public areas. Do not take public transportation, if possible. °· If you can, wear a mask if you must go out of the house or if you are in close contact with someone who is not sick. Make sure your mask covers your nose and mouth. °Keep your home clean °· Disinfect objects and surfaces that are frequently touched every day. This may include: °? Counters and tables. °? Doorknobs and light switches. °? Sinks and faucets. °? Electronics such as phones, remote controls, keyboards, computers, and tablets. °· Wash dishes in hot, soapy water or use a dishwasher. Air-dry your dishes. °· Wash laundry in hot water. °Prevent infecting other household members °· Let healthy household members care for children and pets, if possible. If you have to care for children or pets, wash your hands often and wear a mask. °· Sleep in a different bedroom or bed, if possible. °· Do not share personal items, such as razors, toothbrushes, deodorant, combs, brushes, towels, and washcloths. °Where to find more information °Centers for Disease Control and Prevention (CDC) °· Information and news updates: www.cdc.gov/coronavirus/2019-ncov °World Health Organization (WHO) °· Information and news updates: www.who.int/emergencies/diseases/novel-coronavirus-2019 °· Coronavirus health topic: www.who.int/health-topics/coronavirus °· Questions and answers on COVID-19: www.who.int/news-room/q-a-detail/q-a-coronaviruses °· Global tracker: who.sprinklr.com °American Academy of Pediatrics (AAP) °· Information for families: www.healthychildren.org/English/health-issues/conditions/chest-lungs/Pages/2019-Novel-Coronavirus.aspx °The coronavirus situation is changing rapidly. Check your local health authority website or the CDC and WHO websites for updates and news. °When should I contact a health care provider? °· Contact your health care provider if you have symptoms of an  infection, such as fever or cough, and you: °? Have been near anyone who is known to have coronavirus disease. °? Have come into contact with a person who is suspected to have coronavirus disease. °? Have traveled to an area where there is an outbreak of COVID-19. °When should I get emergency medical care? °· Get help right away by calling your local emergency services (911 in the [place] if you have: °? Trouble breathing. °? Pain or pressure in your chest. °? Confusion. °? Blue-tinged lips and fingernails. °? Difficulty waking from sleep. °? Symptoms that get worse. °  Let the emergency medical personnel know if you think you have coronavirus disease. °Summary °· A new respiratory virus is spreading from person to person and causing COVID-19 (coronavirus disease). °· The virus that causes COVID-19 appears to spread easily. It spreads from one person to another through droplets from breathing, speaking, singing, coughing, or sneezing. °· Older adults and those with chronic diseases are at higher risk of disease. If you are at higher risk for complications, take extra precautions. °· There is currently no vaccine to prevent coronavirus disease. There are no medicines, such as antibiotics or antivirals, to treat the virus. °· You can protect yourself and your family by washing your hands often, avoiding touching your face, and covering your coughs and sneezes. °This information is not intended to replace advice given to you by your health care provider. Make sure you discuss any questions you have with your health care provider. °Document Revised: 11/16/2018 Document Reviewed: 05/15/2018 °Elsevier Patient Education © 2020 [employer] ° °3 Key Steps to Take While Waiting for Your COVID-19 Test Result °To help stop the spread of COVID-19, take these 3 key steps NOW while waiting for your test results: °1. Stay home and monitor your health. °Stay home and monitor your health to help protect your friends, family, and others  from possibly getting COVID-19 from you. °Stay home and away from others: °· If possible, stay away from others, especially people who are at higher risk for getting very sick from COVID-19, such as older adults and people with other medical conditions. °· If you have been in contact with someone with COVID-19, stay home and away from others for 14 days after your last contact with that person. °· If you have a fever, cough or other symptoms of COVID-19, stay home and away from others (except to get medical care). °Monitor your health: °· Watch for fever, cough, shortness of breath, or other symptoms of COVID-19. Remember, symptoms may appear 2-14 days after exposure to COVID-19 and can include: °? Fever or chills °? Cough °? Shortness of breath or difficulty breathing °? Tiredness °? Muscle or body aches °? Headache °? New loss of taste or smell °? Sore throat °? Congestion or runny nose °? Nausea or vomiting °? Diarrhea °2. Think about the people you have recently been around. °If you are diagnosed with COVID-19, a public health worker may call you to check on your health, discuss who you have been around, and ask where you spent time while you may have been able to spread COVID-19 to others. While you wait for your COVID-19 test result, think about everyone you have been around recently. This will be important information to give health workers if your test is positive.  °Complete the information on the back of this page to help you remember everyone you have been around. °3. Answer the phone call from the health department. °If a public health worker calls you, answer the call to help slow the spread of COVID-19 in your community. °· Discussions with health department staff are confidential. This means that your personal and medical information will be kept private and only shared with those who may need to know, like your health care provider. °· Your name will not be shared with those you came in contact with.  The health department will only notify people you were in close contact with (within 6 feet for more than 15 minutes) that they might have been exposed to COVID-19. °Think about the people you   have recently been around °If you test positive and are diagnosed with COVID-19, someone from the health department may call to check-in on your health, discuss who you have been around, and ask where you spent time while you may have been able to spread COVID-19 to others. This form can help you think about people you have recently been around so you will be ready if a public health worker calls you. °Things to think about. Have you: °· Gone to work or school? °· Gotten together with others (eaten out at a restaurant, gone out for drinks, exercised with others or gone to a gym, had friends or family over to your house, volunteered, gone to a party, pool, or park)? °· Gone to a store in person (e.g., grocery store, mall)? °· Gone to in-person appointments (e.g., salon, barber, doctor's or dentist's office)? °· Ridden in a car with others (e.g., Uber or Ly[employer]) or took public transportation? °· Been inside a church, synagogue, mosque or other places of worship? °Who lives with you? °· ______________________________________________________________________ °· ______________________________________________________________________ °· ______________________________________________________________________ °· ______________________________________________________________________ °Who have you been around (within 6 feet for more than 15 minutes) in the last 10 days? (You may have more people to list than the space provided. If so, write on the front of this sheet or a separate piece of paper.) °Name ______________________________________________ °· Phone number ____________________________________ °· Date you last saw them _____________________________ °· Where you last saw them ________________________________________________ °Name  ______________________________________________ °· Phone number ____________________________________ °· Date you last saw them _____________________________ °· Where you last saw them ________________________________________________ °Name ______________________________________________ °· Phone number ____________________________________ °· Date you last saw them _____________________________ °· Where you last saw them ________________________________________________ °Name ______________________________________________ °· Phone number ____________________________________ °· Date you last saw them _____________________________ °· Where you last saw them ________________________________________________ °Name ______________________________________________ °· Phone number ____________________________________ °· Date you last saw them _____________________________ °· Where you last saw them ________________________________________________ °What have you done in the last 10 days with other people? °Activity _____________________________________________ °· Location _________________________________________ °· Date ____________________________________________ °Activity _____________________________________________ °· Location _________________________________________ °· Date ____________________________________________ °Activity _____________________________________________ °· Location _________________________________________ °· Date ____________________________________________ °Activity _____________________________________________ °· Location _________________________________________ °· Date ____________________________________________ °Activity _____________________________________________ °· Location _________________________________________ °· Date ____________________________________________ °cdc.gov/coronavirus °08/27/2018 °This information is not intended to replace advice given to you by your health care provider.  Make sure you discuss any questions you have with your health care provider. °Document Revised: 01/03/2019 Document Reviewed: 01/03/2019 °[employer]vier Patient Education © 2020 [employer] ° ° °COVID-19 °COVID-19 is a respiratory infection that is caused by a virus called severe acute respiratory syndrome coronavirus 2 (SARS-CoV-2). The disease is also known as coronavirus disease or novel coronavirus. In some people, the virus may not cause any symptoms. In others, it may cause a serious infection. The infection can get worse quickly and can lead to complications, such as: °· Pneumonia, or infection of the lungs. °· Acute respiratory distress syndrome or ARDS. This is a condition in which fluid build-up in the lungs prevents the lungs from filling with air and passing oxygen into the blood. °· Acute respiratory failure. This is a condition in which there is not enough oxygen passing from the lungs to the body or when carbon dioxide is not passing from the lungs out of the body. °· Sepsis or septic shock. This is a serious bodily reaction to an infection. °· Blood clotting problems. °· Secondary infections due to bacteria or fungus. °· Organ failure. This is when your body's organs   stop working. °The virus that causes COVID-19 is contagious. This means that it can spread from person to person through droplets from coughs and sneezes (respiratory secretions). °What are the causes? °This illness is caused by a virus. You may catch the virus by: °· Breathing in droplets from an infected person. Droplets can be spread by a person breathing, speaking, singing, coughing, or sneezing. °· Touching something, like a table or a doorknob, that was exposed to the virus (contaminated) and then touching your mouth, nose, or eyes. °What increases the risk? °Risk for infection °You are more likely to be infected with this virus if you: °· Are within 6 feet (2 meters) of a person with COVID-19. °· Provide care for or live with a person  who is infected with COVID-19. °· Spend time in crowded indoor spaces or live in shared housing. °Risk for serious illness °You are more likely to become seriously ill from the virus if you: °· Are 50 years of age or older. The higher your age, the more you are at risk for serious illness. °· Live in a nursing home or long-term care facility. °· Have cancer. °· Have a long-term (chronic) disease such as: °? Chronic lung disease, including chronic obstructive pulmonary disease or asthma. °? A long-term disease that lowers your body's ability to fight infection (immunocompromised). °? Heart disease, including heart failure, a condition in which the arteries that lead to the heart become narrow or blocked (coronary artery disease), a disease which makes the heart muscle thick, weak, or stiff (cardiomyopathy). °? Diabetes. °? Chronic kidney disease. °? Sickle cell disease, a condition in which red blood cells have an abnormal "sickle" shape. °? Liver disease. °· Are obese. °What are the signs or symptoms? °Symptoms of this condition can range from mild to severe. Symptoms may appear any time from 2 to 14 days after being exposed to the virus. They include: °· A fever or chills. °· A cough. °· Difficulty breathing. °· Headaches, body aches, or muscle aches. °· Runny or stuffy (congested) nose. °· A sore throat. °· New loss of taste or smell. °Some people may also have stomach problems, such as nausea, vomiting, or diarrhea. °Other people may not have any symptoms of COVID-19. °How is this diagnosed? °This condition may be diagnosed based on: °· Your signs and symptoms, especially if: °? You live in an area with a COVID-19 outbreak. °? You recently traveled to or from an area where the virus is common. °? You provide care for or live with a person who was diagnosed with COVID-19. °? You were exposed to a person who was diagnosed with COVID-19. °· A physical exam. °· Lab tests, which may include: °? Taking a sample of  fluid from the back of your nose and throat (nasopharyngeal fluid), your nose, or your throat using a swab. °? A sample of mucus from your lungs (sputum). °? Blood tests. °· Imaging tests, which may include, X-rays, CT scan, or ultrasound. °How is this treated? °At present, there is no medicine to treat COVID-19. Medicines that treat other diseases are being used on a trial basis to see if they are effective against COVID-19. °Your health care provider will talk with you about ways to treat your symptoms. For most people, the infection is mild and can be managed at home with rest, fluids, and over-the-counter medicines. °Treatment for a serious infection usually takes places in a hospital intensive care unit (ICU). It may include one or more of   the following treatments. These treatments are given until your symptoms improve. °· Receiving fluids and medicines through an IV. °· Supplemental oxygen. Extra oxygen is given through a tube in the nose, a face mask, or a hood. °· Positioning you to lie on your stomach (prone position). This makes it easier for oxygen to get into the lungs. °· Continuous positive airway pressure (CPAP) or bi-level positive airway pressure (BPAP) machine. This treatment uses mild air pressure to keep the airways open. A tube that is connected to a motor delivers oxygen to the body. °· Ventilator. This treatment moves air into and out of the lungs by using a tube that is placed in your windpipe. °· Tracheostomy. This is a procedure to create a hole in the neck so that a breathing tube can be inserted. °· Extracorporeal membrane oxygenation (ECMO). This procedure gives the lungs a chance to recover by taking over the functions of the heart and lungs. It supplies oxygen to the body and removes carbon dioxide. °Follow these instructions at home: °Lifestyle °· If you are sick, stay home except to get medical care. Your health care provider will tell you how long to stay home. Call your health care  provider before you go for medical care. °· Rest at home as told by your health care provider. °· Do not use any products that contain nicotine or tobacco, such as cigarettes, e-cigarettes, and chewing tobacco. If you need help quitting, ask your health care provider. °· Return to your normal activities as told by your health care provider. Ask your health care provider what activities are safe for you. °General instructions °· Take over-the-counter and prescription medicines only as told by your health care provider. °· Drink enough fluid to keep your urine pale yellow. °· Keep all follow-up visits as told by your health care provider. This is important. °How is this prevented? ° °There is no vaccine to help prevent COVID-19 infection. However, there are steps you can take to protect yourself and others from this virus. °To protect yourself:  °· Do not travel to areas where COVID-19 is a risk. The areas where COVID-19 is reported change often. To identify high-risk areas and travel restrictions, check the CDC travel website: wwwnc.cdc.gov/travel/notices °· If you live in, or must travel to, an area where COVID-19 is a risk, take precautions to avoid infection. °? Stay away from people who are sick. °? Wash your hands often with soap and water for 20 seconds. If soap and water are not available, use an alcohol-based hand sanitizer. °? Avoid touching your mouth, face, eyes, or nose. °? Avoid going out in public, follow guidance from your state and local health authorities. °? If you must go out in public, wear a cloth face covering or face mask. Make sure your mask covers your nose and mouth. °? Avoid crowded indoor spaces. Stay at least 6 feet (2 meters) away from others. °? Disinfect objects and surfaces that are frequently touched every day. This may include: °§ Counters and tables. °§ Doorknobs and light switches. °§ Sinks and faucets. °§ Electronics, such as phones, remote controls, keyboards, computers, and  tablets. °To protect others: °If you have symptoms of COVID-19, take steps to prevent the virus from spreading to others. °· If you think you have a COVID-19 infection, contact your health care provider right away. Tell your health care team that you think you may have a COVID-19 infection. °· Stay home. Leave your house only to seek medical care.   Do not use public transport. °· Do not travel while you are sick. °· Wash your hands often with soap and water for 20 seconds. If soap and water are not available, use alcohol-based hand sanitizer. °· Stay away from other members of your household. Let healthy household members care for children and pets, if possible. If you have to care for children or pets, wash your hands often and wear a mask. If possible, stay in your own room, separate from others. Use a different bathroom. °· Make sure that all people in your household wash their hands well and often. °· Cough or sneeze into a tissue or your sleeve or elbow. Do not cough or sneeze into your hand or into the air. °· Wear a cloth face covering or face mask. Make sure your mask covers your nose and mouth. °Where to find more information °· Centers for Disease Control and Prevention: www.cdc.gov/coronavirus/2019-ncov/index.html °· World Health Organization: www.who.int/health-topics/coronavirus °Contact a health care provider if: °· You live in or have traveled to an area where COVID-19 is a risk and you have symptoms of the infection. °· You have had contact with someone who has COVID-19 and you have symptoms of the infection. °Get help right away if: °· You have trouble breathing. °· You have pain or pressure in your chest. °· You have confusion. °· You have bluish lips and fingernails. °· You have difficulty waking from sleep. °· You have symptoms that get worse. °These symptoms may represent a serious problem that is an emergency. Do not wait to see if the symptoms will go away. Get medical help right away. Call  your local emergency services (911 in the [place] Do not drive yourself to the hospital. Let the emergency medical personnel know if you think you have COVID-19. °Summary °· COVID-19 is a respiratory infection that is caused by a virus. It is also known as coronavirus disease or novel coronavirus. It can cause serious infections, such as pneumonia, acute respiratory distress syndrome, acute respiratory failure, or sepsis. °· The virus that causes COVID-19 is contagious. This means that it can spread from person to person through droplets from breathing, speaking, singing, coughing, or sneezing. °· You are more likely to develop a serious illness if you are 50 years of age or older, have a weak immune system, live in a nursing home, or have chronic disease. °· There is no medicine to treat COVID-19. Your health care provider will talk with you about ways to treat your symptoms. °· Take steps to protect yourself and others from infection. Wash your hands often and disinfect objects and surfaces that are frequently touched every day. Stay away from people who are sick and wear a mask if you are sick. °This information is not intended to replace advice given to you by your health care provider. Make sure you discuss any questions you have with your health care provider. °Document Revised: 11/16/2018 Document Reviewed: 02/22/2018 °[employer]vier Patient Education © 2020 [employer] ° °COVID-19: How to Protect Yourself and Others °Know how it spreads °· There is currently no vaccine to prevent coronavirus disease 2019 (COVID-19). °· The best way to prevent illness is to avoid being exposed to this virus. °· The virus is thought to spread mainly from person-to-person. °? Between people who are in close contact with one another (within about 6 feet). °? Through respiratory droplets produced when an infected person coughs, sneezes or talks. °? These droplets can land in the mouths or noses of people   who are nearby or possibly be  inhaled into the lungs. °? COVID-19 may be spread by people who are not showing symptoms. °Everyone should °Clean your hands often °· Wash your hands often with soap and water for at least 20 seconds especially after you have been in a public place, or after blowing your nose, coughing, or sneezing. °· If soap and water are not readily available, use a hand sanitizer that contains at least 60% alcohol. Cover all surfaces of your hands and rub them together until they feel dry. °· Avoid touching your eyes, nose, and mouth with unwashed hands. °Avoid close contact °· Limit contact with others as much as possible. °· Avoid close contact with people who are sick. °· Put distance between yourself and other people. °? Remember that some people without symptoms may be able to spread virus. °? This is especially important for people who are at higher risk of getting very sick.www.cdc.gov/coronavirus/2019-ncov/need-extra-precautions/people-at-higher-risk.html °Cover your mouth and nose with a mask when around others °· You could spread COVID-19 to others even if you do not feel sick. °· Everyone should wear a mask in public settings and when around people not living in their household, especially when social distancing is difficult to maintain. °? Masks should not be placed on young children under age 2, anyone who has trouble breathing, or is unconscious, incapacitated or otherwise unable to remove the mask without assistance. °· The mask is meant to protect other people in case you are infected. °· Do NOT use a facemask meant for a healthcare worker. °· Continue to keep about 6 feet between yourself and others. The mask is not a substitute for social distancing. °Cover coughs and sneezes °· Always cover your mouth and nose with a tissue when you cough or sneeze or use the inside of your elbow. °· Throw used tissues in the trash. °· Immediately wash your hands with soap and water for at least 20 seconds. If soap and water  are not readily available, clean your hands with a hand sanitizer that contains at least 60% alcohol. °Clean and disinfect °· Clean AND disinfect frequently touched surfaces daily. This includes tables, doorknobs, light switches, countertops, handles, desks, phones, keyboards, toilets, faucets, and sinks. www.cdc.gov/coronavirus/2019-ncov/prevent-getting-sick/disinfecting-your-home.html °· If surfaces are dirty, clean them: Use detergent or soap and water prior to disinfection. °· Then, use a household disinfectant. You can see a list of EPA-registered household disinfectants here. °cdc.gov/coronavirus °10/03/2018 °This information is not intended to replace advice given to you by your health care provider. Make sure you discuss any questions you have with your health care provider. °Document Revised: 10/11/2018 Document Reviewed: 08/09/2018 °[employer] Patient Education © 2020 [employer] ° °

## 2020-01-14 NOTE — Discharge Summary (Signed)
Physician Discharge Summary   Corey [name]  male DOB: 07-28-1955  BJY:[id number]  PCP: Volney American, PA-C  Admit date: 01/11/2020 Discharge date: 01/14/2020  Admitted From: home Disposition:  home CODE STATUS: Full code  Discharge Instructions    Discharge instructions   Complete by: As directed    You have COVID infection but improving, and no longer need extra oxygen even with walking.  You can go home to recover.    Please take 7 more days of steroid with Decadron as directed to help reduce inflammation.   Dr. Enzo Bi [place] Hospital Course:  For full details, please see H&P, progress notes, consult notes and ancillary notes.  Briefly,  Corey L Johnsonis a 64 y.o.malewith medical history significant ofhypertension, hyperlipidemia, GERD, diabetes, asthma, CAD s/p bypass surgery who presented to the emergency department with complaints of generalized body ache,Headache,vomiting,diarrhea,decreased appetite,cough and progressive shortness of breath and chest tightness for 3-4 days.     Acute hypoxic respiratory failure secondary to Covid pneumonia O2 saturation was 88% on room air. He was placed on 3 L supplemental oxygen.  Prior to discharge, pt was sating 90% on room air with ambulating.  COVID PNA CT chest showed Diffuse ground-glass airspace opacities.  Pt was started on Remdesivir and decadron on admission.  CRP 21.5 on presentation, so steroid was increased to solumedrol 40 mg q8h.  CRP trended down to 7.3 prior to discharge, and pt was discharged on oral decadron 6 mg BID for 7 more days.  Elevated troponin 2/2 demand ischemia Troponin slightly elevated at 21, this could be due to demand ischemia, patient denied any chest pain.  CAD: Continued home aspirin, Imdur, metoprolol, losartan,Crestor.  Hypertension Continued home losartan, metoprolol,Lasix.  Diabetes mellitus: Pt received Lantus 50u nightly and SSI TID while  inpatient.  Pt was discharged on home diabetic regimen.  Asthma: Continued albuterol inhalers as needed.  Major depression Continued home Celexa, Wellbutrin.  GERD: Continued home PPI   Discharge Diagnoses:  Principal Problem:   Pneumonia due to COVID-19 virus Active Problems:   Type 2 diabetes mellitus with hyperglycemia (HCC)   Asthma   RLS (restless legs syndrome)   Essential hypertension   Major depression   Tremor   Hypogonadism male   GERD (gastroesophageal reflux disease)    Discharge Instructions:  Allergies as of 01/14/2020   No Known Allergies     Medication List    TAKE these medications   Accu-Chek FastClix Lancet Kit 1 Units by Does not apply route 2 (two) times daily.   Accu-Chek FastClix Lancets Misc USE TO CHECK BLOOD SUGAR   Accu-Chek Guide test strip Generic drug: glucose blood USE TO CHECK BLOOD SUGAR TID   Accu-Chek Guide w/Device Kit U UTD   acetaminophen 500 MG tablet Commonly known as: TYLENOL Take 1,000 mg by mouth every 6 (six) hours as needed for moderate pain or headache.   albuterol 108 (90 Base) MCG/ACT inhaler Commonly known as: VENTOLIN HFA INHALE 2 PUFFS INTO THE LUNGS EVERY 6 HOURS AS NEEDED FOR WHEEZING OR SHORTNESS OF BREATH What changed: See the new instructions.   amitriptyline 50 MG tablet Commonly known as: ELAVIL TAKE 1 TO 2 TABLETS(50 TO 100 MG) BY MOUTH AT BEDTIME What changed: See the new instructions.   aspirin EC 81 MG tablet Take 81 mg by mouth daily.   azelastine 0.1 % nasal spray Commonly known as: ASTELIN USE 2 SPRAYS IN EACH NOSTRIL TWICE  DAILY   B-D UF III MINI PEN NEEDLES 31G X 5 MM Misc Generic drug: Insulin Pen Needle USE TWICE DAILY   buPROPion 150 MG 24 hr tablet Commonly known as: WELLBUTRIN XL TAKE 1 TABLET(150 MG) BY MOUTH DAILY What changed: See the new instructions.   cetirizine 10 MG tablet Commonly known as: ZYRTEC TAKE 1 TABLET BY MOUTH DAILY   citalopram 10 MG  tablet Commonly known as: CELEXA TAKE 1 TABLET BY MOUTH  DAILY   clopidogrel 75 MG tablet Commonly known as: PLAVIX Take 75 mg by mouth daily.   dexamethasone 6 MG tablet Commonly known as: DECADRON Take 1 tablet (6 mg total) by mouth 2 (two) times daily for 7 days. To help reduce inflammation.   diclofenac sodium 1 % Gel Commonly known as: VOLTAREN Apply 1 application topically 4 (four) times daily as needed (pain).   famotidine 40 MG tablet Commonly known as: PEPCID Take 40 mg by mouth daily.   furosemide 20 MG tablet Commonly known as: LASIX TAKE 1 TABLET(20 MG) BY MOUTH TWICE DAILY What changed: See the new instructions.   guaiFENesin-dextromethorphan 100-10 MG/5ML syrup Commonly known as: ROBITUSSIN DM Take 10 mLs by mouth every 4 (four) hours as needed for cough.   isosorbide mononitrate 60 MG 24 hr tablet Commonly known as: IMDUR Take 60 mg by mouth daily.   Jardiance 25 MG Tabs tablet Generic drug: empagliflozin Take 25 mg by mouth daily.   Lantus SoloStar 100 UNIT/ML Solostar Pen Generic drug: insulin glargine INJECT 75 UNITS [name] What changed:   how much to take  how to take this  when to take this  additional instructions   losartan 50 MG tablet Commonly known as: COZAAR Take 50 mg by mouth daily.   Luer Lock Safety Syringes 21G X 1-1/2" 3 ML Misc Generic drug: SYRINGE-NEEDLE (DISP) 3 ML Use as directed for testosterone administration   magnesium gluconate 500 MG tablet Commonly known as: MAGONATE Take 500 mg by mouth daily.   meloxicam 7.5 MG tablet Commonly known as: MOBIC Take 1 tablet (7.5 mg total) by mouth daily as needed. What changed: See the new instructions.   metFORMIN 1000 MG tablet Commonly known as: GLUCOPHAGE TAKE 1 TABLET BY MOUTH  TWICE DAILY What changed: when to take this   metoprolol succinate 25 MG 24 hr tablet Commonly known as: TOPROL-XL Take 25 mg by mouth daily.   montelukast 10 MG tablet Commonly known  as: SINGULAIR TAKE 1 TABLET(10 MG) BY MOUTH DAILY What changed: See the new instructions.   NEEDLE (DISP) 18 G 18G X 1" Misc Use as directed   pantoprazole 40 MG tablet Commonly known as: PROTONIX Take 1 tablet (40 mg total) by mouth 2 (two) times daily before a meal.   pregabalin 150 MG capsule Commonly known as: Lyrica Take 1 capsule (150 mg total) by mouth 2 (two) times daily.   primidone 50 MG tablet Commonly known as: MYSOLINE TAKE 2 TABLETS(100 MG) BY MOUTH DAILY What changed: See the new instructions.   rosuvastatin 20 MG tablet Commonly known as: CRESTOR TAKE 1 TABLET BY MOUTH  DAILY   Semaglutide (1 MG/DOSE) 4 MG/3ML Sopn Inject 1 mg into the skin once a week.   testosterone cypionate 200 MG/ML injection Commonly known as: DEPOTESTOSTERONE CYPIONATE INJECT 1 ML IN THE MUSCLE EVERY 14 DAYS What changed: See the new instructions.   Vitamin D3 125 MCG (5000 UT) Tabs Take 5,000 Units by mouth daily.   Wixela Inhub  250-50 MCG/DOSE Aepb Generic drug: Fluticasone-Salmeterol Inhale 1 puff into the lungs 2 (two) times daily.        Follow-up Information    Volney American, PA-C. Schedule an appointment as soon as possible for a visit in 1 week(s).   Specialty: Family Medicine Contact information: [place] [address] [phone]               No Known Allergies   The results of significant diagnostics from this hospitalization (including imaging, microbiology, ancillary and laboratory) are listed below for reference.   Consultations:   Procedures/Studies: DG Chest 1 View  Result Date: 01/11/2020 CLINICAL DATA:  Shortness of breath. EXAM: CHEST  1 VIEW COMPARISON:  April 05, 2018. FINDINGS: The heart size and mediastinal contours are within normal limits. No pneumothorax or pleural effusion is noted. Right midlung and basilar opacities are noted concerning for pneumonia. Minimal left basilar subsegmental atelectasis is noted. The  visualized skeletal structures are unremarkable. IMPRESSION: Right midlung and basilar opacities are noted concerning for pneumonia. Minimal left basilar subsegmental atelectasis. Electronically Signed   By: Marijo Conception M.D.   On: 01/11/2020 16:49   CT Angio Chest PE W and/or Wo Contrast  Result Date: 01/11/2020 CLINICAL DATA:  Headache and vomiting for 2 days, short of breath EXAM: CT ANGIOGRAPHY CHEST WITH CONTRAST TECHNIQUE: Multidetector CT imaging of the chest was performed using the standard protocol during bolus administration of intravenous contrast. Multiplanar CT image reconstructions and MIPs were obtained to evaluate the vascular anatomy. CONTRAST:  159m OMNIPAQUE IOHEXOL 350 MG/ML SOLN COMPARISON:  Chest x-ray 01/11/2020 FINDINGS: Cardiovascular: Satisfactory opacification of the pulmonary arteries to the segmental level. No evidence of pulmonary embolism. The main pulmonary artery is normal in caliber. Normal heart size. No significant pericardial effusion. The thoracic aorta is normal in caliber. Mild atherosclerotic plaque of the thoracic aorta. Moderate left anterior descending coronary artery calcifications and mild right and left circumflex coronary artery calcifications. Mediastinum/Nodes: Bilateral 1 cm hilar lymph nodes. No enlarged mediastinal or axillary lymph nodes. Thyroid gland, trachea, and esophagus demonstrate no significant findings. Lungs/Pleura: Diffuse ground-glass airspace opacities. No pleural effusion. No pneumothorax. Upper Abdomen: A 2.1 cm hepatic lobe fluid density lesion likely represents a hepatic cyst (4:70). No acute abnormality. Musculoskeletal: No chest wall abnormality. No suspicious lytic or blastic osseous lesions. Cortical irregularity of the right lateral tenth rib likely represents an old healed fracture. No acute displaced fracture. Multilevel degenerative changes of the spine. Review of the MIP images confirms the above findings. IMPRESSION: 1.  Multifocal pneumonia with findings suggestive of COVID-19 infection. Followup PA and lateral chest X-ray is recommended in 3-4 weeks following therapy to ensure resolution and exclude underlying malignancy. 2. Bilateral hilar lymphadenopathy likely reactive in etiology. 3. Mild to moderate three-vessel coronary artery calcifications. 4.  Aortic Atherosclerosis (ICD10-I70.0). Electronically Signed   By: MIven FinnM.D.   On: 01/11/2020 18:40      Labs: BNP (last 3 results) Recent Labs    01/11/20 1131  BNP 433.8  Basic Metabolic Panel: Recent Labs  Lab 01/11/20 1131 01/11/20 1945 01/12/20 0324 01/13/20 0337 01/14/20 0448  NA 140  --  140 142 141  K 4.4  --  4.9 4.2 4.3  CL 103  --  106 106 103  CO2 23  --  _0 GLUCOSE 146*  --  132* 161* 212*  BUN 15  --  21 28* 23  CREATININE 1.02 1.00  0.92 1.06 1.07  CALCIUM 8.8*  --  8.4* 8.4* 9.3  MG 2.2  --   --  2.4 2.2   Liver Function Tests: Recent Labs  Lab 01/11/20 1131 01/12/20 0324  AST 29 29  ALT 23 23  ALKPHOS 73 72  BILITOT 1.3* 1.3*  PROT 7.7 7.5  ALBUMIN 3.4* 3.2*   Recent Labs  Lab 01/11/20 1131  LIPASE 18   No results for input(s): AMMONIA in the last 168 hours. CBC: Recent Labs  Lab 01/11/20 1131 01/11/20 1945 01/12/20 0324 01/13/20 0337 01/14/20 0448  WBC 6.1 5.6 5.8 10.3 9.5  NEUTROABS  --   --  5.2  --   --   HGB 17.3* 15.9 16.6 15.8 17.7*  HCT 52.8* 49.6 51.7 48.9 53.7*  MCV 94.3 95.2 95.4 94.8 92.6  PLT 173 178 187 223 258   Cardiac Enzymes: No results for input(s): CKTOTAL, CKMB, CKMBINDEX, TROPONINI in the last 168 hours. BNP: Invalid input(s): POCBNP CBG: Recent Labs  Lab 01/13/20 1154 01/13/20 1639 01/13/20 2129 01/14/20 0815 01/14/20 1107  GLUCAP 206* 153* 293* 220* 292*   D-Dimer No results for input(s): DDIMER in the last 72 hours. Hgb A1c Recent Labs    01/11/20 1945  HGBA1C 8.3*   Lipid Profile No results for input(s): CHOL, HDL, LDLCALC, TRIG, CHOLHDL,  LDLDIRECT in the last 72 hours. Thyroid function studies No results for input(s): TSH, T4TOTAL, T3FREE, THYROIDAB in the last 72 hours.  Invalid input(s): FREET3 Anemia work up Recent Labs    01/12/20 0324  FERRITIN 195   Urinalysis    Component Value Date/Time   COLORURINE YELLOW (A) 01/11/2020 2100   APPEARANCEUR CLEAR (A) 01/11/2020 2100   APPEARANCEUR Clear 12/24/2018 1050   LABSPEC >1.046 (H) 01/11/2020 2100   PHURINE 6.0 01/11/2020 2100   GLUCOSEU >=500 (A) 01/11/2020 2100   HGBUR NEGATIVE 01/11/2020 2100   BILIRUBINUR NEGATIVE 01/11/2020 2100   BILIRUBINUR Negative 12/24/2018 1050   KETONESUR 80 (A) 01/11/2020 2100   PROTEINUR 100 (A) 01/11/2020 2100   NITRITE NEGATIVE 01/11/2020 2100   LEUKOCYTESUR NEGATIVE 01/11/2020 2100   Sepsis Labs Invalid input(s): PROCALCITONIN,  WBC,  LACTICIDVEN Microbiology Recent Results (from the past 240 hour(s))  Resp Panel by RT-PCR (Flu A&B, Covid) Nasopharyngeal Swab     Status: Abnormal   Collection Time: 01/11/20  4:06 PM   Specimen: Nasopharyngeal Swab; Nasopharyngeal(NP) swabs in vial transport medium  Result Value Ref Range Status   SARS Coronavirus 2 by RT PCR POSITIVE (A) NEGATIVE Final    Comment: RESULT CALLED TO, READ BACK BY AND VERIFIED WITH: JANE MARTIN 01/11/20 AT 1718 BY ACR (NOTE) SARS-CoV-2 target nucleic acids are DETECTED.  The SARS-CoV-2 RNA is generally detectable in upper respiratory specimens during the acute phase of infection. Positive results are indicative of the presence of the identified virus, but do not rule out bacterial infection or co-infection with other pathogens not detected by the test. Clinical correlation with patient history and other diagnostic information is necessary to determine patient infection status. The expected result is Negative.  Fact Sheet for Patients: EntrepreneurPulse.com.au  Fact Sheet for Healthcare  Providers: IncredibleEmployment.be  This test is not yet approved or cleared by the [place] FDA and  has been authorized for detection and/or diagnosis of SARS-CoV-2 by FDA under an Emergency Use Authorization (EUA).  This EUA will remain in effect (meaning this test can  be used) for the duration of  the COVID-19 declaration under Section 564(b)(1) of  the Act, 21 U.S.C. section 360bbb-3(b)(1), unless the authorization is terminated or revoked sooner.     Influenza A by PCR NEGATIVE NEGATIVE Final   Influenza B by PCR NEGATIVE NEGATIVE Final    Comment: (NOTE) The Xpert Xpress SARS-CoV-2/FLU/RSV plus assay is intended as an aid in the diagnosis of influenza from Nasopharyngeal swab specimens and should not be used as a sole basis for treatment. Nasal washings and aspirates are unacceptable for Xpert Xpress SARS-CoV-2/FLU/RSV testing.  Fact Sheet for Patients: EntrepreneurPulse.com.au  Fact Sheet for Healthcare Providers: IncredibleEmployment.be  This test is not yet approved or cleared by the [place] FDA and has been authorized for detection and/or diagnosis of SARS-CoV-2 by FDA under an Emergency Use Authorization (EUA). This EUA will remain in effect (meaning this test can be used) for the duration of the COVID-19 declaration under Section 564(b)(1) of the Act, 21 U.S.C. section 360bbb-3(b)(1), unless the authorization is terminated or revoked.  Performed at [facility], [address] [place], [place] [address]      Total time spend on discharging this patient, including the last patient exam, discussing the hospital stay, instructions for ongoing care as it relates to all pertinent caregivers, as well as preparing the medical discharge records, prescriptions, and/or referrals as applicable, is 35 minutes.    Enzo Bi, MD  [facility] 01/14/2020, 1:55 PM

## 2020-01-14 NOTE — Progress Notes (Signed)
SATURATION QUALIFICATIONS: (This note is used to comply with regulatory documentation for home oxygen)  Patient Saturations on Room Air at Rest =94%  Patient Saturations on Room Air while Ambulating = 90%  Patient Saturations on0 Liters of oxygen while Ambulating =90%  Please briefly explain why patient needs home oxygen: 

## 2020-01-14 NOTE — Progress Notes (Signed)
Inpatient Diabetes Program Recommendations  AACE/ADA: New Consensus Statement on Inpatient Glycemic Control   Target Ranges:  Prepandial:   less than 140 mg/dL      Peak postprandial:   less than 180 mg/dL (1-2 hours)      Critically ill patients:  140 - 180 mg/dL   Results for Corey [name], Corey [name] (MRN [id number]) as of 01/14/2020 09:41  Ref. Range 01/13/2020 08:10 01/13/2020 11:54 01/13/2020 16:39 01/13/2020 21:29 01/14/2020 08:15  Glucose-Capillary Latest Ref Range: 70 - 99 mg/dL 518 (H) 841 (H) 660 (H) 293 (H) 220 (H)   Review of Glycemic Control  Diabetes history: DM2 Outpatient Diabetes medications: Lantus 85 units QHS, Metformin 1000 mg BID, Jardiance 25 mg daily, Ozempic 1 mg Qweek Current orders for Inpatient glycemic control: Lantus 50 units QHS, Novolog 0-15 units TID with meals; Solumedrol 40 mg Q8H  Inpatient Diabetes Program Recommendations:    Insulin: Please consider ordering Novolog 0-5 units QHS for bedtime correction.  If steroids are continued, please consider ordering Novolog 3 units TID with meals for meal coverage if patient eats at least 50% of meals.   Thanks, Orlando Penner, RN, MSN, CDE Diabetes Coordinator Inpatient Diabetes Program [phone] (Team Pager from 8am to 5pm)

## 2020-01-14 NOTE — Care Management Important Message (Signed)
Important Message  Patient Details  Name: Corey [name] MRN: [id number] Date of Birth: 03-Apr-1955   Medicare Important Message Given:  N/A - LOS <3 / Initial given by admissions  Initial Medicare IM reviewed with patient via room phone by Jennye Moccasin, Patient Access Associate on 01/13/2020 at 10:32am.   Johnell Comings 01/14/2020, 8:33 AM

## 2020-01-15 ENCOUNTER — Telehealth: Payer: Self-pay

## 2020-01-15 NOTE — Telephone Encounter (Signed)
Transition Care Management Unsuccessful Follow-up Telephone Call  Date of discharge and from where:  01/14/2020 [facility]  Attempts:  1st Attempt  Reason for unsuccessful TCM follow-up call:  Left voice message

## 2020-01-16 ENCOUNTER — Telehealth: Payer: Self-pay

## 2020-01-16 NOTE — Telephone Encounter (Signed)
Transition Care Management Unsuccessful Follow-up Telephone Call  Date of discharge and from where:  01/14/2020 [facility]  Attempts:  2nd Attempt  Reason for unsuccessful TCM follow-up call:  Left voice message

## 2020-01-16 NOTE — Telephone Encounter (Signed)
Transition Care Management Follow-up Telephone Call  Date of discharge and from where: 01/14/2020 [facility]  How have you been since you were released from the hospital? ok  Any questions or concerns? No  Items Reviewed:  Did the pt receive and understand the discharge instructions provided? Yes   Medications obtained and verified? Yes   Other? No   Any new allergies since your discharge? No   Dietary orders reviewed? Yes  Do you have support at home? Yes   Home Care and Equipment/Supplies: Were home health services ordered? not applicable If so, what is the name of the agency? n/a  Has the agency set up a time to come to the patient's home? not applicable Were any new equipment or medical supplies ordered?  No What is the name of the medical supply agency? n/a Were you able to get the supplies/equipment? not applicable Do you have any questions related to the use of the equipment or supplies? No  Functional Questionnaire: (I = Independent and D = Dependent) ADLs: I  Bathing/Dressing- I  Meal Prep- I  Eating- I  Maintaining continence- I  Transferring/Ambulation- I  Managing Meds- I  Follow up appointments reviewed:   PCP Hospital f/u appt confirmed? No Office to call with follow up appointment  Are transportation arrangements needed? No   If their condition worsens, is the pt aware to call PCP or go to the Emergency Dept.? Yes  Was the patient provided with contact information for the PCP's office or ED? Yes  Was to pt encouraged to call back with questions or concerns? Yes

## 2020-01-21 ENCOUNTER — Other Ambulatory Visit: Payer: Self-pay | Admitting: Family Medicine

## 2020-01-21 ENCOUNTER — Inpatient Hospital Stay: Payer: 59 | Admitting: Family Medicine

## 2020-01-21 NOTE — Telephone Encounter (Signed)
Requested medication (s) are due for refill today:   Yes  Requested medication (s) are on the active medication list:   Yes  Future visit scheduled:   Yes today at 4:00 with Dr. Linwood Dibbles.   Address during OV?   Last ordered: 5 months ago  Returned since has OV today at 4:00   Requested Prescriptions  Pending Prescriptions Disp Refills   cetirizine (ZYRTEC) 10 MG tablet [Pharmacy Med Name: CETIRIZINE 10MG  TABLETS] 90 tablet 1    Sig: TAKE 1 TABLET BY MOUTH DAILY      Ear, Nose, and Throat:  Antihistamines Passed - 01/21/2020 10:03 AM      Passed - Valid encounter within last 12 months    Recent Outpatient Visits           7 months ago Essential hypertension   [facility], [place]   11 months ago Chronic bilateral low back pain with bilateral sciatica   [facility], [place]   1 year ago Type 2 diabetes mellitus with hyperglycemia, with long-term current use of insulin [facility])   [facility], [facility], [place] Hews   1 year ago Pain, dental   [facility], [place]   1 year ago Essential hypertension   [facility] [place], [place]       Future Appointments             Today Redge Gainer, DO [facility], [facility]   In 1 week Caro Laroche, Laural Benes, DO [facility], [facility]   In 10 months  [facility], [facility]

## 2020-01-21 NOTE — Progress Notes (Deleted)
    SUBJECTIVE:   CHIEF COMPLAINT / HPI:   Patient Active Problem List   Diagnosis Date Noted  . Pneumonia due to COVID-19 virus 01/11/2020  . Chronic bilateral low back pain with bilateral sciatica 12/06/2018  . Lumbar degenerative disc disease 12/06/2018  . Lumbar facet arthropathy 12/06/2018  . Sacroiliac joint pain 12/06/2018  . Chronic pain syndrome 12/06/2018  . Bilateral primary osteoarthritis of knee 12/06/2018  . GERD (gastroesophageal reflux disease) 11/12/2018  . Hyperlipidemia 02/03/2018  . Type 2 diabetes mellitus with hyperglycemia (HCC) 04/21/2017  . Asthma 04/21/2017  . RLS (restless legs syndrome) 04/21/2017  . Allergic rhinitis 04/21/2017  . Essential hypertension 04/21/2017  . Major depression 04/21/2017  . Tremor 04/21/2017  . Hypogonadism male 04/21/2017  . Headache 04/21/2017   ER FOLLOW UP - admitted 12/11-12/14 Time since discharge: 1 week Hospital/facility: [facility] Diagnosis: COVID-19 PNA, hypoxia, Txed with remdesivir, decadron Procedures/tests:  - CT chest - diffuse ground glass opacities Consultants: none New medications:  - oral decadron x7 days Discharge instructions:  F/u with PCP Status: {Blank multiple:19196::"better","worse","stable","fluctuating"}  Hypertension: - Medications: *** - Compliance: *** - Checking BP at home: *** - Denies any SOB, CP, vision changes, LE edema, medication SEs, or symptoms of hypotension - Diet: *** - Exercise: ***  Hypogonadism - follows with urology. DM - follows with Endo.  OBJECTIVE:   There were no vitals taken for this visit.  ***  ASSESSMENT/PLAN:   No problem-specific Assessment & Plan notes found for this encounter.     Caro Laroche, DO [facility]

## 2020-01-27 ENCOUNTER — Other Ambulatory Visit: Payer: Self-pay | Admitting: Family Medicine

## 2020-01-29 ENCOUNTER — Other Ambulatory Visit: Payer: Self-pay | Admitting: Family Medicine

## 2020-01-29 NOTE — Telephone Encounter (Signed)
Zyrtec last ordered 07/30/19 #90 tabs with 3 refills. Patient has refills on file at pharmacy. Refusing at this time.

## 2020-01-30 ENCOUNTER — Other Ambulatory Visit: Payer: Self-pay | Admitting: Family Medicine

## 2020-01-30 NOTE — Telephone Encounter (Signed)
Requested medication (s) are due for refill today: yes  Requested medication (s) are on the active medication list: yes  Last refill:  07/30/19  #90  1 refill  Future visit scheduled: yes  Notes to clinic:  med refused 2 times on med list last ordered by Roosvelt Maser. Has not seen another provider in office.  Scheduled to see Dr Laural Benes in future    Requested Prescriptions  Pending Prescriptions Disp Refills   cetirizine (ZYRTEC) 10 MG tablet [Pharmacy Med Name: CETIRIZINE 10MG  TABLETS] 90 tablet 1    Sig: TAKE 1 TABLET BY MOUTH DAILY      Ear, Nose, and Throat:  Antihistamines Passed - 01/30/2020 11:06 AM      Passed - Valid encounter within last 12 months    Recent Outpatient Visits           7 months ago Essential hypertension   [facility] [place]   1 year ago Chronic bilateral low back pain with bilateral sciatica   [facility], [place]   1 year ago Type 2 diabetes mellitus with hyperglycemia, with long-term current use of insulin [facility]   [facility], [facility], [place]ws   1 year ago Pain, dental   [facility], [place]   1 year ago Essential hypertension   [facility] [place], [place], [place]       Future Appointments             In 2 weeks Redge Gainer, Laural Benes, DO [facility], [facility]   In 9 months  [facility], [facility]

## 2020-01-31 ENCOUNTER — Other Ambulatory Visit: Payer: Self-pay | Admitting: Urology

## 2020-02-03 ENCOUNTER — Encounter: Payer: 59 | Admitting: Family Medicine

## 2020-02-05 ENCOUNTER — Telehealth: Payer: Self-pay

## 2020-02-06 ENCOUNTER — Other Ambulatory Visit: Payer: Self-pay | Admitting: Family Medicine

## 2020-02-06 NOTE — Telephone Encounter (Signed)
[facility] scheduled 1/18

## 2020-02-06 NOTE — Telephone Encounter (Signed)
Requested medication (s) are due for refill today: no  Requested medication (s) are on the active medication list: yes  Last refill:  11/10/2009  Future visit scheduled:yes   Notes to clinic:  this refill cannot be delegated    Requested Prescriptions  Pending Prescriptions Disp Refills   primidone (MYSOLINE) 50 MG tablet [Pharmacy Med Name: PRIMIDONE 50MG  TABLETS] 180 tablet 1    Sig: TAKE 2 TABLETS(100 MG) BY MOUTH DAILY      Not Delegated - Neurology:  Anticonvulsants Failed - 02/06/2020  3:34 AM      Failed - This refill cannot be delegated      Failed - HCT in normal range and within 360 days    HCT  Date Value Ref Range Status  01/14/2020 53.7 (H) 39.0 - 52.0 % Final   Hematocrit  Date Value Ref Range Status  12/24/2018 46.6 37.5 - 51.0 % Final          Failed - HGB in normal range and within 360 days    Hemoglobin  Date Value Ref Range Status  01/14/2020 17.7 (H) 13.0 - 17.0 g/dL Final  01/16/2020 70/78/6754 13.0 - 17.7 g/dL Final          Passed - PLT in normal range and within 360 days    Platelets  Date Value Ref Range Status  01/14/2020 258 150 - 400 K/uL Final  12/24/2018 185 150 - 450 x10E3/uL Final          Passed - WBC in normal range and within 360 days    WBC  Date Value Ref Range Status  01/14/2020 9.5 4.0 - 10.5 K/uL Final          Passed - Valid encounter within last 12 months    Recent Outpatient Visits           7 months ago Essential hypertension   [facility], [place]   1 year ago Chronic bilateral low back pain with bilateral sciatica   [facility], [place]   1 year ago Type 2 diabetes mellitus with hyperglycemia, with long-term current use of insulin [facility])   [facility], [facility], [place] Hews   1 year ago Pain, dental   [facility] [place], [place]   1 year ago Essential hypertension   [facility] [place], [place]       Future Appointments             In 1 week Redge Gainer, Laural Benes, DO [facility]   In 9 months  [facility]

## 2020-02-06 NOTE — Telephone Encounter (Signed)
Previous RL patient. Overdue for appointment, routing to admin for appt and provider for possible refill

## 2020-02-11 ENCOUNTER — Other Ambulatory Visit: Payer: Self-pay

## 2020-02-11 ENCOUNTER — Other Ambulatory Visit: Payer: 59

## 2020-02-11 DIAGNOSIS — E291 Testicular hypofunction: Secondary | ICD-10-CM

## 2020-02-12 LAB — TESTOSTERONE: Testosterone: 422 ng/dL (ref 264–916)

## 2020-02-12 LAB — HEMATOCRIT: Hematocrit: 50.4 % (ref 37.5–51.0)

## 2020-02-13 ENCOUNTER — Telehealth: Payer: Self-pay | Admitting: Family Medicine

## 2020-02-13 NOTE — Telephone Encounter (Signed)
-----   Message from Riki Altes, MD sent at 02/12/2020  7:19 AM EST ----- Testosterone level looks good at 422; hematocrit in normal range Please schedule follow-up July 2022 for office visit with PSA, testosterone, hematocrit

## 2020-02-13 NOTE — Telephone Encounter (Signed)
Patient notified and voiced understanding. Appointments have been scheduled.  

## 2020-02-18 ENCOUNTER — Encounter: Payer: 59 | Admitting: Family Medicine

## 2020-02-22 ENCOUNTER — Other Ambulatory Visit: Payer: Self-pay | Admitting: Family Medicine

## 2020-02-24 ENCOUNTER — Other Ambulatory Visit: Payer: Self-pay

## 2020-02-24 ENCOUNTER — Ambulatory Visit (INDEPENDENT_AMBULATORY_CARE_PROVIDER_SITE_OTHER): Payer: Medicare Other | Admitting: Nurse Practitioner

## 2020-02-24 ENCOUNTER — Encounter: Payer: Self-pay | Admitting: Nurse Practitioner

## 2020-02-24 ENCOUNTER — Other Ambulatory Visit: Payer: Self-pay | Admitting: Family Medicine

## 2020-02-24 VITALS — BP 126/71 | HR 91 | Temp 99.0°F | Ht 67.01 in | Wt 204.5 lb

## 2020-02-24 DIAGNOSIS — E669 Obesity, unspecified: Secondary | ICD-10-CM

## 2020-02-24 DIAGNOSIS — E782 Mixed hyperlipidemia: Secondary | ICD-10-CM | POA: Diagnosis not present

## 2020-02-24 DIAGNOSIS — E291 Testicular hypofunction: Secondary | ICD-10-CM

## 2020-02-24 DIAGNOSIS — E1142 Type 2 diabetes mellitus with diabetic polyneuropathy: Secondary | ICD-10-CM

## 2020-02-24 DIAGNOSIS — I10 Essential (primary) hypertension: Secondary | ICD-10-CM | POA: Diagnosis not present

## 2020-02-24 DIAGNOSIS — Z23 Encounter for immunization: Secondary | ICD-10-CM

## 2020-02-24 DIAGNOSIS — F3341 Major depressive disorder, recurrent, in partial remission: Secondary | ICD-10-CM

## 2020-02-24 DIAGNOSIS — Z125 Encounter for screening for malignant neoplasm of prostate: Secondary | ICD-10-CM

## 2020-02-24 DIAGNOSIS — E1141 Type 2 diabetes mellitus with diabetic mononeuropathy: Secondary | ICD-10-CM | POA: Diagnosis not present

## 2020-02-24 DIAGNOSIS — E1165 Type 2 diabetes mellitus with hyperglycemia: Secondary | ICD-10-CM | POA: Diagnosis not present

## 2020-02-24 DIAGNOSIS — L602 Onychogryphosis: Secondary | ICD-10-CM

## 2020-02-24 DIAGNOSIS — I25118 Atherosclerotic heart disease of native coronary artery with other forms of angina pectoris: Secondary | ICD-10-CM

## 2020-02-24 DIAGNOSIS — Z794 Long term (current) use of insulin: Secondary | ICD-10-CM

## 2020-02-24 DIAGNOSIS — Z0000 Encounter for general adult medical examination without abnormal findings: Secondary | ICD-10-CM

## 2020-02-24 DIAGNOSIS — K219 Gastro-esophageal reflux disease without esophagitis: Secondary | ICD-10-CM

## 2020-02-24 MED ORDER — BUPROPION HCL ER (XL) 150 MG PO TB24: 150.0000 mg | ORAL_TABLET | Freq: Every day | ORAL | 1 refills | Status: DC

## 2020-02-24 MED ORDER — PANTOPRAZOLE SODIUM 40 MG PO TBEC: 40.0000 mg | DELAYED_RELEASE_TABLET | Freq: Two times a day (BID) | ORAL | 3 refills | Status: DC

## 2020-02-24 MED ORDER — METFORMIN HCL 1000 MG PO TABS: 1000.0000 mg | ORAL_TABLET | Freq: Two times a day (BID) | ORAL | 0 refills | Status: DC

## 2020-02-24 MED ORDER — AMITRIPTYLINE HCL 50 MG PO TABS: ORAL_TABLET | ORAL | 1 refills | Status: DC

## 2020-02-24 MED ORDER — FAMOTIDINE 40 MG PO TABS: 40.0000 mg | ORAL_TABLET | Freq: Every day | ORAL | 1 refills | Status: DC

## 2020-02-24 MED ORDER — LANTUS SOLOSTAR 100 UNIT/ML ~~LOC~~ SOPN
85.0000 [IU] | PEN_INJECTOR | Freq: Every day | SUBCUTANEOUS | 1 refills | Status: AC
Start: 2020-02-24 — End: ?

## 2020-02-24 MED ORDER — JARDIANCE 25 MG PO TABS: 25.0000 mg | ORAL_TABLET | Freq: Every day | ORAL | 1 refills | Status: AC

## 2020-02-24 MED ORDER — LOSARTAN POTASSIUM 50 MG PO TABS: 50.0000 mg | ORAL_TABLET | Freq: Every day | ORAL | 1 refills | Status: AC

## 2020-02-24 MED ORDER — CITALOPRAM HYDROBROMIDE 10 MG PO TABS: 10.0000 mg | ORAL_TABLET | Freq: Every day | ORAL | 1 refills | Status: DC

## 2020-02-24 NOTE — Assessment & Plan Note (Signed)
A1c remains uncontrolled.  Continue to follow up and follow medication regimen as discussed with Endocrinology.

## 2020-02-24 NOTE — Assessment & Plan Note (Signed)
Stable.  Continue to follow up with pain management.

## 2020-02-24 NOTE — Assessment & Plan Note (Signed)
Stable.  Continue to follow up with Cardiology.

## 2020-02-24 NOTE — Assessment & Plan Note (Signed)
Recommend a diet low in fat and calories to aid in weight loss.

## 2020-02-24 NOTE — Assessment & Plan Note (Signed)
Well-controlled on current medication regimen 

## 2020-02-24 NOTE — Assessment & Plan Note (Signed)
Stable.  Continue with current medication regimen as discussed with Gastroenterology.

## 2020-02-24 NOTE — Assessment & Plan Note (Signed)
Last A1c was 8.3 on 01/11/2020.  Continue with medication regimen and to follow up with Endocrinology for management.

## 2020-02-24 NOTE — Assessment & Plan Note (Signed)
Blood pressure well controlled.  Continue with current medication regimen.

## 2020-02-24 NOTE — Assessment & Plan Note (Signed)
Will make further recommendations based on lab results.

## 2020-02-24 NOTE — Progress Notes (Addendum)
BP 126/71   Pulse 91   Temp 99 F (37.2 C)   Ht 5' 7.01" (1.702 m)   Wt 204 lb 8 oz (92.8 kg)   SpO2 94%   BMI 32.02 kg/m    Subjective:    Patient ID: Corey [name], male    DOB: Jan 29, 1956, 65 y.o.   MRN: [id number]  HPI: Corey [name] is a 65 y.o. male presenting on 02/24/2020 for comprehensive medical examination. Current medical complaints include:none  Interim Problems from his last visit: yes.  Patient was hospitalized with COVID 19.  Patient states he is recovering well and denies any lingering SOB, fatigue, cough, or congestion.   HYPERTENSION / HYPERLIPIDEMIA Satisfied with current treatment? yes Duration of hypertension: chronic BP monitoring frequency: not checking BP range:  BP medication side effects: no Past BP meds: Losartan 24m, Imdur 6103mand Metoprolol 2549mDuration of hyperlipidemia: chronic Cholesterol medication side effects: yes Cholesterol supplements: None. Past cholesterol medications: Crestor [id number] compliance: excellent compliance Aspirin: yes Recent stressors: no Recurrent headaches: no Visual changes: no Palpitations: no Dyspnea: no Chest pain: no Lower extremity edema: no Dizzy/lightheaded: no  DIABETES Hypoglycemic episodes:no Polydipsia/polyuria: no Visual disturbance: no Chest pain: no Paresthesias: no Glucose Monitoring: yes  Accucheck frequency: Daily  Fasting glucose: 150s  Post prandial: 180s Taking Insulin?: yes  Long acting insulin: Lantus 85 U daily Blood Pressure Monitoring: not checking Retinal Examination: Not up to Date Foot Exam: Up to Date   Subjective:  Corey [name] 64 y62. male with hypertension. Current Outpatient Medications  Medication Sig Dispense Refill  . acetaminophen (TYLENOL) 500 MG tablet Take 1,000 mg by mouth every 6 (six) hours as needed for moderate pain or headache.    . albuterol (PROVENTIL) (2.5 MG/3ML) 0.083% nebulizer solution USE 1 VIAL IN NEBULIZER EVERY 6  HOURS - and as needed. 3 mL 2  . albuterol (VENTOLIN HFA) 108 (90 Base) MCG/ACT inhaler INHALE 2 PUFFS INTO THE LUNGS EVERY 6 HOURS AS NEEDED FOR WHEEZING OR SHORTNESS OF BREATH (Patient taking differently: Inhale 2 puffs into the lungs every 6 (six) hours as needed for wheezing or shortness of breath.) 25.5 g 0  . amitriptyline (ELAVIL) 50 MG tablet TAKE 1 TO 2 TABLETS(50 TO 100 MG) BY MOUTH AT BEDTIME 180 tablet 1  . aspirin EC 81 MG tablet Take 81 mg by mouth daily.    . azMarland Kitchenlastine (ASTELIN) 0.1 % nasal spray USE 2 SPRAYS IN EACH NOSTRIL TWICE DAILY (Patient taking differently: Place 2 sprays into both nostrils 2 (two) times daily.) 30 mL 1  . B-D UF III MINI PEN NEEDLES 31G X 5 MM MISC USE TWICE DAILY 100 each 11  . Blood Glucose Monitoring Suppl (ONETOUCH VERIO) w/Device KIT Use to check blood sugar 2-3 times daily and document for provider visits. 1 kit 0  . buPROPion (WELLBUTRIN XL) 150 MG 24 hr tablet Take 1 tablet (150 mg total) by mouth daily. 90 tablet 1  . cetirizine (ZYRTEC) 10 MG tablet TAKE 1 TABLET BY MOUTH DAILY 90 tablet 1  . Cholecalciferol (VITAMIN D3) 5000 units TABS Take 5,000 Units by mouth daily.     . citalopram (CELEXA) 10 MG tablet Take 1 tablet (10 mg total) by mouth daily. 90 tablet 1  . clopidogrel (PLAVIX) 75 MG tablet Take 75 mg by mouth daily.    . diclofenac sodium (VOLTAREN) 1 % GEL Apply 1 application topically 4 (four) times daily as needed (pain).     .Marland Kitchen  famotidine (PEPCID) 40 MG tablet Take 1 tablet (40 mg total) by mouth daily. 90 tablet 1  . furosemide (LASIX) 20 MG tablet TAKE 1 TABLET(20 MG) BY MOUTH TWICE DAILY 180 tablet 0  . glucose blood test strip Use to check blood sugar 2-3 times daily and document for provider visits. 100 each 12  . isosorbide mononitrate (IMDUR) 60 MG 24 hr tablet Take 60 mg by mouth daily.    Marland Kitchen JARDIANCE 25 MG TABS tablet Take 1 tablet (25 mg total) by mouth daily. 90 tablet 1  . Lancets (ONETOUCH ULTRASOFT) lancets Use to check  blood sugar 2-3 times daily and document for provider visits. 100 each 12  . Lancets Misc. (ACCU-CHEK FASTCLIX LANCET) KIT 1 Units by Does not apply route 2 (two) times daily. 3 kit 3  . LANTUS SOLOSTAR 100 UNIT/ML Solostar Pen Inject 85 Units into the skin at bedtime. 45 mL 1  . losartan (COZAAR) 50 MG tablet Take 1 tablet (50 mg total) by mouth daily. 90 tablet 1  . magnesium gluconate (MAGONATE) 500 MG tablet Take 500 mg by mouth daily.    . meloxicam (MOBIC) 7.5 MG tablet TAKE 1 TABLET(7.5 MG) BY MOUTH DAILY 30 tablet 0  . metFORMIN (GLUCOPHAGE) 1000 MG tablet Take 1 tablet (1,000 mg total) by mouth 2 (two) times daily. 180 tablet 0  . metoprolol succinate (TOPROL-XL) 25 MG 24 hr tablet Take 25 mg by mouth daily.    . montelukast (SINGULAIR) 10 MG tablet TAKE 1 TABLET(10 MG) BY MOUTH DAILY (Patient taking differently: Take 10 mg by mouth at bedtime.) 90 tablet 3  . NEEDLE, DISP, 18 G 18G X 1" MISC Use as directed 15 each 0  . pantoprazole (PROTONIX) 40 MG tablet Take 1 tablet (40 mg total) by mouth 2 (two) times daily before a meal. 180 tablet 3  . pregabalin (LYRICA) 150 MG capsule Take 1 capsule (150 mg total) by mouth 2 (two) times daily. 60 capsule 5  . primidone (MYSOLINE) 50 MG tablet TAKE 2 TABLETS(100 MG) BY MOUTH DAILY 180 tablet 1  . rosuvastatin (CRESTOR) 20 MG tablet TAKE 1 TABLET BY MOUTH  DAILY (Patient taking differently: Take 20 mg by mouth daily.) 90 tablet 1  . Semaglutide, 1 MG/DOSE, 4 MG/3ML SOPN Inject 1 mg into the skin once a week.    . SYRINGE-NEEDLE, DISP, 3 ML (LUER LOCK SAFETY SYRINGES) 21G X 1-1/2" 3 ML MISC Use as directed for testosterone administration 15 each 0  . testosterone cypionate (DEPOTESTOSTERONE CYPIONATE) 200 MG/ML injection Inject 1 mL (200 mg total) into the muscle every 14 (fourteen) days. 6 mL 0  . tiZANidine (ZANAFLEX) 4 MG tablet Take 1 tablet (4 mg total) by mouth every 8 (eight) hours as needed for muscle spasms. 60 tablet 4  . WIXELA INHUB  250-50 MCG/DOSE AEPB Inhale 1 puff into the lungs 2 (two) times daily.     No current facility-administered medications for this visit.      Depression Screen done today and results listed below:  Depression screen [facility] 2/9 03/04/2020 02/24/2020 11/15/2019 09/17/2019 06/24/2019  Decreased Interest 0 0 0 0 0  Down, Depressed, Hopeless 0 0 0 0 0  PHQ - 2 Score 0 0 0 0 0  Altered sleeping 0 0 0 - 0  Tired, decreased energy 0 0 0 - 0  Change in appetite 0 0 0 - 0  Feeling bad or failure about yourself  0 0 0 - 0  Trouble concentrating  0 0 0 - 0  Moving slowly or fidgety/restless 0 0 0 - 0  Suicidal thoughts 0 0 0 - 0  PHQ-9 Score 0 0 0 - 0  Difficult doing work/chores - Not difficult at all Not difficult at all - -    The patient does not have a history of falls. I did complete a risk assessment for falls. A plan of care for falls was documented.   Past Medical History:  Past Medical History:  Diagnosis Date  . Asthma   . Diabetes ([place])   . GERD (gastroesophageal reflux disease)   . Hyperlipidemia   . Hypertension   . Legg-Perthes disease     Surgical History:  Past Surgical History:  Procedure Laterality Date  . CARDIAC CATHETERIZATION    . COLONOSCOPY WITH PROPOFOL N/A 04/16/2019   Procedure: COLONOSCOPY WITH PROPOFOL;  Surgeon: Jonathon Bellows, MD;  Location: [facility] ENDOSCOPY;  Service: Gastroenterology;  Laterality: N/A;  . COLONOSCOPY WITH PROPOFOL N/A 06/14/2019   Procedure: COLONOSCOPY WITH PROPOFOL;  Surgeon: Jonathon Bellows, MD;  Location: [facility] ENDOSCOPY;  Service: Gastroenterology;  Laterality: N/A;  . COLONOSCOPY WITH PROPOFOL N/A 07/30/2019   Procedure: COLONOSCOPY WITH PROPOFOL;  Surgeon: Jonathon Bellows, MD;  Location: [facility] ENDOSCOPY;  Service: Gastroenterology;  Laterality: N/A;  . ESOPHAGOGASTRODUODENOSCOPY (EGD) WITH PROPOFOL N/A 04/16/2019   Procedure: ESOPHAGOGASTRODUODENOSCOPY (EGD) WITH PROPOFOL;  Surgeon: Jonathon Bellows, MD;  Location: [facility] ENDOSCOPY;  Service: Gastroenterology;   Laterality: N/A;  . Foot Surgery    . HIP SURGERY    . RIGHT/LEFT HEART CATH AND CORONARY ANGIOGRAPHY N/A 07/25/2018   Procedure: RIGHT/LEFT HEART CATH AND CORONARY ANGIOGRAPHY;  Surgeon: Yolonda Kida, MD;  Location: [facility];  Service: Cardiovascular;  Laterality: N/A;    Medications:  Current Outpatient Medications on File Prior to Visit  Medication Sig  . acetaminophen (TYLENOL) 500 MG tablet Take 1,000 mg by mouth every 6 (six) hours as needed for moderate pain or headache.  . albuterol (VENTOLIN HFA) 108 (90 Base) MCG/ACT inhaler INHALE 2 PUFFS INTO THE LUNGS EVERY 6 HOURS AS NEEDED FOR WHEEZING OR SHORTNESS OF BREATH (Patient taking differently: Inhale 2 puffs into the lungs every 6 (six) hours as needed for wheezing or shortness of breath.)  . aspirin EC 81 MG tablet Take 81 mg by mouth daily.  Marland Kitchen azelastine (ASTELIN) 0.1 % nasal spray USE 2 SPRAYS IN EACH NOSTRIL TWICE DAILY (Patient taking differently: Place 2 sprays into both nostrils 2 (two) times daily.)  . B-D UF III MINI PEN NEEDLES 31G X 5 MM MISC USE TWICE DAILY  . cetirizine (ZYRTEC) 10 MG tablet TAKE 1 TABLET BY MOUTH DAILY  . Cholecalciferol (VITAMIN D3) 5000 units TABS Take 5,000 Units by mouth daily.   . clopidogrel (PLAVIX) 75 MG tablet Take 75 mg by mouth daily.  . diclofenac sodium (VOLTAREN) 1 % GEL Apply 1 application topically 4 (four) times daily as needed (pain).   . furosemide (LASIX) 20 MG tablet TAKE 1 TABLET(20 MG) BY MOUTH TWICE DAILY  . isosorbide mononitrate (IMDUR) 60 MG 24 hr tablet Take 60 mg by mouth daily.  . Lancets Misc. (ACCU-CHEK FASTCLIX LANCET) KIT 1 Units by Does not apply route 2 (two) times daily.  . magnesium gluconate (MAGONATE) 500 MG tablet Take 500 mg by mouth daily.  . meloxicam (MOBIC) 7.5 MG tablet TAKE 1 TABLET(7.5 MG) BY MOUTH DAILY  . metoprolol succinate (TOPROL-XL) 25 MG 24 hr tablet Take 25 mg by mouth daily.  Marland Kitchen  montelukast (SINGULAIR) 10 MG tablet TAKE 1  TABLET(10 MG) BY MOUTH DAILY (Patient taking differently: Take 10 mg by mouth at bedtime.)  . NEEDLE, DISP, 18 G 18G X 1" MISC Use as directed  . pregabalin (LYRICA) 150 MG capsule Take 1 capsule (150 mg total) by mouth 2 (two) times daily.  . primidone (MYSOLINE) 50 MG tablet TAKE 2 TABLETS(100 MG) BY MOUTH DAILY  . rosuvastatin (CRESTOR) 20 MG tablet TAKE 1 TABLET BY MOUTH  DAILY (Patient taking differently: Take 20 mg by mouth daily.)  . Semaglutide, 1 MG/DOSE, 4 MG/3ML SOPN Inject 1 mg into the skin once a week.  . SYRINGE-NEEDLE, DISP, 3 ML (LUER LOCK SAFETY SYRINGES) 21G X 1-1/2" 3 ML MISC Use as directed for testosterone administration  . testosterone cypionate (DEPOTESTOSTERONE CYPIONATE) 200 MG/ML injection Inject 1 mL (200 mg total) into the muscle every 14 (fourteen) days.  Grant Ruts INHUB 250-50 MCG/DOSE AEPB Inhale 1 puff into the lungs 2 (two) times daily.   No current facility-administered medications on file prior to visit.    Allergies:  No Known Allergies  Social History:  Social History   Socioeconomic History  . Marital status: Divorced    Spouse name: Not on file  . Number of children: Not on file  . Years of education: Not on file  . Highest education level: 8th grade  Occupational History  . Occupation: disability  Tobacco Use  . Smoking status: Former Smoker    Packs/day: 0.50    Types: Cigarettes  . Smokeless tobacco: Current User    Types: Chew  . Tobacco comment: quit 40 years ago   Vaping Use  . Vaping Use: Never used  Substance and Sexual Activity  . Alcohol use: Not Currently  . Drug use: Never  . Sexual activity: Not on file  Other Topics Concern  . Not on file  Social History Narrative  . Not on file   Social Determinants of Health   Financial Resource Strain: Low Risk   . Difficulty of Paying Living Expenses: Not hard at all  Food Insecurity: No Food Insecurity  . Worried About Charity fundraiser in the Last Year: Never true  . Ran  Out of Food in the Last Year: Never true  Transportation Needs: No Transportation Needs  . Lack of Transportation (Medical): No  . Lack of Transportation (Non-Medical): No  Physical Activity: Insufficiently Active  . Days of Exercise per Week: 2 days  . Minutes of Exercise per Session: 60 min  Stress: No Stress Concern Present  . Feeling of Stress : Not at all  Social Connections: Not on file  Intimate Partner Violence: Not on file   Social History   Tobacco Use  Smoking Status Former Smoker  . Packs/day: 0.50  . Types: Cigarettes  Smokeless Tobacco Current User  . Types: Chew  Tobacco Comment   quit 40 years ago    Social History   Substance and Sexual Activity  Alcohol Use Not Currently    Family History:  Family History  Problem Relation Age of Onset  . Cancer Mother   . Heart disease Mother   . Diabetes Father     Past medical history, surgical history, medications, allergies, family history and social history reviewed with patient today and changes made to appropriate areas of the chart.   Review of Systems - General ROS: negative    All other ROS negative except what is listed above and in the HPI.  Objective:    BP 126/71   Pulse 91   Temp 99 F (37.2 C)   Ht 5' 7.01" (1.702 m)   Wt 204 lb 8 oz (92.8 kg)   SpO2 94%   BMI 32.02 kg/m   Wt Readings from Last 3 Encounters:  03/04/20 210 lb 8 oz (95.5 kg)  02/24/20 204 lb 8 oz (92.8 kg)  01/11/20 212 lb 1.3 oz (96.2 kg)    Physical Exam Vitals reviewed.  Constitutional:      Appearance: Normal appearance.  HENT:     Head: Normocephalic.     Right Ear: Tympanic membrane and external ear normal. There is no impacted cerumen.     Left Ear: Tympanic membrane and external ear normal. There is no impacted cerumen.     Nose: Nose normal.     Mouth/Throat:     Mouth: Mucous membranes are moist.     Pharynx: No oropharyngeal exudate or posterior oropharyngeal erythema.  Eyes:     Pupils: Pupils  are equal, round, and reactive to light.  Cardiovascular:     Rate and Rhythm: Normal rate and regular rhythm.  Pulmonary:     Effort: Pulmonary effort is normal.     Breath sounds: Normal breath sounds.  Abdominal:     General: Abdomen is flat. Bowel sounds are normal.     Palpations: Abdomen is soft.  Musculoskeletal:        General: Normal range of motion.     Cervical back: Normal range of motion and neck supple.     Right foot: Normal range of motion.     Left foot: Normal range of motion.  Feet:     Right foot:     Protective Sensation: 10 sites tested. 10 sites sensed.     Skin integrity: Callus and dry skin present.     Toenail Condition: Right toenails are abnormally thick and long.     Left foot:     Protective Sensation: 10 sites tested. 10 sites sensed.     Skin integrity: Callus and dry skin present.     Toenail Condition: Left toenails are abnormally thick and long.  Skin:    General: Skin is warm and dry.     Capillary Refill: Capillary refill takes less than 2 seconds.  Neurological:     General: No focal deficit present.     Mental Status: He is alert and oriented to person, place, and time.     Cranial Nerves: No cranial nerve deficit.  Psychiatric:        Mood and Affect: Mood normal.        Behavior: Behavior normal.        Thought Content: Thought content normal.     Results for orders placed or performed in visit on 02/24/20  CBC w/Diff  Result Value Ref Range   WBC 10.8 3.4 - 10.8 x10E3/uL   RBC 6.08 (H) 4.14 - 5.80 x10E6/uL   Hemoglobin 19.2 (H) 13.0 - 17.7 g/dL   Hematocrit 57.3 (H) 37.5 - 51.0 %   MCV 94 79 - 97 fL   MCH 31.6 26.6 - 33.0 pg   MCHC 33.5 31.5 - 35.7 g/dL   RDW 17.0 (H) 11.6 - 15.4 %   Platelets 193 150 - 450 x10E3/uL   Neutrophils 71 Not Estab. %   Lymphs 16 Not Estab. %   Monocytes 8 Not Estab. %   Eos 3 Not Estab. %   Basos 1 Not Estab. %  Neutrophils Absolute 7.7 (H) 1.4 - 7.0 x10E3/uL   Lymphocytes Absolute 1.8 0.7 -  3.1 x10E3/uL   Monocytes Absolute 0.9 0.1 - 0.9 x10E3/uL   EOS (ABSOLUTE) 0.3 0.0 - 0.4 x10E3/uL   Basophils Absolute 0.1 0.0 - 0.2 x10E3/uL   Immature Granulocytes 1 Not Estab. %   Immature Grans (Abs) 0.1 0.0 - 0.1 x10E3/uL  Lipid Profile  Result Value Ref Range   Cholesterol, Total 176 100 - 199 mg/dL   Triglycerides 784 (HH) 0 - 149 mg/dL   HDL 28 (L) >39 mg/dL   VLDL Cholesterol Cal 108 (H) 5 - 40 mg/dL   LDL Chol Calc (NIH) 40 0 - 99 mg/dL   Chol/HDL Ratio 6.3 (H) 0.0 - 5.0 ratio  PSA  Result Value Ref Range   Prostate Specific Ag, Serum 0.3 0.0 - 4.0 ng/mL  Comprehensive metabolic panel  Result Value Ref Range   Glucose 220 (H) 65 - 99 mg/dL   BUN 18 8 - 27 mg/dL   Creatinine, Ser 1.37 (H) 0.76 - 1.27 mg/dL   GFR calc non Af Amer 55 (L) >59 mL/min/1.73   GFR calc Af Amer 63 >59 mL/min/1.73   BUN/Creatinine Ratio 13 10 - 24   Sodium 140 134 - 144 mmol/L   Potassium 4.1 3.5 - 5.2 mmol/L   Chloride 95 (L) 96 - 106 mmol/L   CO2 18 (L) 20 - 29 mmol/L   Calcium 10.1 8.6 - 10.2 mg/dL   Total Protein 7.8 6.0 - 8.5 g/dL   Albumin 4.8 3.8 - 4.8 g/dL   Globulin, Total 3.0 1.5 - 4.5 g/dL   Albumin/Globulin Ratio 1.6 1.2 - 2.2   Bilirubin Total 0.7 0.0 - 1.2 mg/dL   Alkaline Phosphatase 143 (H) 44 - 121 IU/L   AST 20 0 - 40 IU/L   ALT 32 0 - 44 IU/L      Assessment & Plan:   Problem List Items Addressed This Visit      Cardiovascular and Mediastinum   Hypertension associated with type 2 diabetes mellitus ([place])    Blood pressure well controlled.  Continue with current medication regimen.      Relevant Medications   JARDIANCE 25 MG TABS tablet   LANTUS SOLOSTAR 100 UNIT/ML Solostar Pen   losartan (COZAAR) 50 MG tablet   metFORMIN (GLUCOPHAGE) 1000 MG tablet   Coronary artery disease of native artery of native heart with stable angina pectoris (HCC)    Stable.  Continue to follow up with Cardiology.      Relevant Medications   amitriptyline (ELAVIL) 50 MG tablet    buPROPion (WELLBUTRIN XL) 150 MG 24 hr tablet   citalopram (CELEXA) 10 MG tablet   losartan (COZAAR) 50 MG tablet   Other Relevant Orders   CBC w/Diff     Digestive   GERD (gastroesophageal reflux disease)    Stable.  Continue with current medication regimen as discussed with Gastroenterology.      Relevant Medications   famotidine (PEPCID) 40 MG tablet   pantoprazole (PROTONIX) 40 MG tablet     Endocrine   Diabetic peripheral neuropathy associated with type 2 diabetes mellitus ([place])    Stable.  Continue to follow up with pain management.       Relevant Medications   amitriptyline (ELAVIL) 50 MG tablet   buPROPion (WELLBUTRIN XL) 150 MG 24 hr tablet   citalopram (CELEXA) 10 MG tablet   JARDIANCE 25 MG TABS tablet   LANTUS SOLOSTAR 100 UNIT/ML Solostar  Pen   losartan (COZAAR) 50 MG tablet   metFORMIN (GLUCOPHAGE) 1000 MG tablet   Type 2 diabetes mellitus with hyperglycemia, with long-term current use of insulin (HCC)    Last A1c was 8.3 on 01/11/2020.  Continue with medication regimen and to follow up with Endocrinology for management.       Relevant Medications   JARDIANCE 25 MG TABS tablet   LANTUS SOLOSTAR 100 UNIT/ML Solostar Pen   losartan (COZAAR) 50 MG tablet   metFORMIN (GLUCOPHAGE) 1000 MG tablet   Other Relevant Orders   CBC w/Diff (Completed)   Comp Met (CMET)   Comprehensive metabolic panel (Completed)   Comprehensive metabolic panel   CBC w/Diff     Other   Major depression    Well controlled on current medication regimen.        Relevant Medications   amitriptyline (ELAVIL) 50 MG tablet   buPROPion (WELLBUTRIN XL) 150 MG 24 hr tablet   citalopram (CELEXA) 10 MG tablet   Obesity (BMI 30-39.9)    Recommend a diet low in fat and calories to aid in weight loss.       Relevant Medications   JARDIANCE 25 MG TABS tablet   LANTUS SOLOSTAR 100 UNIT/ML Solostar Pen   metFORMIN (GLUCOPHAGE) 1000 MG tablet   RESOLVED: Hyperlipidemia    Will make further  recommendations based on lab results.       Relevant Medications   losartan (COZAAR) 50 MG tablet   Other Relevant Orders   CBC w/Diff (Completed)   Lipid Profile (Completed)   Comp Met (CMET)   Lipid Profile    Other Visit Diagnoses    Routine general medical examination at a health care facility    -  Primary   Vaccines up to date. Screening labs checked today. Colonoscopy up to date.Discussed diabetic eye exam during visit. Call with any concerns.   Diabetic mononeuropathy associated with type 2 diabetes mellitus (HCC)       Relevant Medications   amitriptyline (ELAVIL) 50 MG tablet   buPROPion (WELLBUTRIN XL) 150 MG 24 hr tablet   citalopram (CELEXA) 10 MG tablet   JARDIANCE 25 MG TABS tablet   LANTUS SOLOSTAR 100 UNIT/ML Solostar Pen   losartan (COZAAR) 50 MG tablet   metFORMIN (GLUCOPHAGE) 1000 MG tablet   Other Relevant Orders   Comp Met (CMET)   Ambulatory referral to Podiatry   Comprehensive metabolic panel   CBC w/Diff   Lipid Profile   Hypogonadism in male       Immunization due       Relevant Orders   Flu Vaccine QUAD 6+ mos PF IM (Fluarix Quad PF) (Completed)   Screening for prostate cancer       Will make recommendations based on lab results.   Relevant Orders   PSA (Completed)   Overgrown toenails       Recommend patient see Podiatry for evaluation and toenail trimming.       Discussed aspirin prophylaxis for myocardial infarction prevention and decision was discussed to continue per Cardiology recommendation.  LABORATORY TESTING:  Health maintenance labs ordered today as discussed above.   The natural history of prostate cancer and ongoing controversy regarding screening and potential treatment outcomes of prostate cancer has been discussed with the patient. The meaning of a false positive PSA and a false negative PSA has been discussed. He indicates understanding of the limitations of this screening test and wishes to proceed with screening PSA  testing.  IMMUNIZATIONS:   -  Tdap: Tetanus vaccination status reviewed: last tetanus booster within 10 years. - Influenza: Up to date - Pneumovax: Up to date - Prevnar: Not applicable  SCREENING: - Colonoscopy: Up to date  Discussed with patient purpose of the colonoscopy is to detect colon cancer at curable precancerous or early stages   -Spirometry: Up to date 03/26/2019.  Sees Pulmonology.   PATIENT COUNSELING:    Sexuality: Discussed sexually transmitted diseases, partner selection, use of condoms, avoidance of unintended pregnancy  and contraceptive alternatives.   Advised to avoid cigarette smoking.  I discussed with the patient that most people either abstain from alcohol or drink within safe limits (<=14/week and <=4 drinks/occasion for males, <=7/weeks and <= 3 drinks/occasion for females) and that the risk for alcohol disorders and other health effects rises proportionally with the number of drinks per week and how often a drinker exceeds daily limits.  Discussed cessation/primary prevention of drug use and availability of treatment for abuse.   Diet: Encouraged to adjust caloric intake to maintain  or achieve ideal body weight, to reduce intake of dietary saturated fat and total fat, to limit sodium intake by avoiding high sodium foods and not adding table salt, and to maintain adequate dietary potassium and calcium preferably from fresh fruits, vegetables, and low-fat dairy products.    stressed the importance of regular exercise  Injury prevention: Discussed safety belts, safety helmets, smoke detector, smoking near bedding or upholstery.   Dental health: Discussed importance of regular tooth brushing, flossing, and dental visits.   Follow up plan: NEXT PREVENTATIVE PHYSICAL DUE IN 1 YEAR. Return in about 3 months (around 05/24/2020) for Diabetic Check.

## 2020-02-25 ENCOUNTER — Telehealth: Payer: Self-pay

## 2020-02-25 ENCOUNTER — Telehealth: Payer: Self-pay | Admitting: General Practice

## 2020-02-25 LAB — CBC WITH DIFFERENTIAL/PLATELET
Basophils Absolute: 0.1 10*3/uL (ref 0.0–0.2)
Basos: 1 %
EOS (ABSOLUTE): 0.3 10*3/uL (ref 0.0–0.4)
Eos: 3 %
Hematocrit: 57.3 % — ABNORMAL HIGH (ref 37.5–51.0)
Hemoglobin: 19.2 g/dL — ABNORMAL HIGH (ref 13.0–17.7)
Immature Grans (Abs): 0.1 10*3/uL (ref 0.0–0.1)
Immature Granulocytes: 1 %
Lymphocytes Absolute: 1.8 10*3/uL (ref 0.7–3.1)
Lymphs: 16 %
MCH: 31.6 pg (ref 26.6–33.0)
MCHC: 33.5 g/dL (ref 31.5–35.7)
MCV: 94 fL (ref 79–97)
Monocytes Absolute: 0.9 10*3/uL (ref 0.1–0.9)
Monocytes: 8 %
Neutrophils Absolute: 7.7 10*3/uL — ABNORMAL HIGH (ref 1.4–7.0)
Neutrophils: 71 %
Platelets: 193 10*3/uL (ref 150–450)
RBC: 6.08 x10E6/uL — ABNORMAL HIGH (ref 4.14–5.80)
RDW: 17 % — ABNORMAL HIGH (ref 11.6–15.4)
WBC: 10.8 10*3/uL (ref 3.4–10.8)

## 2020-02-25 LAB — COMPREHENSIVE METABOLIC PANEL
ALT: 32 IU/L (ref 0–44)
AST: 20 IU/L (ref 0–40)
Albumin/Globulin Ratio: 1.6 (ref 1.2–2.2)
Albumin: 4.8 g/dL (ref 3.8–4.8)
Alkaline Phosphatase: 143 IU/L — ABNORMAL HIGH (ref 44–121)
BUN/Creatinine Ratio: 13 (ref 10–24)
BUN: 18 mg/dL (ref 8–27)
Bilirubin Total: 0.7 mg/dL (ref 0.0–1.2)
CO2: 18 mmol/L — ABNORMAL LOW (ref 20–29)
Calcium: 10.1 mg/dL (ref 8.6–10.2)
Chloride: 95 mmol/L — ABNORMAL LOW (ref 96–106)
Creatinine, Ser: 1.37 mg/dL — ABNORMAL HIGH (ref 0.76–1.27)
GFR calc Af Amer: 63 mL/min/{1.73_m2} (ref >59)
GFR calc non Af Amer: 55 mL/min/{1.73_m2} — ABNORMAL LOW (ref >59)
Globulin, Total: 3 g/dL (ref 1.5–4.5)
Glucose: 220 mg/dL — ABNORMAL HIGH (ref 65–99)
Potassium: 4.1 mmol/L (ref 3.5–5.2)
Sodium: 140 mmol/L (ref 134–144)
Total Protein: 7.8 g/dL (ref 6.0–8.5)

## 2020-02-25 LAB — LIPID PANEL
Chol/HDL Ratio: 6.3 ratio — ABNORMAL HIGH (ref 0.0–5.0)
Cholesterol, Total: 176 mg/dL (ref 100–199)
HDL: 28 mg/dL — ABNORMAL LOW (ref >39)
LDL Chol Calc (NIH): 40 mg/dL (ref 0–99)
Triglycerides: 784 mg/dL (ref 0–149)
VLDL Cholesterol Cal: 108 mg/dL — ABNORMAL HIGH (ref 5–40)

## 2020-02-25 LAB — PSA: Prostate Specific Ag, Serum: 0.3 ng/mL (ref 0.0–4.0)

## 2020-02-25 NOTE — Telephone Encounter (Signed)
°  Chronic Care Management   Outreach Note  02/25/2020 Name: Corey [name] MRN: [id number] DOB: [birth year]  Referred by: Particia Nearing, PA-C Reason for referral : Appointment (RNCM: Follow up for Chronic Disease Management and Care Coordination Needs)   An unsuccessful telephone outreach was attempted today. The patient was referred to the case management team for assistance with care management and care coordination.   Follow Up Plan: A HIPAA compliant phone message was left for the patient providing contact information and requesting a return call.   Alto Denver RN, MSN, CCM Community Care Coordinator [place]   [facility] Mobile: [phone]

## 2020-02-26 NOTE — Addendum Note (Signed)
Addended by: [name] on: 02/26/2020 10:48 AM   Modules accepted: Orders

## 2020-02-27 NOTE — Addendum Note (Signed)
Addended by: [name] on: 02/27/2020 11:55 AM   Modules accepted: Orders

## 2020-03-03 ENCOUNTER — Ambulatory Visit: Payer: Medicare Other | Admitting: Podiatry

## 2020-03-03 NOTE — Addendum Note (Signed)
Addended by: [name] on: 03/03/2020 08:04 AM   Modules accepted: Level of Service

## 2020-03-04 ENCOUNTER — Ambulatory Visit (INDEPENDENT_AMBULATORY_CARE_PROVIDER_SITE_OTHER): Payer: Medicare Other | Admitting: Nurse Practitioner

## 2020-03-04 ENCOUNTER — Encounter: Payer: Self-pay | Admitting: Nurse Practitioner

## 2020-03-04 ENCOUNTER — Other Ambulatory Visit: Payer: Self-pay

## 2020-03-04 VITALS — BP 119/72 | HR 88 | Temp 98.9°F | Ht 66.73 in | Wt 210.5 lb

## 2020-03-04 DIAGNOSIS — R0981 Nasal congestion: Secondary | ICD-10-CM | POA: Diagnosis not present

## 2020-03-04 DIAGNOSIS — Z794 Long term (current) use of insulin: Secondary | ICD-10-CM | POA: Diagnosis not present

## 2020-03-04 DIAGNOSIS — G894 Chronic pain syndrome: Secondary | ICD-10-CM

## 2020-03-04 DIAGNOSIS — E1165 Type 2 diabetes mellitus with hyperglycemia: Secondary | ICD-10-CM

## 2020-03-04 LAB — VERITOR FLU A/B WAIVED
Influenza A: NEGATIVE
Influenza B: NEGATIVE

## 2020-03-04 MED ORDER — ONETOUCH ULTRASOFT LANCETS MISC: 12 refills | Status: DC

## 2020-03-04 MED ORDER — ONETOUCH VERIO W/DEVICE KIT: PACK | 0 refills | Status: AC

## 2020-03-04 MED ORDER — GLUCOSE BLOOD VI STRP: ORAL_STRIP | 12 refills | Status: AC

## 2020-03-04 MED ORDER — TIZANIDINE HCL 4 MG PO TABS: 4.0000 mg | ORAL_TABLET | Freq: Three times a day (TID) | ORAL | 4 refills | Status: DC | PRN

## 2020-03-04 NOTE — Patient Instructions (Signed)
3 Key Steps to Take While Waiting for Your COVID-19 Test Result To help stop the spread of COVID-19, take these 3 key steps NOW while waiting for your test results: 1. Stay home and monitor your health. Stay home and monitor your health to help protect your friends, family, and others from possibly getting COVID-19 from you. Stay home and away from others:  If possible, stay away from others, especially people who are at higher risk for getting very sick from COVID-19, such as older adults and people with other medical conditions.  If you have been in contact with someone with COVID-19, stay home and away from others for 14 days after your last contact with that person. Follow the recommendations of your local public health department if you need to quarantine.  If you have a fever, cough or other symptoms of COVID-19, stay home and away from others (except to get medical care). Monitor your health:  Watch for fever, cough, shortness of breath, or other symptoms of COVID-19. Remember, symptoms may appear 2-14 days after exposure to COVID-19 and can include: ? Fever or chills ? Cough ? Shortness of breath or difficulty breathing ? Tiredness ? Muscle or body aches ? Headache ? New loss of taste or smell ? Sore throat ? Congestion or runny nose ? Nausea or vomiting ? Diarrhea 2. Think about the people you have recently been around. If you are diagnosed with COVID-19, a public health worker may call you to check on your health, discuss who you have been around, and ask where you spent time while you may have been able to spread COVID-19 to others. While you wait for your COVID-19 test result, think about everyone you have been around recently. This will be important information to give health workers if your test is positive.  Complete the information on the back of this page to help you remember everyone you have been around.  3. Answer the phone call from the health department. If a public  health worker calls you, answer the call to help slow the spread of COVID-19 in your community.  Discussions with health department staff are confidential. This means that your personal and medical information will be kept private and only shared with those who may need to know, like your health care provider.  Your name will not be shared with those you came in contact with. The health department will only notify people you were in close contact with (within 6 feet for more than 15 minutes) that they might have been exposed to COVID-19. Think about the people you have recently been around If you test positive and are diagnosed with COVID-19, someone from the health department may call to check-in on your health, discuss who you have been around, and ask where you spent time while you may have been able to spread COVID-19 to others. This form can help you think about people you have recently been around so you will be ready if a public health worker calls you. Things to think about. Have you:  Gone to work or school?  Gotten together with others (eaten out at a restaurant, gone out for drinks, exercised with others or gone to a gym, had friends or family over to your house, volunteered, gone to a party, pool, or park)?  Gone to a store in person (e.g., grocery store, mall)?  Gone to in-person appointments (e.g., salon, barber, doctor's or dentist's office)?  Ridden in a car with others (e.g., rideshare) or   taken public transportation?  Been inside a church, synagogue, mosque or other places of worship? Who lives with you?  ______________________________________________________________________  ______________________________________________________________________  ______________________________________________________________________  ______________________________________________________________________ Who have you been around (less than 6 feet for a total of 15 minutes or more) in the  last 10 days? (You may have more people to list than the space provided. If so, write on the front of this sheet or a separate piece of paper.) Name ______________________________________________  Phone number ____________________________________  Date you last saw them _____________________________  Where you last saw them ________________________________________________ Name ______________________________________________  Phone number ____________________________________  Date you last saw them _____________________________  Where you last saw them ________________________________________________ Name ______________________________________________  Phone number ____________________________________  Date you last saw them _____________________________  Where you last saw them ________________________________________________ Name ______________________________________________  Phone number ____________________________________  Date you last saw them _____________________________  Where you last saw them ________________________________________________ Name ______________________________________________  Phone number ____________________________________  Date you last saw them _____________________________  Where you last saw them ________________________________________________ What have you done in the last 10 days with other people? Activity _____________________________________________  Location _________________________________________  Date ____________________________________________ Activity _____________________________________________  Location _________________________________________  Date ____________________________________________ Activity _____________________________________________  Location _________________________________________  Date ____________________________________________ Activity _____________________________________________  Location  _________________________________________  Date ____________________________________________ Activity _____________________________________________  Location _________________________________________  Date ____________________________________________ Centers for Disease Control and Prevention cdc.gov/coronavirus 08/09/2019 This information is not intended to replace advice given to you by your health care provider. Make sure you discuss any questions you have with your health care provider. Document Revised: 12/02/2019 Document Reviewed: 12/02/2019 Elsevier Patient Education  2021 [employer] Inc.  

## 2020-03-04 NOTE — Assessment & Plan Note (Signed)
Chronic, ongoing.  Followed by endocrinology.  Continue current medication regimen as prescribed by them and review notes.  New supplies for monitoring sent in.  Recommend he check BS 2-3 times daily and document for visits.  Eye provider referral placed.  Return as scheduled.

## 2020-03-04 NOTE — Progress Notes (Signed)
BP 119/72   Pulse 88   Temp 98.9 F (37.2 C) (Oral)   Ht 5' 6.73" (1.695 m)   Wt 210 lb 8 oz (95.5 kg)   SpO2 92%   BMI 33.23 kg/m    Subjective:    Patient ID: Corey [name], male    DOB: 06/11/1955, 65 y.o.   MRN: [id number]  HPI: Corey [name] is a 65 y.o. male  Chief Complaint  Patient presents with  . referral  . Medication Refill  . Nasal Congestion    Started other day, sinus pressure, facial pain, cough, no exposure to covid   DIABETES Recent A1c in December was 8.3, followed by endo for diabetes -- just saw on 03/02/20.  Continues on Metformin 1000 MG BID, Ozempic 1 MG weekly, Lantus 90 units, and Jardiance 25 MG.  In need of referral for diabetic eye exam.    Needs refill on Tizanidine for back pain today, has been taking as needed for several years -- offers benefit.  Is followed by pain clinic, thinks Dr. Welton Flakes. Hypoglycemic episodes:no Polydipsia/polyuria: no Visual disturbance: no Chest pain: no Paresthesias: no Glucose Monitoring: no  Accucheck frequency: Not Checking  Fasting glucose: meter no working  Post prandial:  Evening:  Before meals: Taking Insulin?: yes  Long acting insulin: Lantus 90 units  Short acting insulin: Blood Pressure Monitoring: not checking Retinal Examination:Not  Up to Date -- ordered today Foot Exam: Up to Date Pneumovax: Up to Date Influenza: Up to Date Aspirin: yes   UPPER RESPIRATORY TRACT INFECTION Started with symptoms 2 days ago -- started with runny nose.  Underlying asthma present.  Non smoker, quit years ago.  Denies loss of taste or smell.  Has not been Covid vaccinated. Fever: no Cough: baseline at this time Shortness of breath: no Wheezing: no Chest pain: no Chest tightness: no Chest congestion: no Nasal congestion: yes Runny nose: yes Post nasal drip: yes Sneezing: no Sore throat: no Swollen glands: no Sinus pressure: yes Headache: no Face pain: no Toothache: no Ear pain: none Ear pressure:  none Eyes red/itching:no Eye drainage/crusting: no  Vomiting: no Rash: no Fatigue: yes Sick contacts: no Strep contacts: no  Context: stable Recurrent sinusitis: no Relief with OTC cold/cough medications: yes  Treatments attempted: Flonase   Relevant past medical, surgical, family and social history reviewed and updated as indicated. Interim medical history since our last visit reviewed. Allergies and medications reviewed and updated.  Review of Systems  Constitutional: Positive for fatigue. Negative for activity change, diaphoresis and fever.  HENT: Positive for congestion, postnasal drip and rhinorrhea. Negative for ear discharge, ear pain, sinus pressure, sinus pain, sneezing, sore throat and voice change.   Respiratory: Positive for cough. Negative for chest tightness, shortness of breath and wheezing.   Cardiovascular: Negative for chest pain, palpitations and leg swelling.  Gastrointestinal: Negative.   Endocrine: Negative for cold intolerance, heat intolerance, polydipsia, polyphagia and polyuria.  Musculoskeletal: Negative for myalgias.  Neurological: Negative.   Psychiatric/Behavioral: Negative.     Per HPI unless specifically indicated above     Objective:    BP 119/72   Pulse 88   Temp 98.9 F (37.2 C) (Oral)   Ht 5' 6.73" (1.695 m)   Wt 210 lb 8 oz (95.5 kg)   SpO2 92%   BMI 33.23 kg/m   Wt Readings from Last 3 Encounters:  03/04/20 210 lb 8 oz (95.5 kg)  02/24/20 204 lb 8 oz (92.8 kg)  01/11/20 212  lb 1.3 oz (96.2 kg)    Physical Exam Vitals and nursing note reviewed.  Constitutional:      General: He is awake. He is not in acute distress.    Appearance: He is well-developed and well-groomed. He is obese. He is not ill-appearing.  HENT:     Head: Normocephalic and atraumatic.     Right Ear: Hearing, ear canal and external ear normal. No drainage. A middle ear effusion is present.     Left Ear: Hearing, ear canal and external ear normal. No drainage.  A middle ear effusion is present.     Nose: Rhinorrhea present. Rhinorrhea is clear.     Right Sinus: No maxillary sinus tenderness or frontal sinus tenderness.     Left Sinus: No maxillary sinus tenderness or frontal sinus tenderness.     Mouth/Throat:     Pharynx: Oropharynx is clear. Uvula midline. No posterior oropharyngeal erythema.  Eyes:     General: Lids are normal.        Right eye: No discharge.        Left eye: No discharge.     Conjunctiva/sclera: Conjunctivae normal.     Pupils: Pupils are equal, round, and reactive to light.  Neck:     Thyroid: No thyromegaly.     Vascular: No carotid bruit.     Trachea: Trachea normal.  Cardiovascular:     Rate and Rhythm: Normal rate and regular rhythm.     Heart sounds: Normal heart sounds, S1 normal and S2 normal. No murmur heard. No gallop.   Pulmonary:     Effort: Pulmonary effort is normal. No accessory muscle usage or respiratory distress.     Breath sounds: Normal breath sounds.  Abdominal:     General: Bowel sounds are normal.     Palpations: Abdomen is soft. There is no hepatomegaly or splenomegaly.  Musculoskeletal:        General: Normal range of motion.     Cervical back: Normal range of motion and neck supple.     Right lower leg: No edema.     Left lower leg: No edema.  Lymphadenopathy:     Head:     Right side of head: No submental, submandibular, tonsillar, preauricular or posterior auricular adenopathy.     Left side of head: No submental, submandibular, tonsillar, preauricular or posterior auricular adenopathy.     Cervical: No cervical adenopathy.  Skin:    General: Skin is warm and dry.     Capillary Refill: Capillary refill takes less than 2 seconds.  Neurological:     Mental Status: He is alert and oriented to person, place, and time.  Psychiatric:        Attention and Perception: Attention normal.        Mood and Affect: Mood normal.        Speech: Speech normal.        Behavior: Behavior normal.  Behavior is cooperative.        Thought Content: Thought content normal.     Results for orders placed or performed in visit on 02/24/20  CBC w/Diff  Result Value Ref Range   WBC 10.8 3.4 - 10.8 x10E3/uL   RBC 6.08 (H) 4.14 - 5.80 x10E6/uL   Hemoglobin 19.2 (H) 13.0 - 17.7 g/dL   Hematocrit 18.2 (H) 99.3 - 51.0 %   MCV 94 79 - 97 fL   MCH 31.6 26.6 - 33.0 pg   MCHC 33.5 31.5 - 35.7 g/dL  RDW 17.0 (H) 11.6 - 15.4 %   Platelets 193 150 - 450 x10E3/uL   Neutrophils 71 Not Estab. %   Lymphs 16 Not Estab. %   Monocytes 8 Not Estab. %   Eos 3 Not Estab. %   Basos 1 Not Estab. %   Neutrophils Absolute 7.7 (H) 1.4 - 7.0 x10E3/uL   Lymphocytes Absolute 1.8 0.7 - 3.1 x10E3/uL   Monocytes Absolute 0.9 0.1 - 0.9 x10E3/uL   EOS (ABSOLUTE) 0.3 0.0 - 0.4 x10E3/uL   Basophils Absolute 0.1 0.0 - 0.2 x10E3/uL   Immature Granulocytes 1 Not Estab. %   Immature Grans (Abs) 0.1 0.0 - 0.1 x10E3/uL  Lipid Profile  Result Value Ref Range   Cholesterol, Total 176 100 - 199 mg/dL   Triglycerides 188 (HH) 0 - 149 mg/dL   HDL 28 (L) >41 mg/dL   VLDL Cholesterol Cal 108 (H) 5 - 40 mg/dL   LDL Chol Calc (NIH) 40 0 - 99 mg/dL   Chol/HDL Ratio 6.3 (H) 0.0 - 5.0 ratio  PSA  Result Value Ref Range   Prostate Specific Ag, Serum 0.3 0.0 - 4.0 ng/mL  Comprehensive metabolic panel  Result Value Ref Range   Glucose 220 (H) 65 - 99 mg/dL   BUN 18 8 - 27 mg/dL   Creatinine, Ser 6.60 (H) 0.76 - 1.27 mg/dL   GFR calc non Af Amer 55 (L) >59 mL/min/1.73   GFR calc Af Amer 63 >59 mL/min/1.73   BUN/Creatinine Ratio 13 10 - 24   Sodium 140 134 - 144 mmol/L   Potassium 4.1 3.5 - 5.2 mmol/L   Chloride 95 (L) 96 - 106 mmol/L   CO2 18 (L) 20 - 29 mmol/L   Calcium 10.1 8.6 - 10.2 mg/dL   Total Protein 7.8 6.0 - 8.5 g/dL   Albumin 4.8 3.8 - 4.8 g/dL   Globulin, Total 3.0 1.5 - 4.5 g/dL   Albumin/Globulin Ratio 1.6 1.2 - 2.2   Bilirubin Total 0.7 0.0 - 1.2 mg/dL   Alkaline Phosphatase 143 (H) 44 - 121 IU/L   AST  20 0 - 40 IU/L   ALT 32 0 - 44 IU/L      Assessment & Plan:   Problem List Items Addressed This Visit      Respiratory   Sinus congestion    Acute x 2 days in higher risk non vaccinated patient.  Obtain Covid testing and flu test today.  Will send in script for Prednisone burst (is aware this may increase BS for a few days and to monitor closely, stop if BS >350).  Recommend he obtain OTC Claritin or Allegra for relief.  Avoid Ibuprofen use.   I would recommend starting Vitamin C 500 MG twice a day, Quercetin 250-500 MG twice a day, Zinc 75-100 MG daily, and Vitamin D3 2000-4000 units daily to help.  You may also use over the counter cold medications for symptom relief.  Ensure good hydration and rest.  Tylenol for fever if presents.  Stay quarantined until test results return.  Return for worsening or ongoing symptoms.      Relevant Orders   Veritor Flu A/B Waived   Novel Coronavirus, NAA ([facility])     Endocrine   Type 2 diabetes mellitus with hyperglycemia, with long-term current use of insulin (HCC) - Primary    Chronic, ongoing.  Followed by endocrinology.  Continue current medication regimen as prescribed by them and review notes.  New supplies for monitoring sent in.  Recommend he check BS 2-3 times daily and document for visits.  Eye provider referral placed.  Return as scheduled.      Relevant Orders   Ambulatory referral to Ophthalmology     Other   Chronic pain syndrome    Chronic, ongoing.  Followed by pain clinic.  Continue this collaboration.  Refills on Tizanidine sent today.  No opioids in office.      Relevant Medications   tiZANidine (ZANAFLEX) 4 MG tablet       Follow up plan: Return for as scheduled in upcoming months.

## 2020-03-04 NOTE — Assessment & Plan Note (Signed)
Acute x 2 days in higher risk non vaccinated patient.  Obtain Covid testing and flu test today.  Will send in script for Prednisone burst (is aware this may increase BS for a few days and to monitor closely, stop if BS >350).  Recommend he obtain OTC Claritin or Allegra for relief.  Avoid Ibuprofen use.   I would recommend starting Vitamin C 500 MG twice a day, Quercetin 250-500 MG twice a day, Zinc 75-100 MG daily, and Vitamin D3 2000-4000 units daily to help.  You may also use over the counter cold medications for symptom relief.  Ensure good hydration and rest.  Tylenol for fever if presents.  Stay quarantined until test results return.  Return for worsening or ongoing symptoms.

## 2020-03-04 NOTE — Assessment & Plan Note (Signed)
Chronic, ongoing.  Followed by pain clinic.  Continue this collaboration.  Refills on Tizanidine sent today.  No opioids in office.

## 2020-03-06 LAB — SARS-COV-2, NAA 2 DAY TAT

## 2020-03-06 LAB — NOVEL CORONAVIRUS, NAA: SARS-CoV-2, NAA: NOT DETECTED

## 2020-03-09 ENCOUNTER — Telehealth: Payer: Self-pay

## 2020-03-09 NOTE — Chronic Care Management (AMB) (Signed)
  Care Management   Note  03/09/2020 Name: Corey [name] MRN: [id number] DOB: 1955-11-11  Corey [name] is a 65 y.o. year old male who is a primary care patient of [name], NP and is actively engaged with the care management team. I reached out to [name] by phone today to assist with re-scheduling a follow up visit with the RN Case Manager  Follow up plan: Unsuccessful telephone outreach attempt made. A HIPAA compliant phone message was left for the patient providing contact information and requesting a return call.  The care management team will reach out to the patient again over the next 7 days.  If patient returns call to provider office, please advise to call Embedded Care Management Care Guide Penne Lash  at [phone]  Penne Lash, RMA Care Guide, Embedded Care Coordination [facility]  [place], [place] [address] Direct Dial: [phone] [email or web address]: [email or web address]

## 2020-03-10 ENCOUNTER — Other Ambulatory Visit: Payer: Self-pay

## 2020-03-10 ENCOUNTER — Encounter: Payer: Self-pay | Admitting: Podiatry

## 2020-03-10 ENCOUNTER — Ambulatory Visit (INDEPENDENT_AMBULATORY_CARE_PROVIDER_SITE_OTHER): Payer: Medicare Other | Admitting: Podiatry

## 2020-03-10 DIAGNOSIS — B351 Tinea unguium: Secondary | ICD-10-CM | POA: Diagnosis not present

## 2020-03-10 DIAGNOSIS — M79674 Pain in right toe(s): Secondary | ICD-10-CM | POA: Diagnosis not present

## 2020-03-10 DIAGNOSIS — E1159 Type 2 diabetes mellitus with other circulatory complications: Secondary | ICD-10-CM

## 2020-03-10 DIAGNOSIS — M79675 Pain in left toe(s): Secondary | ICD-10-CM | POA: Diagnosis not present

## 2020-03-10 NOTE — Progress Notes (Signed)
  Subjective:  Patient ID: Corey [name], male    DOB: Nov 01, 1955,  MRN: [id number]  Chief Complaint  Patient presents with  . Nail Problem  . Diabetes  . Callouses    Nail and callous trim, DFC   65 y.o. male returns for the above complaint.  Patient presents with thickened elongated dystrophic toenails x10.  Mild pain on palpation.  Patient would like to have them debrided down.  There is mycotic nature to it.  Patient is a diabetic with last A1c of 8%.  He denies any other acute complaints.  He states that he occasionally gets neuropathy.  No additional complaints  Objective:  There were no vitals filed for this visit. Podiatric Exam: Vascular: dorsalis pedis and posterior tibial pulses are palpable bilateral. Capillary return is immediate. Temperature gradient is WNL. Skin turgor WNL  Sensorium: Decreased Semmes Weinstein monofilament test.  Decreased tactile sensation bilaterally. Nail Exam: Pt has thick disfigured discolored nails with subungual debris noted bilateral entire nail hallux through fifth toenails.  Pain on palpation to the nails. Ulcer Exam: There is no evidence of ulcer or pre-ulcerative changes or infection. Orthopedic Exam: Muscle tone and strength are WNL. No limitations in general ROM. No crepitus or effusions noted. HAV  B/L.  Hammer toes 2-5  B/L. Skin: No Porokeratosis. No infection or ulcers    Assessment & Plan:   1. Type 2 diabetes mellitus with vascular disease (HCC)   2. Pain due to onychomycosis of toenails of both feet     Patient was evaluated and treated and all questions answered.  Onychomycosis with pain  -Nails palliatively debrided as below. -Educated on self-care  Procedure: Nail Debridement Rationale: pain  Type of Debridement: manual, sharp debridement. Instrumentation: Nail nipper, rotary burr. Number of Nails: 10  Procedures and Treatment: Consent by patient was obtained for treatment procedures. The patient understood the  discussion of treatment and procedures well. All questions were answered thoroughly reviewed. Debridement of mycotic and hypertrophic toenails, 1 through 5 bilateral and clearing of subungual debris. No ulceration, no infection noted.  Return Visit-Office Procedure: Patient instructed to return to the office for a follow up visit 3 months for continued evaluation and treatment.  Nicholes Rough, DPM    No follow-ups on file.

## 2020-03-12 ENCOUNTER — Ambulatory Visit
Payer: Medicare Other | Attending: Student in an Organized Health Care Education/Training Program | Admitting: Student in an Organized Health Care Education/Training Program

## 2020-03-12 ENCOUNTER — Other Ambulatory Visit: Payer: Self-pay

## 2020-03-12 ENCOUNTER — Encounter: Payer: Self-pay | Admitting: *Deleted

## 2020-03-12 VITALS — BP 125/65 | HR 90 | Temp 96.8°F | Resp 18 | Ht 68.0 in | Wt 214.0 lb

## 2020-03-12 DIAGNOSIS — G8929 Other chronic pain: Secondary | ICD-10-CM | POA: Insufficient documentation

## 2020-03-12 DIAGNOSIS — G894 Chronic pain syndrome: Secondary | ICD-10-CM | POA: Insufficient documentation

## 2020-03-12 DIAGNOSIS — M5442 Lumbago with sciatica, left side: Secondary | ICD-10-CM | POA: Diagnosis not present

## 2020-03-12 DIAGNOSIS — E1141 Type 2 diabetes mellitus with diabetic mononeuropathy: Secondary | ICD-10-CM | POA: Insufficient documentation

## 2020-03-12 DIAGNOSIS — M170 Bilateral primary osteoarthritis of knee: Secondary | ICD-10-CM | POA: Insufficient documentation

## 2020-03-12 DIAGNOSIS — M5441 Lumbago with sciatica, right side: Secondary | ICD-10-CM | POA: Diagnosis present

## 2020-03-12 DIAGNOSIS — M47816 Spondylosis without myelopathy or radiculopathy, lumbar region: Secondary | ICD-10-CM | POA: Insufficient documentation

## 2020-03-12 DIAGNOSIS — M533 Sacrococcygeal disorders, not elsewhere classified: Secondary | ICD-10-CM | POA: Diagnosis present

## 2020-03-12 DIAGNOSIS — M5136 Other intervertebral disc degeneration, lumbar region: Secondary | ICD-10-CM | POA: Diagnosis present

## 2020-03-12 MED ORDER — PREGABALIN 150 MG PO CAPS
150.0000 mg | ORAL_CAPSULE | Freq: Two times a day (BID) | ORAL | 5 refills | Status: DC
Start: 2020-03-12 — End: 2020-09-08

## 2020-03-12 MED ORDER — TIZANIDINE HCL 4 MG PO TABS: 4.0000 mg | ORAL_TABLET | Freq: Three times a day (TID) | ORAL | 5 refills | Status: DC | PRN

## 2020-03-12 NOTE — Progress Notes (Signed)
PROVIDER NOTE: Information contained herein reflects review and annotations entered in association with encounter. Interpretation of such information and data should be left to medically-trained personnel. Information provided to patient can be located elsewhere in the medical record under "Patient Instructions". Document created using STT-dictation technology, any transcriptional errors that may result from process are unintentional.    Patient: Corey [name]  Service Category: E/M  Provider: Gillis Santa, MD  DOB: 02-25-1955  DOS: 03/12/2020  Specialty: Interventional Pain Management  MRN: [id number]  Setting: Ambulatory outpatient  PCP: Jon Billings, NP  Type: Established Patient    Referring Provider: Volney American,*  Location: Office  Delivery: Face-to-face     HPI  Corey [name], a 65 y.o. year old male, is here today because of his Lumbar degenerative disc disease [M51.36]. Corey [name] primary complain today is Back Pain (lower) Last encounter: My last encounter with him was on 09/17/2019. Pertinent problems: Corey [name] has Hyperlipidemia associated with type 2 diabetes mellitus ([place]); RLS (restless legs syndrome); GERD (gastroesophageal reflux disease); Chronic bilateral low back pain with bilateral sciatica; Lumbar degenerative disc disease; Lumbar facet arthropathy; Sacroiliac joint pain; Chronic pain syndrome; and Bilateral primary osteoarthritis of knee on their pertinent problem list. Pain Assessment: Severity of Chronic pain is reported as a 2 /10. Location: Back Right,Left,Lower/denies. Onset: More than a month ago. Quality: Aching. Timing: Intermittent. Modifying factor(s): meds. Vitals:  height is 5' 8"  (1.727 m) and weight is 214 lb (97.1 kg). His temporal temperature is 96.8 F (36 C) (abnormal). His blood pressure is 125/65 and his pulse is 90. His respiration is 18 and oxygen saturation is 97%.   Reason for encounter: medication management.    No change  in medical history since last visit.  Patient's pain is at baseline.  Patient continues multimodal pain regimen as prescribed.  States that it provides pain relief and improvement in functional status.    ROS  Constitutional: Denies any fever or chills Gastrointestinal: No reported hemesis, hematochezia, vomiting, or acute GI distress Musculoskeletal: +LBP Neurological: No reported episodes of acute onset apraxia, aphasia, dysarthria, agnosia, amnesia, paralysis, loss of coordination, or loss of consciousness  Medication Review  Accu-Chek FastClix Lancet, Fluticasone-Salmeterol, Insulin Pen Needle, NEEDLE (DISP) 18 G, OneTouch Verio, SYRINGE-NEEDLE (DISP) 3 ML, Semaglutide (1 MG/DOSE), Vitamin D3, acetaminophen, albuterol, amitriptyline, aspirin EC, azelastine, buPROPion, cetirizine, citalopram, clopidogrel, diclofenac sodium, empagliflozin, famotidine, furosemide, glucose blood, insulin glargine, isosorbide mononitrate, losartan, magnesium gluconate, meloxicam, metFORMIN, metoprolol succinate, montelukast, onetouch ultrasoft, pantoprazole, pregabalin, primidone, rosuvastatin, testosterone cypionate, and tiZANidine  History Review  Allergy: Corey [name] has No Known Allergies. Drug: Corey [name]  reports no history of drug use. Alcohol:  reports previous alcohol use. Tobacco:  reports that he quit smoking about 40 years ago. His smoking use included cigarettes. He has a 1.00 pack-year smoking history. His smokeless tobacco use includes chew. Social: Corey [name]  reports that he quit smoking about 40 years ago. His smoking use included cigarettes. He has a 1.00 pack-year smoking history. His smokeless tobacco use includes chew. He reports previous alcohol use. He reports that he does not use drugs. Medical:  has a past medical history of Asthma, Diabetes ([place]), GERD (gastroesophageal reflux disease), Hyperlipidemia, Hypertension, and Legg-Perthes disease. Surgical: Corey [name]  has a past surgical  history that includes Hip surgery; Foot Surgery; RIGHT/LEFT HEART CATH AND CORONARY ANGIOGRAPHY (N/A, 07/25/2018); Cardiac catheterization; Colonoscopy with propofol (N/A, 04/16/2019); Esophagogastroduodenoscopy (egd) with propofol (N/A, 04/16/2019); Colonoscopy with propofol (N/A, 06/14/2019); and Colonoscopy  with propofol (N/A, 07/30/2019). Family: family history includes Cancer in his mother; Diabetes in his father; Heart disease in his mother.  Laboratory Chemistry Profile   Renal Lab Results  Component Value Date   BUN 18 02/24/2020   CREATININE 1.37 (H) 02/24/2020   BCR 13 02/24/2020   GFRAA 63 02/24/2020   GFRNONAA 55 (L) 02/24/2020     Hepatic Lab Results  Component Value Date   AST 20 02/24/2020   ALT 32 02/24/2020   ALBUMIN 4.8 02/24/2020   ALKPHOS 143 (H) 02/24/2020   LIPASE 18 01/11/2020     Electrolytes Lab Results  Component Value Date   NA 140 02/24/2020   K 4.1 02/24/2020   CL 95 (L) 02/24/2020   CALCIUM 10.1 02/24/2020   MG 2.2 01/14/2020     Bone Lab Results  Component Value Date   TESTOFREE 4.2 (L) 06/24/2019   TESTOSTERONE 422 02/11/2020     Inflammation (CRP: Acute Phase) (ESR: Chronic Phase) Lab Results  Component Value Date   CRP 7.3 (H) 01/14/2020       Note: Above Lab results reviewed.  Recent Imaging Review  CT Angio Chest PE W and/or Wo Contrast CLINICAL DATA:  Headache and vomiting for 2 days, short of breath  EXAM: CT ANGIOGRAPHY CHEST WITH CONTRAST  TECHNIQUE: Multidetector CT imaging of the chest was performed using the standard protocol during bolus administration of intravenous contrast. Multiplanar CT image reconstructions and MIPs were obtained to evaluate the vascular anatomy.  CONTRAST:  138m OMNIPAQUE IOHEXOL 350 MG/ML SOLN  COMPARISON:  Chest x-ray 01/11/2020  FINDINGS: Cardiovascular: Satisfactory opacification of the pulmonary arteries to the segmental level. No evidence of pulmonary embolism. The main pulmonary  artery is normal in caliber. Normal heart size. No significant pericardial effusion. The thoracic aorta is normal in caliber. Mild atherosclerotic plaque of the thoracic aorta. Moderate left anterior descending coronary artery calcifications and mild right and left circumflex coronary artery calcifications.  Mediastinum/Nodes: Bilateral 1 cm hilar lymph nodes. No enlarged mediastinal or axillary lymph nodes. Thyroid gland, trachea, and esophagus demonstrate no significant findings.  Lungs/Pleura: Diffuse ground-glass airspace opacities. No pleural effusion. No pneumothorax.  Upper Abdomen: A 2.1 cm hepatic lobe fluid density lesion likely represents a hepatic cyst (4:70). No acute abnormality.  Musculoskeletal:  No chest wall abnormality.  No suspicious lytic or blastic osseous lesions. Cortical irregularity of the right lateral tenth rib likely represents an old healed fracture. No acute displaced fracture. Multilevel degenerative changes of the spine.  Review of the MIP images confirms the above findings.  IMPRESSION: 1. Multifocal pneumonia with findings suggestive of COVID-19 infection. Followup PA and lateral chest X-ray is recommended in 3-4 weeks following therapy to ensure resolution and exclude underlying malignancy. 2. Bilateral hilar lymphadenopathy likely reactive in etiology. 3. Mild to moderate three-vessel coronary artery calcifications. 4.  Aortic Atherosclerosis (ICD10-I70.0).  Electronically Signed   By: MIven FinnM.D.   On: 01/11/2020 18:40 DG Chest 1 View CLINICAL DATA:  Shortness of breath.  EXAM: CHEST  1 VIEW  COMPARISON:  April 05, 2018.  FINDINGS: The heart size and mediastinal contours are within normal limits. No pneumothorax or pleural effusion is noted. Right midlung and basilar opacities are noted concerning for pneumonia. Minimal left basilar subsegmental atelectasis is noted. The visualized skeletal structures are  unremarkable.  IMPRESSION: Right midlung and basilar opacities are noted concerning for pneumonia. Minimal left basilar subsegmental atelectasis.  Electronically Signed   By: JBobbe MedicoD.  On: 01/11/2020 16:49 Note: Reviewed        Physical Exam  General appearance: Well nourished, well developed, and well hydrated. In no apparent acute distress Mental status: Alert, oriented x 3 (person, place, & time)       Respiratory: No evidence of acute respiratory distress Eyes: PERLA Vitals: BP 125/65   Pulse 90   Temp (!) 96.8 F (36 C) (Temporal)   Resp 18   Ht 5' 8"  (1.727 m)   Wt 214 lb (97.1 kg)   SpO2 97%   BMI 32.54 kg/m  BMI: Estimated body mass index is 32.54 kg/m as calculated from the following:   Height as of this encounter: 5' 8"  (1.727 m).   Weight as of this encounter: 214 lb (97.1 kg). Ideal: Ideal body weight: 68.4 kg (150 lb 12.7 oz) Adjusted ideal body weight: 79.9 kg (176 lb 1.2 oz)  Lumbar Spine Area Exam  Skin & Axial Inspection: No masses, redness, or swelling Alignment: Symmetrical Functional ROM: Unrestricted ROM       Stability: No instability detected Muscle Tone/Strength: Functionally intact. No obvious neuro-muscular anomalies detected. Sensory (Neurological): Musculoskeletal pain pattern    Assessment   Status Diagnosis  Controlled Controlled Controlled 1. Lumbar degenerative disc disease   2. Diabetic mononeuropathy associated with type 2 diabetes mellitus ([place])   3. Chronic bilateral low back pain with bilateral sciatica   4. Sacroiliac joint pain   5. Lumbar facet arthropathy   6. Bilateral primary osteoarthritis of knee   7. Chronic pain syndrome       Plan of Care  Corey [name] has a current medication list which includes the following long-term medication(s): albuterol, albuterol, amitriptyline, azelastine, bupropion, cetirizine, citalopram, famotidine, furosemide, isosorbide mononitrate, lantus solostar, losartan,  metformin, metoprolol succinate, montelukast, pantoprazole, pregabalin, primidone, rosuvastatin, testosterone cypionate, and wixela inhub.  Pharmacotherapy (Medications Ordered): Meds ordered this encounter  Medications  . pregabalin (LYRICA) 150 MG capsule    Sig: Take 1 capsule (150 mg total) by mouth 2 (two) times daily.    Dispense:  60 capsule    Refill:  5    Fill one day early if pharmacy is closed on scheduled refill date. May substitute for generic if available.  Marland Kitchen tiZANidine (ZANAFLEX) 4 MG tablet    Sig: Take 1 tablet (4 mg total) by mouth every 8 (eight) hours as needed for muscle spasms.    Dispense:  60 tablet    Refill:  5   Follow-up plan:   Return in about 6 months (around 09/09/2020) for Medication Management, in person.   Recent Visits No visits were found meeting these conditions. Showing recent visits within past 90 days and meeting all other requirements Today's Visits Date Type Provider Dept  03/12/20 Office Visit Gillis Santa, MD [facility]  Showing today's visits and meeting all other requirements Future Appointments No visits were found meeting these conditions. Showing future appointments within next 90 days and meeting all other requirements  I discussed the assessment and treatment plan with the patient. The patient was provided an opportunity to ask questions and all were answered. The patient agreed with the plan and demonstrated an understanding of the instructions.  Patient advised to call back or seek an in-person evaluation if the symptoms or condition worsens.  Duration of encounter: 20 minutes.  Note by: Gillis Santa, MD Date: 03/12/2020; Time: 12:40 PM

## 2020-03-12 NOTE — Progress Notes (Signed)
Safety precautions to be maintained throughout the outpatient stay will include: orient to surroundings, keep bed in low position, maintain call bell within reach at all times, provide assistance with transfer out of bed and ambulation.  

## 2020-03-14 ENCOUNTER — Other Ambulatory Visit: Payer: Self-pay | Admitting: Family Medicine

## 2020-03-14 DIAGNOSIS — F3341 Major depressive disorder, recurrent, in partial remission: Secondary | ICD-10-CM

## 2020-03-17 ENCOUNTER — Telehealth: Payer: Self-pay

## 2020-03-17 DIAGNOSIS — E1141 Type 2 diabetes mellitus with diabetic mononeuropathy: Secondary | ICD-10-CM

## 2020-03-17 MED ORDER — ROSUVASTATIN CALCIUM 20 MG PO TABS: 20.0000 mg | ORAL_TABLET | Freq: Every day | ORAL | 1 refills | Status: DC

## 2020-03-17 NOTE — Telephone Encounter (Signed)
Refill request from [facility] for Rosuvastatin 20 mg, QD, QTY 90  Last ov 03/12/20 Next ov 05/25/20 with [name]  Last refill was 10/24/19

## 2020-03-22 ENCOUNTER — Other Ambulatory Visit: Payer: Self-pay | Admitting: Family Medicine

## 2020-03-22 DIAGNOSIS — E1141 Type 2 diabetes mellitus with diabetic mononeuropathy: Secondary | ICD-10-CM

## 2020-03-23 ENCOUNTER — Other Ambulatory Visit: Payer: Self-pay | Admitting: Family Medicine

## 2020-03-25 NOTE — Chronic Care Management (AMB) (Signed)
  Care Management   Note  03/25/2020 Name: Corey [name] MRN: [id number] DOB: January 22, 1956  Corey [name] is a 65 y.o. year old male who is a primary care patient of [name], NP and is actively engaged with the care management team. I reached out to [name] by phone today to assist with re-scheduling a follow up visit with the RN Case Manager  Follow up plan: Telephone appointment with care management team member scheduled for:04/24/2020  Penne Lash, RMA Care Guide, Embedded Care Coordination [facility]  [place], [place] [address] Direct Dial: [phone] [email or web address] Website: [email or web address]

## 2020-03-29 ENCOUNTER — Other Ambulatory Visit: Payer: Self-pay | Admitting: Student in an Organized Health Care Education/Training Program

## 2020-03-29 DIAGNOSIS — E1141 Type 2 diabetes mellitus with diabetic mononeuropathy: Secondary | ICD-10-CM

## 2020-04-10 ENCOUNTER — Other Ambulatory Visit: Payer: Self-pay | Admitting: Family Medicine

## 2020-04-10 LAB — HM DIABETES EYE EXAM

## 2020-04-20 ENCOUNTER — Other Ambulatory Visit: Payer: Self-pay | Admitting: Family Medicine

## 2020-04-20 DIAGNOSIS — Z794 Long term (current) use of insulin: Secondary | ICD-10-CM

## 2020-04-20 DIAGNOSIS — F3341 Major depressive disorder, recurrent, in partial remission: Secondary | ICD-10-CM

## 2020-04-20 DIAGNOSIS — E1165 Type 2 diabetes mellitus with hyperglycemia: Secondary | ICD-10-CM

## 2020-04-20 NOTE — Telephone Encounter (Signed)
Not a patient at [facility]. Thanks

## 2020-04-20 NOTE — Telephone Encounter (Signed)
Routing to provider  

## 2020-04-20 NOTE — Telephone Encounter (Signed)
Pt has apt on 04/28/2020 as he is not completly out of medications.

## 2020-04-20 NOTE — Telephone Encounter (Signed)
  Notes to clinic: review for continue use of medication and refill    Requested Prescriptions  Pending Prescriptions Disp Refills   meloxicam (MOBIC) 7.5 MG tablet [Pharmacy Med Name: MELOXICAM 7.5MG  TABLETS] 30 tablet 0    Sig: TAKE 1 TABLET(7.5 MG) BY MOUTH DAILY      Analgesics:  COX2 Inhibitors Failed - 04/20/2020  3:34 AM      Failed - HGB in normal range and within 360 days    Hemoglobin  Date Value Ref Range Status  02/24/2020 19.2 (H) 13.0 - 17.7 g/dL Final          Failed - Cr in normal range and within 360 days    Creatinine, Ser  Date Value Ref Range Status  02/24/2020 1.37 (H) 0.76 - 1.27 mg/dL Final          Passed - Patient is not pregnant      Passed - Valid encounter within last 12 months    Recent Outpatient Visits           1 month ago Type 2 diabetes mellitus with hyperglycemia, with long-term current use of insulin (HCC)   [facility] [place], [place] T, NP   1 month ago Routine general medical examination at a health care facility   [facility] [name], NP   10 months ago Essential hypertension   [facility] [place] Maser [place], [place]   1 year ago Chronic bilateral low back pain with bilateral sciatica   [facility] Particia [place], [place]   1 year ago Type 2 diabetes mellitus with hyperglycemia, with long-term current use of insulin [facility])   [facility] [place], Salley [place], [place]       Future Appointments             In 1 month [name], NP [facility]   In 3 months Stoioff, Verna Czech, MD [facility]   In 5 months Deirdre Evener, MD [facility]   In 7 months  [facility]

## 2020-04-20 NOTE — Telephone Encounter (Signed)
Please get patient scheduled.  °

## 2020-04-24 ENCOUNTER — Telehealth: Payer: Medicare Other

## 2020-04-25 ENCOUNTER — Other Ambulatory Visit: Payer: Self-pay | Admitting: Family Medicine

## 2020-04-25 NOTE — Telephone Encounter (Signed)
Approved per protocol.  Requested Prescriptions  Pending Prescriptions Disp Refills  . Accu-Chek FastClix Lancets MISC [Pharmacy Med Name: ACCU-CHEK FASTCLIX LANCETS 102'S] 102 each 3    Sig: USE TO CHECK BLOOD SUGAR AS DIRECTED     Endocrinology: Diabetes - Testing Supplies Passed - 04/25/2020  9:07 AM      Passed - Valid encounter within last 12 months    Recent Outpatient Visits          1 month ago Type 2 diabetes mellitus with hyperglycemia, with long-term current use of insulin (HCC)   [facility] [place], [place] T, NP   2 months ago Routine general medical examination at a health care facility   [facility] [name], NP   10 months ago Essential hypertension   [facility] Particia [place]earing, [place]   1 year ago Chronic bilateral low back pain with bilateral sciatica   [facility] Particia [place], [place]   1 year ago Type 2 diabetes mellitus with hyperglycemia, with long-term current use of insulin [facility])   [facility] Particia [place]earing, [place]      Future Appointments            In 3 days [name], NP [facility]   In 1 month [name], NP [facility]   In 3 months Stoioff, Verna Czech, MD [facility]   In 5 months Deirdre Evener, MD [facility]   In 6 months  [facility], [facility]

## 2020-04-27 NOTE — Progress Notes (Signed)
BP 131/72    Pulse 82    Temp 98.6 F (37 C)    Wt 215 lb 4 oz (97.6 kg)    SpO2 96%    BMI 32.73 kg/m    Subjective:    Patient ID: Corey [name], male    DOB: [birth year], 65 y.o.   MRN: [id number]  HPI: Corey [name] is a 65 y.o. male  Chief Complaint  Patient presents with   Rash    On face for a couple of weeks   HYPERTENSION / HYPERLIPIDEMIA Satisfied with current treatment? yes Duration of hypertension: chronic BP monitoring frequency: not checking BP range:  BP medication side effects: no Past BP meds: Losartan 25mg , Imdur 60mg  and Metoprolol 25mg . Duration of hyperlipidemia: chronic Cholesterol medication side effects: yes Cholesterol supplements: None. Past cholesterol medications: Crestor 20mg . Medication compliance: excellent compliance Aspirin: yes Recent stressors: no Recurrent headaches: no Visual changes: no Palpitations: no Dyspnea: no Chest pain: no Lower extremity edema: no Dizzy/lightheaded: no  DIABETES Hypoglycemic episodes:no Polydipsia/polyuria: no Visual disturbance: no Chest pain: no Paresthesias: no Glucose Monitoring: yes             Accucheck frequency: Daily             Fasting glucose: 150s             Post prandial: 180s Taking Insulin?: yes             Long acting insulin: Lantus 85 U daily Blood Pressure Monitoring: not checking Retinal Examination: Not up to Date Foot Exam: Up to Date  RASH Patient has a rash on his face that has been there for a couple of months.  Improved with steroid course at last visit. Patient states the rash doesn't itch at all.  Just see's it but does not bother him.   Relevant past medical, surgical, family and social history reviewed and updated as indicated. Interim medical history since our last visit reviewed. Allergies and medications reviewed and updated.  Review of Systems  Eyes: Negative for visual disturbance.  Respiratory: Negative for chest tightness and shortness of  breath.   Cardiovascular: Negative for chest pain, palpitations and leg swelling.  Endocrine: Negative for polydipsia and polyuria.  Skin: Positive for rash.  Neurological: Negative for dizziness, light-headedness, numbness and headaches.    Per HPI unless specifically indicated above     Objective:    BP 131/72    Pulse 82    Temp 98.6 F (37 C)    Wt 215 lb 4 oz (97.6 kg)    SpO2 96%    BMI 32.73 kg/m   Wt Readings from Last 3 Encounters:  04/28/20 215 lb 4 oz (97.6 kg)  03/12/20 214 lb (97.1 kg)  03/04/20 210 lb 8 oz (95.5 kg)    Physical Exam Vitals and nursing note reviewed.  Constitutional:      General: He is not in acute distress.    Appearance: Normal appearance. He is not ill-appearing, toxic-appearing or diaphoretic.  HENT:     Head: Normocephalic.     Right Ear: External ear normal.     Left Ear: External ear normal.     Nose: Nose normal. No congestion or rhinorrhea.     Mouth/Throat:     Mouth: Mucous membranes are moist.  Eyes:     General:        Right eye: No discharge.        Left eye: No discharge.  Extraocular Movements: Extraocular movements intact.     Conjunctiva/sclera: Conjunctivae normal.     Pupils: Pupils are equal, round, and reactive to light.  Cardiovascular:     Rate and Rhythm: Normal rate and regular rhythm.     Heart sounds: No murmur heard.   Pulmonary:     Effort: Pulmonary effort is normal. No respiratory distress.     Breath sounds: Normal breath sounds. No wheezing, rhonchi or rales.  Abdominal:     General: Abdomen is flat. Bowel sounds are normal.  Musculoskeletal:     Cervical back: Normal range of motion and neck supple.  Skin:    General: Skin is warm and dry.     Capillary Refill: Capillary refill takes less than 2 seconds.     Findings: Rash (red, dry, flaky macular rash on bilateral cheeks.) present.  Neurological:     General: No focal deficit present.     Mental Status: He is alert and oriented to person,  place, and time.  Psychiatric:        Mood and Affect: Mood normal.        Behavior: Behavior normal.        Thought Content: Thought content normal.        Judgment: Judgment normal.     Results for orders placed or performed in visit on 04/15/20  HM DIABETES EYE EXAM  Result Value Ref Range   HM Diabetic Eye Exam No Retinopathy No Retinopathy      Assessment & Plan:   Problem List Items Addressed This Visit      Cardiovascular and Mediastinum   Hypertension associated with type 2 diabetes mellitus (HCC)    Chronic.  Blood pressure well controlled.  Continue with current medication regimen.  Labs ordered today.  Continue to follow up with Cardiology.       Relevant Orders   Bayer DCA Hb A1c Waived   Lipid panel   Basic metabolic panel   Vitamin B12   Urinalysis, Routine w reflex microscopic   Coronary artery disease of native artery of native heart with stable angina pectoris (HCC) - Primary    Chronic.  Stable.  Continue to follow up with Cardiology.      Relevant Orders   Bayer DCA Hb A1c Waived   Lipid panel   Basic metabolic panel   Vitamin B12   Urinalysis, Routine w reflex microscopic     Endocrine   Hyperlipidemia associated with type 2 diabetes mellitus (HCC)    Chronic.  Continue with current medication regimen.  Will make further recommendations based on lab results. Labs ordered today.       Relevant Orders   Bayer DCA Hb A1c Waived   Lipid panel   Basic metabolic panel   Vitamin B12   Urinalysis, Routine w reflex microscopic   Diabetic peripheral neuropathy associated with type 2 diabetes mellitus (HCC)    Stable.  Continue to follow up with pain management.       Relevant Orders   Bayer DCA Hb A1c Waived   Lipid panel   Basic metabolic panel   Vitamin B12   Urinalysis, Routine w reflex microscopic   Type 2 diabetes mellitus with hyperglycemia, with long-term current use of insulin (HCC)    Chronic, ongoing.  Followed by endocrinology.  Labs  ordered today. Continue current medication regimen as prescribed by them and review notes.  Recommend he check BS 2-3 times daily and document for visits.  Eye exam complete.  Return in 3 months.        Other Visit Diagnoses    Rash       Recommend cream BID x 3 weeks.  If not improved will send to Dermatology. Patient has already completed oral steroids.  Discussed importance of avoiding eyes.       Follow up plan: Return in about 3 months (around 07/29/2020) for HTN, HLD, DM2 FU.

## 2020-04-28 ENCOUNTER — Ambulatory Visit (INDEPENDENT_AMBULATORY_CARE_PROVIDER_SITE_OTHER): Payer: Medicare Other | Admitting: Nurse Practitioner

## 2020-04-28 ENCOUNTER — Encounter: Payer: Self-pay | Admitting: Nurse Practitioner

## 2020-04-28 ENCOUNTER — Other Ambulatory Visit: Payer: Self-pay | Admitting: Urology

## 2020-04-28 ENCOUNTER — Other Ambulatory Visit: Payer: Self-pay

## 2020-04-28 VITALS — BP 131/72 | HR 82 | Temp 98.6°F | Wt 215.2 lb

## 2020-04-28 DIAGNOSIS — E1169 Type 2 diabetes mellitus with other specified complication: Secondary | ICD-10-CM | POA: Diagnosis not present

## 2020-04-28 DIAGNOSIS — I25118 Atherosclerotic heart disease of native coronary artery with other forms of angina pectoris: Secondary | ICD-10-CM

## 2020-04-28 DIAGNOSIS — E1165 Type 2 diabetes mellitus with hyperglycemia: Secondary | ICD-10-CM

## 2020-04-28 DIAGNOSIS — E1159 Type 2 diabetes mellitus with other circulatory complications: Secondary | ICD-10-CM

## 2020-04-28 DIAGNOSIS — E1142 Type 2 diabetes mellitus with diabetic polyneuropathy: Secondary | ICD-10-CM

## 2020-04-28 DIAGNOSIS — E785 Hyperlipidemia, unspecified: Secondary | ICD-10-CM

## 2020-04-28 DIAGNOSIS — R21 Rash and other nonspecific skin eruption: Secondary | ICD-10-CM

## 2020-04-28 DIAGNOSIS — Z794 Long term (current) use of insulin: Secondary | ICD-10-CM

## 2020-04-28 DIAGNOSIS — I152 Hypertension secondary to endocrine disorders: Secondary | ICD-10-CM

## 2020-04-28 LAB — URINALYSIS, ROUTINE W REFLEX MICROSCOPIC
Bilirubin, UA: NEGATIVE
Ketones, UA: NEGATIVE
Leukocytes,UA: NEGATIVE
Nitrite, UA: NEGATIVE
Protein,UA: NEGATIVE
RBC, UA: NEGATIVE
Specific Gravity, UA: 1.01 (ref 1.005–1.030)
Urobilinogen, Ur: 0.2 mg/dL (ref 0.2–1.0)
pH, UA: 6 (ref 5.0–7.5)

## 2020-04-28 LAB — BAYER DCA HB A1C WAIVED: HB A1C (BAYER DCA - WAIVED): 10 % — ABNORMAL HIGH (ref <7.0)

## 2020-04-28 MED ORDER — TRIAMCINOLONE ACETONIDE 0.025 % EX OINT: 1.0000 "application " | TOPICAL_OINTMENT | Freq: Two times a day (BID) | CUTANEOUS | 0 refills | Status: DC

## 2020-04-28 NOTE — Assessment & Plan Note (Signed)
Chronic.  Stable.  Continue to follow up with Cardiology.

## 2020-04-28 NOTE — Assessment & Plan Note (Signed)
Chronic, ongoing.  Followed by endocrinology.  Labs ordered today. Continue current medication regimen as prescribed by them and review notes.  Recommend he check BS 2-3 times daily and document for visits.  Eye exam complete.  Return in 3 months.

## 2020-04-28 NOTE — Assessment & Plan Note (Addendum)
Chronic.  Blood pressure well controlled.  Continue with current medication regimen.  Labs ordered today.  Continue to follow up with Cardiology.

## 2020-04-28 NOTE — Assessment & Plan Note (Signed)
Stable.  Continue to follow up with pain management.

## 2020-04-28 NOTE — Assessment & Plan Note (Signed)
Chronic.  Continue with current medication regimen.  Will make further recommendations based on lab results. Labs ordered today.

## 2020-04-29 ENCOUNTER — Telehealth: Payer: Self-pay | Admitting: Nurse Practitioner

## 2020-04-29 LAB — LIPID PANEL
Chol/HDL Ratio: 4.7 ratio (ref 0.0–5.0)
Cholesterol, Total: 146 mg/dL (ref 100–199)
HDL: 31 mg/dL — ABNORMAL LOW (ref >39)
LDL Chol Calc (NIH): 54 mg/dL (ref 0–99)
Triglycerides: 405 mg/dL — ABNORMAL HIGH (ref 0–149)
VLDL Cholesterol Cal: 61 mg/dL — ABNORMAL HIGH (ref 5–40)

## 2020-04-29 LAB — BASIC METABOLIC PANEL
BUN/Creatinine Ratio: 15 (ref 10–24)
BUN: 16 mg/dL (ref 8–27)
CO2: 21 mmol/L (ref 20–29)
Calcium: 9.2 mg/dL (ref 8.6–10.2)
Chloride: 99 mmol/L (ref 96–106)
Creatinine, Ser: 1.09 mg/dL (ref 0.76–1.27)
Glucose: 123 mg/dL — ABNORMAL HIGH (ref 65–99)
Potassium: 3.7 mmol/L (ref 3.5–5.2)
Sodium: 142 mmol/L (ref 134–144)
eGFR: 77 mL/min/{1.73_m2} (ref >59)

## 2020-04-29 LAB — VITAMIN B12: Vitamin B-12: 1617 pg/mL — ABNORMAL HIGH (ref 232–1245)

## 2020-04-29 MED ORDER — FENOFIBRATE 145 MG PO TABS: 145.0000 mg | ORAL_TABLET | Freq: Every day | ORAL | 1 refills | Status: DC

## 2020-04-29 NOTE — Progress Notes (Signed)
Please call patient:  Hi Corey [name].  It was good to see you yesterday.  Your lab work shows that your Triglycerides have improved from prior but are still significantly elevated. I recommend patient start fenofibrate to help with this.  If you agree to starting the medication I will send it in to the pharmacy for you.  We will recheck in 3-6 months.  A1c increased to 10.0 from 9.7.  I recommend you see Endocrinology to have medications adjusted.  Also, there was some glucose in your urine, this is likely due your diabetes not being at goal. Other lab work is stable. Follow up in 3 months.

## 2020-04-29 NOTE — Telephone Encounter (Signed)
Medication sent to the pharmacy.

## 2020-04-29 NOTE — Telephone Encounter (Signed)
-----   Message from Pablo Ledger, [place] sent at 04/29/2020  1:47 PM EDT ----- Patient notified of results. Patient is ok with starting Fenofibrate and would like it sent in.

## 2020-04-30 NOTE — Telephone Encounter (Signed)
Incoming call from pt requesting refill on testosterone.

## 2020-05-06 ENCOUNTER — Other Ambulatory Visit: Payer: Self-pay | Admitting: Urology

## 2020-05-24 ENCOUNTER — Other Ambulatory Visit: Payer: Self-pay | Admitting: Nurse Practitioner

## 2020-05-24 ENCOUNTER — Other Ambulatory Visit: Payer: Self-pay | Admitting: Family Medicine

## 2020-05-24 DIAGNOSIS — Z794 Long term (current) use of insulin: Secondary | ICD-10-CM

## 2020-05-24 DIAGNOSIS — E1165 Type 2 diabetes mellitus with hyperglycemia: Secondary | ICD-10-CM

## 2020-05-24 NOTE — Telephone Encounter (Signed)
Requested Prescriptions  Pending Prescriptions Disp Refills  . metFORMIN (GLUCOPHAGE) 1000 MG tablet [Pharmacy Med Name: metFORMIN HCl 1000 MG Oral Tablet] 180 tablet 0    Sig: TAKE 1 TABLET BY MOUTH  TWICE DAILY     Endocrinology:  Diabetes - Biguanides Failed - 05/24/2020  1:40 PM      Failed - HBA1C is between 0 and 7.9 and within 180 days    Hemoglobin A1C  Date Value Ref Range Status  03/18/2019 9.3  Final   HB A1C (BAYER DCA - WAIVED)  Date Value Ref Range Status  04/28/2020 10.0 (H) <7.0 % Final    Comment:                                          Diabetic Adult            <7.0                                       Healthy Adult        4.3 - 5.7                                                           (DCCT/NGSP) American Diabetes Association's Summary of Glycemic Recommendations for Adults with Diabetes: Hemoglobin A1c <7.0%. More stringent glycemic goals (A1c <6.0%) may further reduce complications at the cost of increased risk of hypoglycemia.          Passed - Cr in normal range and within 360 days    Creatinine, Ser  Date Value Ref Range Status  04/28/2020 1.09 0.76 - 1.27 mg/dL Final         Passed - eGFR in normal range and within 360 days    GFR calc Af Amer  Date Value Ref Range Status  02/24/2020 63 >59 mL/min/1.73 Final    Comment:    **In accordance with recommendations from the NKF-ASN Task force,**   [facility] is in the process of updating its eGFR calculation to the   2021 CKD-EPI creatinine equation that estimates kidney function   without a race variable.    GFR, Estimated  Date Value Ref Range Status  01/14/2020 >60 >60 mL/min Final    Comment:    (NOTE) Calculated using the CKD-EPI Creatinine Equation (2021)    GFR calc non Af Amer  Date Value Ref Range Status  02/24/2020 55 (L) >59 mL/min/1.73 Final   eGFR  Date Value Ref Range Status  04/28/2020 77 >59 mL/min/1.73 Final         Passed - Valid encounter within last 6 months     Recent Outpatient Visits          3 weeks ago Coronary artery disease of native artery of native heart with stable angina pectoris ([place])   [facility] Jon Billings, NP   2 months ago Type 2 diabetes mellitus with hyperglycemia, with long-term current use of insulin ([place])   [place], Henrine Screws T, NP   3 months ago Routine general medical examination at a health care facility   [facility],  Santiago Glad, NP   11 months ago Essential hypertension   [facility] Merrie Roof [place], [place]   1 year ago Chronic bilateral low back pain with bilateral sciatica   [facility] Volney American, [place]      Future Appointments            In 2 months Jon Billings, NP [facility], [facility]   In 2 months Stoioff, Ronda Fairly, MD [place]   In 4 months Ralene Bathe, MD [place]   In 5 months  [facility], [place]

## 2020-05-24 NOTE — Telephone Encounter (Signed)
Requested Prescriptions  Pending Prescriptions Disp Refills  . furosemide (LASIX) 20 MG tablet [Pharmacy Med Name: FUROSEMIDE 20MG  TABLETS] 60 tablet 0    Sig: TAKE 1 TABLET(20 MG) BY MOUTH TWICE DAILY     Cardiovascular:  Diuretics - Loop Passed - 05/24/2020  2:09 PM      Passed - K in normal range and within 360 days    Potassium  Date Value Ref Range Status  04/28/2020 3.7 3.5 - 5.2 mmol/L Final         Passed - Ca in normal range and within 360 days    Calcium  Date Value Ref Range Status  04/28/2020 9.2 8.6 - 10.2 mg/dL Final         Passed - Na in normal range and within 360 days    Sodium  Date Value Ref Range Status  04/28/2020 142 134 - 144 mmol/L Final         Passed - Cr in normal range and within 360 days    Creatinine, Ser  Date Value Ref Range Status  04/28/2020 1.09 0.76 - 1.27 mg/dL Final         Passed - Last BP in normal range    BP Readings from Last 1 Encounters:  04/28/20 131/72         Passed - Valid encounter within last 6 months    Recent Outpatient Visits          3 weeks ago Coronary artery disease of native artery of native heart with stable angina pectoris (HCC)   [facility], NP   2 months ago Type 2 diabetes mellitus with hyperglycemia, with long-term current use of insulin (HCC)   [facility] Smithers, [place] T, NP   3 months ago Routine general medical examination at a health care facility   [facility], NP   11 months ago Essential hypertension   [facility], Particia Nearing   1 year ago Chronic bilateral low back pain with bilateral sciatica   [facility], [place] N[place]      Future Appointments            In 2 months [place], NP [facility], [facility]   In 2 months Stoioff, [facility], MD [facility]   In 4 months [facility], MD [facility]   In 5 months   [facility], [facility]

## 2020-05-25 ENCOUNTER — Ambulatory Visit: Payer: Medicare Other | Admitting: Nurse Practitioner

## 2020-05-26 ENCOUNTER — Other Ambulatory Visit: Payer: Self-pay | Admitting: Family Medicine

## 2020-05-26 NOTE — Telephone Encounter (Signed)
Requested Prescriptions  Pending Prescriptions Disp Refills  . meloxicam (MOBIC) 7.5 MG tablet [Pharmacy Med Name: MELOXICAM 7.5MG  TABLETS] 90 tablet 0    Sig: TAKE 1 TABLET(7.5 MG) BY MOUTH DAILY     Analgesics:  COX2 Inhibitors Failed - 05/26/2020  8:53 PM      Failed - HGB in normal range and within 360 days    Hemoglobin  Date Value Ref Range Status  02/24/2020 19.2 (H) 13.0 - 17.7 g/dL Final         Passed - Cr in normal range and within 360 days    Creatinine, Ser  Date Value Ref Range Status  04/28/2020 1.09 0.76 - 1.27 mg/dL Final         Passed - Patient is not pregnant      Passed - Valid encounter within last 12 months    Recent Outpatient Visits          4 weeks ago Coronary artery disease of native artery of native heart with stable angina pectoris (HCC)   [facility] [name], NP   2 months ago Type 2 diabetes mellitus with hyperglycemia, with long-term current use of insulin (HCC)   [facility] Betances, Rosston T, NP   3 months ago Routine general medical examination at a health care facility   [facility] [name], NP   11 months ago Essential hypertension   [facility] Particia Nearing, [place]   1 year ago Chronic bilateral low back pain with bilateral sciatica   [facility] Particia Nearing, [place]      Future Appointments            In 2 months [name], NP [facility], [facility]   In 2 months Stoioff, Verna Czech, MD [facility]   In 4 months Deirdre Evener, MD [facility]   In 5 months  [facility], [facility]

## 2020-05-29 ENCOUNTER — Telehealth: Payer: Medicare Other

## 2020-05-29 ENCOUNTER — Telehealth: Payer: Self-pay | Admitting: General Practice

## 2020-05-29 NOTE — Telephone Encounter (Signed)
  Chronic Care Management   Outreach Note  05/29/2020 Name: Corey [name] MRN: [id number] DOB: 04/21/1955  Referred by: [name], NP Reason for referral : Appointment (RNCM: Follow up for Chronic Disease Management and care coordination Needs)   An unsuccessful telephone outreach was attempted today. The patient was referred to the case management team for assistance with care management and care coordination.   Follow Up Plan: A HIPAA compliant phone message was left for the patient providing contact information and requesting a return call.   Alto Denver RN, MSN, CCM Community Care Coordinator [place]  [facility] Mobile: [phone]

## 2020-06-04 ENCOUNTER — Telehealth: Payer: Self-pay

## 2020-06-04 ENCOUNTER — Encounter: Payer: Self-pay | Admitting: Nurse Practitioner

## 2020-06-04 NOTE — Chronic Care Management (AMB) (Signed)
  Care Management   Note  06/04/2020 Name: BRYNDEN THUNE MRN: [id number] DOB: 19-May-1955  COURTEZ TWADDLE is a 65 y.o. year old male who is a primary care patient of [name], NP and is actively engaged with the care management team. I reached out to [name] by phone today to assist with re-scheduling a follow up visit with the RN Case Manager  Follow up plan: Unsuccessful telephone outreach attempt made. A HIPAA compliant phone message was left for the patient providing contact information and requesting a return call.  The care management team will reach out to the patient again over the next 7 days.  If patient returns call to provider office, please advise to call Embedded Care Management Care Guide Penne Lash  at [phone]  Penne Lash, RMA Care Guide, Embedded Care Coordination [facility]  [place], [place] [address] Direct Dial: [phone] [email or web address] Website: [email or web address]

## 2020-06-04 NOTE — Chronic Care Management (AMB) (Signed)
  Care Management   Note  06/04/2020 Name: Corey [name] MRN: [id number] DOB: [birth year]  Corey [name] is a 65 y.o. year old male who is a primary care patient of [name], NP and is actively engaged with the care management team. I reached out to [name] by phone today to assist with re-scheduling a follow up visit with the RN Case Manager  Follow up plan: Telephone appointment with care management team member scheduled for:07/21/2020  Penne Lash, RMA Care Guide, Embedded Care Coordination [facility]  [place], [place] [address] Direct Dial: [phone] [email or web address] Website: [email or web address]

## 2020-06-04 NOTE — Telephone Encounter (Signed)
Patient has been rescheduled.

## 2020-06-09 ENCOUNTER — Ambulatory Visit (INDEPENDENT_AMBULATORY_CARE_PROVIDER_SITE_OTHER): Payer: Medicare Other | Admitting: Podiatry

## 2020-06-09 ENCOUNTER — Other Ambulatory Visit: Payer: Self-pay

## 2020-06-09 ENCOUNTER — Encounter: Payer: Self-pay | Admitting: Podiatry

## 2020-06-09 DIAGNOSIS — E1159 Type 2 diabetes mellitus with other circulatory complications: Secondary | ICD-10-CM

## 2020-06-09 DIAGNOSIS — M2042 Other hammer toe(s) (acquired), left foot: Secondary | ICD-10-CM

## 2020-06-09 DIAGNOSIS — B351 Tinea unguium: Secondary | ICD-10-CM

## 2020-06-09 DIAGNOSIS — M7752 Other enthesopathy of left foot: Secondary | ICD-10-CM | POA: Diagnosis not present

## 2020-06-09 DIAGNOSIS — M2041 Other hammer toe(s) (acquired), right foot: Secondary | ICD-10-CM

## 2020-06-09 DIAGNOSIS — M7751 Other enthesopathy of right foot: Secondary | ICD-10-CM | POA: Diagnosis not present

## 2020-06-09 DIAGNOSIS — M79674 Pain in right toe(s): Secondary | ICD-10-CM | POA: Diagnosis not present

## 2020-06-09 DIAGNOSIS — M79675 Pain in left toe(s): Secondary | ICD-10-CM | POA: Diagnosis not present

## 2020-06-09 NOTE — Progress Notes (Signed)
Subjective:  Patient ID: Corey [name], male    DOB: 1955/03/13,  MRN: [id number]  Chief Complaint  Patient presents with  . Diabetes  . Nail Problem    Nail and callous trim, DFC   65 y.o. male returns for the above complaint.  Patient presents with thickened elongated dystrophic toenails x10.  Mild pain on palpation.  Patient would like to have them debrided down.  There is mycotic nature to it.  Patient is a diabetic with last A1c of 8%.  He has secondary complaint of bilateral ankle joint pain.  Patient states is painful to walk on it has gotten worse with the cold weather he denies any other acute complaints he does have history of arthritis.  He also would like to get diabetic shoes as he is a diabetic with uncontrolled A1c.  He denies any other acute complaints  Objective:  There were no vitals filed for this visit. Podiatric Exam: Vascular: dorsalis pedis and posterior tibial pulses are palpable bilateral. Capillary return is immediate. Temperature gradient is WNL. Skin turgor WNL  Sensorium: Decreased Semmes Weinstein monofilament test.  Decreased tactile sensation bilaterally. Nail Exam: Pt has thick disfigured discolored nails with subungual debris noted bilateral entire nail hallux through fifth toenails.  Pain on palpation to the nails. Ulcer Exam: There is no evidence of ulcer or pre-ulcerative changes or infection. Orthopedic Exam: Muscle tone and strength are WNL. No limitations in general ROM. No crepitus or effusions noted. HAV  B/L.  Hammer toes 2-5  B/L.  Pain with range of motion of bilateral ankle joint deep intra-articular pain noted.  No crepitus could be palpated.  No pain at the posterior tibial tendon, peroneal tendon, Achilles tendon. Skin: No Porokeratosis. No infection or ulcers    Assessment & Plan:   1. Type 2 diabetes mellitus with vascular disease (HCC)   2. Pain due to onychomycosis of toenails of both feet   3. Hammertoe, bilateral   4. Capsulitis  of right ankle   5. Capsulitis of left ankle     Patient was evaluated and treated and all questions answered.  Bilateral ankle capsulitis -I explained to the patient the etiology of ankle capsulitis and various treatment options were extensively discussed.  Given the amount of pain that is having a benefit from steroid injection both ankle joint.  Patient states understanding like to proceed with a steroid injection -A steroid injection was performed at bilateral ankle joint using 1% plain Lidocaine and 10 mg of Kenalog. This was well tolerated.   Hammertoe contractures 2 through 5 bilaterally -I explained to patient the etiology of contracture as well as treatment options were discussed.  Given that he is a uncontrolled diabetic with A1c of 10% he will benefit from diabetic shoes.  He will be scheduled to see EJ for diabetic shoes.  Onychomycosis with pain  -Nails palliatively debrided as below. -Educated on self-care  Procedure: Nail Debridement Rationale: pain  Type of Debridement: manual, sharp debridement. Instrumentation: Nail nipper, rotary burr. Number of Nails: 10  Procedures and Treatment: Consent by patient was obtained for treatment procedures. The patient understood the discussion of treatment and procedures well. All questions were answered thoroughly reviewed. Debridement of mycotic and hypertrophic toenails, 1 through 5 bilateral and clearing of subungual debris. No ulceration, no infection noted.  Return Visit-Office Procedure: Patient instructed to return to the office for a follow up visit 3 months for continued evaluation and treatment.  Nicholes Rough, DPM    No  follow-ups on file.

## 2020-06-27 ENCOUNTER — Other Ambulatory Visit: Payer: Self-pay | Admitting: Nurse Practitioner

## 2020-06-28 ENCOUNTER — Other Ambulatory Visit: Payer: Self-pay | Admitting: Nurse Practitioner

## 2020-07-06 ENCOUNTER — Other Ambulatory Visit: Payer: Self-pay | Admitting: *Deleted

## 2020-07-06 DIAGNOSIS — E291 Testicular hypofunction: Secondary | ICD-10-CM

## 2020-07-08 ENCOUNTER — Other Ambulatory Visit: Payer: Self-pay

## 2020-07-08 ENCOUNTER — Ambulatory Visit: Payer: Medicare Other

## 2020-07-19 ENCOUNTER — Other Ambulatory Visit: Payer: Self-pay | Admitting: Family Medicine

## 2020-07-19 DIAGNOSIS — F3341 Major depressive disorder, recurrent, in partial remission: Secondary | ICD-10-CM

## 2020-07-19 NOTE — Telephone Encounter (Signed)
Requested Prescriptions  Pending Prescriptions Disp Refills  . citalopram (CELEXA) 10 MG tablet [Pharmacy Med Name: Citalopram Hydrobromide 10 MG Oral Tablet] 90 tablet 1    Sig: TAKE 1 TABLET BY MOUTH  DAILY     Psychiatry:  Antidepressants - SSRI Passed - 07/19/2020  4:19 PM      Passed - Completed PHQ-2 or PHQ-9 in the last 360 days      Passed - Valid encounter within last 6 months    Recent Outpatient Visits          2 months ago Coronary artery disease of native artery of native heart with stable angina pectoris (HCC)   [facility] [name], NP   4 months ago Type 2 diabetes mellitus with hyperglycemia, with long-term current use of insulin (HCC)   [facility] [place], Murdock T, NP   4 months ago Routine general medical examination at a health care facility   [facility] [name], NP   1 year ago Essential hypertension   [facility] Particia Nearing, [place]   1 year ago Chronic bilateral low back pain with bilateral sciatica   [facility] Particia Nearing, [place]      Future Appointments            In 1 week [name], NP [facility], [facility]   In 3 weeks Stoioff, Verna Czech, MD [facility]   In 2 months Deirdre Evener, MD [facility]   In 4 months  [facility], [facility]

## 2020-07-21 ENCOUNTER — Telehealth: Payer: Medicare Other | Admitting: General Practice

## 2020-07-21 ENCOUNTER — Ambulatory Visit (INDEPENDENT_AMBULATORY_CARE_PROVIDER_SITE_OTHER): Payer: Medicare Other | Admitting: General Practice

## 2020-07-21 ENCOUNTER — Other Ambulatory Visit: Payer: Self-pay | Admitting: Nurse Practitioner

## 2020-07-21 DIAGNOSIS — Z794 Long term (current) use of insulin: Secondary | ICD-10-CM

## 2020-07-21 DIAGNOSIS — I25118 Atherosclerotic heart disease of native coronary artery with other forms of angina pectoris: Secondary | ICD-10-CM

## 2020-07-21 DIAGNOSIS — E1169 Type 2 diabetes mellitus with other specified complication: Secondary | ICD-10-CM

## 2020-07-21 DIAGNOSIS — E785 Hyperlipidemia, unspecified: Secondary | ICD-10-CM

## 2020-07-21 DIAGNOSIS — F3341 Major depressive disorder, recurrent, in partial remission: Secondary | ICD-10-CM

## 2020-07-21 DIAGNOSIS — F4321 Adjustment disorder with depressed mood: Secondary | ICD-10-CM

## 2020-07-21 DIAGNOSIS — I152 Hypertension secondary to endocrine disorders: Secondary | ICD-10-CM

## 2020-07-21 DIAGNOSIS — E1159 Type 2 diabetes mellitus with other circulatory complications: Secondary | ICD-10-CM

## 2020-07-21 DIAGNOSIS — E1165 Type 2 diabetes mellitus with hyperglycemia: Secondary | ICD-10-CM

## 2020-07-21 NOTE — Chronic Care Management (AMB) (Signed)
Chronic Care Management   CCM RN Visit Note  07/21/2020 Name: Corey [name] MRN: [id number] DOB: 1955/02/21  Subjective: Corey [name] is a 65 y.o. year old male who is a primary care patient of Jon Billings, NP. The care management team was consulted for assistance with disease management and care coordination needs.    Engaged with patient by telephone for follow up visit in response to provider referral for case management and/or care coordination services.   Consent to Services:  The patient was given information about Chronic Care Management services, agreed to services, and gave verbal consent prior to initiation of services.  Please see initial visit note for detailed documentation.   Patient agreed to services and verbal consent obtained.   Assessment: Review of patient past medical history, allergies, medications, health status, including review of consultants reports, laboratory and other test data, was performed as part of comprehensive evaluation and provision of chronic care management services.   SDOH (Social Determinants of Health) assessments and interventions performed:    CCM Care Plan  No Known Allergies  Outpatient Encounter Medications as of 07/21/2020  Medication Sig   Accu-Chek FastClix Lancets MISC USE TO CHECK BLOOD SUGAR AS DIRECTED   acetaminophen (TYLENOL) 500 MG tablet Take 1,000 mg by mouth every 6 (six) hours as needed for moderate pain or headache.   albuterol (PROVENTIL) (2.5 MG/3ML) 0.083% nebulizer solution USE 1 VIAL IN NEBULIZER EVERY 6 HOURS - and as needed.   albuterol (VENTOLIN HFA) 108 (90 Base) MCG/ACT inhaler INHALE 2 PUFFS INTO THE LUNGS EVERY 6 HOURS AS NEEDED FOR WHEEZING OR SHORTNESS OF BREATH (Patient taking differently: Inhale 2 puffs into the lungs every 6 (six) hours as needed for wheezing or shortness of breath.)   amitriptyline (ELAVIL) 50 MG tablet TAKE 1 TO 2 TABLETS(50 TO 100 MG) BY MOUTH AT BEDTIME   aspirin EC 81 MG  tablet Take 81 mg by mouth daily.   azelastine (ASTELIN) 0.1 % nasal spray USE 2 SPRAYS IN EACH NOSTRIL TWICE DAILY (Patient taking differently: Place 2 sprays into both nostrils 2 (two) times daily.)   B-D UF III MINI PEN NEEDLES 31G X 5 MM MISC USE TWICE DAILY   Blood Glucose Monitoring Suppl (ONETOUCH VERIO) w/Device KIT Use to check blood sugar 2-3 times daily and document for provider visits.   buPROPion (WELLBUTRIN XL) 150 MG 24 hr tablet TAKE 1 TABLET(150 MG) BY MOUTH DAILY   cetirizine (ZYRTEC) 10 MG tablet TAKE 1 TABLET BY MOUTH DAILY   Cholecalciferol (VITAMIN D3) 5000 units TABS Take 5,000 Units by mouth daily.    citalopram (CELEXA) 10 MG tablet TAKE 1 TABLET BY MOUTH  DAILY   clopidogrel (PLAVIX) 75 MG tablet Take 75 mg by mouth daily.   diclofenac sodium (VOLTAREN) 1 % GEL Apply 1 application topically 4 (four) times daily as needed (pain).    famotidine (PEPCID) 40 MG tablet Take 1 tablet (40 mg total) by mouth daily.   fenofibrate (TRICOR) 145 MG tablet Take 1 tablet (145 mg total) by mouth daily.   furosemide (LASIX) 20 MG tablet TAKE 1 TABLET(20 MG) BY MOUTH TWICE DAILY   glucose blood test strip Use to check blood sugar 2-3 times daily and document for provider visits.   isosorbide mononitrate (IMDUR) 60 MG 24 hr tablet Take 60 mg by mouth daily.   JARDIANCE 25 MG TABS tablet Take 1 tablet (25 mg total) by mouth daily.   Lancets Misc. (ACCU-CHEK FASTCLIX LANCET) KIT 1  Units by Does not apply route 2 (two) times daily.   LANTUS SOLOSTAR 100 UNIT/ML Solostar Pen Inject 85 Units into the skin at bedtime.   losartan (COZAAR) 50 MG tablet Take 1 tablet (50 mg total) by mouth daily.   magnesium gluconate (MAGONATE) 500 MG tablet Take 500 mg by mouth daily.   meloxicam (MOBIC) 7.5 MG tablet TAKE 1 TABLET(7.5 MG) BY MOUTH DAILY   metFORMIN (GLUCOPHAGE) 1000 MG tablet TAKE 1 TABLET BY MOUTH  TWICE DAILY   metoprolol succinate (TOPROL-XL) 25 MG 24 hr tablet Take 25 mg by mouth daily.    montelukast (SINGULAIR) 10 MG tablet TAKE 1 TABLET(10 MG) BY MOUTH DAILY (Patient taking differently: Take 10 mg by mouth at bedtime.)   NEEDLE, DISP, 18 G 18G X 1" MISC Use as directed   pantoprazole (PROTONIX) 40 MG tablet Take 1 tablet (40 mg total) by mouth 2 (two) times daily before a meal.   pregabalin (LYRICA) 150 MG capsule Take 1 capsule (150 mg total) by mouth 2 (two) times daily.   primidone (MYSOLINE) 50 MG tablet TAKE 2 TABLETS(100 MG) BY MOUTH DAILY   rosuvastatin (CRESTOR) 20 MG tablet Take 1 tablet (20 mg total) by mouth daily.   Semaglutide, 1 MG/DOSE, 4 MG/3ML SOPN Inject 1 mg into the skin once a week.   SYRINGE-NEEDLE, DISP, 3 ML (LUER LOCK SAFETY SYRINGES) 21G X 1-1/2" 3 ML MISC Use as directed for testosterone administration   testosterone cypionate (DEPOTESTOSTERONE CYPIONATE) 200 MG/ML injection INJECT 1 ML IN THE MUSCLE EVERY 14 DAYS   tiZANidine (ZANAFLEX) 4 MG tablet Take 1 tablet (4 mg total) by mouth every 8 (eight) hours as needed for muscle spasms.   triamcinolone (KENALOG) 0.025 % ointment Apply 1 application topically 2 (two) times daily.   WIXELA INHUB 250-50 MCG/DOSE AEPB Inhale 1 puff into the lungs 2 (two) times daily.   No facility-administered encounter medications on file as of 07/21/2020.    Patient Active Problem List   Diagnosis Date Noted   Sinus congestion 03/04/2020   Chronic bilateral low back pain with bilateral sciatica 12/06/2018   Lumbar degenerative disc disease 12/06/2018   Lumbar facet arthropathy 12/06/2018   Sacroiliac joint pain 12/06/2018   Chronic pain syndrome 12/06/2018   Bilateral primary osteoarthritis of knee 12/06/2018   GERD (gastroesophageal reflux disease) 11/12/2018   Coronary artery disease of native artery of native heart with stable angina pectoris ([place]) 08/16/2018   Diabetic peripheral neuropathy associated with type 2 diabetes mellitus ([place]) 04/30/2018   Former smoker 04/30/2018   Obesity (BMI 30-39.9) 04/30/2018    Type 2 diabetes mellitus with hyperglycemia, with long-term current use of insulin ([place]) 04/30/2018   Hyperlipidemia associated with type 2 diabetes mellitus ([place]) 04/21/2017   Asthma 04/21/2017   RLS (restless legs syndrome) 04/21/2017   Allergic rhinitis 04/21/2017   Hypertension associated with type 2 diabetes mellitus ([place]) 04/21/2017   Major depression 04/21/2017   Tremor 04/21/2017   Hypogonadism male 04/21/2017    Conditions to be addressed/monitored:CAD, HTN, HLD, DMII, Depression, and grief process  Care Plan : RNCM; Coronary Artery Disease (Adult) and HLD  Updates made by Vanita Ingles since 07/21/2020 12:00 AM     Problem: RNCM: Disease Progression (Coronary Artery Disease) and HLD   Priority: High     Long-Range Goal: RNCM: HLD and CAD   Priority: High  Note:   Current Barriers:  Poorly controlled hyperlipidemia, complicated by stress, HTN, DM uncontrolled, COPD Current antihyperlipidemic regimen: Tricor  145 mg QD and Crestor 20 mg QD Most recent lipid panel:     Component Value Date/Time   CHOL 146 04/28/2020 1016   CHOL 157 01/29/2018 1038   TRIG 405 (H) 04/28/2020 1016   TRIG 292 (H) 01/29/2018 1038   HDL 31 (L) 04/28/2020 1016   CHOLHDL 4.7 04/28/2020 1016   VLDL 58 (H) 01/29/2018 1038   LDLCALC 54 04/28/2020 1016   ASCVD risk enhancing conditions: age 28, DM, HTN, former smoker Unable to independently manage CAD and HLD Lacks social connections Does not contact provider office for questions/concerns RN Care Manager Clinical Goal(s):  patient will work with Consulting civil engineer, providers, and care team towards execution of optimized self-health management plan patient will verbalize understanding of plan for effective management of CAD and HLD patient will work with [facility] and pcp  to address needs related to CAD and HLD patient will take all medications exactly as prescribed and will call provider for medication related questions patient will attend all  scheduled medical appointments: next appointment with pcp on 07-29-2020 at 0920 am- reminded the patient of appointment today.  patient will demonstrate improved adherence to prescribed treatment plan for CAD and HLD the patient will demonstrate ongoing self health care management ability Interventions: Collaboration with Jon Billings, NP regarding development and update of comprehensive plan of care as evidenced by provider attestation and co-signature Inter-disciplinary care team collaboration (see longitudinal plan of care) Medication review performed; medication list updated in electronic medical record.  Inter-disciplinary care team collaboration (see longitudinal plan of care) Referred to pharmacy team for assistance with HLD medication management Evaluation of current treatment plan related to CAD and HLD and patient's adherence to plan as established by provider. Advised patient to follow a heart healthy/ADA diet, be mindful of stress levels and other factors that impact health and well being, medication compliance and calling the office for changes in condition, or questions.  Provided education to patient re: heart healthy/ADA diet and monitoring fats in diet. The patient likes fresh fruits and vegetables and states he is trying to watch what he eats better. Also education on exercise and keeping stress levels down.  Reviewed medications with patient and discussed compliance. The patient endorses compliance with medication regimen  Reviewed scheduled/upcoming provider appointments including: 07-29-2020 with pcp Discussed plans with patient for ongoing care management follow up and provided patient with direct contact information for care management team Patient Goals/Self-Care Activities: - call for medicine refill 2 or 3 days before it runs out - call if I am sick and can't take my medicine - keep a list of all the medicines I take; vitamins and herbals too - learn to read medicine  labels - use a pillbox to sort medicine - change to whole grain breads, cereal, pasta - drink 6 to 8 glasses of water each day - eat 3 to 5 servings of fruits and vegetables each day - eat 5 or 6 small meals each day - eat fish at least once per week - fill half the plate with nonstarchy vegetables - limit fast food meals to no more than 1 per week - manage portion size - prepare main meal at home 3 to 5 days each week - read food labels for fat, fiber, carbohydrates and portion size - be open to making changes - I can manage, know and watch for signs of a heart attack - if I have chest pain, call for help - learn about small changes that will  make a big difference - learn my personal risk factors - barriers to treatment adherence reviewed and addressed - difficulty of making life-long changes acknowledged - functional limitation screening reviewed - healthy lifestyle promoted - individualized medical nutrition therapy provided - medication-adherence assessment completed - medication side effects managed - rescue (action) plan developed - self-awareness of signs/symptoms of worsening disease encouraged Follow Up Plan: Telephone follow up appointment with care management team member scheduled for: 07-29-2020 at 09:20 am      Task: RNCM: Alleviate Barriers to Coronary Artery Disease Therapy and HLD   Note:   Care Management Activities:    - barriers to treatment adherence reviewed and addressed - difficulty of making life-long changes acknowledged - functional limitation screening reviewed - healthy lifestyle promoted - individualized medical nutrition therapy provided - medication-adherence assessment completed - medication side effects managed - rescue (action) plan developed - self-awareness of signs/symptoms of worsening disease encouraged        Care Plan : RNCM: Hypertension (Adult)  Updates made by Vanita Ingles since 07/21/2020 12:00 AM     Problem: RNCM:  Hypertension (Hypertension)   Priority: Medium     Long-Range Goal: RNCM: Hypertension Monitored   Priority: Medium  Note:   Objective:  Last practice recorded BP readings:  BP Readings from Last 3 Encounters:  04/28/20 131/72  03/12/20 125/65  03/04/20 119/72   Most recent eGFR/CrCl:  Lab Results  Component Value Date   EGFR 77 04/28/2020    No components found for: CRCL Current Barriers:  Knowledge Deficits related to basic understanding of hypertension pathophysiology and self care management Knowledge Deficits related to understanding of medications prescribed for management of hypertension Limited Social Support Unable to independently manage HTN  Lacks social connections Does not contact provider office for questions/concerns Case Manager Clinical Goal(s):  patient will verbalize understanding of plan for hypertension management patient will attend all scheduled medical appointments: 07-29-2020 at 0920 am patient will demonstrate improved adherence to prescribed treatment plan for hypertension as evidenced by taking all medications as prescribed, monitoring and recording blood pressure as directed, adhering to low sodium/DASH diet patient will demonstrate improved health management independence as evidenced by checking blood pressure as directed and notifying PCP if SBP>150 or DBP > 90, taking all medications as prescribe, and adhering to a low sodium diet as discussed. patient will verbalize basic understanding of hypertension disease process and self health management plan as evidenced by compliance with heart healthy/ADA diet, compliance with medications and working with the CCM team to manage health and well being.  Interventions:  Collaboration with Jon Billings, NP regarding development and update of comprehensive plan of care as evidenced by provider attestation and co-signature Inter-disciplinary care team collaboration (see longitudinal plan of care) Evaluation  of current treatment plan related to hypertension self management and patient's adherence to plan as established by provider. Provided education to patient re: stroke prevention, s/s of heart attack and stroke, DASH diet, complications of uncontrolled blood pressure Reviewed medications with patient and discussed importance of compliance Discussed plans with patient for ongoing care management follow up and provided patient with direct contact information for care management team Advised patient, providing education and rationale, to monitor blood pressure daily and record, calling PCP for findings outside established parameters.  Reviewed scheduled/upcoming provider appointments including: 07-29-2020 at 0920 am Self-Care Activities: - Self administers medications as prescribed Attends all scheduled provider appointments Calls provider office for new concerns, questions, or BP outside discussed parameters Checks BP and  records as discussed Follows a low sodium diet/DASH diet Patient Goals: - check blood pressure weekly - choose a place to take my blood pressure (home, clinic or office, retail store) - write blood pressure results in a log or diary - agree on reward when goals are met - agree to work together to make changes - ask questions to understand - have a family meeting to talk about healthy habits - learn about high blood pressure - blood pressure trends reviewed - depression screen reviewed - home or ambulatory blood pressure monitoring encouraged Follow Up Plan: Telephone follow up appointment with care management team member scheduled for: 09-15-2020 at 1030 am    Task: RNCM; Identify and Monitor Blood Pressure Elevation   Note:   Care Management Activities:    - blood pressure trends reviewed - depression screen reviewed - home or ambulatory blood pressure monitoring encouraged        Care Plan : RNCM: Diabetes Type 2 (Adult)  Updates made by Vanita Ingles since  07/21/2020 12:00 AM     Problem: RNCM: Glycemic Management (Diabetes, Type 2)   Priority: High     Long-Range Goal: RNCM: Glycemic Management Optimized   Priority: High  Note:   Objective:  Lab Results  Component Value Date   HGBA1C 10.0 (H) 04/28/2020   Lab Results  Component Value Date   CREATININE 1.09 04/28/2020   CREATININE 1.37 (H) 02/24/2020   CREATININE 1.07 01/14/2020   Lab Results  Component Value Date   EGFR 77 04/28/2020   Current Barriers:  Knowledge Deficits related to basic Diabetes pathophysiology and self care/management Knowledge Deficits related to medications used for management of diabetes Limited Social Support Unable to independently manage DM as evidence of hemoglobin A1C of 10.0 Does not adhere to provider recommendations re: following a heart healthy/ADA diet Lacks social connections Does not contact provider office for questions/concerns Case Manager Clinical Goal(s):  patient will demonstrate improved adherence to prescribed treatment plan for diabetes self care/management as evidenced by: daily monitoring and recording of CBG  adherence to ADA/ carb modified diet exercise 4/5 days/week adherence to prescribed medication regimen contacting provider for new or worsened symptoms or questions Interventions:  Collaboration with Jon Billings, NP regarding development and update of comprehensive plan of care as evidenced by provider attestation and co-signature Inter-disciplinary care team collaboration (see longitudinal plan of care) Provided education to patient about basic DM disease process Reviewed medications with patient and discussed importance of medication adherence Discussed plans with patient for ongoing care management follow up and provided patient with direct contact information for care management team Provided patient with written educational materials related to hypo and hyperglycemia and importance of correct treatment Reviewed  scheduled/upcoming provider appointments including: 07-29-2020 at 0920 am.  The patient states he has an appointment with Endocrinology in July 2022 Advised patient, providing education and rationale, to check cbg BID and record, calling pcp for findings outside established parameters.  The patient states his fasting blood sugars have been around 126 but last night it was >200 because he ate "water melon".  Education on fasting blood sugar <130 and post prandial of <180.  The patient is checking Bid and recording. Encouraged heart healthy/ADA diet. Will continue to monitor and educate accordingly.  Review of patient status, including review of consultants reports, relevant laboratory and other test results, and medications completed. Self-Care Activities - UNABLE to independently manage DM Attends all scheduled provider appointments Checks blood sugars as prescribed and utilize hyper  and hypoglycemia protocol as needed Adheres to prescribed ADA/carb modified Patient Goals: - check blood sugar at prescribed times - check blood sugar before and after exercise - check blood sugar if I feel it is too high or too low - enter blood sugar readings and medication or insulin into daily log - take the blood sugar log to all doctor visits - change to whole grain breads, cereal, pasta - set goal weight - drink 6 to 8 glasses of water each day - eat fish at least once per week - fill half of plate with vegetables - limit fast food meals to no more than 1 per week - manage portion size - prepare main meal at home 3 to 5 days each week - read food labels for fat, fiber, carbohydrates and portion size - schedule appointment with eye doctor - check feet daily for cuts, sores or redness - keep feet up while sitting - trim toenails straight across - wash and dry feet carefully every day - wear comfortable, cotton socks - wear comfortable, well-fitting shoes Last foot exam on 06-09-2020 with trimming of  nails - barriers to adherence to treatment plan identified - blood glucose monitoring encouraged - blood glucose readings reviewed - individualized medical nutrition therapy provided - mutual A1C goal set or reviewed - resources required to improve adherence to care identified - self-awareness of signs/symptoms of hypo or hyperglycemia encouraged - use of blood glucose monitoring log promoted Follow Up Plan: Telephone follow up appointment with care management team member scheduled for: 09-15-2020 at 1030 am  Timeframe:  Long-Range Goal Priority:  High Start Date:                             Expected End Date:        07-31-2021               Follow Up Date 09/15/2020    - check blood sugar at prescribed times - check blood sugar before and after exercise - check blood sugar if I feel it is too high or too low - enter blood sugar readings and medication or insulin into daily log - take the blood sugar log to all doctor visits    Why is this important?   Checking your blood sugar at home helps to keep it from getting very high or very low.  Writing the results in a diary or log helps the doctor know how to care for you.  Your blood sugar log should have the time, date and the results.  Also, write down the amount of insulin or other medicine that you take.  Other information, like what you ate, exercise done and how you were feeling, will also be helpful.     Notes: The patient is checking blood sugars BID- working on goals to get fasting <130 and post prandial of <180 Timeframe:  Short-Term Goal Priority:  Medium Start Date:                             Expected End Date:  09-29-2020                     Follow Up Date 09/15/2020    - check feet daily for cuts, sores or redness - keep feet up while sitting - trim toenails straight across - wash and dry feet carefully every day -  wear comfortable, cotton socks - wear comfortable, well-fitting shoes    Why is this important?    Good foot care is very important when you have diabetes.  There are many things you can do to keep your feet healthy and catch a problem early.    Notes: Saw podiatrist on 06-09-2020 for trimming of nails and foot evaluation. Will continue to monitor.      Task: RNCM: Alleviate Barriers to Glycemic Management   Note:   Care Management Activities:    - barriers to adherence to treatment plan identified - blood glucose monitoring encouraged - blood glucose readings reviewed - individualized medical nutrition therapy provided - mutual A1C goal set or reviewed - resources required to improve adherence to care identified - self-awareness of signs/symptoms of hypo or hyperglycemia encouraged - use of blood glucose monitoring log promoted        Care Plan : RNCM: Depression (Adult) and grief  Updates made by Vanita Ingles since 07/21/2020 12:00 AM     Problem: RNCM: Depression Identification (Depression)   Priority: High     Long-Range Goal: RNCM: Depressive Symptoms Identified and Grief   Priority: High  Note:   Current Barriers:  Ineffective Self Health Maintenance  Unable to independently manage depression and grief  Lacks social connections Does not contact provider office for questions/concerns Recent death of his mother on 08/03/2020 and his wife with lung cancer actively taking chemotherapy- treatments x 2 months now.  Clinical Goal(s):  Collaboration with Jon Billings, NP regarding development and update of comprehensive plan of care as evidenced by provider attestation and co-signature Inter-disciplinary care team collaboration (see longitudinal plan of care) patient will work with care management team to address care coordination and chronic disease management needs related to Disease Management Educational Needs Care Coordination Mental Health Counseling Caregiver Stress support Other grief due to the recent loss of his mother on 08/03/2020    Interventions:   Evaluation of current treatment plan related to Depression and grief  , Limited social support, Mental Health Concerns , and recent death of his mother on 2020/08/03 and his wife actively taking chemotherapy for lung cancer self-management and patient's adherence to plan as established by provider. The patient has been under a lot of stress recently. His mother passed away last week (Aug 03, 2020) and also his wife is actively taking chemo for treatment of lung cancer. The patient states that he is managing okay. He denies any acute distress but knows this is impacting his overall health and well being. Empathetic listening and support given. Will touch base with the LCSW for assistance with depression and grief process for added support and recommendations. Will continue to monitor.  Collaboration with Jon Billings, NP regarding development and update of comprehensive plan of care as evidenced by provider attestation       and co-signature Inter-disciplinary care team collaboration (see longitudinal plan of care) Discussed plans with patient for ongoing care management follow up and provided patient with direct contact information for care management team Self Care Activities:  Patient verbalizes understanding of plan to manage depression and grief process  Self administers medications as prescribed Attends all scheduled provider appointments Calls pharmacy for medication refills Performs ADL's independently Performs IADL's independently Calls provider office for new concerns or questions Patient Goals: - anxiety screen reviewed - depression screen reviewed - medication list reviewed Follow Up Plan: Telephone follow up appointment with care management team member scheduled for: 09-15-2020 at 1030 am Timeframe:  Short-Term Goal  Priority:  High Start Date:                             Expected End Date:      11-29-2020                 Follow Up Date 09/15/2020    - avoid negative self-talk -  develop a personal safety plan - develop a plan to deal with triggers like holidays, anniversaries - exercise at least 2 to 3 times per week - have a plan for how to handle bad days - spend time or talk with others at least 2 to 3 times per week - spend time or talk with others every day    Why is this important?   Keeping track of your progress will help your treatment team find the right mix of medicine and therapy for you.  Write in your journal every day.  Day-to-day changes in depression symptoms are normal. It may be more helpful to check your progress at the end of each week instead of every day.     Notes: recent death of his mother on 08-11-2020 and his wife is actively going through chemo for lung cancer.      Task: RNCM: Identify Depressive Symptoms and Facilitate Treatment   Note:   Care Management Activities:    - anxiety screen reviewed - depression screen reviewed - medication list reviewed         Plan:Telephone follow up appointment with care management team member scheduled for:  09-15-2020 at 34 am   [place], MSN, [facility] Mobile: [phone]

## 2020-07-21 NOTE — Patient Instructions (Signed)
Visit Information  PATIENT GOALS:  Goals Addressed               This Visit's Progress     COMPLETED: DIET - INCREASE WATER INTAKE        Recommend drinking at least 6-8 glasses of water a day        COMPLETED: RNCM-Pt-"I am taking my blood pressure at home" (pt-stated)        CARE PLAN ENTRY (see longtitudinal plan of care for additional care plan information)  Current Barriers: Closing this goal and opening in new ELS Chronic Disease Management support, education, and care coordination needs related to HTN and HLD  Clinical Goal(s) related to HTN and HLD:  Over the next 120 days, patient will:  Work with the care management team to address educational, disease management, and care coordination needs  Begin or continue self health monitoring activities as directed today Measure and record blood pressure 3/4 times per week and adhere to a heart healthy/ADA diet Call provider office for new or worsened signs and symptoms Blood pressure findings outside established parameters, Oxygen saturation lower than established parameter, Chest pain, Shortness of breath, and New or worsened symptom related to HLD and chronic conditions Call care management team with questions or concerns Verbalize basic understanding of patient centered plan of care established today  Interventions related to HTN and HLD:  Evaluation of current treatment plans and patient's adherence to plan as established by provider.  States compliance with medications and plan of care. 01-01-2020: States his blood pressure is good but he could not give accurate numbers. Denies issues with compliance.  Assessed patient understanding of disease states.  Review of blood pressure readings. The patient did not have any readings for the RNCM but states it is "good". Education on taking blood pressures regularly and recording, having list available at outreaches. 01-01-2020: States he is taking his blood pressures some. The patient  denies any issues with blood pressures.  Assessed patient's education and care coordination needs.  The patient needs consistent reminders to follow plan of care for health and wellness. 01-01-2020: The patient states on outreaches he wants to be healthy and maintain his health and well being. The patient has to have frequent reinforcement to stay on track with lifestyle changes to help control his chronic conditions. Will continue to work with the patient to help the patient reach his health and wellness goals. Reminded the patient of importance of following recommendations of the provider.  Provided disease specific education to patient.  Education and support on Heart heatlhy/ADA diet, taking bp and glucose readings consistently and writing them down. The patient denies any issues at this time. 11-08-2019: Will send new educational material by my chart and EMMI system for the patient on management of chronic conditions. Also will send information by mail to the patient on healthy eating. 01-01-2020: The patient reminded of being mindful of food choices, especially with the holiday season and the different variety of foods available. Will continue to work with the patient for education and making healthy choices.  Collaborated with appropriate clinical care team members regarding patient needs.  Ongoing support from CCM team pharmacist and LCSW.   Patient Self Care Activities related to HTN and HLD:  Patient is unable to independently self-manage chronic health conditions  Please see past updates related to this goal by clicking on the "Past Updates" button in the selected goal         RNCM: Monitor and Manage   My Blood Sugar-Diabetes Type 2        Timeframe:  Long-Range Goal Priority:  High Start Date:                             Expected End Date:        07-31-2021               Follow Up Date 09/15/2020    - check blood sugar at prescribed times - check blood sugar before and after exercise -  check blood sugar if I feel it is too high or too low - enter blood sugar readings and medication or insulin into daily log - take the blood sugar log to all doctor visits    Why is this important?   Checking your blood sugar at home helps to keep it from getting very high or very low.  Writing the results in a diary or log helps the doctor know how to care for you.  Your blood sugar log should have the time, date and the results.  Also, write down the amount of insulin or other medicine that you take.  Other information, like what you ate, exercise done and how you were feeling, will also be helpful.     Notes: The patient is checking blood sugars BID- working on goals to get fasting <130 and post prandial of <180       RNCM: Perform Foot Care-Diabetes Type 2        Timeframe:  Short-Term Goal Priority:  Medium Start Date:                             Expected End Date:  09-29-2020                     Follow Up Date 09/15/2020    - check feet daily for cuts, sores or redness - keep feet up while sitting - trim toenails straight across - wash and dry feet carefully every day - wear comfortable, cotton socks - wear comfortable, well-fitting shoes    Why is this important?   Good foot care is very important when you have diabetes.  There are many things you can do to keep your feet healthy and catch a problem early.    Notes: Saw podiatrist on 06-09-2020 for trimming of nails and foot evaluation. Will continue to monitor.        RNCM: Track and Manage My Symptoms-Depression        Timeframe:  Short-Term Goal Priority:  High Start Date:                             Expected End Date:      11-29-2020                 Follow Up Date 09/15/2020    - avoid negative self-talk - develop a personal safety plan - develop a plan to deal with triggers like holidays, anniversaries - exercise at least 2 to 3 times per week - have a plan for how to handle bad days - spend time or talk with  others at least 2 to 3 times per week - spend time or talk with others every day    Why is this important?   Keeping track of your progress will help your  treatment team find the right mix of medicine and therapy for you.  Write in your journal every day.  Day-to-day changes in depression symptoms are normal. It may be more helpful to check your progress at the end of each week instead of every day.     Notes: recent death of his mother on 07-16-2020 and his wife is actively going through chemo for lung cancer.        COMPLETED: RNCM:"My sugar was up this am because I ate grapes" (pt-stated)        Current Barriers: Closing this goal and opening in new ELS Knowledge Deficits related to negative impact on health and well being in a patient with chronic disease processes and not following a prescribed Heart healthy/ADA diet Financial Constraints.  Transportation barriers  Nurse Case Manager Clinical Goal(s):  Over the next 120 days, patient will verbalize understanding of plan for following a Heart healthy/ADA diet Over the next 120 days, patient will work with RNCM, pcp and CCM team to address needs related to dietary restrictions and following a heart healthy/ADA diet Over the next 120 days, patient will demonstrate a decrease in hyperglycemic exacerbations as evidenced by decreased blood sugar readings and decrease hemoglobin A1C levels Over the next 120 days, patient will demonstrate improved health management independence as evidenced bymaking healthy food choice, watching hidden salts and sugars, continuing to monitor blood sugars Over the next 120 days, patient will work with CM team pharmacist to monitor medications. The patient continues to work with the pharmacist to meet needs Over the next 90 days, patient will work with CM clinical social worker to assist with resources as needed- ongoing support and assistance from the social worker Over the next 90 days, patient will work with  care guides to get adequate transportation- completed   Interventions:  Evaluation of current treatment plan related to DM and HTN dietary restrictions and patient's adherence to plan as established by provider. 11-08-2019: The patient saw endocrinologist on 09-16-2019 and his hemoglobin is increasing.  It went from 9.3% in February to 10.2% on E-16-2021.  The patient states he has been eating a lot of bread. Ask the patient for blood sugar readings and he is having numbers in 300's.  The patient is not consistently following recommendations by the provider.  01-01-2020: The patient states that his blood sugars are better now. He states he is checking BID and this am was 180.  The patient has a follow up appointment with endocrinology on 01-16-2020. Advised patient to monitor his dietary intake.  The patient says his blood sugars vary depending on what he eats. He ate BBQ sandwiches last night, his blood sugar this am was 200.  Education and reiterated the importance of watching salts and sugars in meal planning. 11-08-2019: the patient states that his blood sugars have been "high". When evaluation of high the patient states "300's".  Endorses taking blood sugars daily but did not have list available.  Encouraged the patient to write down readings to have for CCM calls and MD visits. The patient states his weakness is bread. The patient realizes his diabetes is not in good control. Discussed at length dietary restrictions and the need to follow recommendations by provider. The patient had been drinking a lot of drinks but is drinking diet drinks now. 01-01-2020: The patient states that his blood sugar this am was 180.  He states that sometimes it is 200.  Review of practices and the patient states he is not   eating a lot of fruit like he was. Discussed the importance of good blood sugar control.  Provided education to patient re: to following a Heart Healthy/ADA diet.  The patient is aware of blood sugars being  elevated and blood pressure elevation with use of high sodium foods. 01-01-2020: Review of heart healthy/ADA diet. The patient verbalized his blood sugars change dependent on what he eats. Th patient is still making poor dietary choices. Education on breads, pastas, fruits, and corn being full of carbohydrates. The patient states he knows he has to do better. Education on monitoring dietary intake better and making positive changes to help with better control of diabetes. Education on how at least 15 minutes of moderate exercise can help lower blood sugars. The patient does like to walk. Encouraged walking daily.  Education on the effects uncontrolled DM of body systems, eyes, kidneys, etc.  The patient verbalized understanding. Will send information by mail on healthy food choices and getting blood sugars to goal. Education provided that fasting blood sugar should be <130 and post prandial of <180.  The patient verbalized understanding. Praised for cutting out sugary beverages.  Collaborated with pcp, CCM team regarding management of ADA.  Ongoing support and education needed.  Discussed plans with patient for ongoing care management follow up and provided patient with direct contact information for care management team Advised patient, providing education and rationale, to monitor blood pressure daily and record, calling pcp for findings outside established parameters.  Advised patient, providing education and rationale, to check cbg bid and record, calling pcp for findings outside established parameters.   Review of upcoming appointments. The patient states he will see the endocrinologist again next month. Has an appointment on 02-03-2020 at 1:20 pm to see Dr. Wynetta Emery. The patient states he has an appointment to see the specialist on 01-16-2020 and will have a new hemoglobin A1C.   Patient Self Care Activities:  Patient verbalizes understanding of plan to monitor health and  well being by following  prescribed diet for chronic health conditions Attends all scheduled provider appointments Calls provider office for new concerns or questions Unable to independently manage chronic medical conditions Does not adhere to provider recommendations re: heart healthy/ADA  Please see past updates related to this goal by clicking on the "Past Updates" button in the selected goal          Patient Care Plan: RNCM; Coronary Artery Disease (Adult) and HLD     Problem Identified: RNCM: Disease Progression (Coronary Artery Disease) and HLD   Priority: High     Long-Range Goal: RNCM: HLD and CAD   Priority: High  Note:   Current Barriers:  Poorly controlled hyperlipidemia, complicated by stress, HTN, DM uncontrolled, COPD Current antihyperlipidemic regimen: Tricor 145 mg QD and Crestor 20 mg QD Most recent lipid panel:     Component Value Date/Time   CHOL 146 04/28/2020 1016   CHOL 157 01/29/2018 1038   TRIG 405 (H) 04/28/2020 1016   TRIG 292 (H) 01/29/2018 1038   HDL 31 (L) 04/28/2020 1016   CHOLHDL 4.7 04/28/2020 1016   VLDL 58 (H) 01/29/2018 1038   [place] 54 04/28/2020 1016   ASCVD risk enhancing conditions: age 70, DM, HTN, former smoker Unable to independently manage CAD and HLD Lacks social connections Does not contact provider office for questions/concerns RN Care Manager Clinical Goal(s):  patient will work with Consulting civil engineer, providers, and care team towards execution of optimized self-health management plan patient will verbalize understanding of  plan for effective management of CAD and HLD patient will work with [facility] and pcp  to address needs related to CAD and HLD patient will take all medications exactly as prescribed and will call provider for medication related questions patient will attend all scheduled medical appointments: next appointment with pcp on 07-29-2020 at 0920 am- reminded the patient of appointment today.  patient will demonstrate improved adherence to  prescribed treatment plan for CAD and HLD the patient will demonstrate ongoing self health care management ability Interventions: Collaboration with Jon Billings, NP regarding development and update of comprehensive plan of care as evidenced by provider attestation and co-signature Inter-disciplinary care team collaboration (see longitudinal plan of care) Medication review performed; medication list updated in electronic medical record.  Inter-disciplinary care team collaboration (see longitudinal plan of care) Referred to pharmacy team for assistance with HLD medication management Evaluation of current treatment plan related to CAD and HLD and patient's adherence to plan as established by provider. Advised patient to follow a heart healthy/ADA diet, be mindful of stress levels and other factors that impact health and well being, medication compliance and calling the office for changes in condition, or questions.  Provided education to patient re: heart healthy/ADA diet and monitoring fats in diet. The patient likes fresh fruits and vegetables and states he is trying to watch what he eats better. Also education on exercise and keeping stress levels down.  Reviewed medications with patient and discussed compliance. The patient endorses compliance with medication regimen  Reviewed scheduled/upcoming provider appointments including: 07-29-2020 with pcp Discussed plans with patient for ongoing care management follow up and provided patient with direct contact information for care management team Patient Goals/Self-Care Activities: - call for medicine refill 2 or 3 days before it runs out - call if I am sick and can't take my medicine - keep a list of all the medicines I take; vitamins and herbals too - learn to read medicine labels - use a pillbox to sort medicine - change to whole grain breads, cereal, pasta - drink 6 to 8 glasses of water each day - eat 3 to 5 servings of fruits and  vegetables each day - eat 5 or 6 small meals each day - eat fish at least once per week - fill half the plate with nonstarchy vegetables - limit fast food meals to no more than 1 per week - manage portion size - prepare main meal at home 3 to 5 days each week - read food labels for fat, fiber, carbohydrates and portion size - be open to making changes - I can manage, know and watch for signs of a heart attack - if I have chest pain, call for help - learn about small changes that will make a big difference - learn my personal risk factors - barriers to treatment adherence reviewed and addressed - difficulty of making life-long changes acknowledged - functional limitation screening reviewed - healthy lifestyle promoted - individualized medical nutrition therapy provided - medication-adherence assessment completed - medication side effects managed - rescue (action) plan developed - self-awareness of signs/symptoms of worsening disease encouraged Follow Up Plan: Telephone follow up appointment with care management team member scheduled for: 07-29-2020 at 09:20 am      Task: RNCM: Alleviate Barriers to Coronary Artery Disease Therapy and HLD   Note:   Care Management Activities:    - barriers to treatment adherence reviewed and addressed - difficulty of making life-long changes acknowledged - functional limitation screening reviewed - healthy  lifestyle promoted - individualized medical nutrition therapy provided - medication-adherence assessment completed - medication side effects managed - rescue (action) plan developed - self-awareness of signs/symptoms of worsening disease encouraged        Patient Care Plan: RNCM: Hypertension (Adult)     Problem Identified: RNCM: Hypertension (Hypertension)   Priority: Medium     Long-Range Goal: RNCM: Hypertension Monitored   Priority: Medium  Note:   Objective:  Last practice recorded BP readings:  BP Readings from Last 3  Encounters:  04/28/20 131/72  03/12/20 125/65  03/04/20 119/72   Most recent eGFR/CrCl:  Lab Results  Component Value Date   EGFR 77 04/28/2020    No components found for: CRCL Current Barriers:  Knowledge Deficits related to basic understanding of hypertension pathophysiology and self care management Knowledge Deficits related to understanding of medications prescribed for management of hypertension Limited Social Support Unable to independently manage HTN  Lacks social connections Does not contact provider office for questions/concerns Case Manager Clinical Goal(s):  patient will verbalize understanding of plan for hypertension management patient will attend all scheduled medical appointments: 07-29-2020 at 0920 am patient will demonstrate improved adherence to prescribed treatment plan for hypertension as evidenced by taking all medications as prescribed, monitoring and recording blood pressure as directed, adhering to low sodium/DASH diet patient will demonstrate improved health management independence as evidenced by checking blood pressure as directed and notifying PCP if SBP>150 or DBP > 90, taking all medications as prescribe, and adhering to a low sodium diet as discussed. patient will verbalize basic understanding of hypertension disease process and self health management plan as evidenced by compliance with heart healthy/ADA diet, compliance with medications and working with the CCM team to manage health and well being.  Interventions:  Collaboration with Jon Billings, NP regarding development and update of comprehensive plan of care as evidenced by provider attestation and co-signature Inter-disciplinary care team collaboration (see longitudinal plan of care) Evaluation of current treatment plan related to hypertension self management and patient's adherence to plan as established by provider. Provided education to patient re: stroke prevention, s/s of heart attack and  stroke, DASH diet, complications of uncontrolled blood pressure Reviewed medications with patient and discussed importance of compliance Discussed plans with patient for ongoing care management follow up and provided patient with direct contact information for care management team Advised patient, providing education and rationale, to monitor blood pressure daily and record, calling PCP for findings outside established parameters.  Reviewed scheduled/upcoming provider appointments including: 07-29-2020 at 0920 am Self-Care Activities: - Self administers medications as prescribed Attends all scheduled provider appointments Calls provider office for new concerns, questions, or BP outside discussed parameters Checks BP and records as discussed Follows a low sodium diet/DASH diet Patient Goals: - check blood pressure weekly - choose a place to take my blood pressure (home, clinic or office, retail store) - write blood pressure results in a log or diary - agree on reward when goals are met - agree to work together to make changes - ask questions to understand - have a family meeting to talk about healthy habits - learn about high blood pressure - blood pressure trends reviewed - depression screen reviewed - home or ambulatory blood pressure monitoring encouraged Follow Up Plan: Telephone follow up appointment with care management team member scheduled for: 09-15-2020 at 1030 am    Task: RNCM; Identify and Monitor Blood Pressure Elevation   Note:   Care Management Activities:    - blood pressure trends  reviewed - depression screen reviewed - home or ambulatory blood pressure monitoring encouraged        Patient Care Plan: RNCM: Diabetes Type 2 (Adult)     Problem Identified: RNCM: Glycemic Management (Diabetes, Type 2)   Priority: High     Long-Range Goal: RNCM: Glycemic Management Optimized   Priority: High  Note:   Objective:  Lab Results  Component Value Date   HGBA1C  10.0 (H) 04/28/2020   Lab Results  Component Value Date   CREATININE 1.09 04/28/2020   CREATININE 1.37 (H) 02/24/2020   CREATININE 1.07 01/14/2020   Lab Results  Component Value Date   EGFR 77 04/28/2020   Current Barriers:  Knowledge Deficits related to basic Diabetes pathophysiology and self care/management Knowledge Deficits related to medications used for management of diabetes Limited Social Support Unable to independently manage DM as evidence of hemoglobin A1C of 10.0 Does not adhere to provider recommendations re: following a heart healthy/ADA diet Lacks social connections Does not contact provider office for questions/concerns Case Manager Clinical Goal(s):  patient will demonstrate improved adherence to prescribed treatment plan for diabetes self care/management as evidenced by: daily monitoring and recording of CBG  adherence to ADA/ carb modified diet exercise 4/5 days/week adherence to prescribed medication regimen contacting provider for new or worsened symptoms or questions Interventions:  Collaboration with Holdsworth, Karen, NP regarding development and update of comprehensive plan of care as evidenced by provider attestation and co-signature Inter-disciplinary care team collaboration (see longitudinal plan of care) Provided education to patient about basic DM disease process Reviewed medications with patient and discussed importance of medication adherence Discussed plans with patient for ongoing care management follow up and provided patient with direct contact information for care management team Provided patient with written educational materials related to hypo and hyperglycemia and importance of correct treatment Reviewed scheduled/upcoming provider appointments including: 07-29-2020 at 0920 am.  The patient states he has an appointment with Endocrinology in July 2022 Advised patient, providing education and rationale, to check cbg BID and record, calling pcp for  findings outside established parameters.  The patient states his fasting blood sugars have been around 126 but last night it was >200 because he ate "water melon".  Education on fasting blood sugar <130 and post prandial of <180.  The patient is checking Bid and recording. Encouraged heart healthy/ADA diet. Will continue to monitor and educate accordingly.  Review of patient status, including review of consultants reports, relevant laboratory and other test results, and medications completed. Self-Care Activities - UNABLE to independently manage DM Attends all scheduled provider appointments Checks blood sugars as prescribed and utilize hyper and hypoglycemia protocol as needed Adheres to prescribed ADA/carb modified Patient Goals: - check blood sugar at prescribed times - check blood sugar before and after exercise - check blood sugar if I feel it is too high or too low - enter blood sugar readings and medication or insulin into daily log - take the blood sugar log to all doctor visits - change to whole grain breads, cereal, pasta - set goal weight - drink 6 to 8 glasses of water each day - eat fish at least once per week - fill half of plate with vegetables - limit fast food meals to no more than 1 per week - manage portion size - prepare main meal at home 3 to 5 days each week - read food labels for fat, fiber, carbohydrates and portion size - schedule appointment with eye doctor - check feet daily   for cuts, sores or redness - keep feet up while sitting - trim toenails straight across - wash and dry feet carefully every day - wear comfortable, cotton socks - wear comfortable, well-fitting shoes Last foot exam on 06-09-2020 with trimming of nails - barriers to adherence to treatment plan identified - blood glucose monitoring encouraged - blood glucose readings reviewed - individualized medical nutrition therapy provided - mutual A1C goal set or reviewed - resources required to  improve adherence to care identified - self-awareness of signs/symptoms of hypo or hyperglycemia encouraged - use of blood glucose monitoring log promoted Follow Up Plan: Telephone follow up appointment with care management team member scheduled for: 09-15-2020 at 1030 am  Timeframe:  Long-Range Goal Priority:  High Start Date:                             Expected End Date:        07-31-2021               Follow Up Date 09/15/2020    - check blood sugar at prescribed times - check blood sugar before and after exercise - check blood sugar if I feel it is too high or too low - enter blood sugar readings and medication or insulin into daily log - take the blood sugar log to all doctor visits    Why is this important?   Checking your blood sugar at home helps to keep it from getting very high or very low.  Writing the results in a diary or log helps the doctor know how to care for you.  Your blood sugar log should have the time, date and the results.  Also, write down the amount of insulin or other medicine that you take.  Other information, like what you ate, exercise done and how you were feeling, will also be helpful.     Notes: The patient is checking blood sugars BID- working on goals to get fasting <130 and post prandial of <180 Timeframe:  Short-Term Goal Priority:  Medium Start Date:                             Expected End Date:  09-29-2020                     Follow Up Date 09/15/2020    - check feet daily for cuts, sores or redness - keep feet up while sitting - trim toenails straight across - wash and dry feet carefully every day - wear comfortable, cotton socks - wear comfortable, well-fitting shoes    Why is this important?   Good foot care is very important when you have diabetes.  There are many things you can do to keep your feet healthy and catch a problem early.    Notes: Saw podiatrist on 06-09-2020 for trimming of nails and foot evaluation. Will continue to  monitor.      Task: RNCM: Alleviate Barriers to Glycemic Management   Note:   Care Management Activities:    - barriers to adherence to treatment plan identified - blood glucose monitoring encouraged - blood glucose readings reviewed - individualized medical nutrition therapy provided - mutual A1C goal set or reviewed - resources required to improve adherence to care identified - self-awareness of signs/symptoms of hypo or hyperglycemia encouraged - use of blood glucose monitoring log promoted          Patient Care Plan: RNCM: Depression (Adult) and grief     Problem Identified: RNCM: Depression Identification (Depression)   Priority: High     Long-Range Goal: RNCM: Depressive Symptoms Identified and Grief   Priority: High  Note:   Current Barriers:  Ineffective Self Health Maintenance  Unable to independently manage depression and grief  Lacks social connections Does not contact provider office for questions/concerns Recent death of his mother on 08-03-2020 and his wife with lung cancer actively taking chemotherapy- treatments x 2 months now.  Clinical Goal(s):  Collaboration with Jon Billings, NP regarding development and update of comprehensive plan of care as evidenced by provider attestation and co-signature Inter-disciplinary care team collaboration (see longitudinal plan of care) patient will work with care management team to address care coordination and chronic disease management needs related to Disease Management Educational Needs Care Coordination Mental Health Counseling Caregiver Stress support Other grief due to the recent loss of his mother on 08-03-2020    Interventions:  Evaluation of current treatment plan related to Depression and grief  , Limited social support, Mental Health Concerns , and recent death of his mother on 2020/08/03 and his wife actively taking chemotherapy for lung cancer self-management and patient's adherence to plan as established  by provider. The patient has been under a lot of stress recently. His mother passed away last week (2020-08-03) and also his wife is actively taking chemo for treatment of lung cancer. The patient states that he is managing okay. He denies any acute distress but knows this is impacting his overall health and well being. Empathetic listening and support given. Will touch base with the LCSW for assistance with depression and grief process for added support and recommendations. Will continue to monitor.  Collaboration with Jon Billings, NP regarding development and update of comprehensive plan of care as evidenced by provider attestation       and co-signature Inter-disciplinary care team collaboration (see longitudinal plan of care) Discussed plans with patient for ongoing care management follow up and provided patient with direct contact information for care management team Self Care Activities:  Patient verbalizes understanding of plan to manage depression and grief process  Self administers medications as prescribed Attends all scheduled provider appointments Calls pharmacy for medication refills Performs ADL's independently Performs IADL's independently Calls provider office for new concerns or questions Patient Goals: - anxiety screen reviewed - depression screen reviewed - medication list reviewed Follow Up Plan: Telephone follow up appointment with care management team member scheduled for: 09-15-2020 at 1030 am Timeframe:  Short-Term Goal Priority:  High Start Date:                             Expected End Date:      11-29-2020                 Follow Up Date 09/15/2020    - avoid negative self-talk - develop a personal safety plan - develop a plan to deal with triggers like holidays, anniversaries - exercise at least 2 to 3 times per week - have a plan for how to handle bad days - spend time or talk with others at least 2 to 3 times per week - spend time or talk with others  every day    Why is this important?   Keeping track of your progress will help your treatment team find the right mix of medicine and therapy for you.  Write in  your journal every day.  Day-to-day changes in depression symptoms are normal. It may be more helpful to check your progress at the end of each week instead of every day.     Notes: recent death of his mother on 07-16-2020 and his wife is actively going through chemo for lung cancer.      Task: RNCM: Identify Depressive Symptoms and Facilitate Treatment   Note:   Care Management Activities:    - anxiety screen reviewed - depression screen reviewed - medication list reviewed         Patient verbalizes understanding of instructions provided today and agrees to view in MyChart.   Telephone follow up appointment with care management team member scheduled for: 09-15-2020 at 1030 am  Pam Tate RN, MSN, CCM Community Care Coordinator [place]  [facility] Mobile: [phone]   

## 2020-07-21 NOTE — Telephone Encounter (Signed)
filled electronically 07/19/20 #90 1 RF and was sent to [facility]

## 2020-07-21 NOTE — Telephone Encounter (Signed)
Medication Refill - Medication: citalopram (CELEXA) 10 MG tablet   Has the patient contacted their pharmacy? No.  Preferred Pharmacy (with phone number or street name): [facility]  [facility]) [place], [place] - [address] [place], [address]  [address] [place], [address], [place] [address]  Phone:  [phone]  Fax:  [phone]   Agent: Please be advised that RX refills may take up to 3 business days. We ask that you follow-up with your pharmacy.

## 2020-07-26 ENCOUNTER — Other Ambulatory Visit: Payer: Self-pay | Admitting: Nurse Practitioner

## 2020-07-26 NOTE — Telephone Encounter (Signed)
Requested medication (s) are due for refill today: yes  Requested medication (s) are on the active medication list: yes  Last refill:  06/27/20  Future visit scheduled: yes  Notes to clinic:  note attached to last RF "Must keep upcoming appt for further refill" Pt has 2 upcoming appts this week: 07/28/20 and 07/29/20   Requested Prescriptions  Pending Prescriptions Disp Refills   furosemide (LASIX) 20 MG tablet [Pharmacy Med Name: FUROSEMIDE 20MG  TABLETS] 60 tablet 0    Sig: TAKE 1 TABLET(20 MG) BY MOUTH TWICE DAILY      Cardiovascular:  Diuretics - Loop Passed - 07/26/2020  4:00 PM      Passed - K in normal range and within 360 days    Potassium  Date Value Ref Range Status  04/28/2020 3.7 3.5 - 5.2 mmol/L Final          Passed - Ca in normal range and within 360 days    Calcium  Date Value Ref Range Status  04/28/2020 9.2 8.6 - 10.2 mg/dL Final          Passed - Na in normal range and within 360 days    Sodium  Date Value Ref Range Status  04/28/2020 142 134 - 144 mmol/L Final          Passed - Cr in normal range and within 360 days    Creatinine, Ser  Date Value Ref Range Status  04/28/2020 1.09 0.76 - 1.27 mg/dL Final          Passed - Last BP in normal range    BP Readings from Last 1 Encounters:  04/28/20 131/72          Passed - Valid encounter within last 6 months    Recent Outpatient Visits           2 months ago Coronary artery disease of native artery of native heart with stable angina pectoris (HCC)   [facility], NP   4 months ago Type 2 diabetes mellitus with hyperglycemia, with long-term current use of insulin (HCC)   [facility] [place], [place] T, NP   5 months ago Routine general medical examination at a health care facility   [facility], NP   1 year ago Essential hypertension   [facility], Particia Nearing   1 year ago Chronic bilateral low  back pain with bilateral sciatica   [facility], Particia Nearing       Future Appointments             In 2 days [place], Laural Benes, DO [facility] Rud, [facility]   In 3 days [employer], NP [name], [facility]   In 2 weeks [employer], Lonna Cobb, MD [facility]   In 2 months [facility], MD [facility]   In 3 months  [facility], [facility]

## 2020-07-27 ENCOUNTER — Other Ambulatory Visit: Payer: Self-pay | Admitting: Nurse Practitioner

## 2020-07-27 NOTE — Telephone Encounter (Signed)
   Notes to clinic:  requesting 90 day supply   Requested Prescriptions  Pending Prescriptions Disp Refills   furosemide (LASIX) 20 MG tablet [Pharmacy Med Name: FUROSEMIDE 20MG  TABLETS] 180 tablet     Sig: TAKE 1 TABLET(20 MG) BY MOUTH TWICE DAILY      Cardiovascular:  Diuretics - Loop Passed - 07/27/2020  8:54 AM      Passed - K in normal range and within 360 days    Potassium  Date Value Ref Range Status  04/28/2020 3.7 3.5 - 5.2 mmol/L Final          Passed - Ca in normal range and within 360 days    Calcium  Date Value Ref Range Status  04/28/2020 9.2 8.6 - 10.2 mg/dL Final          Passed - Na in normal range and within 360 days    Sodium  Date Value Ref Range Status  04/28/2020 142 134 - 144 mmol/L Final          Passed - Cr in normal range and within 360 days    Creatinine, Ser  Date Value Ref Range Status  04/28/2020 1.09 0.76 - 1.27 mg/dL Final          Passed - Last BP in normal range    BP Readings from Last 1 Encounters:  04/28/20 131/72          Passed - Valid encounter within last 6 months    Recent Outpatient Visits           3 months ago Coronary artery disease of native artery of native heart with stable angina pectoris (HCC)   [facility], NP   4 months ago Type 2 diabetes mellitus with hyperglycemia, with long-term current use of insulin (HCC)   [facility] [place], [place] T, NP   5 months ago Routine general medical examination at a health care facility   [facility], NP   1 year ago Essential hypertension   [facility], Particia Nearing   1 year ago Chronic bilateral low back pain with bilateral sciatica   [facility] [place], [place], Salley Hews       Future Appointments             Tomorrow [place], DO [facility], [facility]   In 2 days Dorcas Carrow, NP [facility], [facility]   In 2 weeks  [facility], Lonna Cobb, MD [facility]   In 2 months [facility], MD [facility]   In 3 months  [facility], [facility]

## 2020-07-27 NOTE — Telephone Encounter (Signed)
Pt is scheduled 6/28

## 2020-07-27 NOTE — Telephone Encounter (Signed)
Pt has apt on 07/28/2020 and 07/29/2020

## 2020-07-28 ENCOUNTER — Other Ambulatory Visit: Payer: Self-pay

## 2020-07-28 ENCOUNTER — Encounter: Payer: Self-pay | Admitting: Family Medicine

## 2020-07-28 ENCOUNTER — Ambulatory Visit (INDEPENDENT_AMBULATORY_CARE_PROVIDER_SITE_OTHER): Payer: Medicare Other | Admitting: Family Medicine

## 2020-07-28 VITALS — BP 125/65 | HR 80 | Temp 98.1°F | Ht 67.05 in | Wt 217.2 lb

## 2020-07-28 DIAGNOSIS — Z794 Long term (current) use of insulin: Secondary | ICD-10-CM

## 2020-07-28 DIAGNOSIS — E1165 Type 2 diabetes mellitus with hyperglycemia: Secondary | ICD-10-CM | POA: Diagnosis not present

## 2020-07-28 NOTE — Progress Notes (Signed)
BP 125/65   Pulse 80   Temp 98.1 F (36.7 C) (Oral)   Ht 5' 7.05" (1.703 m)   Wt 217 lb 3.2 oz (98.5 kg)   SpO2 95%   BMI 33.97 kg/m    Subjective:    Patient ID: Corey [name], male    DOB: [birth year], 65 y.o.   MRN: [id number]  HPI: Corey [name] is a 65 y.o. male  Chief Complaint  Patient presents with   Therapeutic shoes   Diabetes   Primo is here today as he needs diabetic shoes. He follows with his PCP regularly for his diabetes. Due to see her again tomorrow. Sugars have not been under good control- but medications are being adjusted. He has a history of foot deformity putting him at increased risk for ulcers with his diabetes. He is otherwise feeling well. No other concerns.   Relevant past medical, surgical, family and social history reviewed and updated as indicated. Interim medical history since our last visit reviewed. Allergies and medications reviewed and updated.  Review of Systems  Constitutional: Negative.   Respiratory: Negative.    Cardiovascular: Negative.   Gastrointestinal: Negative.   Musculoskeletal: Negative.   Psychiatric/Behavioral: Negative.     Per HPI unless specifically indicated above     Objective:    BP 125/65   Pulse 80   Temp 98.1 F (36.7 C) (Oral)   Ht 5' 7.05" (1.703 m)   Wt 217 lb 3.2 oz (98.5 kg)   SpO2 95%   BMI 33.97 kg/m   Wt Readings from Last 3 Encounters:  07/28/20 217 lb 3.2 oz (98.5 kg)  04/28/20 215 lb 4 oz (97.6 kg)  03/12/20 214 lb (97.1 kg)    Physical Exam Vitals and nursing note reviewed.  Constitutional:      General: He is not in acute distress.    Appearance: Normal appearance. He is not ill-appearing, toxic-appearing or diaphoretic.  HENT:     Head: Normocephalic and atraumatic.     Right Ear: External ear normal.     Left Ear: External ear normal.     Nose: Nose normal.     Mouth/Throat:     Mouth: Mucous membranes are moist.     Pharynx: Oropharynx is clear.  Eyes:     General: No  scleral icterus.       Right eye: No discharge.        Left eye: No discharge.     Extraocular Movements: Extraocular movements intact.     Conjunctiva/sclera: Conjunctivae normal.     Pupils: Pupils are equal, round, and reactive to light.  Cardiovascular:     Rate and Rhythm: Normal rate and regular rhythm.     Pulses: Normal pulses.     Heart sounds: Normal heart sounds. No murmur heard.   No friction rub. No gallop.  Pulmonary:     Effort: Pulmonary effort is normal. No respiratory distress.     Breath sounds: Normal breath sounds. No stridor. No wheezing, rhonchi or rales.  Chest:     Chest wall: No tenderness.  Musculoskeletal:        General: Normal range of motion.     Cervical back: Normal range of motion and neck supple.  Skin:    General: Skin is warm and dry.     Capillary Refill: Capillary refill takes less than 2 seconds.     Coloration: Skin is not jaundiced or pale.     Findings: No bruising, erythema,  lesion or rash.  Neurological:     General: No focal deficit present.     Mental Status: He is alert and oriented to person, place, and time. Mental status is at baseline.  Psychiatric:        Mood and Affect: Mood normal.        Behavior: Behavior normal.        Thought Content: Thought content normal.        Judgment: Judgment normal.    Results for orders placed or performed in visit on 04/28/20  Bayer DCA Hb A1c Waived  Result Value Ref Range   HB A1C (BAYER DCA - WAIVED) 10.0 (H) <7.0 %  Lipid panel  Result Value Ref Range   Cholesterol, Total 146 100 - 199 mg/dL   Triglycerides 405 (H) 0 - 149 mg/dL   HDL 31 (L) >39 mg/dL   VLDL Cholesterol Cal 61 (H) 5 - 40 mg/dL   LDL Chol Calc (NIH) 54 0 - 99 mg/dL   Chol/HDL Ratio 4.7 0.0 - 5.0 ratio  Basic metabolic panel  Result Value Ref Range   Glucose 123 (H) 65 - 99 mg/dL   BUN 16 8 - 27 mg/dL   Creatinine, Ser 1.09 0.76 - 1.27 mg/dL   eGFR 77 >59 mL/min/1.73   BUN/Creatinine Ratio 15 10 - 24   Sodium  142 134 - 144 mmol/L   Potassium 3.7 3.5 - 5.2 mmol/L   Chloride 99 96 - 106 mmol/L   CO2 21 20 - 29 mmol/L   Calcium 9.2 8.6 - 10.2 mg/dL  Vitamin B12  Result Value Ref Range   Vitamin B-12 1,617 (H) 232 - 1,245 pg/mL  Urinalysis, Routine w reflex microscopic  Result Value Ref Range   Specific Gravity, UA 1.010 1.005 - 1.030   pH, UA 6.0 5.0 - 7.5   Color, UA Yellow Yellow   Appearance Ur Clear Clear   Leukocytes,UA Negative Negative   Protein,UA Negative Negative/Trace   Glucose, UA 3+ (A) Negative   Ketones, UA Negative Negative   RBC, UA Negative Negative   Bilirubin, UA Negative Negative   Urobilinogen, Ur 0.2 0.2 - 1.0 mg/dL   Nitrite, UA Negative Negative      Assessment & Plan:   Problem List Items Addressed This Visit       Endocrine   Type 2 diabetes mellitus with hyperglycemia, with long-term current use of insulin ([place]) - Primary    Continue to follow with PCP as scheduled. Needs diabetic shoes. Paperwork filled out for him today. Call with any concerns.          Follow up plan: Return As scheduled.

## 2020-07-28 NOTE — Assessment & Plan Note (Signed)
Continue to follow with PCP as scheduled. Needs diabetic shoes. Paperwork filled out for him today. Call with any concerns.

## 2020-07-29 ENCOUNTER — Ambulatory Visit (INDEPENDENT_AMBULATORY_CARE_PROVIDER_SITE_OTHER): Payer: Medicare Other | Admitting: Nurse Practitioner

## 2020-07-29 ENCOUNTER — Telehealth: Payer: Self-pay | Admitting: Nurse Practitioner

## 2020-07-29 ENCOUNTER — Encounter: Payer: Self-pay | Admitting: Nurse Practitioner

## 2020-07-29 VITALS — BP 102/49 | HR 80 | Temp 98.5°F | Ht 67.0 in | Wt 220.4 lb

## 2020-07-29 DIAGNOSIS — R600 Localized edema: Secondary | ICD-10-CM

## 2020-07-29 DIAGNOSIS — E1169 Type 2 diabetes mellitus with other specified complication: Secondary | ICD-10-CM

## 2020-07-29 DIAGNOSIS — E1165 Type 2 diabetes mellitus with hyperglycemia: Secondary | ICD-10-CM

## 2020-07-29 DIAGNOSIS — E1142 Type 2 diabetes mellitus with diabetic polyneuropathy: Secondary | ICD-10-CM

## 2020-07-29 DIAGNOSIS — E785 Hyperlipidemia, unspecified: Secondary | ICD-10-CM

## 2020-07-29 DIAGNOSIS — I152 Hypertension secondary to endocrine disorders: Secondary | ICD-10-CM

## 2020-07-29 DIAGNOSIS — E1159 Type 2 diabetes mellitus with other circulatory complications: Secondary | ICD-10-CM | POA: Diagnosis not present

## 2020-07-29 DIAGNOSIS — Z794 Long term (current) use of insulin: Secondary | ICD-10-CM

## 2020-07-29 MED ORDER — TRIAMCINOLONE ACETONIDE 0.025 % EX OINT: 1.0000 "application " | TOPICAL_OINTMENT | Freq: Two times a day (BID) | CUTANEOUS | 1 refills | Status: DC

## 2020-07-29 NOTE — Assessment & Plan Note (Signed)
Chronic.  Blood pressure well controlled.  Continue with current medication regimen.  Labs ordered today.  Continue to follow up with Cardiology. Recommend patient make an appointment with Cardiology to follow up on edema.

## 2020-07-29 NOTE — Telephone Encounter (Signed)
Called and LVM asking for patient to please return my call.  

## 2020-07-29 NOTE — Assessment & Plan Note (Signed)
Stable.  Continue to follow up with pain management. Patient recently evaluated for shoes to help with neuropathy.

## 2020-07-29 NOTE — Progress Notes (Signed)
BP (!) 102/49   Pulse 80   Temp 98.5 F (36.9 C) (Oral)   Ht 5' 7"  (1.702 m)   Wt 220 lb 6.4 oz (100 kg)   SpO2 95%   BMI 34.52 kg/m    Subjective:    Patient ID: Corey [name], male    DOB: Sep 01, 1955, 65 y.o.   MRN: [id number]  HPI: Corey [name] is a 65 y.o. male  Chief Complaint  Patient presents with   Diabetes   Hyperlipidemia   Hypertension   HYPERTENSION / HYPERLIPIDEMIA Satisfied with current treatment? yes Duration of hypertension: chronic BP monitoring frequency: not checking BP range:  BP medication side effects: no Past BP meds: Losartan 40m, Imdur 659mand Metoprolol 2564mDuration of hyperlipidemia: chronic Cholesterol medication side effects: yes Cholesterol supplements: None. Past cholesterol medications: Crestor 60m6medication compliance: excellent compliance Aspirin: yes Recent stressors: no Recurrent headaches: no Visual changes: no Palpitations: no Dyspnea: no Chest pain: no Lower extremity edema: yes- patient states it usually goes down but recently it has been persistent. Dizzy/lightheaded: no   DIABETES Hypoglycemic episodes:no Polydipsia/polyuria: no Visual disturbance: no Chest pain: no Paresthesias: no Glucose Monitoring: yes             Accucheck frequency: Daily             Fasting glucose: 119s             Post prandial: 188s Taking Insulin?: yes             Long acting insulin: Lantus 85 U daily Blood Pressure Monitoring: not checking Retinal Examination: Not up to Date Foot Exam: Up to Date      Relevant past medical, surgical, family and social history reviewed and updated as indicated. Interim medical history since our last visit reviewed. Allergies and medications reviewed and updated.  Review of Systems  Eyes:  Negative for visual disturbance.  Respiratory:  Negative for chest tightness and shortness of breath.   Cardiovascular:  Positive for leg swelling. Negative for chest pain and palpitations.   Endocrine: Negative for polydipsia and polyuria.  Neurological:  Negative for dizziness, light-headedness, numbness and headaches.   Per HPI unless specifically indicated above     Objective:    BP (!) 102/49   Pulse 80   Temp 98.5 F (36.9 C) (Oral)   Ht 5' 7"  (1.702 m)   Wt 220 lb 6.4 oz (100 kg)   SpO2 95%   BMI 34.52 kg/m   Wt Readings from Last 3 Encounters:  07/29/20 220 lb 6.4 oz (100 kg)  07/28/20 217 lb 3.2 oz (98.5 kg)  04/28/20 215 lb 4 oz (97.6 kg)    Physical Exam Vitals and nursing note reviewed.  Constitutional:      General: He is not in acute distress.    Appearance: Normal appearance. He is not ill-appearing, toxic-appearing or diaphoretic.  HENT:     Head: Normocephalic.     Right Ear: External ear normal.     Left Ear: External ear normal.     Nose: Nose normal. No congestion or rhinorrhea.     Mouth/Throat:     Mouth: Mucous membranes are moist.  Eyes:     General:        Right eye: No discharge.        Left eye: No discharge.     Extraocular Movements: Extraocular movements intact.     Conjunctiva/sclera: Conjunctivae normal.     Pupils: Pupils are  equal, round, and reactive to light.  Cardiovascular:     Rate and Rhythm: Normal rate and regular rhythm.     Heart sounds: No murmur heard. Pulmonary:     Effort: Pulmonary effort is normal. No respiratory distress.     Breath sounds: Normal breath sounds. No wheezing, rhonchi or rales.  Abdominal:     General: Abdomen is flat. Bowel sounds are normal.  Musculoskeletal:     Cervical back: Normal range of motion and neck supple.     Right lower leg: 1+ Pitting Edema present.     Left lower leg: 1+ Pitting Edema present.  Skin:    General: Skin is warm and dry.     Capillary Refill: Capillary refill takes less than 2 seconds.     Findings: Rash: red, dry, flaky macular rash on bilateral cheeks..  Neurological:     General: No focal deficit present.     Mental Status: He is alert and  oriented to person, place, and time.  Psychiatric:        Mood and Affect: Mood normal.        Behavior: Behavior normal.        Thought Content: Thought content normal.        Judgment: Judgment normal.    Results for orders placed or performed in visit on 04/28/20  Bayer DCA Hb A1c Waived  Result Value Ref Range   HB A1C (BAYER DCA - WAIVED) 10.0 (H) <7.0 %  Lipid panel  Result Value Ref Range   Cholesterol, Total 146 100 - 199 mg/dL   Triglycerides 405 (H) 0 - 149 mg/dL   HDL 31 (L) >39 mg/dL   VLDL Cholesterol Cal 61 (H) 5 - 40 mg/dL   LDL Chol Calc (NIH) 54 0 - 99 mg/dL   Chol/HDL Ratio 4.7 0.0 - 5.0 ratio  Basic metabolic panel  Result Value Ref Range   Glucose 123 (H) 65 - 99 mg/dL   BUN 16 8 - 27 mg/dL   Creatinine, Ser 1.09 0.76 - 1.27 mg/dL   eGFR 77 >59 mL/min/1.73   BUN/Creatinine Ratio 15 10 - 24   Sodium 142 134 - 144 mmol/L   Potassium 3.7 3.5 - 5.2 mmol/L   Chloride 99 96 - 106 mmol/L   CO2 21 20 - 29 mmol/L   Calcium 9.2 8.6 - 10.2 mg/dL  Vitamin B12  Result Value Ref Range   Vitamin B-12 1,617 (H) 232 - 1,245 pg/mL  Urinalysis, Routine w reflex microscopic  Result Value Ref Range   Specific Gravity, UA 1.010 1.005 - 1.030   pH, UA 6.0 5.0 - 7.5   Color, UA Yellow Yellow   Appearance Ur Clear Clear   Leukocytes,UA Negative Negative   Protein,UA Negative Negative/Trace   Glucose, UA 3+ (A) Negative   Ketones, UA Negative Negative   RBC, UA Negative Negative   Bilirubin, UA Negative Negative   Urobilinogen, Ur 0.2 0.2 - 1.0 mg/dL   Nitrite, UA Negative Negative      Assessment & Plan:   Problem List Items Addressed This Visit       Cardiovascular and Mediastinum   Hypertension associated with type 2 diabetes mellitus ([place]) - Primary    Chronic.  Blood pressure well controlled.  Continue with current medication regimen.  Labs ordered today.  Continue to follow up with Cardiology. Recommend patient make an appointment with Cardiology to follow up  on edema.        Relevant  Orders   Comprehensive metabolic panel   Lipid Profile   HgB A1c     Endocrine   Hyperlipidemia associated with type 2 diabetes mellitus (HCC)    Chronic.  Controlled.  Continue with current medication regimen.  Labs ordered today.  Return to clinic in 6 months for reevaluation.  Call sooner if concerns arise.         Relevant Orders   Comprehensive metabolic panel   Lipid Profile   HgB A1c   Diabetic peripheral neuropathy associated with type 2 diabetes mellitus ([place])    Stable.  Continue to follow up with pain management. Patient recently evaluated for shoes to help with neuropathy.       Relevant Orders   Comprehensive metabolic panel   Lipid Profile   HgB A1c   Type 2 diabetes mellitus with hyperglycemia, with long-term current use of insulin (HCC)    Chronic, ongoing.  Followed by endocrinology.  Labs ordered today. Continue current medication regimen as prescribed by them and review notes.  Blood sugars well controlled. Return in 3 months.        Relevant Orders   Comprehensive metabolic panel   Lipid Profile   HgB A1c   Other Visit Diagnoses     Lower extremity edema       Patient should continue to take Lasix 63m BID. Recommend seeing vascular surgeon for evaluation of Peripherial Vascular Disease. Referral placed.         Follow up plan: Return in about 6 months (around 01/28/2021) for HTN, HLD, DM2 FU.

## 2020-07-29 NOTE — Assessment & Plan Note (Signed)
Chronic, ongoing.  Followed by endocrinology.  Labs ordered today. Continue current medication regimen as prescribed by them and review notes.  Blood sugars well controlled. Return in 3 months.

## 2020-07-29 NOTE — Telephone Encounter (Signed)
Can we call and let Corey [name] know that I have placed a referral to Vascular surgery to have his swelling elevated. Due to his heart sounds and lung sounds being normal I think it is more related to a peripheral vascular disease and he should see Vascular Surgery. I have placed the referral.

## 2020-07-29 NOTE — Assessment & Plan Note (Signed)
Chronic.  Controlled.  Continue with current medication regimen.  Labs ordered today.  Return to clinic in 6 months for reevaluation.  Call sooner if concerns arise.  ? ?

## 2020-07-30 LAB — COMPREHENSIVE METABOLIC PANEL
ALT: 32 IU/L (ref 0–44)
AST: 25 IU/L (ref 0–40)
Albumin/Globulin Ratio: 2.8 — ABNORMAL HIGH (ref 1.2–2.2)
Albumin: 4.4 g/dL (ref 3.8–4.8)
Alkaline Phosphatase: 108 IU/L (ref 44–121)
BUN/Creatinine Ratio: 11 (ref 10–24)
BUN: 16 mg/dL (ref 8–27)
Bilirubin Total: 0.3 mg/dL (ref 0.0–1.2)
CO2: 23 mmol/L (ref 20–29)
Calcium: 8.9 mg/dL (ref 8.6–10.2)
Chloride: 101 mmol/L (ref 96–106)
Creatinine, Ser: 1.4 mg/dL — ABNORMAL HIGH (ref 0.76–1.27)
Globulin, Total: 1.6 g/dL (ref 1.5–4.5)
Glucose: 130 mg/dL — ABNORMAL HIGH (ref 65–99)
Potassium: 4.2 mmol/L (ref 3.5–5.2)
Sodium: 141 mmol/L (ref 134–144)
Total Protein: 6 g/dL (ref 6.0–8.5)
eGFR: 56 mL/min/{1.73_m2} — ABNORMAL LOW (ref >59)

## 2020-07-30 LAB — LIPID PANEL
Chol/HDL Ratio: 5 ratio (ref 0.0–5.0)
Cholesterol, Total: 159 mg/dL (ref 100–199)
HDL: 32 mg/dL — ABNORMAL LOW (ref >39)
LDL Chol Calc (NIH): 65 mg/dL (ref 0–99)
Triglycerides: 398 mg/dL — ABNORMAL HIGH (ref 0–149)
VLDL Cholesterol Cal: 62 mg/dL — ABNORMAL HIGH (ref 5–40)

## 2020-07-30 LAB — HEMOGLOBIN A1C
Est. average glucose Bld gHb Est-mCnc: 232 mg/dL
Hgb A1c MFr Bld: 9.7 % — ABNORMAL HIGH (ref 4.8–5.6)

## 2020-07-30 NOTE — Telephone Encounter (Signed)
Called patient, made aware of referral being placed.

## 2020-07-30 NOTE — Progress Notes (Signed)
Please let patient know that his kidney function worsened some from prior.  Triglycerides improved some but I still recommend decreasing processed foods and refined sugar.  A1c also increased to 9.7.  I recommend he reach out to his endocrinologist to have medications adjusted.  Please let me know if he has any questions.

## 2020-08-02 ENCOUNTER — Other Ambulatory Visit: Payer: Self-pay | Admitting: Family Medicine

## 2020-08-02 NOTE — Telephone Encounter (Signed)
Requested Prescriptions  Pending Prescriptions Disp Refills  . cetirizine (ZYRTEC) 10 MG tablet [Pharmacy Med Name: CETIRIZINE 10MG  TABLETS] 90 tablet 1    Sig: TAKE 1 TABLET BY MOUTH DAILY     Ear, Nose, and Throat:  Antihistamines Passed - 08/02/2020 10:46 AM      Passed - Valid encounter within last 12 months    Recent Outpatient Visits          4 days ago Hypertension associated with type 2 diabetes mellitus (HCC)   [facility], NP   5 days ago Type 2 diabetes mellitus with hyperglycemia, with long-term current use of insulin (HCC)   [facility] [place], Megan P, DO   3 months ago Coronary artery disease of native artery of native heart with stable angina pectoris (HCC)   [facility], NP   5 months ago Type 2 diabetes mellitus with hyperglycemia, with long-term current use of insulin (HCC)   [facility] [place], [place] T, NP   5 months ago Routine general medical examination at a health care facility   [facility], NP      Future Appointments            In 1 week Stoioff, [name], MD [facility]   In 2 months [facility], MD [facility]   In 2 months Deirdre Evener, NP [facility], [facility]   In 3 months  [facility], [facility]

## 2020-08-04 ENCOUNTER — Telehealth: Payer: Self-pay | Admitting: Licensed Clinical Social Worker

## 2020-08-04 ENCOUNTER — Other Ambulatory Visit: Payer: Self-pay | Admitting: Nurse Practitioner

## 2020-08-04 NOTE — Telephone Encounter (Signed)
    Clinical Social Work  Chronic Care Management   Phone Outreach    08/04/2020 Name: Corey [name] MRN: [id number] DOB: January 29, 1956  Corey [name] is a 65 y.o. year old male who is a primary care patient of [name], NP .   CCM LCSW reached out to patient today by phone to introduce self, assess needs and offer Care Management services and interventions.     Plan: Appointment was scheduled with CCM LCSW for 08/10/20  Review of patient status, including review of consultants reports, relevant laboratory and other test results, and collaboration with appropriate care team members and the patient's provider was performed as part of comprehensive patient evaluation and provision of care management services.    Jenel Lucks, MSW, LCSW [facility] Care [facility] [place]  [facility] [email or web address] Phone [phone] 1:27 PM

## 2020-08-09 ENCOUNTER — Other Ambulatory Visit: Payer: Self-pay | Admitting: Nurse Practitioner

## 2020-08-09 DIAGNOSIS — K219 Gastro-esophageal reflux disease without esophagitis: Secondary | ICD-10-CM

## 2020-08-10 ENCOUNTER — Telehealth: Payer: Self-pay | Admitting: Licensed Clinical Social Worker

## 2020-08-10 ENCOUNTER — Telehealth: Payer: Self-pay

## 2020-08-10 NOTE — Telephone Encounter (Signed)
    Clinical Social Work  Chronic Care Management   Phone Outreach    08/10/2020 Name: RIGDON MACOMBER MRN: [id number] DOB: Jul 17, 1955  PATRIC BUCKHALTER is a 65 y.o. year old male who is a primary care patient of [name], NP .   CCM LCSW reached out to patient today by phone to introduce self, assess needs and offer Care Management services and interventions.    Telephone outreach was unsuccessful A HIPPA compliant phone message was left for the patient providing contact information and requesting a return call.   Plan:CCM LCSW will wait for return call. If no return call is received, Will reach out to patient again in the next 30 days .   Review of patient status, including review of consultants reports, relevant laboratory and other test results, and collaboration with appropriate care team members and the patient's provider was performed as part of comprehensive patient evaluation and provision of care management services.    Jenel Lucks, MSW, LCSW [facility] [place]  [facility] [email or web address] Phone [phone] 4:14 PM

## 2020-08-12 ENCOUNTER — Other Ambulatory Visit: Payer: Self-pay

## 2020-08-12 ENCOUNTER — Other Ambulatory Visit: Payer: Medicare Other

## 2020-08-12 DIAGNOSIS — E291 Testicular hypofunction: Secondary | ICD-10-CM

## 2020-08-13 LAB — PSA: Prostate Specific Ag, Serum: 0.2 ng/mL (ref 0.0–4.0)

## 2020-08-13 LAB — HEMATOCRIT: Hematocrit: 46.8 % (ref 37.5–51.0)

## 2020-08-13 LAB — TESTOSTERONE: Testosterone: 74 ng/dL — ABNORMAL LOW (ref 264–916)

## 2020-08-14 ENCOUNTER — Ambulatory Visit: Payer: Self-pay | Admitting: Urology

## 2020-08-18 ENCOUNTER — Other Ambulatory Visit: Payer: Self-pay | Admitting: Nurse Practitioner

## 2020-08-18 NOTE — Telephone Encounter (Signed)
Requested medications are due for refill today.  yes  Requested medications are on the active medications list.  yes  Last refill. 02/06/2020  Future visit scheduled.   yes  Notes to clinic.  Medication not delegated.

## 2020-08-18 NOTE — Telephone Encounter (Signed)
Pt has apt on 10/29/2020

## 2020-08-20 ENCOUNTER — Other Ambulatory Visit: Payer: Self-pay

## 2020-08-20 ENCOUNTER — Ambulatory Visit (INDEPENDENT_AMBULATORY_CARE_PROVIDER_SITE_OTHER): Payer: Medicare Other | Admitting: Urology

## 2020-08-20 ENCOUNTER — Encounter: Payer: Self-pay | Admitting: Urology

## 2020-08-20 VITALS — BP 142/80 | HR 74 | Ht 68.0 in | Wt 212.0 lb

## 2020-08-20 DIAGNOSIS — E291 Testicular hypofunction: Secondary | ICD-10-CM | POA: Diagnosis not present

## 2020-08-20 NOTE — Progress Notes (Signed)
08/20/2020 10:27 AM   Lenna Sciara 1955/11/07 [id number]  Referring provider: Volney American, PA-C [place],  [place] [id number]  Chief Complaint  Patient presents with   Hypogonadism    Urologic history: 1.  Hypogonadism Initially seen 07/17/2019 Symptoms tiredness, fatigue, decreased libido Testosterone cypionate 300 mg every 2 weeks   HPI: 65 y.o. male presents for follow-up of hypogonadism.  Dose titrated to 300 mg July 2021 6 month interim testosterone level 422, HCT 50.4 and PSA 0.3 Reports improvement in his energy level and libido He was ~ 1 week late for his injection on blood drawl 08/12/2020 and testosterone level was 74 ng/dL.  Hematocrit 46.8 and PSA 0.2 His only other complaint was intermittent coughing spells and we discussed it would be better if he followed up with his PCP or pulmonary doctor to evaluate this problem   PMH: Past Medical History:  Diagnosis Date   Asthma    Diabetes ([place])    GERD (gastroesophageal reflux disease)    Hyperlipidemia    Hypertension    Legg-Perthes disease     Surgical History: Past Surgical History:  Procedure Laterality Date   CARDIAC CATHETERIZATION     COLONOSCOPY WITH PROPOFOL N/A 04/16/2019   Procedure: COLONOSCOPY WITH PROPOFOL;  Surgeon: Jonathon Bellows, MD;  Location[facility] ENDOSCOPY;  Service: Gastroenterology;  Laterality: N/A;   COLONOSCOPY WITH PROPOFOL N/A 06/14/2019   Procedure: COLONOSCOPY WITH PROPOFOL;  Surgeon: Jonathon Bellows, MD;  Location: [facility] ENDOSCOPY;  Service: Gastroenterology;  Laterality: N/A;   COLONOSCOPY WITH PROPOFOL N/A 07/30/2019   Procedure: COLONOSCOPY WITH PROPOFOL;  Surgeon: Jonathon Bellows, MD;  Location: [facility] ENDOSCOPY;  Service: Gastroenterology;  Laterality: N/A;   ESOPHAGOGASTRODUODENOSCOPY (EGD) WITH PROPOFOL N/A 04/16/2019   Procedure: ESOPHAGOGASTRODUODENOSCOPY (EGD) WITH PROPOFOL;  Surgeon: Jonathon Bellows, MD;  Location: [facility] ENDOSCOPY;  Service: Gastroenterology;   Laterality: N/A;   Foot Surgery     HIP SURGERY     RIGHT/LEFT HEART CATH AND CORONARY ANGIOGRAPHY N/A 07/25/2018   Procedure: RIGHT/LEFT HEART CATH AND CORONARY ANGIOGRAPHY;  Surgeon: Yolonda Kida, MD;  Location: [facility] LAB;  Service: Cardiovascular;  Laterality: N/A;    Home Medications:  Allergies as of 08/20/2020   No Known Allergies      Medication List        Accurate as of August 20, 2020 10:27 AM. If you have any questions, ask your nurse or doctor.          Accu-Chek [employer] Kit 1 Units by Does not apply route 2 (two) times daily.   Accu-Chek FastClix Lancets Misc USE TO CHECK BLOOD SUGAR AS DIRECTED   acetaminophen 500 MG tablet Commonly known as: TYLENOL Take 1,000 mg by mouth every 6 (six) hours as needed for moderate pain or headache.   albuterol 108 (90 Base) MCG/ACT inhaler Commonly known as: VENTOLIN HFA INHALE 2 PUFFS INTO THE LUNGS EVERY 6 HOURS AS NEEDED FOR WHEEZING OR SHORTNESS OF BREATH   albuterol (2.5 MG/3ML) 0.083% nebulizer solution Commonly known as: PROVENTIL USE 1 VIAL IN NEBULIZER EVERY 6 HOURS - and as needed.   amitriptyline 50 MG tablet Commonly known as: ELAVIL TAKE 1 TO 2 TABLETS(50 TO 100 MG) BY MOUTH AT BEDTIME   aspirin EC 81 MG tablet Take 81 mg by mouth daily.   azelastine 0.1 % nasal spray Commonly known as: ASTELIN USE 2 SPRAYS IN EACH NOSTRIL TWICE DAILY   B-D UF III MINI PEN NEEDLES 31G X 5  MM Misc Generic drug: Insulin Pen Needle USE TWICE DAILY   buPROPion 150 MG 24 hr tablet Commonly known as: WELLBUTRIN XL TAKE 1 TABLET(150 MG) BY MOUTH DAILY   cetirizine 10 MG tablet Commonly known as: ZYRTEC TAKE 1 TABLET BY MOUTH DAILY   citalopram 10 MG tablet Commonly known as: CELEXA TAKE 1 TABLET BY MOUTH  DAILY   clopidogrel 75 MG tablet Commonly known as: PLAVIX Take 75 mg by mouth daily.   diclofenac sodium 1 % Gel Commonly known as: VOLTAREN Apply 1 application topically 4 (four)  times daily as needed (pain).   famotidine 40 MG tablet Commonly known as: PEPCID TAKE 1 TABLET(40 MG) BY MOUTH DAILY   fenofibrate 145 MG tablet Commonly known as: Tricor Take 1 tablet (145 mg total) by mouth daily.   furosemide 20 MG tablet Commonly known as: LASIX TAKE 1 TABLET(20 MG) BY MOUTH TWICE DAILY   glucose blood test strip Use to check blood sugar 2-3 times daily and document for provider visits.   isosorbide mononitrate 60 MG 24 hr tablet Commonly known as: IMDUR Take 60 mg by mouth daily.   Jardiance 25 MG Tabs tablet Generic drug: empagliflozin Take 1 tablet (25 mg total) by mouth daily.   Lantus SoloStar 100 UNIT/ML Solostar Pen Generic drug: insulin glargine Inject 85 Units into the skin at bedtime.   losartan 50 MG tablet Commonly known as: COZAAR Take 1 tablet (50 mg total) by mouth daily.   Luer Lock Safety Syringes 21G X 1-1/2" 3 ML Misc Generic drug: SYRINGE-NEEDLE (DISP) 3 ML Use as directed for testosterone administration   magnesium gluconate 500 MG tablet Commonly known as: MAGONATE Take 500 mg by mouth daily.   meloxicam 7.5 MG tablet Commonly known as: MOBIC TAKE 1 TABLET(7.5 MG) BY MOUTH DAILY   metFORMIN 1000 MG tablet Commonly known as: GLUCOPHAGE TAKE 1 TABLET BY MOUTH  TWICE DAILY   metoprolol succinate 25 MG 24 hr tablet Commonly known as: TOPROL-XL Take 25 mg by mouth daily.   montelukast 10 MG tablet Commonly known as: SINGULAIR TAKE 1 TABLET(10 MG) BY MOUTH DAILY What changed: See the new instructions.   NEEDLE (DISP) 18 G 18G X 1" Misc Use as directed   OneTouch Verio w/Device Kit Use to check blood sugar 2-3 times daily and document for provider visits.   pantoprazole 40 MG tablet Commonly known as: PROTONIX Take 1 tablet (40 mg total) by mouth 2 (two) times daily before a meal.   pregabalin 150 MG capsule Commonly known as: Lyrica Take 1 capsule (150 mg total) by mouth 2 (two) times daily.   primidone 50  MG tablet Commonly known as: MYSOLINE TAKE 2 TABLETS(100 MG) BY MOUTH DAILY   rosuvastatin 20 MG tablet Commonly known as: CRESTOR TAKE 1 TABLET BY MOUTH  DAILY   Semaglutide (1 MG/DOSE) 4 MG/3ML Sopn Inject 1 mg into the skin once a week.   testosterone cypionate 200 MG/ML injection Commonly known as: DEPOTESTOSTERONE CYPIONATE INJECT 1 ML IN THE MUSCLE EVERY 14 DAYS   tiotropium 18 MCG inhalation capsule Commonly known as: [place] into inhaler and inhale.   tiZANidine 4 MG tablet Commonly known as: Zanaflex Take 1 tablet (4 mg total) by mouth every 8 (eight) hours as needed for muscle spasms.   triamcinolone 0.025 % ointment Commonly known as: KENALOG Apply 1 application topically 2 (two) times daily.   Vitamin D3 125 MCG (5000 UT) Tabs Take 5,000 Units by mouth daily.  Wixela Inhub 250-50 MCG/ACT Aepb Generic drug: fluticasone-salmeterol Inhale 1 puff into the lungs 2 (two) times daily.        Allergies: No Known Allergies  Family History: Family History  Problem Relation Age of Onset   Cancer Mother    Heart disease Mother    Diabetes Father     Social History:  reports that he quit smoking about 40 years ago. His smoking use included cigarettes. He has a 1.00 pack-year smoking history. His smokeless tobacco use includes chew. He reports previous alcohol use. He reports that he does not use drugs.   Physical Exam: BP (!) 142/80   Pulse 74   Ht _0  (1.727 m)   Wt 212 lb (96.2 kg)   BMI 32.23 kg/m   Constitutional:  Alert and oriented, No acute distress. HEENT: [place] AT, moist mucus membranes.  Trachea midline, no masses. Cardiovascular: No clubbing, cyanosis, or edema. Respiratory: Normal respiratory effort, no increased work of breathing.   Assessment & Plan:    1.  Hypogonadism Recent level low however he was 1 week late for his injection Will continue present dose Testosterone refilled Lab visit 6 months for testosterone/HCT Office  visit 1 year with testosterone/HCT/PSA   Abbie Sons, MD  [facility] [address] [place], [place] [address] [phone]

## 2020-08-22 ENCOUNTER — Encounter: Payer: Self-pay | Admitting: Urology

## 2020-08-22 MED ORDER — TESTOSTERONE CYPIONATE 200 MG/ML IM SOLN: 300.0000 mg | INTRAMUSCULAR | 0 refills | Status: DC

## 2020-08-31 ENCOUNTER — Other Ambulatory Visit: Payer: Self-pay | Admitting: Nurse Practitioner

## 2020-08-31 NOTE — Telephone Encounter (Signed)
Requested Prescriptions  Pending Prescriptions Disp Refills  . meloxicam (MOBIC) 7.5 MG tablet [Pharmacy Med Name: MELOXICAM 7.5MG  TABLETS] 90 tablet 0    Sig: TAKE 1 TABLET(7.5 MG) BY MOUTH DAILY     Analgesics:  COX2 Inhibitors Failed - 08/31/2020 11:50 AM      Failed - HGB in normal range and within 360 days    Hemoglobin  Date Value Ref Range Status  02/24/2020 19.2 (H) 13.0 - 17.7 g/dL Final         Failed - Cr in normal range and within 360 days    Creatinine, Ser  Date Value Ref Range Status  07/29/2020 1.40 (H) 0.76 - 1.27 mg/dL Final         Passed - Patient is not pregnant      Passed - Valid encounter within last 12 months    Recent Outpatient Visits          1 month ago Hypertension associated with type 2 diabetes mellitus (HCC)   [facility] [name], NP   1 month ago Type 2 diabetes mellitus with hyperglycemia, with long-term current use of insulin (HCC)   [facility] [place], Megan P, DO   4 months ago Coronary artery disease of native artery of native heart with stable angina pectoris (HCC)   [facility] [name], NP   6 months ago Type 2 diabetes mellitus with hyperglycemia, with long-term current use of insulin (HCC)   [facility] [place], Corrie Dandy T, NP   6 months ago Routine general medical examination at a health care facility   [facility] [name], NP      Future Appointments            In 1 month Deirdre Evener, MD [facility]   In 1 month [name], NP [facility], [facility]   In 2 months  [facility], [facility]   In 11 months Stoioff, Verna Czech, MD [facility]

## 2020-09-08 ENCOUNTER — Ambulatory Visit
Payer: Medicare Other | Attending: Student in an Organized Health Care Education/Training Program | Admitting: Student in an Organized Health Care Education/Training Program

## 2020-09-08 ENCOUNTER — Encounter: Payer: Self-pay | Admitting: Student in an Organized Health Care Education/Training Program

## 2020-09-08 ENCOUNTER — Other Ambulatory Visit: Payer: Self-pay

## 2020-09-08 VITALS — BP 122/74 | HR 80 | Temp 96.9°F | Resp 16 | Ht 68.0 in | Wt 220.0 lb

## 2020-09-08 DIAGNOSIS — G8929 Other chronic pain: Secondary | ICD-10-CM | POA: Insufficient documentation

## 2020-09-08 DIAGNOSIS — M5441 Lumbago with sciatica, right side: Secondary | ICD-10-CM | POA: Insufficient documentation

## 2020-09-08 DIAGNOSIS — M533 Sacrococcygeal disorders, not elsewhere classified: Secondary | ICD-10-CM | POA: Insufficient documentation

## 2020-09-08 DIAGNOSIS — M5442 Lumbago with sciatica, left side: Secondary | ICD-10-CM | POA: Diagnosis present

## 2020-09-08 DIAGNOSIS — M5136 Other intervertebral disc degeneration, lumbar region: Secondary | ICD-10-CM | POA: Diagnosis present

## 2020-09-08 DIAGNOSIS — G894 Chronic pain syndrome: Secondary | ICD-10-CM | POA: Insufficient documentation

## 2020-09-08 DIAGNOSIS — M47816 Spondylosis without myelopathy or radiculopathy, lumbar region: Secondary | ICD-10-CM | POA: Insufficient documentation

## 2020-09-08 DIAGNOSIS — E1141 Type 2 diabetes mellitus with diabetic mononeuropathy: Secondary | ICD-10-CM | POA: Diagnosis present

## 2020-09-08 MED ORDER — PREGABALIN 150 MG PO CAPS: 150.0000 mg | ORAL_CAPSULE | Freq: Two times a day (BID) | ORAL | 1 refills | Status: AC

## 2020-09-08 MED ORDER — TIZANIDINE HCL 4 MG PO TABS: 4.0000 mg | ORAL_TABLET | Freq: Three times a day (TID) | ORAL | 1 refills | Status: AC | PRN

## 2020-09-08 NOTE — Progress Notes (Signed)
PROVIDER NOTE: Information contained herein reflects review and annotations entered in association with encounter. Interpretation of such information and data should be left to medically-trained personnel. Information provided to patient can be located elsewhere in the medical record under "Patient Instructions". Document created using STT-dictation technology, any transcriptional errors that may result from process are unintentional.    Patient: Corey [name]  Service Category: E/M  Provider: Gillis Santa, MD  DOB: 1955-12-04  DOS: 09/08/2020  Specialty: Interventional Pain Management  MRN: [id number]  Setting: Ambulatory outpatient  PCP: Corey Billings, NP  Type: Established Patient    Referring Provider: Jon Billings, NP  Location: Office  Delivery: Face-to-face     HPI  [name], a 65 y.o. year old male, is here today because of his Lumbar degenerative disc disease [M51.36]. [name] primary complain today is No chief complaint on file. Last encounter: My last encounter with him was on 03/12/20 Pertinent problems: [name] has Hyperlipidemia associated with type 2 diabetes mellitus ([place]); RLS (restless legs syndrome); GERD (gastroesophageal reflux disease); Chronic bilateral low back pain with bilateral sciatica; Lumbar degenerative disc disease; Lumbar facet arthropathy; Sacroiliac joint pain; Chronic pain syndrome; and Bilateral primary osteoarthritis of knee on their pertinent problem list. Pain Assessment: Severity of   is reported as a 0-No pain/10. Location:    / . Onset:  . Quality:  . Timing:  . Modifying factor(s):  Marland Kitchen Vitals:  height is 5' 8"  (1.727 m) and weight is 220 lb (99.8 kg). His temporal temperature is 96.9 F (36.1 C) (abnormal). His blood pressure is 122/74 and his pulse is 80. His respiration is 16 and oxygen saturation is 96%.   Reason for encounter: medication management.    No change in medical history since last visit.  Patient's pain is at  baseline.  Patient continues multimodal pain regimen as prescribed.  States that it provides pain relief and improvement in functional status.    ROS  Constitutional: Denies any fever or chills Gastrointestinal: No reported hemesis, hematochezia, vomiting, or acute GI distress Musculoskeletal:  +LBP Neurological: No reported episodes of acute onset apraxia, aphasia, dysarthria, agnosia, amnesia, paralysis, loss of coordination, or loss of consciousness  Medication Review  Accu-Chek FastClix Lancet, Accu-Chek FastClix Lancets, Insulin Pen Needle, NEEDLE (DISP) 18 G, OneTouch Verio, SYRINGE-NEEDLE (DISP) 3 ML, Semaglutide (1 MG/DOSE), Vitamin D3, acetaminophen, albuterol, amitriptyline, aspirin EC, azelastine, buPROPion, cetirizine, citalopram, clopidogrel, diclofenac sodium, empagliflozin, famotidine, fenofibrate, fluticasone-salmeterol, furosemide, glucose blood, insulin glargine, isosorbide mononitrate, losartan, magnesium gluconate, meloxicam, metFORMIN, metoprolol succinate, montelukast, pantoprazole, pregabalin, primidone, rosuvastatin, testosterone cypionate, tiZANidine, tiotropium, and triamcinolone  History Review  Allergy: [name] has No Known Allergies. Drug: [name]  reports no history of drug use. Alcohol:  reports previous alcohol use. Tobacco:  reports that he quit smoking about 40 years ago. His smoking use included cigarettes. He has a 1.00 pack-year smoking history. His smokeless tobacco use includes chew. Social: [name]  reports that he quit smoking about 40 years ago. His smoking use included cigarettes. He has a 1.00 pack-year smoking history. His smokeless tobacco use includes chew. He reports previous alcohol use. He reports that he does not use drugs. Medical:  has a past medical history of Asthma, Diabetes ([place]), GERD (gastroesophageal reflux disease), Hyperlipidemia, Hypertension, and Legg-Perthes disease. Surgical: [name]  has a past surgical history  that includes Hip surgery; Foot Surgery; RIGHT/LEFT HEART CATH AND CORONARY ANGIOGRAPHY (N/A, 07/25/2018); Cardiac catheterization; Colonoscopy with propofol (N/A, 04/16/2019); Esophagogastroduodenoscopy (egd) with  propofol (N/A, 04/16/2019); Colonoscopy with propofol (N/A, 06/14/2019); and Colonoscopy with propofol (N/A, 07/30/2019). Family: family history includes Cancer in his mother; Diabetes in his father; Heart disease in his mother.  Laboratory Chemistry Profile   Renal Lab Results  Component Value Date   BUN 16 07/29/2020   CREATININE 1.40 (H) 07/29/2020   BCR 11 07/29/2020   GFRAA 63 02/24/2020   GFRNONAA 55 (L) 02/24/2020     Hepatic Lab Results  Component Value Date   AST 25 07/29/2020   ALT 32 07/29/2020   ALBUMIN 4.4 07/29/2020   ALKPHOS 108 07/29/2020   LIPASE 18 01/11/2020     Electrolytes Lab Results  Component Value Date   NA 141 07/29/2020   K 4.2 07/29/2020   CL 101 07/29/2020   CALCIUM 8.9 07/29/2020   MG 2.2 01/14/2020     Bone Lab Results  Component Value Date   TESTOFREE 4.2 (L) 06/24/2019   TESTOSTERONE 74 (L) 08/12/2020     Inflammation (CRP: Acute Phase) (ESR: Chronic Phase) Lab Results  Component Value Date   CRP 7.3 (H) 01/14/2020       Note: Above Lab results reviewed.  Recent Imaging Review  CT Angio Chest PE W and/or Wo Contrast CLINICAL DATA:  Headache and vomiting for 2 days, short of breath  EXAM: CT ANGIOGRAPHY CHEST WITH CONTRAST  TECHNIQUE: Multidetector CT imaging of the chest was performed using the standard protocol during bolus administration of intravenous contrast. Multiplanar CT image reconstructions and MIPs were obtained to evaluate the vascular anatomy.  CONTRAST:  133m OMNIPAQUE IOHEXOL 350 MG/ML SOLN  COMPARISON:  Chest x-ray 01/11/2020  FINDINGS: Cardiovascular: Satisfactory opacification of the pulmonary arteries to the segmental level. No evidence of pulmonary embolism. The main pulmonary artery is  normal in caliber. Normal heart size. No significant pericardial effusion. The thoracic aorta is normal in caliber. Mild atherosclerotic plaque of the thoracic aorta. Moderate left anterior descending coronary artery calcifications and mild right and left circumflex coronary artery calcifications.  Mediastinum/Nodes: Bilateral 1 cm hilar lymph nodes. No enlarged mediastinal or axillary lymph nodes. Thyroid gland, trachea, and esophagus demonstrate no significant findings.  Lungs/Pleura: Diffuse ground-glass airspace opacities. No pleural effusion. No pneumothorax.  Upper Abdomen: A 2.1 cm hepatic lobe fluid density lesion likely represents a hepatic cyst (4:70). No acute abnormality.  Musculoskeletal:  No chest wall abnormality.  No suspicious lytic or blastic osseous lesions. Cortical irregularity of the right lateral tenth rib likely represents an old healed fracture. No acute displaced fracture. Multilevel degenerative changes of the spine.  Review of the MIP images confirms the above findings.  IMPRESSION: 1. Multifocal pneumonia with findings suggestive of COVID-19 infection. Followup PA and lateral chest X-ray is recommended in 3-4 weeks following therapy to ensure resolution and exclude underlying malignancy. 2. Bilateral hilar lymphadenopathy likely reactive in etiology. 3. Mild to moderate three-vessel coronary artery calcifications. 4.  Aortic Atherosclerosis (ICD10-I70.0).  Electronically Signed   By: MIven FinnM.D.   On: 01/11/2020 18:40 DG Chest 1 View CLINICAL DATA:  Shortness of breath.  EXAM: CHEST  1 VIEW  COMPARISON:  April 05, 2018.  FINDINGS: The heart size and mediastinal contours are within normal limits. No pneumothorax or pleural effusion is noted. Right midlung and basilar opacities are noted concerning for pneumonia. Minimal left basilar subsegmental atelectasis is noted. The visualized skeletal structures are  unremarkable.  IMPRESSION: Right midlung and basilar opacities are noted concerning for pneumonia. Minimal left basilar subsegmental atelectasis.  Electronically Signed  By: Marijo Conception M.D.   On: 01/11/2020 16:49 Note: Reviewed        Physical Exam  General appearance: Well nourished, well developed, and well hydrated. In no apparent acute distress Mental status: Alert, oriented x 3 (person, place, & time)       Respiratory: No evidence of acute respiratory distress Eyes: PERLA Vitals: BP 122/74 (BP Location: Right Arm, Patient Position: Sitting, Cuff Size: Large)   Pulse 80   Temp (!) 96.9 F (36.1 C) (Temporal)   Resp 16   Ht 5' 8"  (1.727 m)   Wt 220 lb (99.8 kg)   SpO2 96%   BMI 33.45 kg/m  BMI: Estimated body mass index is 33.45 kg/m as calculated from the following:   Height as of this encounter: 5' 8"  (1.727 m).   Weight as of this encounter: 220 lb (99.8 kg). Ideal: Ideal body weight: 68.4 kg (150 lb 12.7 oz) Adjusted ideal body weight: 81 kg (178 lb 7.6 oz)  Lumbar Spine Area Exam  Skin & Axial Inspection: No masses, redness, or swelling Alignment: Symmetrical Functional ROM: Unrestricted ROM       Stability: No instability detected Muscle Tone/Strength: Functionally intact. No obvious neuro-muscular anomalies detected. Sensory (Neurological): Musculoskeletal pain pattern    Assessment   Status Diagnosis  Controlled Controlled Controlled 1. Lumbar degenerative disc disease   2. Diabetic mononeuropathy associated with type 2 diabetes mellitus ([place])   3. Chronic bilateral low back pain with bilateral sciatica   4. Sacroiliac joint pain   5. Lumbar facet arthropathy   6. Chronic pain syndrome       Plan of Care  [name] has a current medication list which includes the following long-term medication(s): albuterol, albuterol, amitriptyline, azelastine, bupropion, cetirizine, citalopram, famotidine, fenofibrate, furosemide, isosorbide  mononitrate, lantus solostar, losartan, metformin, metoprolol succinate, montelukast, pantoprazole, pregabalin, primidone, rosuvastatin, testosterone cypionate, tiotropium, and wixela inhub.  Pharmacotherapy (Medications Ordered): Meds ordered this encounter  Medications   pregabalin (LYRICA) 150 MG capsule    Sig: Take 1 capsule (150 mg total) by mouth 2 (two) times daily.    Dispense:  180 capsule    Refill:  1    Fill one day early if pharmacy is closed on scheduled refill date. May substitute for generic if available.   tiZANidine (ZANAFLEX) 4 MG tablet    Sig: Take 1 tablet (4 mg total) by mouth every 8 (eight) hours as needed for muscle spasms.    Dispense:  180 tablet    Refill:  1   Follow-up plan:   Return in about 6 months (around 03/11/2021) for Medication Management, in person.   Recent Visits No visits were found meeting these conditions. Showing recent visits within past 90 days and meeting all other requirements Today's Visits Date Type Provider Dept  09/08/20 Office Visit Corey Santa, MD [facility]  Showing today's visits and meeting all other requirements Future Appointments No visits were found meeting these conditions. Showing future appointments within next 90 days and meeting all other requirements I discussed the assessment and treatment plan with the patient. The patient was provided an opportunity to ask questions and all were answered. The patient agreed with the plan and demonstrated an understanding of the instructions.  Patient advised to call back or seek an in-person evaluation if the symptoms or condition worsens.  Duration of encounter: 30 minutes.  Note by: Corey Santa, MD Date: 09/08/2020; Time: 12:07 PM

## 2020-09-10 ENCOUNTER — Other Ambulatory Visit: Payer: Self-pay

## 2020-09-10 ENCOUNTER — Ambulatory Visit (INDEPENDENT_AMBULATORY_CARE_PROVIDER_SITE_OTHER): Payer: Medicare Other | Admitting: Podiatry

## 2020-09-10 ENCOUNTER — Encounter: Payer: Self-pay | Admitting: Podiatry

## 2020-09-10 DIAGNOSIS — L603 Nail dystrophy: Secondary | ICD-10-CM

## 2020-09-10 DIAGNOSIS — M79675 Pain in left toe(s): Secondary | ICD-10-CM

## 2020-09-10 DIAGNOSIS — E1159 Type 2 diabetes mellitus with other circulatory complications: Secondary | ICD-10-CM | POA: Diagnosis not present

## 2020-09-10 DIAGNOSIS — B351 Tinea unguium: Secondary | ICD-10-CM | POA: Diagnosis not present

## 2020-09-10 DIAGNOSIS — M79674 Pain in right toe(s): Secondary | ICD-10-CM | POA: Diagnosis not present

## 2020-09-10 MED ORDER — KETOROLAC TROMETHAMINE 10 MG PO TABS: 10.0000 mg | ORAL_TABLET | Freq: Four times a day (QID) | ORAL | 0 refills | Status: AC | PRN

## 2020-09-10 MED ORDER — CICLOPIROX 8 % EX SOLN: Freq: Every day | CUTANEOUS | 0 refills | Status: AC

## 2020-09-10 NOTE — Progress Notes (Signed)
  Subjective:  Patient ID: YOUSAF [name], male    DOB: 11-11-1955,  MRN: [id number]  No chief complaint on file.  65 y.o. male returns for the above complaint.  Patient presents with thickened elongated dystrophic toenails x10.  Mild pain on palpation.  Patient would like to have them debrided down.  There is mycotic nature to it.  Patient is a diabetic with last A1c of 8%.  He has secondary complaint of treating the nail fungus.  He has not tried much for it.  He would like to try topical application he does not want to take an oral medication.  He is a diabetic with uncontrolled A1c  Objective:  There were no vitals filed for this visit. Podiatric Exam: Vascular: dorsalis pedis and posterior tibial pulses are palpable bilateral. Capillary return is immediate. Temperature gradient is WNL. Skin turgor WNL  Sensorium: Decreased Semmes Weinstein monofilament test.  Decreased tactile sensation bilaterally. Nail Exam: Pt has thick disfigured discolored nails with subungual debris noted bilateral entire nail hallux through fifth toenails.  Pain on palpation to the nails. Ulcer Exam: There is no evidence of ulcer or pre-ulcerative changes or infection. Orthopedic Exam: Muscle tone and strength are WNL. No limitations in general ROM. No crepitus or effusions noted. HAV  B/L.  Hammer toes 2-5  B/L.  Pain with range of motion of bilateral ankle joint deep intra-articular pain noted.  No crepitus could be palpated.  No pain at the posterior tibial tendon, peroneal tendon, Achilles tendon. Skin: No Porokeratosis. No infection or ulcers    Assessment & Plan:   1. Onychodystrophy   2. Type 2 diabetes mellitus with vascular disease (HCC)   3. Pain due to onychomycosis of toenails of both feet      Patient was evaluated and treated and all questions answered.  Bilateral ankle capsulitis -I clinically healing   Hammertoe contractures 2 through 5 bilaterally -I explained to patient the etiology of  contracture as well as treatment options were discussed.  Given that he is a uncontrolled diabetic with A1c of 10% he will benefit from diabetic shoes.  He will be scheduled to see EJ for diabetic shoes.  Onychomycosis with pain with underlying nail dystrophy -Nails palliatively debrided as below. -Educated on self-care  Procedure: Nail Debridement Rationale: pain  Type of Debridement: manual, sharp debridement. Instrumentation: Nail nipper, rotary burr. Number of Nails: 10 -Penlac was dispensed to help with nail dystrophy  Procedures and Treatment: Consent by patient was obtained for treatment procedures. The patient understood the discussion of treatment and procedures well. All questions were answered thoroughly reviewed. Debridement of mycotic and hypertrophic toenails, 1 through 5 bilateral and clearing of subungual debris. No ulceration, no infection noted.  Return Visit-Office Procedure: Patient instructed to return to the office for a follow up visit 3 months for continued evaluation and treatment.  Nicholes Rough, DPM    No follow-ups on file.

## 2020-09-15 ENCOUNTER — Telehealth: Payer: Medicare Other | Admitting: General Practice

## 2020-09-15 ENCOUNTER — Ambulatory Visit (INDEPENDENT_AMBULATORY_CARE_PROVIDER_SITE_OTHER): Payer: Medicare Other | Admitting: General Practice

## 2020-09-15 DIAGNOSIS — E1159 Type 2 diabetes mellitus with other circulatory complications: Secondary | ICD-10-CM | POA: Diagnosis not present

## 2020-09-15 DIAGNOSIS — E782 Mixed hyperlipidemia: Secondary | ICD-10-CM

## 2020-09-15 DIAGNOSIS — F3341 Major depressive disorder, recurrent, in partial remission: Secondary | ICD-10-CM | POA: Diagnosis not present

## 2020-09-15 DIAGNOSIS — E1165 Type 2 diabetes mellitus with hyperglycemia: Secondary | ICD-10-CM

## 2020-09-15 DIAGNOSIS — F4321 Adjustment disorder with depressed mood: Secondary | ICD-10-CM | POA: Diagnosis not present

## 2020-09-15 DIAGNOSIS — I152 Hypertension secondary to endocrine disorders: Secondary | ICD-10-CM

## 2020-09-15 DIAGNOSIS — I25118 Atherosclerotic heart disease of native coronary artery with other forms of angina pectoris: Secondary | ICD-10-CM

## 2020-09-15 DIAGNOSIS — Z794 Long term (current) use of insulin: Secondary | ICD-10-CM

## 2020-09-15 DIAGNOSIS — E1169 Type 2 diabetes mellitus with other specified complication: Secondary | ICD-10-CM

## 2020-09-15 DIAGNOSIS — E785 Hyperlipidemia, unspecified: Secondary | ICD-10-CM

## 2020-09-15 NOTE — Patient Instructions (Signed)
Visit Information  PATIENT GOALS:  Goals Addressed             This Visit's Progress    RNCM: Monitor and Manage My Blood Sugar-Diabetes Type 2       Timeframe:  Long-Range Goal Priority:  High Start Date: 07-21-2020                            Expected End Date:        07-31-2021               Follow Up Date 11-10-2020    - check blood sugar at prescribed times - check blood sugar before and after exercise - check blood sugar if I feel it is too high or too low - enter blood sugar readings and medication or insulin into daily log - take the blood sugar log to all doctor visits    Why is this important?   Checking your blood sugar at home helps to keep it from getting very high or very low.  Writing the results in a diary or log helps the doctor know how to care for you.  Your blood sugar log should have the time, date and the results.  Also, write down the amount of insulin or other medicine that you take.  Other information, like what you ate, exercise done and how you were feeling, will also be helpful.     Notes: The patient is checking blood sugars BID- working on goals to get fasting <130 and post prandial of <180. 09-15-2020: The patient saw endocrinologist on 08-31-2020 and new A1C was 8.9%. Patient states his blood sugars have been 150-160's. Denies lows this month. Education and support given. Will continue to monitor.      COMPLETED: RNCM: Perform Foot Care-Diabetes Type 2       Timeframe:  Short-Term Goal Priority:  Medium Start Date:                             Expected End Date:  09-29-2020                     Follow Up Date 09/15/2020    - check feet daily for cuts, sores or redness - keep feet up while sitting - trim toenails straight across - wash and dry feet carefully every day - wear comfortable, cotton socks - wear comfortable, well-fitting shoes    Why is this important?   Good foot care is very important when you have diabetes.  There are many things  you can do to keep your feet healthy and catch a problem early.    Notes: Saw podiatrist on 06-09-2020 for trimming of nails and foot evaluation. Will continue to monitor. 8-16-2022Malvin Johns podiatrist on 09-10-2020. Is seeing regularly. No new concerns with feet. Will close this goal at this time. Will continue to monitor for new concerns.      RNCM: Track and Manage My Symptoms-Depression       Timeframe:  Long-Range Goal Priority:  High Start Date:   07-21-2020                          Expected End Date:      07-21-2021                Follow Up Date 11/10/2020    -  avoid negative self-talk - develop a personal safety plan - develop a plan to deal with triggers like holidays, anniversaries - exercise at least 2 to 3 times per week - have a plan for how to handle bad days - spend time or talk with others at least 2 to 3 times per week - spend time or talk with others every day    Why is this important?   Keeping track of your progress will help your treatment team find the right mix of medicine and therapy for you.  Write in your journal every day.  Day-to-day changes in depression symptoms are normal. It may be more helpful to check your progress at the end of each week instead of every day.     Notes: recent death of his mother on 07-21-2020 and his wife is actively going through chemo for lung cancer. 09-15-2020: The patient is doing okay. Still very concerned about his wife. She is currently in the hospital in [facility]. She now has cancer in her spine also. The patient states he is eating better. Empathetic listening and support given.         Patient verbalizes understanding of instructions provided today and agrees to view in MyChart.   Telephone follow up appointment with care management team member scheduled for: 11-10-2020 at 0945 am  Alto Denver RN, MSN, CCM Community Care Coordinator [place]  [facility] Mobile: [phone]

## 2020-09-15 NOTE — Chronic Care Management (AMB) (Signed)
Chronic Care Management   CCM RN Visit Note  09/15/2020 Name: Corey [name] MRN: [id number] DOB: [birth year]  Subjective: [name] is a 65 y.o. year old male who is a primary care patient of Jon Billings, NP. The care management team was consulted for assistance with disease management and care coordination needs.    Engaged with patient by telephone for follow up visit in response to provider referral for case management and/or care coordination services.   Consent to Services:  The patient was given information about Chronic Care Management services, agreed to services, and gave verbal consent prior to initiation of services.  Please see initial visit note for detailed documentation.   Patient agreed to services and verbal consent obtained.   Assessment: Review of patient past medical history, allergies, medications, health status, including review of consultants reports, laboratory and other test data, was performed as part of comprehensive evaluation and provision of chronic care management services.   SDOH (Social Determinants of Health) assessments and interventions performed:    CCM Care Plan  No Known Allergies  Outpatient Encounter Medications as of 09/15/2020  Medication Sig   Accu-Chek FastClix Lancets MISC USE TO CHECK BLOOD SUGAR AS DIRECTED   acetaminophen (TYLENOL) 500 MG tablet Take 1,000 mg by mouth every 6 (six) hours as needed for moderate pain or headache.   albuterol (PROVENTIL) (2.5 MG/3ML) 0.083% nebulizer solution USE 1 VIAL IN NEBULIZER EVERY 6 HOURS - and as needed.   albuterol (VENTOLIN HFA) 108 (90 Base) MCG/ACT inhaler INHALE 2 PUFFS INTO THE LUNGS EVERY 6 HOURS AS NEEDED FOR WHEEZING OR SHORTNESS OF BREATH (Patient taking differently: Inhale 2 puffs into the lungs every 6 (six) hours as needed for wheezing or shortness of breath.)   amitriptyline (ELAVIL) 50 MG tablet TAKE 1 TO 2 TABLETS(50 TO 100 MG) BY MOUTH AT BEDTIME   aspirin EC 81 MG  tablet Take 81 mg by mouth daily.   azelastine (ASTELIN) 0.1 % nasal spray USE 2 SPRAYS IN EACH NOSTRIL TWICE DAILY (Patient taking differently: Place 2 sprays into both nostrils 2 (two) times daily.)   B-D UF III MINI PEN NEEDLES 31G X 5 MM MISC USE TWICE DAILY   Blood Glucose Monitoring Suppl (ONETOUCH VERIO) w/Device KIT Use to check blood sugar 2-3 times daily and document for provider visits.   buPROPion (WELLBUTRIN XL) 150 MG 24 hr tablet TAKE 1 TABLET(150 MG) BY MOUTH DAILY   cetirizine (ZYRTEC) 10 MG tablet TAKE 1 TABLET BY MOUTH DAILY   Cholecalciferol (VITAMIN D3) 5000 units TABS Take 5,000 Units by mouth daily.    ciclopirox (PENLAC) 8 % solution Apply topically at bedtime. Apply over nail and surrounding skin. Apply daily over previous coat. After seven (7) days, may remove with alcohol and continue cycle.   citalopram (CELEXA) 10 MG tablet TAKE 1 TABLET BY MOUTH  DAILY   clopidogrel (PLAVIX) 75 MG tablet Take 75 mg by mouth daily.   diclofenac sodium (VOLTAREN) 1 % GEL Apply 1 application topically 4 (four) times daily as needed (pain).    famotidine (PEPCID) 40 MG tablet TAKE 1 TABLET(40 MG) BY MOUTH DAILY   fenofibrate (TRICOR) 145 MG tablet Take 1 tablet (145 mg total) by mouth daily.   furosemide (LASIX) 20 MG tablet TAKE 1 TABLET(20 MG) BY MOUTH TWICE DAILY   glucose blood test strip Use to check blood sugar 2-3 times daily and document for provider visits.   isosorbide mononitrate (IMDUR) 60 MG 24 hr tablet  Take 60 mg by mouth daily.   JARDIANCE 25 MG TABS tablet Take 1 tablet (25 mg total) by mouth daily.   ketorolac (TORADOL) 10 MG tablet Take 1 tablet (10 mg total) by mouth every 6 (six) hours as needed.   Lancets Misc. (ACCU-CHEK FASTCLIX LANCET) KIT 1 Units by Does not apply route 2 (two) times daily.   LANTUS SOLOSTAR 100 UNIT/ML Solostar Pen Inject 85 Units into the skin at bedtime.   losartan (COZAAR) 50 MG tablet Take 1 tablet (50 mg total) by mouth daily.   magnesium  gluconate (MAGONATE) 500 MG tablet Take 500 mg by mouth daily.   meloxicam (MOBIC) 7.5 MG tablet TAKE 1 TABLET(7.5 MG) BY MOUTH DAILY   metFORMIN (GLUCOPHAGE) 1000 MG tablet TAKE 1 TABLET BY MOUTH  TWICE DAILY   metoprolol succinate (TOPROL-XL) 25 MG 24 hr tablet Take 25 mg by mouth daily.   montelukast (SINGULAIR) 10 MG tablet TAKE 1 TABLET(10 MG) BY MOUTH DAILY (Patient taking differently: Take 10 mg by mouth at bedtime.)   NEEDLE, DISP, 18 G 18G X 1" MISC Use as directed   pantoprazole (PROTONIX) 40 MG tablet Take 1 tablet (40 mg total) by mouth 2 (two) times daily before a meal.   pregabalin (LYRICA) 150 MG capsule Take 1 capsule (150 mg total) by mouth 2 (two) times daily.   primidone (MYSOLINE) 50 MG tablet TAKE 2 TABLETS(100 MG) BY MOUTH DAILY   rosuvastatin (CRESTOR) 20 MG tablet TAKE 1 TABLET BY MOUTH  DAILY   Semaglutide, 1 MG/DOSE, 4 MG/3ML SOPN Inject 1 mg into the skin once a week.   SYRINGE-NEEDLE, DISP, 3 ML (LUER LOCK SAFETY SYRINGES) 21G X 1-1/2" 3 ML MISC Use as directed for testosterone administration   testosterone cypionate (DEPOTESTOSTERONE CYPIONATE) 200 MG/ML injection Inject 1.5 mLs (300 mg total) into the muscle every 14 (fourteen) days.   tiotropium (SPIRIVA) 18 MCG inhalation capsule Place into inhaler and inhale.   tiZANidine (ZANAFLEX) 4 MG tablet Take 1 tablet (4 mg total) by mouth every 8 (eight) hours as needed for muscle spasms.   triamcinolone (KENALOG) 0.025 % ointment Apply 1 application topically 2 (two) times daily.   WIXELA INHUB 250-50 MCG/DOSE AEPB Inhale 1 puff into the lungs 2 (two) times daily.   No facility-administered encounter medications on file as of 09/15/2020.    Patient Active Problem List   Diagnosis Date Noted   Sinus congestion 03/04/2020   Chronic bilateral low back pain with bilateral sciatica 12/06/2018   Lumbar degenerative disc disease 12/06/2018   Lumbar facet arthropathy 12/06/2018   Sacroiliac joint pain 12/06/2018   Chronic  pain syndrome 12/06/2018   Bilateral primary osteoarthritis of knee 12/06/2018   GERD (gastroesophageal reflux disease) 11/12/2018   Coronary artery disease of native artery of native heart with stable angina pectoris ([place]) 08/16/2018   Diabetic peripheral neuropathy associated with type 2 diabetes mellitus ([place]) 04/30/2018   Former smoker 04/30/2018   Obesity (BMI 30-39.9) 04/30/2018   Type 2 diabetes mellitus with hyperglycemia, with long-term current use of insulin ([place]) 04/30/2018   Hyperlipidemia associated with type 2 diabetes mellitus ([place]) 04/21/2017   Asthma 04/21/2017   RLS (restless legs syndrome) 04/21/2017   Allergic rhinitis 04/21/2017   Hypertension associated with type 2 diabetes mellitus ([place]) 04/21/2017   Major depression 04/21/2017   Tremor 04/21/2017   Hypogonadism male 04/21/2017    Conditions to be addressed/monitored:CAD, HTN, HLD, DMII, and Depression  Care Plan : RNCM; Coronary Artery Disease (Adult)  and HLD  Updates made by Vanita Ingles since 09/15/2020 12:00 AM     Problem: RNCM: Disease Progression (Coronary Artery Disease) and HLD   Priority: High     Long-Range Goal: RNCM: HLD and CAD   Start Date: 07/21/2020  Expected End Date: 07/21/2021  This Visit's Progress: On track  Priority: High  Note:   Current Barriers:  Poorly controlled hyperlipidemia, complicated by stress, HTN, DM uncontrolled, COPD Current antihyperlipidemic regimen: Tricor 145 mg QD and Crestor 20 mg QD Most recent lipid panel:     Component Value Date/Time   CHOL 159 07/29/2020 0931   CHOL 157 01/29/2018 1038   TRIG 398 (H) 07/29/2020 0931   TRIG 292 (H) 01/29/2018 1038   HDL 32 (L) 07/29/2020 0931   CHOLHDL 5.0 07/29/2020 0931   VLDL 58 (H) 01/29/2018 1038   [place] 65 07/29/2020 0931   ASCVD risk enhancing conditions: age 4, DM, HTN, former smoker Unable to independently manage CAD and HLD Lacks social connections Does not contact provider office for  questions/concerns RN Care Manager Clinical Goal(s):  patient will work with Consulting civil engineer, providers, and care team towards execution of optimized self-health management plan patient will verbalize understanding of plan for effective management of CAD and HLD patient will work with RNCM and pcp  to address needs related to CAD and HLD patient will take all medications exactly as prescribed and will call provider for medication related questions patient will attend all scheduled medical appointments: no upcoming appointments with the pcp, knows to call for changes, several appointments with specialist patient will demonstrate improved adherence to prescribed treatment plan for CAD and HLD the patient will demonstrate ongoing self health care management ability Interventions: Collaboration with Jon Billings, NP regarding development and update of comprehensive plan of care as evidenced by provider attestation and co-signature Inter-disciplinary care team collaboration (see longitudinal plan of care) Medication review performed; medication list updated in electronic medical record.  Inter-disciplinary care team collaboration (see longitudinal plan of care) Referred to pharmacy team for assistance with HLD medication management Evaluation of current treatment plan related to CAD and HLD and patient's adherence to plan as established by provider. 09-15-2020: The patient states he is eating better. The patient denies any issues with cardiac health. Is stressed out about his wife. Will continue to monitor.  Advised patient to follow a heart healthy/ADA diet, be mindful of stress levels and other factors that impact health and well being, medication compliance and calling the office for changes in condition, or questions.  Provided education to patient re: heart healthy/ADA diet and monitoring fats in diet. The patient likes fresh fruits and vegetables and states he is trying to watch what he eats  better. Also education on exercise and keeping stress levels down.  Reviewed medications with patient and discussed compliance. The patient endorses compliance with medication regimen  Reviewed scheduled/upcoming provider appointments including: No upcoming with the pcp, knows to call for changes Discussed plans with patient for ongoing care management follow up and provided patient with direct contact information for care management team Patient Goals/Self-Care Activities: - call for medicine refill 2 or 3 days before it runs out - call if I am sick and can't take my medicine - keep a list of all the medicines I take; vitamins and herbals too - learn to read medicine labels - use a pillbox to sort medicine - change to whole grain breads, cereal, pasta - drink 6 to 8 glasses of water  each day - eat 3 to 5 servings of fruits and vegetables each day - eat 5 or 6 small meals each day - eat fish at least once per week - fill half the plate with nonstarchy vegetables - limit fast food meals to no more than 1 per week - manage portion size - prepare main meal at home 3 to 5 days each week - read food labels for fat, fiber, carbohydrates and portion size - be open to making changes - I can manage, know and watch for signs of a heart attack - if I have chest pain, call for help - learn about small changes that will make a big difference - learn my personal risk factors - barriers to treatment adherence reviewed and addressed - difficulty of making life-long changes acknowledged - functional limitation screening reviewed - healthy lifestyle promoted - individualized medical nutrition therapy provided - medication-adherence assessment completed - medication side effects managed - rescue (action) plan developed - self-awareness of signs/symptoms of worsening disease encouraged Follow Up Plan: Telephone follow up appointment with care management team member scheduled for: 11-10-2020 at 09:45  am      Task: RNCM: Alleviate Barriers to Coronary Artery Disease Therapy and HLD Completed 09/15/2020  Outcome: Positive  Note:   Care Management Activities:    - barriers to treatment adherence reviewed and addressed - difficulty of making life-long changes acknowledged - functional limitation screening reviewed - healthy lifestyle promoted - individualized medical nutrition therapy provided - medication-adherence assessment completed - medication side effects managed - rescue (action) plan developed - self-awareness of signs/symptoms of worsening disease encouraged        Care Plan : RNCM: Hypertension (Adult)  Updates made by Vanita Ingles since 09/15/2020 12:00 AM     Problem: RNCM: Hypertension (Hypertension)   Priority: Medium     Long-Range Goal: RNCM: Hypertension Monitored   Start Date: 07/21/2020  Expected End Date: 07/21/2021  This Visit's Progress: On track  Priority: Medium  Note:   Objective:  Last practice recorded BP readings:  BP Readings from Last 3 Encounters:  09/08/20 122/74  08/20/20 (!) 142/80  07/29/20 (!) 102/49   Most recent eGFR/CrCl:  Lab Results  Component Value Date   EGFR 77 04/28/2020    No components found for: CRCL Current Barriers:  Knowledge Deficits related to basic understanding of hypertension pathophysiology and self care management Knowledge Deficits related to understanding of medications prescribed for management of hypertension Limited Social Support Unable to independently manage HTN  Lacks social connections Does not contact provider office for questions/concerns Case Manager Clinical Goal(s):  patient will verbalize understanding of plan for hypertension management patient will attend all scheduled medical appointments: no upcoming with the pcp, knows to call for changes, has several coming up with specialist.  patient will demonstrate improved adherence to prescribed treatment plan for hypertension as evidenced by  taking all medications as prescribed, monitoring and recording blood pressure as directed, adhering to low sodium/DASH diet patient will demonstrate improved health management independence as evidenced by checking blood pressure as directed and notifying PCP if SBP>150 or DBP > 90, taking all medications as prescribe, and adhering to a low sodium diet as discussed. patient will verbalize basic understanding of hypertension disease process and self health management plan as evidenced by compliance with heart healthy/ADA diet, compliance with medications and working with the CCM team to manage health and well being.  Interventions:  Collaboration with Jon Billings, NP regarding development and update of  comprehensive plan of care as evidenced by provider attestation and co-signature Inter-disciplinary care team collaboration (see longitudinal plan of care) Evaluation of current treatment plan related to hypertension self management and patient's adherence to plan as established by provider. 09-15-2020: The patient has been running some elevated blood pressures recently. The patient states he has been under a lot of stress concerned about his wife Horris Latino who has cancer. She is currently in the hospital in [facility]. Empathetic listening and support. Education provided on taking care of self and monitoring for changes so he could be there for [place] when she gets home. The patient verbalized understanding.  Provided education to patient re: stroke prevention, s/s of heart attack and stroke, DASH diet, complications of uncontrolled blood pressure Reviewed medications with patient and discussed importance of compliance. 09-15-2020: Endorses compliance with medications.  Discussed plans with patient for ongoing care management follow up and provided patient with direct contact information for care management team Advised patient, providing education and rationale, to monitor blood pressure daily and record,  calling PCP for findings outside established parameters.  Reviewed scheduled/upcoming provider appointments including: No upcoming appointments with the pcp, knows to call for changes.  Self-Care Activities: - Self administers medications as prescribed Attends all scheduled provider appointments Calls provider office for new concerns, questions, or BP outside discussed parameters Checks BP and records as discussed Follows a low sodium diet/DASH diet Patient Goals: - check blood pressure weekly - choose a place to take my blood pressure (home, clinic or office, retail store) - write blood pressure results in a log or diary - agree on reward when goals are met - agree to work together to make changes - ask questions to understand - have a family meeting to talk about healthy habits - learn about high blood pressure - blood pressure trends reviewed - depression screen reviewed - home or ambulatory blood pressure monitoring encouraged Follow Up Plan: Telephone follow up appointment with care management team member scheduled for: 11-10-2020 at 0945 am    Task: [facility]; Identify and Monitor Blood Pressure Elevation Completed 09/15/2020  Outcome: Positive  Note:   Care Management Activities:    - blood pressure trends reviewed - depression screen reviewed - home or ambulatory blood pressure monitoring encouraged        Care Plan : RNCM: Diabetes Type 2 (Adult)  Updates made by Vanita Ingles since 09/15/2020 12:00 AM     Problem: RNCM: Glycemic Management (Diabetes, Type 2)   Priority: High     Long-Range Goal: RNCM: Glycemic Management Optimized   Start Date: 07/21/2020  Expected End Date: 07/21/2021  This Visit's Progress: On track  Priority: High  Note:   Objective:  Lab Results  Component Value Date   HGBA1C 9.7 (H) 07/29/2020  8.9% at Endocrinologist on 08-31-2020 Lab Results  Component Value Date   CREATININE 1.40 (H) 07/29/2020   CREATININE 1.09 04/28/2020    CREATININE 1.37 (H) 02/24/2020   Lab Results  Component Value Date   EGFR 56 (L) 07/29/2020   Current Barriers:  Knowledge Deficits related to basic Diabetes pathophysiology and self care/management Knowledge Deficits related to medications used for management of diabetes Limited Social Support Unable to independently manage DM as evidence of hemoglobin A1C of 10.0, 09-15-2020: Newest A1C 8.9% on 08-31-2020 Does not adhere to provider recommendations re: following a heart healthy/ADA diet Lacks social connections Does not contact provider office for questions/concerns Case Manager Clinical Goal(s):  patient will demonstrate improved adherence to prescribed  treatment plan for diabetes self care/management as evidenced by: daily monitoring and recording of CBG  adherence to ADA/ carb modified diet exercise 4/5 days/week adherence to prescribed medication regimen contacting provider for new or worsened symptoms or questions Interventions:  Collaboration with Jon Billings, NP regarding development and update of comprehensive plan of care as evidenced by provider attestation and co-signature Inter-disciplinary care team collaboration (see longitudinal plan of care) Provided education to patient about basic DM disease process Reviewed medications with patient and discussed importance of medication adherence. 09-15-2020: Endorses compliance with medications Discussed plans with patient for ongoing care management follow up and provided patient with direct contact information for care management team Provided patient with written educational materials related to hypo and hyperglycemia and importance of correct treatment Reviewed scheduled/upcoming provider appointments including: Sees endocrinologist regularly, last appointment on 08-31-2020. No upcoming with the pcp.  Advised patient, providing education and rationale, to check cbg BID and record, calling pcp for findings outside established  parameters.  09-15-2020: The patient states his fasting blood sugars have been around 150-160. Denies any lows since last month. Had not been eating well due to stress of his wife's illness but he is doing better now. Education on fasting blood sugar <130 and post prandial of <180.  The patient is checking Bid and recording. Encouraged heart healthy/ADA diet. Will continue to monitor and educate accordingly.  Review of patient status, including review of consultants reports, relevant laboratory and other test results, and medications completed. Self-Care Activities - UNABLE to independently manage DM Attends all scheduled provider appointments Checks blood sugars as prescribed and utilize hyper and hypoglycemia protocol as needed Adheres to prescribed ADA/carb modified Patient Goals: - check blood sugar at prescribed times - check blood sugar before and after exercise - check blood sugar if I feel it is too high or too low - enter blood sugar readings and medication or insulin into daily log - take the blood sugar log to all doctor visits - change to whole grain breads, cereal, pasta - set goal weight - drink 6 to 8 glasses of water each day - eat fish at least once per week - fill half of plate with vegetables - limit fast food meals to no more than 1 per week - manage portion size - prepare main meal at home 3 to 5 days each week - read food labels for fat, fiber, carbohydrates and portion size - schedule appointment with eye doctor - check feet daily for cuts, sores or redness - keep feet up while sitting - trim toenails straight across - wash and dry feet carefully every day - wear comfortable, cotton socks - wear comfortable, well-fitting shoes Last foot exam on 09-10-2020 with trimming of nails - barriers to adherence to treatment plan identified - blood glucose monitoring encouraged - blood glucose readings reviewed - individualized medical nutrition therapy provided - mutual  A1C goal set or reviewed - resources required to improve adherence to care identified - self-awareness of signs/symptoms of hypo or hyperglycemia encouraged - use of blood glucose monitoring log promoted Follow Up Plan: Telephone follow up appointment with care management team member scheduled for: 11-10-2020 at 0945 am          Task: RNCM: Alleviate Barriers to Glycemic Management Completed 09/15/2020  Outcome: Positive  Note:   Care Management Activities:    - barriers to adherence to treatment plan identified - blood glucose monitoring encouraged - blood glucose readings reviewed - individualized medical nutrition therapy provided -  mutual A1C goal set or reviewed - resources required to improve adherence to care identified - self-awareness of signs/symptoms of hypo or hyperglycemia encouraged - use of blood glucose monitoring log promoted        Care Plan : RNCM: Depression (Adult) and grief  Updates made by Vanita Ingles since 09/15/2020 12:00 AM     Problem: RNCM: Depression Identification (Depression)   Priority: High     Long-Range Goal: RNCM: Depressive Symptoms Identified and Grief   Start Date: 07/21/2020  Expected End Date: 07/21/2021  This Visit's Progress: On track  Priority: High  Note:   Current Barriers:  Ineffective Self Health Maintenance  Unable to independently manage depression and grief  Lacks social connections Does not contact provider office for questions/concerns Recent death of his mother on 07-23-2020 and his wife with lung cancer actively taking chemotherapy- treatments x 2 months now.  Clinical Goal(s):  Collaboration with Jon Billings, NP regarding development and update of comprehensive plan of care as evidenced by provider attestation and co-signature Inter-disciplinary care team collaboration (see longitudinal plan of care) patient will work with care management team to address care coordination and chronic disease management needs  related to Disease Management Educational Needs Care Coordination Mental Health Counseling Caregiver Stress support Other grief due to the recent loss of his mother on 07-23-20    Interventions:  Evaluation of current treatment plan related to Depression and grief  , Limited social support, Mental Health Concerns , and recent death of his mother on 07-23-2020 and his wife actively taking chemotherapy for lung cancer self-management and patient's adherence to plan as established by provider. The patient has been under a lot of stress recently. His mother passed away last week (07/23/2020) and also his wife is actively taking chemo for treatment of lung cancer. The patient states that he is managing okay. He denies any acute distress but knows this is impacting his overall health and well being. Empathetic listening and support given. Will touch base with the LCSW for assistance with depression and grief process for added support and recommendations. Will continue to monitor. 09-15-2020: The patient is still experiencing a lot of stress. Currently his wife is in the hospital in [place]. He states the cancer has moved to her spine. He is concerned about her. Empathetic listening and support. The patient states he is doing better with his conditions.  Collaboration with Jon Billings, NP regarding development and update of comprehensive plan of care as evidenced by provider attestation       and co-signature Inter-disciplinary care team collaboration (see longitudinal plan of care) Discussed plans with patient for ongoing care management follow up and provided patient with direct contact information for care management team Self Care Activities:  Patient verbalizes understanding of plan to manage depression and grief process  Self administers medications as prescribed Attends all scheduled provider appointments Calls pharmacy for medication refills Performs ADL's independently Performs IADL's  independently Calls provider office for new concerns or questions Patient Goals: - anxiety screen reviewed - depression screen reviewed - medication list reviewed Follow Up Plan: Telephone follow up appointment with care management team member scheduled for: 11-10-2020 at 0945 am    Task: RNCM: Identify Depressive Symptoms and Facilitate Treatment Completed 09/15/2020  Outcome: Positive  Note:   Care Management Activities:    - anxiety screen reviewed - depression screen reviewed - medication list reviewed         Plan:Telephone follow up appointment with care management team  member scheduled for:  11-10-2020 at [place] am  Noreene Larsson RN, MSN, [facility] Mobile: [phone]

## 2020-09-19 ENCOUNTER — Other Ambulatory Visit: Payer: Self-pay | Admitting: Nurse Practitioner

## 2020-09-23 ENCOUNTER — Other Ambulatory Visit: Payer: Self-pay | Admitting: Nurse Practitioner

## 2020-09-23 DIAGNOSIS — E1141 Type 2 diabetes mellitus with diabetic mononeuropathy: Secondary | ICD-10-CM

## 2020-10-14 ENCOUNTER — Ambulatory Visit: Payer: Medicare Other | Admitting: Dermatology

## 2020-10-21 ENCOUNTER — Ambulatory Visit (INDEPENDENT_AMBULATORY_CARE_PROVIDER_SITE_OTHER): Payer: Medicare Other | Admitting: Dermatology

## 2020-10-21 ENCOUNTER — Ambulatory Visit: Payer: Medicare Other | Admitting: Dermatology

## 2020-10-21 ENCOUNTER — Other Ambulatory Visit: Payer: Self-pay

## 2020-10-21 DIAGNOSIS — L820 Inflamed seborrheic keratosis: Secondary | ICD-10-CM | POA: Diagnosis not present

## 2020-10-21 DIAGNOSIS — K130 Diseases of lips: Secondary | ICD-10-CM

## 2020-10-21 DIAGNOSIS — L578 Other skin changes due to chronic exposure to nonionizing radiation: Secondary | ICD-10-CM

## 2020-10-21 MED ORDER — KETOCONAZOLE 2 % EX CREA
TOPICAL_CREAM | CUTANEOUS | 2 refills | Status: AC
Start: 2020-10-21 — End: ?

## 2020-10-21 NOTE — Patient Instructions (Addendum)

## 2020-10-21 NOTE — Progress Notes (Signed)
   New Patient Visit  Subjective  Corey [name] is a 65 y.o. male who presents for the following: Skin Problem (New pt c/o skin  breakout on his face treating with Triamcinolone 0.025% ointment with a poor response ).  The following portions of the chart were reviewed this encounter and updated as appropriate:   Tobacco  Allergies  Meds  Problems  Med Hx  Surg Hx  Fam Hx     Review of Systems:  No other skin or systemic complaints except as noted in HPI or Assessment and Plan.  Objective  Well appearing patient in no apparent distress; mood and affect are within normal limits.  A focused examination was performed including face. Relevant physical exam findings are noted in the Assessment and Plan.  face   (16) (16) Erythematous keratotic or waxy stuck-on papule or plaque.   corners of mouth Dry crusty skin   Assessment & Plan  Inflamed seborrheic keratosis face   (16)  Reassured benign age-related growth.  Recommend observation.  Discussed cryotherapy if spot(s) become irritated or inflamed.   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  - Recommend staying in the shade or wearing long sleeves, sun glasses (UVA+UVB protection) and wide brim hats (4-inch brim around the entire circumference of the hat). - Call for new or changing lesions.  Return in about 4 months (around 02/20/2021) for ISK, Cheilitis .  IAngelique Holm, CMA, am acting as scribe for Armida Sans, MD .  Documentation: I have reviewed the above documentation for accuracy and completeness, and I agree with the above.  Armida Sans, MD

## 2020-10-22 ENCOUNTER — Telehealth: Payer: Self-pay

## 2020-10-22 NOTE — Chronic Care Management (AMB) (Signed)
Chronic Care Management Pharmacy Assistant   Name: Corey [name]  MRN: [id number] DOB: 21-Oct-1955  Reason for Encounter: Disease State General  Recent office visits:  07/29/20-Corey Mathis Dad, NP (PCP) General follow up visit. Labs ordered. Follow up in 6 months. 07/28/20-Corey P. Jonhson, DO. Seen for diabetic follow up and therapeutic shoes. Return as scheduled. 04/28/20-Corey Mathis Dad, NP (PCP) Seen for a rash. Labs ordered. Start on triamcinolone (KENALOG) 0.025 % ointment. Follow up in 3 months.  Recent consult visits:  10/21/20-Corey Isac Caddy, MD (Dermatology) Seen for a skin problem. Start on ketoconazole (NIZORAL) 2 % cream. Follow up in 4 months. 10/14/20-Corey [name] (Cardiology) Seen for a follow up. Follow up in 1 month. 09/10/20-Corey [name], DPM (Podiatry) Seen for a follow up. Follow up in 3 months. 09/08/20-Corey Holley Raring, MD (Pain medicine) Seen for medication management. Follow up in 6 moths. 08/31/20-Corey [name] (Endocrinology) Seen for diabetic followup visit. Will send prescription for a FreeStyle Libre Flash Glucose Monitor. Follow up in 3 months. 08/20/20-Corey C. Bernardo Heater, MD (Urology) Seen for Hypogonadism follow up. Follow up in 1 year. 06/25/20-Corey E. Raul Del, MD (Pulmonology) Follow up visit. Add spiriva one puff q day. Follow up in 4 months. 06/09/20-Corey [name], DPM (Podiatry) Seen for nail problem. A steroid injection was performed at bilateral ankle joint using 1% plain Lidocaine and 10 mg of Kenalog. Follow up in 3 months. 04/30/20-Corey [name] (Cardiology) Follow up visit. Follow up in 6 months.  Hospital visits:  None in previous 6 months  Medications: Outpatient Encounter Medications as of 10/22/2020  Medication Sig   Accu-Chek FastClix Lancets MISC USE TO CHECK BLOOD SUGAR AS DIRECTED   acetaminophen (TYLENOL) 500 MG tablet Take 1,000 mg by mouth every 6 (six) hours as needed for moderate pain or headache.    albuterol (PROVENTIL) (2.5 MG/3ML) 0.083% nebulizer solution USE 1 VIAL IN NEBULIZER EVERY 6 HOURS - and as needed.   albuterol (VENTOLIN HFA) 108 (90 Base) MCG/ACT inhaler INHALE 2 PUFFS INTO THE LUNGS EVERY 6 HOURS AS NEEDED FOR WHEEZING OR SHORTNESS OF BREATH (Patient taking differently: Inhale 2 puffs into the lungs every 6 (six) hours as needed for wheezing or shortness of breath.)   amitriptyline (ELAVIL) 50 MG tablet TAKE 1 TO 2 TABLETS(50 TO 100 MG) BY MOUTH AT BEDTIME   aspirin EC 81 MG tablet Take 81 mg by mouth daily.   azelastine (ASTELIN) 0.1 % nasal spray USE 2 SPRAYS IN EACH NOSTRIL TWICE DAILY (Patient taking differently: Place 2 sprays into both nostrils 2 (two) times daily.)   B-D UF III MINI PEN NEEDLES 31G X 5 MM MISC USE TWICE DAILY   Blood Glucose Monitoring Suppl (ONETOUCH VERIO) w/Device KIT Use to check blood sugar 2-3 times daily and document for provider visits.   buPROPion (WELLBUTRIN XL) 150 MG 24 hr tablet TAKE 1 TABLET(150 MG) BY MOUTH DAILY   cetirizine (ZYRTEC) 10 MG tablet TAKE 1 TABLET BY MOUTH DAILY   Cholecalciferol (VITAMIN D3) 5000 units TABS Take 5,000 Units by mouth daily.    ciclopirox (PENLAC) 8 % solution Apply topically at bedtime. Apply over nail and surrounding skin. Apply daily over previous coat. After seven (7) days, may remove with alcohol and continue cycle.   citalopram (CELEXA) 10 MG tablet TAKE 1 TABLET BY MOUTH  DAILY   clopidogrel (PLAVIX) 75 MG tablet Take 75 mg by mouth daily.   diclofenac sodium (VOLTAREN) 1 % GEL Apply 1 application topically 4 (four) times  daily as needed (pain).    famotidine (PEPCID) 40 MG tablet TAKE 1 TABLET(40 MG) BY MOUTH DAILY   fenofibrate (TRICOR) 145 MG tablet TAKE 1 TABLET BY MOUTH  DAILY   furosemide (LASIX) 20 MG tablet TAKE 1 TABLET(20 MG) BY MOUTH TWICE DAILY   glucose blood test strip Use to check blood sugar 2-3 times daily and document for provider visits.   isosorbide mononitrate (IMDUR) 60 MG 24 hr  tablet Take 60 mg by mouth daily.   JARDIANCE 25 MG TABS tablet Take 1 tablet (25 mg total) by mouth daily.   ketoconazole (NIZORAL) 2 % cream Apply to corners of mouth at bedtime   ketorolac (TORADOL) 10 MG tablet Take 1 tablet (10 mg total) by mouth every 6 (six) hours as needed.   Lancets Misc. (ACCU-CHEK FASTCLIX LANCET) KIT 1 Units by Does not apply route 2 (two) times daily.   LANTUS SOLOSTAR 100 UNIT/ML Solostar Pen Inject 85 Units into the skin at bedtime.   losartan (COZAAR) 50 MG tablet Take 1 tablet (50 mg total) by mouth daily.   magnesium gluconate (MAGONATE) 500 MG tablet Take 500 mg by mouth daily.   meloxicam (MOBIC) 7.5 MG tablet TAKE 1 TABLET(7.5 MG) BY MOUTH DAILY   metFORMIN (GLUCOPHAGE) 1000 MG tablet TAKE 1 TABLET BY MOUTH  TWICE DAILY   metoprolol succinate (TOPROL-XL) 25 MG 24 hr tablet Take 25 mg by mouth daily.   montelukast (SINGULAIR) 10 MG tablet TAKE 1 TABLET(10 MG) BY MOUTH DAILY (Patient taking differently: Take 10 mg by mouth at bedtime.)   NEEDLE, DISP, 18 G 18G X 1" MISC Use as directed   pantoprazole (PROTONIX) 40 MG tablet Take 1 tablet (40 mg total) by mouth 2 (two) times daily before a meal.   pregabalin (LYRICA) 150 MG capsule Take 1 capsule (150 mg total) by mouth 2 (two) times daily.   primidone (MYSOLINE) 50 MG tablet TAKE 2 TABLETS(100 MG) BY MOUTH DAILY   rosuvastatin (CRESTOR) 20 MG tablet TAKE 1 TABLET BY MOUTH  DAILY   Semaglutide, 1 MG/DOSE, 4 MG/3ML SOPN Inject 1 mg into the skin once a week.   SYRINGE-NEEDLE, DISP, 3 ML (LUER LOCK SAFETY SYRINGES) 21G X 1-1/2" 3 ML MISC Use as directed for testosterone administration   testosterone cypionate (DEPOTESTOSTERONE CYPIONATE) 200 MG/ML injection Inject 1.5 mLs (300 mg total) into the muscle every 14 (fourteen) days.   tiotropium (SPIRIVA) 18 MCG inhalation capsule Place into inhaler and inhale.   tiZANidine (ZANAFLEX) 4 MG tablet Take 1 tablet (4 mg total) by mouth every 8 (eight) hours as needed for  muscle spasms.   triamcinolone (KENALOG) 0.025 % ointment Apply 1 application topically 2 (two) times daily.   WIXELA INHUB 250-50 MCG/DOSE AEPB Inhale 1 puff into the lungs 2 (two) times daily.   No facility-administered encounter medications on file as of 10/22/2020.   Have you had any problems recently with your health? Patient states he has no problems with his health at this time.  Have you had any problems with your pharmacy? Patient states he has no problems with his pharmacy.  What issues or side effects are you having with your medications? Patient states he has no issues or side effects with his medications.  What would you like me to pass along to [facility] Nasuti Potts,CPP for them to help you with?  Patient states there is nothing at this time.  What can we do to take care of you better? Patient states there is  nothing at this time.  Care Gaps: COVID-19 Vaccine:never done INFLUENZA VACCINE:Overdue since 08/31/2020  Star Rating Drugs: Rosuvastatin 20 mg Last filled:08/25/20 90 DS Jardiance 25 mg Last filled:09/12/20 90 DS Losartan 50 mg Last filled:09/08/20 90 DS  Schduled appointment with CPP on 11/23/20 @10am   Myriam Elta [place], [place]

## 2020-10-23 ENCOUNTER — Encounter: Payer: Self-pay | Admitting: Dermatology

## 2020-10-26 ENCOUNTER — Other Ambulatory Visit: Payer: Self-pay | Admitting: Nurse Practitioner

## 2020-10-26 DIAGNOSIS — E1165 Type 2 diabetes mellitus with hyperglycemia: Secondary | ICD-10-CM

## 2020-10-26 NOTE — Telephone Encounter (Signed)
Future in 3 days

## 2020-10-29 ENCOUNTER — Other Ambulatory Visit: Payer: Self-pay

## 2020-10-29 ENCOUNTER — Encounter: Payer: Self-pay | Admitting: Nurse Practitioner

## 2020-10-29 ENCOUNTER — Ambulatory Visit (INDEPENDENT_AMBULATORY_CARE_PROVIDER_SITE_OTHER): Payer: Medicare Other | Admitting: Nurse Practitioner

## 2020-10-29 VITALS — BP 134/82 | HR 85 | Ht 68.0 in | Wt 220.8 lb

## 2020-10-29 DIAGNOSIS — E1159 Type 2 diabetes mellitus with other circulatory complications: Secondary | ICD-10-CM

## 2020-10-29 DIAGNOSIS — E1165 Type 2 diabetes mellitus with hyperglycemia: Secondary | ICD-10-CM | POA: Diagnosis not present

## 2020-10-29 DIAGNOSIS — E1169 Type 2 diabetes mellitus with other specified complication: Secondary | ICD-10-CM

## 2020-10-29 DIAGNOSIS — I25118 Atherosclerotic heart disease of native coronary artery with other forms of angina pectoris: Secondary | ICD-10-CM | POA: Diagnosis not present

## 2020-10-29 DIAGNOSIS — E782 Mixed hyperlipidemia: Secondary | ICD-10-CM

## 2020-10-29 DIAGNOSIS — Z23 Encounter for immunization: Secondary | ICD-10-CM

## 2020-10-29 DIAGNOSIS — I152 Hypertension secondary to endocrine disorders: Secondary | ICD-10-CM

## 2020-10-29 DIAGNOSIS — F3341 Major depressive disorder, recurrent, in partial remission: Secondary | ICD-10-CM

## 2020-10-29 DIAGNOSIS — Z794 Long term (current) use of insulin: Secondary | ICD-10-CM

## 2020-10-29 DIAGNOSIS — E785 Hyperlipidemia, unspecified: Secondary | ICD-10-CM

## 2020-10-29 MED ORDER — TRIAMCINOLONE ACETONIDE 0.025 % EX OINT: 1.0000 "application " | TOPICAL_OINTMENT | Freq: Two times a day (BID) | CUTANEOUS | 1 refills | Status: AC

## 2020-10-29 NOTE — Assessment & Plan Note (Signed)
Chronic.  Stable.  Continue to follow up with Cardiology.  Follow up in 3 months.  Call sooner if concerns arise.

## 2020-10-29 NOTE — Assessment & Plan Note (Signed)
Chronic.  Controlled.  Continue with current medication regimen of Imdur 60mg  daily, Metoprolol 25mg  daily, and Losartan 25mg .  Labs ordered today.  States he will start checking blood pressures at home. Return to clinic in 3 months for reevaluation.  Call sooner if concerns arise.

## 2020-10-29 NOTE — Progress Notes (Signed)
BP 134/82   Pulse 85   Ht 5' 8"  (1.727 m)   Wt 220 lb 12.8 oz (100.2 kg)   SpO2 96%   BMI 33.57 kg/m    Subjective:    Patient ID: Corey [name], male    DOB: August 07, 1955, 65 y.o.   MRN: [id number]  HPI: Corey [name] is a 65 y.o. male  Chief Complaint  Patient presents with   Diabetes   Hypertension   HYPERTENSION / HYPERLIPIDEMIA Satisfied with current treatment? yes Duration of hypertension: chronic BP monitoring frequency: not checking- will start checking BP range:  BP medication side effects: no Past BP meds: Losartan 76m, Imdur 612mand Metoprolol 2532mDuration of hyperlipidemia: chronic Cholesterol medication side effects: yes Cholesterol supplements: None. Past cholesterol medications: Crestor 68m13medication compliance: excellent compliance Aspirin: yes Recent stressors: no Recurrent headaches: no Visual changes: no Palpitations: no Dyspnea: no Chest pain: no Lower extremity edema: yes- patient states it usually goes down but recently it has been persistent. Dizzy/lightheaded: no   DIABETES Hypoglycemic episodes:yes 2-3 times this week Polydipsia/polyuria: no Visual disturbance: no Chest pain: no Paresthesias: no Glucose Monitoring: yes             Accucheck frequency: Daily             Fasting glucose: 100s             Post prandial:  Taking Insulin?: yes             Long acting insulin: Lantus 85 U daily Blood Pressure Monitoring: not checking Retinal Examination: Not up to Date Foot Exam: Up to Date   Patient states the Cardiologist is sending him to a vein specialist since he is having swelling in his legs and increased pain from his neuropathy.   Relevant past medical, surgical, family and social history reviewed and updated as indicated. Interim medical history since our last visit reviewed. Allergies and medications reviewed and updated.  Review of Systems  Eyes:  Negative for visual disturbance.  Respiratory:  Negative for  chest tightness and shortness of breath.   Cardiovascular:  Positive for leg swelling. Negative for chest pain and palpitations.  Endocrine: Negative for polydipsia and polyuria.  Musculoskeletal:        Leg pain  Neurological:  Negative for dizziness, light-headedness, numbness and headaches.   Per HPI unless specifically indicated above     Objective:    BP 134/82   Pulse 85   Ht 5' 8"  (1.727 m)   Wt 220 lb 12.8 oz (100.2 kg)   SpO2 96%   BMI 33.57 kg/m   Wt Readings from Last 3 Encounters:  10/29/20 220 lb 12.8 oz (100.2 kg)  09/08/20 220 lb (99.8 kg)  08/20/20 212 lb (96.2 kg)    Physical Exam Vitals and nursing note reviewed.  Constitutional:      General: He is not in acute distress.    Appearance: Normal appearance. He is not ill-appearing, toxic-appearing or diaphoretic.  HENT:     Head: Normocephalic.     Right Ear: External ear normal.     Left Ear: External ear normal.     Nose: Nose normal. No congestion or rhinorrhea.     Mouth/Throat:     Mouth: Mucous membranes are moist.  Eyes:     General:        Right eye: No discharge.        Left eye: No discharge.     Extraocular Movements:  Extraocular movements intact.     Conjunctiva/sclera: Conjunctivae normal.     Pupils: Pupils are equal, round, and reactive to light.  Cardiovascular:     Rate and Rhythm: Normal rate and regular rhythm.     Heart sounds: No murmur heard. Pulmonary:     Effort: Pulmonary effort is normal. No respiratory distress.     Breath sounds: Normal breath sounds. No wheezing, rhonchi or rales.  Abdominal:     General: Abdomen is flat. Bowel sounds are normal.  Musculoskeletal:     Cervical back: Normal range of motion and neck supple.     Right lower leg: 1+ Pitting Edema present.     Left lower leg: 1+ Pitting Edema present.  Skin:    General: Skin is warm and dry.     Capillary Refill: Capillary refill takes less than 2 seconds.     Findings: Rash: red, dry, flaky macular  rash on bilateral cheeks..  Neurological:     General: No focal deficit present.     Mental Status: He is alert and oriented to person, place, and time.  Psychiatric:        Mood and Affect: Mood normal.        Behavior: Behavior normal.        Thought Content: Thought content normal.        Judgment: Judgment normal.    Results for orders placed or performed in visit on 08/12/20  Hematocrit  Result Value Ref Range   Hematocrit 46.8 37.5 - 51.0 %  PSA  Result Value Ref Range   Prostate Specific Ag, Serum 0.2 0.0 - 4.0 ng/mL  Testosterone  Result Value Ref Range   Testosterone 74 (L) 264 - 916 ng/dL      Assessment & Plan:   Problem List Items Addressed This Visit       Cardiovascular and Mediastinum   Hypertension associated with type 2 diabetes mellitus ([place]) - Primary    Chronic.  Controlled.  Continue with current medication regimen of Imdur 23m daily, Metoprolol 240mdaily, and Losartan 2524m Labs ordered today.  States he will start checking blood pressures at home. Return to clinic in 3 months for reevaluation.  Call sooner if concerns arise.        Relevant Orders   Comp Met (CMET)   Coronary artery disease of native artery of native heart with stable angina pectoris (HCC)    Chronic.  Stable.  Continue to follow up with Cardiology.  Follow up in 3 months.  Call sooner if concerns arise.         Endocrine   Hyperlipidemia associated with type 2 diabetes mellitus (HCC)    Chronic.  Controlled.  Continue with current medication regimen of Crestor 30m46md Plavix 75mg25mabs ordered today.  Return to clinic in 3 months for reevaluation.  Continue to follow up with Cardiology. Call sooner if concerns arise.        Relevant Orders   Lipid Profile   Type 2 diabetes mellitus with hyperglycemia, with long-term current use of insulin (HCC)    Chronic.  Patient is having some hypoglycemia.  If A1c is well controlled, will decrease Lantus to 70 U daily.  Labs ordered  today.  Will let patient know what change will be made to his medications after lab results return.  Return to clinic in 3 months for reevaluation.  Call sooner if concerns arise.        Relevant Orders  Comp Met (CMET)   HgB A1c     Other   Major depression    Recently lost his wife.  States he is doing okay, taking it day by day.  Will reevaluate at future visits.  Follow up in 3 months. Call sooner if concerns arise.       Other Visit Diagnoses     Mixed hyperlipidemia       Relevant Orders   Lipid Profile   Needs flu shot       Relevant Orders   Flu Vaccine QUAD 36+ mos IM (Fluarix, Fluzone & Afluria Quad PF (Completed)        Follow up plan: Return in about 3 months (around 01/28/2021) for HTN, HLD, DM2 FU.

## 2020-10-29 NOTE — Assessment & Plan Note (Signed)
Chronic.  Controlled.  Continue with current medication regimen of Crestor 20mg  and Plavix 75mg .  Labs ordered today.  Return to clinic in 3 months for reevaluation.  Continue to follow up with Cardiology. Call sooner if concerns arise.

## 2020-10-29 NOTE — Assessment & Plan Note (Signed)
Patient is going to see a vascular specialist. Will follow up after patient sees specialist.

## 2020-10-29 NOTE — Assessment & Plan Note (Signed)
Recently lost his wife.  States he is doing okay, taking it day by day.  Will reevaluate at future visits.  Follow up in 3 months. Call sooner if concerns arise.

## 2020-10-29 NOTE — Assessment & Plan Note (Signed)
Chronic.  Patient is having some hypoglycemia.  If A1c is well controlled, will decrease Lantus to 70 U daily.  Labs ordered today.  Will let patient know what change will be made to his medications after lab results return.  Return to clinic in 3 months for reevaluation.  Call sooner if concerns arise.

## 2020-10-30 ENCOUNTER — Telehealth: Payer: Self-pay | Admitting: Nurse Practitioner

## 2020-10-30 LAB — COMPREHENSIVE METABOLIC PANEL
ALT: 48 IU/L — ABNORMAL HIGH (ref 0–44)
AST: 40 IU/L (ref 0–40)
Albumin/Globulin Ratio: 2.2 (ref 1.2–2.2)
Albumin: 4.8 g/dL (ref 3.8–4.8)
Alkaline Phosphatase: 127 IU/L — ABNORMAL HIGH (ref 44–121)
BUN/Creatinine Ratio: 14 (ref 10–24)
BUN: 19 mg/dL (ref 8–27)
Bilirubin Total: 0.5 mg/dL (ref 0.0–1.2)
CO2: 23 mmol/L (ref 20–29)
Calcium: 10 mg/dL (ref 8.6–10.2)
Chloride: 101 mmol/L (ref 96–106)
Creatinine, Ser: 1.39 mg/dL — ABNORMAL HIGH (ref 0.76–1.27)
Globulin, Total: 2.2 g/dL (ref 1.5–4.5)
Glucose: 150 mg/dL — ABNORMAL HIGH (ref 70–99)
Potassium: 4.6 mmol/L (ref 3.5–5.2)
Sodium: 142 mmol/L (ref 134–144)
Total Protein: 7 g/dL (ref 6.0–8.5)
eGFR: 57 mL/min/{1.73_m2} — ABNORMAL LOW (ref >59)

## 2020-10-30 LAB — LIPID PANEL
Chol/HDL Ratio: 4.1 ratio (ref 0.0–5.0)
Cholesterol, Total: 156 mg/dL (ref 100–199)
HDL: 38 mg/dL — ABNORMAL LOW (ref >39)
LDL Chol Calc (NIH): 87 mg/dL (ref 0–99)
Triglycerides: 181 mg/dL — ABNORMAL HIGH (ref 0–149)
VLDL Cholesterol Cal: 31 mg/dL (ref 5–40)

## 2020-10-30 LAB — HEMOGLOBIN A1C
Est. average glucose Bld gHb Est-mCnc: 169 mg/dL
Hgb A1c MFr Bld: 7.5 % — ABNORMAL HIGH (ref 4.8–5.6)

## 2020-10-30 NOTE — Telephone Encounter (Signed)
Pt seen yesterday.  Needs to tell Clydie Braun he is taking 90 units of insulin, not 80 as she thought.

## 2020-10-30 NOTE — Telephone Encounter (Signed)
Patient made aware of results and verbalized understanding.  

## 2020-10-30 NOTE — Telephone Encounter (Signed)
Please let him know that it is okay to drop it to 70u still.  We will re check his a1c in 3 months.

## 2020-10-30 NOTE — Progress Notes (Signed)
Please let patient know that his lab work is stable.  Kidney function remains relatively the same.  Triglycerides have improved from prior which is great news.  A1c also improved from 9.7 to 7.5.  We should cut his Lantus back from 85u to 70u daily.  Please let me know if he has any questions.

## 2020-11-01 DIAGNOSIS — I890 Lymphedema, not elsewhere classified: Secondary | ICD-10-CM | POA: Insufficient documentation

## 2020-11-01 NOTE — Progress Notes (Signed)
MRN : [id number]  Corey [name] is a 65 y.o. (1955/05/05) male who presents with chief complaint of leg swelling.  History of Present Illness:   The patient is seen for evaluation of painful lower extremities. Patient notes the pain is variable and not always associated with activity.  The pain is somewhat consistent day to day occurring on most days. The patient notes the pain also occurs with standing and routinely seems worse as the day wears on. The pain has been progressive over the past several years. The patient states these symptoms are causing  a profound negative impact on quality of life and daily activities.  There is a  history of back problems and DJD of the lumbar and sacral spine.    The patient denies rest pain or dangling of an extremity off the side of the bed during the night for relief. No open wounds or sores at this time. No prior interventions or surgeries.  The patient denies a history of DVT or PE. There is no prior history of phlebitis. There is no history of primary lymphedema.  No history of malignancies. No history of trauma or groin or pelvic surgery. There is no history of radiation treatment to the groin or pelvis  The patient denies amaurosis fugax or recent TIA symptoms. There are no recent neurological changes noted. The patient denies recent episodes of angina or shortness of breath   No outpatient medications have been marked as taking for the 11/02/20 encounter (Appointment) with Gilda Crease, Latina Craver, MD.    Past Medical History:  Diagnosis Date   Asthma    Diabetes (HCC)    GERD (gastroesophageal reflux disease)    Hyperlipidemia    Hypertension    Legg-Perthes disease     Past Surgical History:  Procedure Laterality Date   CARDIAC CATHETERIZATION     COLONOSCOPY WITH PROPOFOL N/A 04/16/2019   Procedure: COLONOSCOPY WITH PROPOFOL;  Surgeon: Wyline Mood, MD;  Location: [facility] ENDOSCOPY;  Service: Gastroenterology;  Laterality: N/A;    COLONOSCOPY WITH PROPOFOL N/A 06/14/2019   Procedure: COLONOSCOPY WITH PROPOFOL;  Surgeon: Wyline Mood, MD;  Location: [facility] ENDOSCOPY;  Service: Gastroenterology;  Laterality: N/A;   COLONOSCOPY WITH PROPOFOL N/A 07/30/2019   Procedure: COLONOSCOPY WITH PROPOFOL;  Surgeon: Wyline Mood, MD;  Location: [facility] ENDOSCOPY;  Service: Gastroenterology;  Laterality: N/A;   ESOPHAGOGASTRODUODENOSCOPY (EGD) WITH PROPOFOL N/A 04/16/2019   Procedure: ESOPHAGOGASTRODUODENOSCOPY (EGD) WITH PROPOFOL;  Surgeon: Wyline Mood, MD;  Location: [facility] ENDOSCOPY;  Service: Gastroenterology;  Laterality: N/A;   Foot Surgery     HIP SURGERY     RIGHT/LEFT HEART CATH AND CORONARY ANGIOGRAPHY N/A 07/25/2018   Procedure: RIGHT/LEFT HEART CATH AND CORONARY ANGIOGRAPHY;  Surgeon: Alwyn Pea, MD;  Location: [facility] INVASIVE CV LAB;  Service: Cardiovascular;  Laterality: N/A;    Social History Social History   Tobacco Use   Smoking status: Former    Packs/day: 0.50    Years: 2.00    Pack years: 1.00    Types: Cigarettes    Quit date: 02/01/1980    Years since quitting: 40.7   Smokeless tobacco: Current    Types: Chew   Tobacco comments:    quit 40 years ago   Vaping Use   Vaping Use: Never used  Substance Use Topics   Alcohol use: Not Currently   Drug use: Never    Family History Family History  Problem Relation Age of Onset   Cancer Mother    Heart disease  Mother    Diabetes Father     No Known Allergies   REVIEW OF SYSTEMS (Negative unless checked)  Constitutional: [] Weight loss  [] Fever  [] Chills Cardiac: [] Chest pain   [] Chest pressure   [] Palpitations   [] Shortness of breath when laying flat   [] Shortness of breath with exertion. Vascular:  [x] Pain in legs with walking   [x] Pain in legs at rest  [] History of DVT   [] Phlebitis   [x] Swelling in legs   [] Varicose veins   [] Non-healing ulcers Pulmonary:   [] Uses home oxygen   [] Productive cough   [] Hemoptysis   [] Wheeze  [] COPD   [x] Asthma Neurologic:   [] Dizziness   [] Seizures   [] History of stroke   [] History of TIA  [] Aphasia   [] Vissual changes   [] Weakness or numbness in arm   [] Weakness or numbness in leg Musculoskeletal:   [] Joint swelling   [] Joint pain   [] Low back pain Hematologic:  [] Easy bruising  [] Easy bleeding   [] Hypercoagulable state   [] Anemic Gastrointestinal:  [] Diarrhea   [] Vomiting  [] Gastroesophageal reflux/heartburn   [] Difficulty swallowing. Genitourinary:  [] Chronic kidney disease   [] Difficult urination  [] Frequent urination   [] Blood in urine Skin:  [] Rashes   [] Ulcers  Psychological:  [] History of anxiety   []  History of major depression.  Physical Examination  There were no vitals filed for this visit. There is no height or weight on file to calculate BMI. Gen: WD/WN, NAD Head: [place]/AT, No temporalis wasting.  Ear/Nose/Throat: Hearing grossly intact, nares w/o erythema or drainage, pinna without lesions Eyes: PER, EOMI, sclera nonicteric.  Neck: Supple, no gross masses.  No JVD.  Pulmonary:  Good air movement, no audible wheezing, no use of accessory muscles.  Cardiac: RRR, precordium not hyperdynamic. Vascular:  scattered varicosities present bilaterally.  Moderate venous stasis changes to the legs bilaterally.  2+ hard/firm pitting edema  Vessel Right Left  Radial Palpable Palpable  DP Not  Trace   PT Trace  1+  Gastrointestinal: soft, non-distended. No guarding/no peritoneal signs.  Musculoskeletal: M/S 5/5 throughout.  No deformity.  Neurologic: CN 2-12 intact. Pain and light touch intact in extremities.  Symmetrical.  Speech is fluent. Motor exam as listed above. Psychiatric: Judgment intact, Mood & affect appropriate for pt's clinical situation. Dermatologic: Moderate venous rashes no ulcers noted.  No changes consistent with cellulitis. Lymph : No lichenification or skin changes of chronic lymphedema.  CBC Lab Results  Component Value Date   WBC 10.8 02/24/2020   HGB 19.2 (H) 02/24/2020   HCT  46.8 08/12/2020   MCV 94 02/24/2020   PLT 193 02/24/2020    BMET    Component Value Date/Time   NA 142 10/29/2020 1454   K 4.6 10/29/2020 1454   CL 101 10/29/2020 1454   CO2 23 10/29/2020 1454   GLUCOSE 150 (H) 10/29/2020 1454   GLUCOSE 212 (H) 01/14/2020 0448   BUN 19 10/29/2020 1454   CREATININE 1.39 (H) 10/29/2020 1454   CALCIUM 10.0 10/29/2020 1454   GFRNONAA 55 (L) 02/24/2020 1106   GFRNONAA >60 01/14/2020 0448   GFRAA 63 02/24/2020 1106   Estimated Creatinine Clearance: 62.4 mL/min (A) (by C-G formula based on SCr of 1.39 mg/dL (H)).  COAG No results found for: INR, PROTIME  Radiology No results found.   Assessment/Plan 1. Pain in both lower extremities  Recommend:  The patient has atypical pain symptoms for pure atherosclerotic disease. However, on physical exam there is evidence of mixed venous and arterial disease,  given the diminished pulses and the edema associated with venous changes of the legs.  Noninvasive studies including ABI's and venous ultrasound of the legs will be obtained and the patient will follow up with me to review these studies.  I suspect the patient is c/o pseudoclaudication.  Patient should have an evaluation of his LS spine which I defer to the primary service.  The patient should continue walking and begin a more formal exercise program. The patient should continue his antiplatelet therapy and aggressive treatment of the lipid abnormalities.  The patient should begin wearing graduated compression socks 15-20 mmHg strength to control edema.   - VAS [place] ABI WITH/WO TBI; Future - VAS [place] LOWER EXTREMITY VENOUS REFLUX; Future  2. Chronic venous insufficiency  Recommend:  The patient has atypical pain symptoms for pure atherosclerotic disease. However, on physical exam there is evidence of mixed venous and arterial disease, given the diminished pulses and the edema associated with venous changes of the legs.  Noninvasive studies including  ABI's and venous ultrasound of the legs will be obtained and the patient will follow up with me to review these studies.  I suspect the patient is c/o pseudoclaudication.  Patient should have an evaluation of his LS spine which I defer to the primary service.  The patient should continue walking and begin a more formal exercise program. The patient should continue his antiplatelet therapy and aggressive treatment of the lipid abnormalities.  The patient should begin wearing graduated compression socks 15-20 mmHg strength to control edema.   - VAS [place] LOWER EXTREMITY VENOUS REFLUX; Future  3. Chronic bilateral low back pain with bilateral sciatica  Recommend:  The patient has atypical pain symptoms for pure atherosclerotic disease. However, on physical exam there is evidence of mixed venous and arterial disease, given the diminished pulses and the edema associated with venous changes of the legs.  Noninvasive studies including ABI's and venous ultrasound of the legs will be obtained and the patient will follow up with me to review these studies.  I suspect the patient is c/o pseudoclaudication.  Patient should have an evaluation of his LS spine which I defer to the primary service.  The patient should continue walking and begin a more formal exercise program. The patient should continue his antiplatelet therapy and aggressive treatment of the lipid abnormalities.  The patient should begin wearing graduated compression socks 15-20 mmHg strength to control edema.    4. Lumbar degenerative disc disease Continue NSAID medications as already ordered, these medications have been reviewed and there are no changes at this time.  Continued activity and therapy was stressed.   5. Hypertension associated with type 2 diabetes mellitus (HCC) Continue antihypertensive medications as already ordered, these medications have been reviewed and there are no changes at this time.   6. Coronary artery  disease of native artery of native heart with stable angina pectoris (HCC) Continue cardiac and antihypertensive medications as already ordered and reviewed, no changes at this time.  Continue statin as ordered and reviewed, no changes at this time  Nitrates PRN for chest pain   7. Moderate persistent asthma without complication Continue pulmonary medications and aerosols as already ordered, these medications have been reviewed and there are no changes at this time.     Levora Dredge, MD  11/01/2020 8:57 PM

## 2020-11-02 ENCOUNTER — Encounter (INDEPENDENT_AMBULATORY_CARE_PROVIDER_SITE_OTHER): Payer: Self-pay | Admitting: Vascular Surgery

## 2020-11-02 ENCOUNTER — Ambulatory Visit (INDEPENDENT_AMBULATORY_CARE_PROVIDER_SITE_OTHER): Payer: Medicare Other | Admitting: Vascular Surgery

## 2020-11-02 ENCOUNTER — Other Ambulatory Visit: Payer: Self-pay

## 2020-11-02 VITALS — BP 101/61 | HR 98 | Ht 68.0 in | Wt 212.0 lb

## 2020-11-02 DIAGNOSIS — M5442 Lumbago with sciatica, left side: Secondary | ICD-10-CM

## 2020-11-02 DIAGNOSIS — M79606 Pain in leg, unspecified: Secondary | ICD-10-CM | POA: Insufficient documentation

## 2020-11-02 DIAGNOSIS — M5136 Other intervertebral disc degeneration, lumbar region: Secondary | ICD-10-CM

## 2020-11-02 DIAGNOSIS — I872 Venous insufficiency (chronic) (peripheral): Secondary | ICD-10-CM | POA: Diagnosis not present

## 2020-11-02 DIAGNOSIS — I890 Lymphedema, not elsewhere classified: Secondary | ICD-10-CM

## 2020-11-02 DIAGNOSIS — E1159 Type 2 diabetes mellitus with other circulatory complications: Secondary | ICD-10-CM | POA: Diagnosis not present

## 2020-11-02 DIAGNOSIS — G8929 Other chronic pain: Secondary | ICD-10-CM

## 2020-11-02 DIAGNOSIS — J4540 Moderate persistent asthma, uncomplicated: Secondary | ICD-10-CM

## 2020-11-02 DIAGNOSIS — I152 Hypertension secondary to endocrine disorders: Secondary | ICD-10-CM

## 2020-11-02 DIAGNOSIS — M5441 Lumbago with sciatica, right side: Secondary | ICD-10-CM

## 2020-11-02 DIAGNOSIS — I25118 Atherosclerotic heart disease of native coronary artery with other forms of angina pectoris: Secondary | ICD-10-CM

## 2020-11-02 DIAGNOSIS — M79605 Pain in left leg: Secondary | ICD-10-CM

## 2020-11-02 DIAGNOSIS — M79604 Pain in right leg: Secondary | ICD-10-CM

## 2020-11-03 ENCOUNTER — Ambulatory Visit (INDEPENDENT_AMBULATORY_CARE_PROVIDER_SITE_OTHER): Payer: Medicare Other | Admitting: Licensed Clinical Social Worker

## 2020-11-03 DIAGNOSIS — M5442 Lumbago with sciatica, left side: Secondary | ICD-10-CM

## 2020-11-03 DIAGNOSIS — G894 Chronic pain syndrome: Secondary | ICD-10-CM

## 2020-11-03 DIAGNOSIS — E1159 Type 2 diabetes mellitus with other circulatory complications: Secondary | ICD-10-CM

## 2020-11-03 DIAGNOSIS — E1169 Type 2 diabetes mellitus with other specified complication: Secondary | ICD-10-CM

## 2020-11-03 DIAGNOSIS — I152 Hypertension secondary to endocrine disorders: Secondary | ICD-10-CM

## 2020-11-03 DIAGNOSIS — G8929 Other chronic pain: Secondary | ICD-10-CM

## 2020-11-03 DIAGNOSIS — F4321 Adjustment disorder with depressed mood: Secondary | ICD-10-CM

## 2020-11-03 DIAGNOSIS — E785 Hyperlipidemia, unspecified: Secondary | ICD-10-CM

## 2020-11-03 DIAGNOSIS — F3341 Major depressive disorder, recurrent, in partial remission: Secondary | ICD-10-CM

## 2020-11-03 NOTE — Chronic Care Management (AMB) (Signed)
    Clinical Social Work  Chronic Care Management   Phone Outreach    11/03/2020 Name: ASENCION LOVEDAY MRN: [id number] DOB: [birth year]  MARKICE TORBERT is a 65 y.o. year old male who is a primary care patient of [name], NP .   Reason for referral: Mental Health Counseling and Resources and Grief Counseling.    CCM LCSW reached out to patient today by phone to introduce self, assess needs and offer Care Management services and interventions.     Plan: Appointment was scheduled with CCM LCSW for 11/09/20  Review of patient status, including review of consultants reports, relevant laboratory and other test results, and collaboration with appropriate care team members and the patient's provider was performed as part of comprehensive patient evaluation and provision of care management services.     Jenel Lucks, MSW, LCSW [facility] Care Management [place]  [email or web address] Phone [phone] 10:09 AM

## 2020-11-09 ENCOUNTER — Ambulatory Visit: Payer: Medicare Other | Admitting: Licensed Clinical Social Worker

## 2020-11-09 DIAGNOSIS — F3341 Major depressive disorder, recurrent, in partial remission: Secondary | ICD-10-CM

## 2020-11-09 DIAGNOSIS — E1169 Type 2 diabetes mellitus with other specified complication: Secondary | ICD-10-CM

## 2020-11-09 DIAGNOSIS — E785 Hyperlipidemia, unspecified: Secondary | ICD-10-CM

## 2020-11-09 NOTE — Chronic Care Management (AMB) (Signed)
    Clinical Social Work  Chronic Care Management   Phone Outreach    11/09/2020 Name: Corey [name] MRN: [id number] DOB: [birth year]  Corey [name] is a 65 y.o. year old male who is a primary care patient of [name], NP .   Reason for referral: Mental Health Counseling and Resources.    CCM LCSW reached out to patient today by phone to introduce self, assess needs and offer Care Management services and interventions.    Unable to keep phone appointment today and requested to reschedule.  Plan:Appointment was rescheduled with CCM LCSW for 11/10/20  Review of patient status, including review of consultants reports, relevant laboratory and other test results, and collaboration with appropriate care team members and the patient's provider was performed as part of comprehensive patient evaluation and provision of care management services.    Jenel Lucks, MSW, LCSW [facility] Care Management [place]  [facility] [email or web address] Phone [phone] 4:41 PM

## 2020-11-10 ENCOUNTER — Telehealth: Payer: Medicare Other | Admitting: General Practice

## 2020-11-10 ENCOUNTER — Ambulatory Visit: Payer: Self-pay

## 2020-11-10 DIAGNOSIS — G894 Chronic pain syndrome: Secondary | ICD-10-CM

## 2020-11-10 DIAGNOSIS — E1169 Type 2 diabetes mellitus with other specified complication: Secondary | ICD-10-CM

## 2020-11-10 DIAGNOSIS — E782 Mixed hyperlipidemia: Secondary | ICD-10-CM

## 2020-11-10 DIAGNOSIS — I152 Hypertension secondary to endocrine disorders: Secondary | ICD-10-CM

## 2020-11-10 DIAGNOSIS — F4321 Adjustment disorder with depressed mood: Secondary | ICD-10-CM

## 2020-11-10 DIAGNOSIS — F3341 Major depressive disorder, recurrent, in partial remission: Secondary | ICD-10-CM

## 2020-11-10 DIAGNOSIS — E785 Hyperlipidemia, unspecified: Secondary | ICD-10-CM

## 2020-11-10 DIAGNOSIS — G8929 Other chronic pain: Secondary | ICD-10-CM

## 2020-11-10 DIAGNOSIS — Z794 Long term (current) use of insulin: Secondary | ICD-10-CM

## 2020-11-10 DIAGNOSIS — E1159 Type 2 diabetes mellitus with other circulatory complications: Secondary | ICD-10-CM

## 2020-11-10 DIAGNOSIS — I25118 Atherosclerotic heart disease of native coronary artery with other forms of angina pectoris: Secondary | ICD-10-CM

## 2020-11-10 DIAGNOSIS — M5442 Lumbago with sciatica, left side: Secondary | ICD-10-CM

## 2020-11-10 DIAGNOSIS — E1165 Type 2 diabetes mellitus with hyperglycemia: Secondary | ICD-10-CM

## 2020-11-10 NOTE — Chronic Care Management (AMB) (Signed)
Chronic Care Management   CCM RN Visit Note  11/10/2020 Name: Corey [name] MRN: [id number] DOB: [birth year]  Subjective: Corey [name] is a 65 y.o. year old male who is a primary care patient of Jon Billings, NP. The care management team was consulted for assistance with disease management and care coordination needs.    Engaged with patient by telephone for follow up visit in response to provider referral for case management and/or care coordination services.   Consent to Services:  The patient was given information about Chronic Care Management services, agreed to services, and gave verbal consent prior to initiation of services.  Please see initial visit note for detailed documentation.   Patient agreed to services and verbal consent obtained.   Assessment: Review of patient past medical history, allergies, medications, health status, including review of consultants reports, laboratory and other test data, was performed as part of comprehensive evaluation and provision of chronic care management services.   SDOH (Social Determinants of Health) assessments and interventions performed:  SDOH Interventions    Flowsheet Row Most Recent Value  SDOH Interventions   Food Insecurity Interventions Intervention Not Indicated  Financial Strain Interventions Intervention Not Indicated  Housing Interventions Intervention Not Indicated  Intimate Partner Violence Interventions Intervention Not Indicated  Physical Activity Interventions Other (Comments)  [no structured activity, limited due to chronic pain in legs and back]  Stress Interventions Other (Comment)  [recently his spouse passed away]  Social Connections Interventions Other (Comment)  [has good support system, his daughter and grandchild live with him]  Transportation Interventions Intervention Not Indicated        CCM Care Plan  No Known Allergies  Outpatient Encounter Medications as of 11/10/2020  Medication Sig    Accu-Chek FastClix Lancets MISC USE TO CHECK BLOOD SUGAR AS DIRECTED   acetaminophen (TYLENOL) 500 MG tablet Take 1,000 mg by mouth every 6 (six) hours as needed for moderate pain or headache.   albuterol (PROVENTIL) (2.5 MG/3ML) 0.083% nebulizer solution USE 1 VIAL IN NEBULIZER EVERY 6 HOURS - and as needed.   albuterol (VENTOLIN HFA) 108 (90 Base) MCG/ACT inhaler INHALE 2 PUFFS INTO THE LUNGS EVERY 6 HOURS AS NEEDED FOR WHEEZING OR SHORTNESS OF BREATH (Patient taking differently: Inhale 2 puffs into the lungs every 6 (six) hours as needed for wheezing or shortness of breath.)   amitriptyline (ELAVIL) 50 MG tablet TAKE 1 TO 2 TABLETS(50 TO 100 MG) BY MOUTH AT BEDTIME   aspirin EC 81 MG tablet Take 81 mg by mouth daily.   azelastine (ASTELIN) 0.1 % nasal spray USE 2 SPRAYS IN EACH NOSTRIL TWICE DAILY (Patient taking differently: Place 2 sprays into both nostrils 2 (two) times daily.)   B-D UF III MINI PEN NEEDLES 31G X 5 MM MISC USE TWICE DAILY   Blood Glucose Monitoring Suppl (ONETOUCH VERIO) w/Device KIT Use to check blood sugar 2-3 times daily and document for provider visits.   buPROPion (WELLBUTRIN XL) 150 MG 24 hr tablet TAKE 1 TABLET(150 MG) BY MOUTH DAILY   cetirizine (ZYRTEC) 10 MG tablet TAKE 1 TABLET BY MOUTH DAILY   Cholecalciferol (VITAMIN D3) 5000 units TABS Take 5,000 Units by mouth daily.    ciclopirox (PENLAC) 8 % solution Apply topically at bedtime. Apply over nail and surrounding skin. Apply daily over previous coat. After seven (7) days, may remove with alcohol and continue cycle.   citalopram (CELEXA) 10 MG tablet TAKE 1 TABLET BY MOUTH  DAILY   clopidogrel (PLAVIX) 75 MG  tablet Take 75 mg by mouth daily.   diclofenac sodium (VOLTAREN) 1 % GEL Apply 1 application topically 4 (four) times daily as needed (pain).    famotidine (PEPCID) 40 MG tablet TAKE 1 TABLET(40 MG) BY MOUTH DAILY   fenofibrate (TRICOR) 145 MG tablet TAKE 1 TABLET BY MOUTH  DAILY   furosemide (LASIX) 20 MG  tablet TAKE 1 TABLET(20 MG) BY MOUTH TWICE DAILY   glucose blood test strip Use to check blood sugar 2-3 times daily and document for provider visits.   isosorbide mononitrate (IMDUR) 60 MG 24 hr tablet Take 60 mg by mouth daily.   JARDIANCE 25 MG TABS tablet Take 1 tablet (25 mg total) by mouth daily.   ketoconazole (NIZORAL) 2 % cream Apply to corners of mouth at bedtime   ketorolac (TORADOL) 10 MG tablet Take 1 tablet (10 mg total) by mouth every 6 (six) hours as needed.   Lancets Misc. (ACCU-CHEK FASTCLIX LANCET) KIT 1 Units by Does not apply route 2 (two) times daily.   LANTUS SOLOSTAR 100 UNIT/ML Solostar Pen Inject 85 Units into the skin at bedtime. (Patient taking differently: Inject 70 Units into the skin at bedtime.)   losartan (COZAAR) 50 MG tablet Take 1 tablet (50 mg total) by mouth daily.   magnesium gluconate (MAGONATE) 500 MG tablet Take 500 mg by mouth daily.   meloxicam (MOBIC) 7.5 MG tablet TAKE 1 TABLET(7.5 MG) BY MOUTH DAILY   metFORMIN (GLUCOPHAGE) 1000 MG tablet TAKE 1 TABLET BY MOUTH  TWICE DAILY   metoprolol succinate (TOPROL-XL) 25 MG 24 hr tablet Take 25 mg by mouth daily.   montelukast (SINGULAIR) 10 MG tablet TAKE 1 TABLET(10 MG) BY MOUTH DAILY (Patient taking differently: Take 10 mg by mouth at bedtime.)   NEEDLE, DISP, 18 G 18G X 1" MISC Use as directed   nitroGLYCERIN (NITROSTAT) 0.4 MG SL tablet Place under the tongue.   pantoprazole (PROTONIX) 40 MG tablet Take 1 tablet (40 mg total) by mouth 2 (two) times daily before a meal.   pregabalin (LYRICA) 150 MG capsule Take 1 capsule (150 mg total) by mouth 2 (two) times daily.   primidone (MYSOLINE) 50 MG tablet TAKE 2 TABLETS(100 MG) BY MOUTH DAILY   rosuvastatin (CRESTOR) 20 MG tablet TAKE 1 TABLET BY MOUTH  DAILY   Semaglutide, 1 MG/DOSE, 4 MG/3ML SOPN Inject 1 mg into the skin once a week.   SYRINGE-NEEDLE, DISP, 3 ML (LUER LOCK SAFETY SYRINGES) 21G X 1-1/2" 3 ML MISC Use as directed for testosterone  administration   testosterone cypionate (DEPOTESTOSTERONE CYPIONATE) 200 MG/ML injection Inject 1.5 mLs (300 mg total) into the muscle every 14 (fourteen) days.   tiotropium (SPIRIVA) 18 MCG inhalation capsule Place into inhaler and inhale.   tiZANidine (ZANAFLEX) 4 MG tablet Take 1 tablet (4 mg total) by mouth every 8 (eight) hours as needed for muscle spasms.   triamcinolone (KENALOG) 0.025 % ointment Apply 1 application topically 2 (two) times daily.   WIXELA INHUB 250-50 MCG/DOSE AEPB Inhale 1 puff into the lungs 2 (two) times daily.   No facility-administered encounter medications on file as of 11/10/2020.    Patient Active Problem List   Diagnosis Date Noted   Chronic venous insufficiency 11/02/2020   Leg pain 11/02/2020   Lymphedema 11/01/2020   Sinus congestion 03/04/2020   Chronic bilateral low back pain with bilateral sciatica 12/06/2018   Lumbar degenerative disc disease 12/06/2018   Lumbar facet arthropathy 12/06/2018   Sacroiliac joint pain 12/06/2018  Chronic pain syndrome 12/06/2018   Bilateral primary osteoarthritis of knee 12/06/2018   GERD (gastroesophageal reflux disease) 11/12/2018   Coronary artery disease of native artery of native heart with stable angina pectoris ([place]) 08/16/2018   Diabetic peripheral neuropathy associated with type 2 diabetes mellitus ([place]) 04/30/2018   Former smoker 04/30/2018   Obesity (BMI 30-39.9) 04/30/2018   Type 2 diabetes mellitus with hyperglycemia, with long-term current use of insulin ([place]) 04/30/2018   Hyperlipidemia associated with type 2 diabetes mellitus ([place]) 04/21/2017   Asthma 04/21/2017   RLS (restless legs syndrome) 04/21/2017   Allergic rhinitis 04/21/2017   Hypertension associated with type 2 diabetes mellitus ([place]) 04/21/2017   Major depression 04/21/2017   Tremor 04/21/2017   Hypogonadism male 04/21/2017    Conditions to be addressed/monitored:CAD, HTN, HLD, DMII, Depression, and chronic pain  Care Plan : RNCM;  Coronary Artery Disease (Adult) and HLD  Updates made by Vanita Ingles, RN since 11/10/2020 12:00 AM     Problem: RNCM: Disease Progression (Coronary Artery Disease) and HLD   Priority: High     Long-Range Goal: RNCM: HLD and CAD   Start Date: 07/21/2020  Expected End Date: 07/21/2021  This Visit's Progress: On track  Recent Progress: On track  Priority: High  Note:   Current Barriers:  Poorly controlled hyperlipidemia, complicated by stress, HTN, DM uncontrolled, COPD Current antihyperlipidemic regimen: Tricor 145 mg QD and Crestor 20 mg QD Most recent lipid panel:     Component Value Date/Time   CHOL 156 10/29/2020 1454   CHOL 157 01/29/2018 1038   TRIG 181 (H) 10/29/2020 1454   TRIG 292 (H) 01/29/2018 1038   HDL 38 (L) 10/29/2020 1454   CHOLHDL 4.1 10/29/2020 1454   VLDL 58 (H) 01/29/2018 1038   [place] 87 10/29/2020 1454   ASCVD risk enhancing conditions: age 47, DM, HTN, former smoker Unable to independently manage CAD and HLD Lacks social connections Does not contact provider office for questions/concerns RN Care Manager Clinical Goal(s):  patient will work with Consulting civil engineer, providers, and care team towards execution of optimized self-health management plan patient will verbalize understanding of plan for effective management of CAD and HLD patient will work with RNCM and pcp  to address needs related to CAD and HLD patient will take all medications exactly as prescribed and will call provider for medication related questions patient will attend all scheduled medical appointments: no upcoming appointments with the pcp, knows to call for changes, several appointments with specialist patient will demonstrate improved adherence to prescribed treatment plan for CAD and HLD the patient will demonstrate ongoing self health care management ability Interventions: Collaboration with Jon Billings, NP regarding development and update of comprehensive plan of care as  evidenced by provider attestation and co-signature Inter-disciplinary care team collaboration (see longitudinal plan of care) Medication review performed; medication list updated in electronic medical record. 11-10-2020: The patient confirms compliance with medications at this time Inter-disciplinary care team collaboration (see longitudinal plan of care) Referred to pharmacy team for assistance with HLD medication management Evaluation of current treatment plan related to CAD and HLD and patient's adherence to plan as established by provider. 09-15-2020: The patient states he is eating better. The patient denies any issues with cardiac health. Is stressed out about his wife. Will continue to monitor. 11-10-2020: The patient states he is doing okay. Taking things one day at a time. States he is eating well. Denies any issues with following dietary restrictions.  Advised patient to follow  a heart healthy/ADA diet, be mindful of stress levels and other factors that impact health and well being, medication compliance and calling the office for changes in condition, or questions.  Provided education to patient re: heart healthy/ADA diet and monitoring fats in diet. The patient likes fresh fruits and vegetables and states he is trying to watch what he eats better. Also education on exercise and keeping stress levels down.  Reviewed medications with patient and discussed compliance. 11-10-2020: The patient endorses compliance with medication regimen  Reviewed scheduled/upcoming provider appointments including: 01-28-2021 at 11 am Discussed plans with patient for ongoing care management follow up and provided patient with direct contact information for care management team Patient Goals/Self-Care Activities: - call for medicine refill 2 or 3 days before it runs out - call if I am sick and can't take my medicine - keep a list of all the medicines I take; vitamins and herbals too - learn to read medicine  labels - use a pillbox to sort medicine - change to whole grain breads, cereal, pasta - drink 6 to 8 glasses of water each day - eat 3 to 5 servings of fruits and vegetables each day - eat 5 or 6 small meals each day - eat fish at least once per week - fill half the plate with nonstarchy vegetables - limit fast food meals to no more than 1 per week - manage portion size - prepare main meal at home 3 to 5 days each week - read food labels for fat, fiber, carbohydrates and portion size - be open to making changes - I can manage, know and watch for signs of a heart attack - if I have chest pain, call for help - learn about small changes that will make a big difference - learn my personal risk factors - barriers to treatment adherence reviewed and addressed - difficulty of making life-long changes acknowledged - functional limitation screening reviewed - healthy lifestyle promoted - individualized medical nutrition therapy provided - medication-adherence assessment completed - medication side effects managed - rescue (action) plan developed - self-awareness of signs/symptoms of worsening disease encouraged Follow Up Plan: Telephone follow up appointment with care management team member scheduled for: 01-12-2021 at 09:00 am      Care Plan : RNCM: Hypertension (Adult)  Updates made by Vanita Ingles, RN since 11/10/2020 12:00 AM     Problem: RNCM: Hypertension (Hypertension)   Priority: Medium     Long-Range Goal: RNCM: Hypertension Monitored   Start Date: 07/21/2020  Expected End Date: 07/21/2021  This Visit's Progress: On track  Recent Progress: On track  Priority: Medium  Note:   Objective:  Last practice recorded BP readings:  BP Readings from Last 3 Encounters:  11/02/20 101/61  10/29/20 134/82  09/08/20 122/74   Most recent eGFR/CrCl:  Lab Results  Component Value Date   EGFR 57 (L) 10/29/2020    No components found for: CRCL Current Barriers:  Knowledge  Deficits related to basic understanding of hypertension pathophysiology and self care management Knowledge Deficits related to understanding of medications prescribed for management of hypertension Limited Social Support Unable to independently manage HTN  Lacks social connections Does not contact provider office for questions/concerns Case Manager Clinical Goal(s):  patient will verbalize understanding of plan for hypertension management patient will attend all scheduled medical appointments: no upcoming with the pcp, knows to call for changes, has several coming up with specialist.  patient will demonstrate improved adherence to prescribed treatment plan for hypertension  as evidenced by taking all medications as prescribed, monitoring and recording blood pressure as directed, adhering to low sodium/DASH diet patient will demonstrate improved health management independence as evidenced by checking blood pressure as directed and notifying PCP if SBP>150 or DBP > 90, taking all medications as prescribe, and adhering to a low sodium diet as discussed. patient will verbalize basic understanding of hypertension disease process and self health management plan as evidenced by compliance with heart healthy/ADA diet, compliance with medications and working with the CCM team to manage health and well being.  Interventions:  Collaboration with Jon Billings, NP regarding development and update of comprehensive plan of care as evidenced by provider attestation and co-signature Inter-disciplinary care team collaboration (see longitudinal plan of care) Evaluation of current treatment plan related to hypertension self management and patient's adherence to plan as established by provider. 09-15-2020: The patient has been running some elevated blood pressures recently. The patient states he has been under a lot of stress concerned about his wife Horris Latino who has cancer. She is currently in the hospital in [facility]. Empathetic listening and support. Education provided on taking care of self and monitoring for changes so he could be there for [place] when she gets home. The patient verbalized understanding. 11-10-2020: The patient is having better control over his blood pressures. Has recently lost his wife and since then he is focusing on getting himself back on track with his health and well being. He states he is sleeping good and eating well. His daughter and grandchild are living with him and this is helpful for him. He denies any acute finding related to cardiac health and well being.  Provided education to patient re: stroke prevention, s/s of heart attack and stroke, DASH diet, complications of uncontrolled blood pressure Reviewed medications with patient and discussed importance of compliance. 10-11--2022: Endorses compliance with medications.  Discussed plans with patient for ongoing care management follow up and provided patient with direct contact information for care management team Advised patient, providing education and rationale, to monitor blood pressure daily and record, calling PCP for findings outside established parameters.  Reviewed scheduled/upcoming provider appointments including: 01-28-2021 at 11 am Self-Care Activities: - Self administers medications as prescribed Attends all scheduled provider appointments Calls provider office for new concerns, questions, or BP outside discussed parameters Checks BP and records as discussed Follows a low sodium diet/DASH diet Patient Goals: - check blood pressure weekly - choose a place to take my blood pressure (home, clinic or office, retail store) - write blood pressure results in a log or diary - agree on reward when goals are met - agree to work together to make changes - ask questions to understand - have a family meeting to talk about healthy habits - learn about high blood pressure - blood pressure trends reviewed - depression  screen reviewed - home or ambulatory blood pressure monitoring encouraged Follow Up Plan: Telephone follow up appointment with care management team member scheduled for: 01-12-2021 at 0900 am    Care Plan : RNCM: Diabetes Type 2 (Adult)  Updates made by Vanita Ingles, RN since 11/10/2020 12:00 AM     Problem: RNCM: Glycemic Management (Diabetes, Type 2)   Priority: High     Long-Range Goal: RNCM: Glycemic Management Optimized   Start Date: 07/21/2020  Expected End Date: 07/21/2021  This Visit's Progress: On track  Recent Progress: On track  Priority: High  Note:   Objective:  Lab Results  Component Value Date  HGBA1C 7.5 (H) 10/29/2020  8.9% at Endocrinologist on 08-31-2020 Lab Results  Component Value Date   CREATININE 1.39 (H) 10/29/2020   CREATININE 1.40 (H) 07/29/2020   CREATININE 1.09 04/28/2020   Lab Results  Component Value Date   EGFR 56 (L) 07/29/2020   Current Barriers:  Knowledge Deficits related to basic Diabetes pathophysiology and self care/management Knowledge Deficits related to medications used for management of diabetes Limited Social Support Unable to independently manage DM as evidence of hemoglobin A1C of 10.0, 09-15-2020: Newest A1C 8.9% on 08-31-2020, 10-29-2020: A1C of 7.5 Does not adhere to provider recommendations re: following a heart healthy/ADA diet Lacks social connections Does not contact provider office for questions/concerns Case Manager Clinical Goal(s):  patient will demonstrate improved adherence to prescribed treatment plan for diabetes self care/management as evidenced by: daily monitoring and recording of CBG  adherence to ADA/ carb modified diet exercise 4/5 days/week adherence to prescribed medication regimen contacting provider for new or worsened symptoms or questions Interventions:  Collaboration with Jon Billings, NP regarding development and update of comprehensive plan of care as evidenced by provider attestation and  co-signature Inter-disciplinary care team collaboration (see longitudinal plan of care) Provided education to patient about basic DM disease process Reviewed medications with patient and discussed importance of medication adherence. 11-10-2020: Endorses compliance with medications Discussed plans with patient for ongoing care management follow up and provided patient with direct contact information for care management team Provided patient with written educational materials related to hypo and hyperglycemia and importance of correct treatment. 11-10-2020: The patient was experiencing some lows with taking Lantus 85 units at HS, since changing to 70 units recently the patient states he has not experienced any lows. Will continue to monitor.  Reviewed scheduled/upcoming provider appointments including: 01-28-2021 at 11 am Advised patient, providing education and rationale, to check cbg BID and record, calling pcp for findings outside established parameters.  09-15-2020: The patient states his fasting blood sugars have been around 150-160. Denies any lows since last month. Had not been eating well due to stress of his wife's illness but he is doing better now. Education on fasting blood sugar <130 and post prandial of <180.  The patient is checking Bid and recording. Encouraged heart healthy/ADA diet. Will continue to monitor and educate accordingly. 11-10-2020: the patient is checking as recommended. States it is in much better range. Is in the 100 range consistently. Review of patient status, including review of consultants reports, relevant laboratory and other test results, and medications completed. Self-Care Activities - UNABLE to independently manage DM Attends all scheduled provider appointments Checks blood sugars as prescribed and utilize hyper and hypoglycemia protocol as needed Adheres to prescribed ADA/carb modified Patient Goals: - check blood sugar at prescribed times - check blood sugar  before and after exercise - check blood sugar if I feel it is too high or too low - enter blood sugar readings and medication or insulin into daily log - take the blood sugar log to all doctor visits - change to whole grain breads, cereal, pasta - set goal weight - drink 6 to 8 glasses of water each day - eat fish at least once per week - fill half of plate with vegetables - limit fast food meals to no more than 1 per week - manage portion size - prepare main meal at home 3 to 5 days each week - read food labels for fat, fiber, carbohydrates and portion size - schedule appointment with eye doctor - check  feet daily for cuts, sores or redness - keep feet up while sitting - trim toenails straight across - wash and dry feet carefully every day - wear comfortable, cotton socks - wear comfortable, well-fitting shoes Last foot exam on 09-10-2020 with trimming of nails - barriers to adherence to treatment plan identified - blood glucose monitoring encouraged - blood glucose readings reviewed - individualized medical nutrition therapy provided - mutual A1C goal set or reviewed - resources required to improve adherence to care identified - self-awareness of signs/symptoms of hypo or hyperglycemia encouraged - use of blood glucose monitoring log promoted Follow Up Plan: Telephone follow up appointment with care management team member scheduled for: 01-12-2021 at 0900 am          Care Plan : RNCM: Depression (Adult) and grief  Updates made by Vanita Ingles, RN since 11/10/2020 12:00 AM     Problem: RNCM: Depression Identification (Depression)   Priority: High     Long-Range Goal: RNCM: Depressive Symptoms Identified and Grief   Start Date: 07/21/2020  Expected End Date: 07/21/2021  This Visit's Progress: On track  Recent Progress: On track  Priority: High  Note:   Current Barriers:  Ineffective Self Health Maintenance  Unable to independently manage depression and grief  Lacks  social connections Does not contact provider office for questions/concerns Recent death of his mother on 07-30-20 and his wife with lung cancer actively taking chemotherapy- treatments x 2 months now. 11-10-2020: The patients wife has passed Clinical Goal(s):  Collaboration with Jon Billings, NP regarding development and update of comprehensive plan of care as evidenced by provider attestation and co-signature Inter-disciplinary care team collaboration (see longitudinal plan of care) patient will work with care management team to address care coordination and chronic disease management needs related to Disease Management Educational Needs Care Coordination Mental Health Counseling Caregiver Stress support Other grief due to the recent loss of his mother on 07-30-20    Interventions:  Evaluation of current treatment plan related to Depression and grief  , Limited social support, Mental Health Concerns , and recent death of his mother on 07/30/2020 and his wife actively taking chemotherapy for lung cancer self-management and patient's adherence to plan as established by provider. The patient has been under a lot of stress recently. His mother passed away last week (2020-07-30) and also his wife is actively taking chemo for treatment of lung cancer. The patient states that he is managing okay. He denies any acute distress but knows this is impacting his overall health and well being. Empathetic listening and support given. Will touch base with the LCSW for assistance with depression and grief process for added support and recommendations. Will continue to monitor. 09-15-2020: The patient is still experiencing a lot of stress. Currently his wife is in the hospital in [place]. He states the cancer has moved to her spine. He is concerned about her. Empathetic listening and support. The patient states he is doing better with his conditions. 11-10-2020: The patients wife has passed. She was at home and  he was able to be with her and he is thankful that she was at home with him. He states he is doing okay. His daughter and grandchild are living with him and this is helpful. He is taking one day at a time and feels like he is managing. He is focusing on his health and well being now. Reflective listening and support given.  Collaboration with Jon Billings, NP regarding development and update of comprehensive  plan of care as evidenced by provider attestation       and co-signature Inter-disciplinary care team collaboration (see longitudinal plan of care) Discussed plans with patient for ongoing care management follow up and provided patient with direct contact information for care management team Self Care Activities:  Patient verbalizes understanding of plan to manage depression and grief process  Self administers medications as prescribed Attends all scheduled provider appointments Calls pharmacy for medication refills Performs ADL's independently Performs IADL's independently Calls provider office for new concerns or questions Patient Goals: - anxiety screen reviewed - depression screen reviewed - medication list reviewed Follow Up Plan: Telephone follow up appointment with care management team member scheduled for: [phone] at 0900 am    Care Plan : RNCM: Chronic Pain (Adult)  Updates made by Vanita Ingles, RN since 11/10/2020 12:00 AM     Problem: RNCM; Chronic Pain Management (Chronic Pain)   Priority: High     Long-Range Goal: RNCM: Chronic Pain Managed   Start Date: 11/10/2020  Expected End Date: 11/10/2021  This Visit's Progress: On track  Priority: High  Note:   Current Barriers:  Knowledge Deficits related to managing acute/chronic pain Non-adherence to scheduled provider appointments Non-adherence to prescribed medication regimen Difficulty obtaining medications Chronic Disease Management support and education needs related to chronic pain Unable to  independently manage chronic back and leg pain Lacks social connections Does not contact provider office for questions/concerns Requesting handicap placard  Nurse Case Manager Clinical Goal(s):  patient will verbalize understanding of plan for managing pain patient will work with [place] to address chronic pain and discomfort and alternative pain relief measures, on going support and education patient will attend all scheduled medical appointments: 01-28-2021 patient will demonstrate use of different relaxation  skills and/or diversional activities to assist with pain reduction (distraction, imagery, relaxation, massage, acupressure, TENS, heat, and cold application patient will report pain at a level less than 3 to 4 on a 10-10 rating scale patient will use pharmacological and nonpharmacological pain relief strategies patient will verbalize acceptable level of pain relief and ability to engage in desired activities patient will engage in desired activities without an increase in pain level Interventions:  Collaboration with Jon Billings, NP regarding development and update of comprehensive plan of care as evidenced by provider attestation and co-signature Inter-disciplinary care team collaboration (see longitudinal plan of care) - deep breathing, relaxation and mindfulness use promoted - effectiveness of pharmacologic therapy monitored - medication-induced side effects managed - misuse of pain medication assessed - motivation and barriers to change assessed and addressed - mutually acceptable comfort goal set - pain assessed - pain treatment goals reviewed - participation in support group encouraged - premedication prior to activity encouraged Evaluation of current treatment plan related to chronic back and bilateral leg pain and patient's adherence to plan as established by provider. Advised patient to call the office for changes in level or intensity of pain or unresolved  pain Provided education to patient re: working with specialist, keeping appointments, using alternative methods of pain relief, and calling for changees Reviewed medications with patient and discussed compliance Discussed plans with patient for ongoing care management follow up and provided patient with direct contact information for care management team Allow patient to maintain a diary of pain ratings, timing, precipitating events, medications, treatments, and what works best to relieve pain,  Refer to support groups and self-help groups Educate patient about the use of pharmacological interventions for pain management- antianxiety, antidepressants, NSAIDS,  opioid analgesics,  Explain the importance of lifestyle modifications to effective pain management. 11-10-2020: The patient has test scheduled for tomorrow for further evaluation and treatment options. The patient states that he is using the compression socks and this is helping. He is hoping the test will help the MD determine what will best help him. He has asked for a handicap placard as he had to turn in his wife's. Paperwork printed and placed in pcp box for signature. The office will call the patient once the paperwork has been signed. The patient denies any falls. Education on fall prevention and safety.  Patient Goals/Self Care Activities:   Self-administers medications as prescribed Attends all scheduled provider appointments Calls pharmacy for medication refills Calls provider office for new concerns or questions Follow Up Plan: Telephone follow up appointment with care management team member scheduled for:01-12-2021 at 0900 am      Task: RNCM: Alleviate Barriers to Chronic Pain Management Completed 11/10/2020  Outcome: Positive  Note:   Care Management Activities:    - deep breathing, relaxation and mindfulness use promoted - effectiveness of pharmacologic therapy monitored - medication-induced side effects managed - misuse  of pain medication assessed - motivation and barriers to change assessed and addressed - mutually acceptable comfort goal set - pain assessed - pain treatment goals reviewed - premedication prior to activity encouraged    Notes:      Plan:Telephone follow up appointment with care management team member scheduled for:  01-12-2021 at 0900 am  [place], MSN, [facility] Mobile: [phone]

## 2020-11-10 NOTE — Patient Instructions (Signed)
Visit Information  PATIENT GOALS:  Goals Addressed             This Visit's Progress    RNCM: Monitor and Manage My Blood Sugar-Diabetes Type 2       Timeframe:  Long-Range Goal Priority:  High Start Date: 07-21-2020                            Expected End Date:        07-31-2021               Follow Up Date 1213-2022    - check blood sugar at prescribed times - check blood sugar before and after exercise - check blood sugar if I feel it is too high or too low - enter blood sugar readings and medication or insulin into daily log - take the blood sugar log to all doctor visits    Why is this important?   Checking your blood sugar at home helps to keep it from getting very high or very low.  Writing the results in a diary or log helps the doctor know how to care for you.  Your blood sugar log should have the time, date and the results.  Also, write down the amount of insulin or other medicine that you take.  Other information, like what you ate, exercise done and how you were feeling, will also be helpful.     Lab Results  Component Value Date   HGBA1C 7.5 (H) 10/29/2020    Notes: The patient is checking blood sugars BID- working on goals to get fasting <130 and post prandial of <180. 09-15-2020: The patient saw endocrinologist on 08-31-2020 and new A1C was 8.9%. Patient states his blood sugars have been 150-160's. Denies lows this month. Education and support given. Will continue to monitor. 11-10-2020: The patient had recent A1C and it has decreased from 9.7 to 7.5. The patient praised for getting A1C decreased. The patient states he was having some lows with the Lantus dosage the Md had him on and the dose was decreased to 70 units at bed time. The patient states he has had no further lows since the changes has been made. Will continue to monitor.      RNCM: Track and Manage My Symptoms-Depression       Timeframe:  Long-Range Goal Priority:  High Start Date:   07-21-2020                           Expected End Date:      07-21-2021                Follow Up Date 01/12/2021    - avoid negative self-talk - develop a personal safety plan - develop a plan to deal with triggers like holidays, anniversaries - exercise at least 2 to 3 times per week - have a plan for how to handle bad days - spend time or talk with others at least 2 to 3 times per week - spend time or talk with others every day    Why is this important?   Keeping track of your progress will help your treatment team find the right mix of medicine and therapy for you.  Write in your journal every day.  Day-to-day changes in depression symptoms are normal. It may be more helpful to check your progress at the end of each week instead  of every day.     Notes: recent death of his mother on 08-12-20 and his wife is actively going through chemo for lung cancer. 09-15-2020: The patient is doing okay. Still very concerned about his wife. She is currently in the hospital in [facility]. She now has cancer in her spine also. The patient states he is eating better. Empathetic listening and support given. 11-10-2020: The patient lost his wife recently and he is doing okay. He takes it one day at a time. His daughter and grandchild are living with him and that has helped him with coping of losing his wife. He states he is forgetting his appointments. Reminded the patient to write down appointments in a calendar and he is also using my chart. Emotional support and reflective listening. Will continue to monitor.      RNCM; Manage Chronic Pain       Timeframe:  Long-Range Goal Priority:  High Start Date:  11-10-2020                           Expected End Date:    11-10-2021                   Follow Up Date 01/12/2021    - call for medicine refill 2 or 3 days before it runs out - develop a personal pain management plan - keep track of prescription refills - plan exercise or activity when pain is best controlled - prioritize  tasks for the day - track times pain is worst and when it is best - track what makes the pain worse and what makes it better - work slower and less intense when having pain    Why is this important?   Day-to-day life can be hard when you have chronic pain.  Pain medicine is just one piece of the treatment puzzle.  You can try these action steps to help you manage your pain.    11-10-2020: the patient saw the vascular surgeon on 11-02-2020 for recommendations. Will have further testing on 11-11-2020. Is using the compression socks and this is helpful. Education on monitoring for changes in pain and discomfort. The patient did ask for assistance with a handicap placard and paperwork printed and put in signature box for pcp to sign. The patient states he will be in the office next week for AWV and will pick up then.         Patient verbalizes understanding of instructions provided today and agrees to view in MyChart.   Telephone follow up appointment with care management team member scheduled for: 01-12-2021 at 0900 am  Alto [place] RN, MSN, CCM Community Care Coordinator [place]  [facility] Mobile: [phone]

## 2020-11-11 ENCOUNTER — Encounter (INDEPENDENT_AMBULATORY_CARE_PROVIDER_SITE_OTHER): Payer: Self-pay | Admitting: Nurse Practitioner

## 2020-11-11 ENCOUNTER — Other Ambulatory Visit: Payer: Self-pay

## 2020-11-11 ENCOUNTER — Ambulatory Visit (INDEPENDENT_AMBULATORY_CARE_PROVIDER_SITE_OTHER): Payer: Medicare Other

## 2020-11-11 ENCOUNTER — Ambulatory Visit (INDEPENDENT_AMBULATORY_CARE_PROVIDER_SITE_OTHER): Payer: Medicare Other | Admitting: Nurse Practitioner

## 2020-11-11 ENCOUNTER — Telehealth: Payer: Self-pay

## 2020-11-11 VITALS — BP 104/60 | HR 76 | Ht 68.0 in | Wt 216.0 lb

## 2020-11-11 DIAGNOSIS — I872 Venous insufficiency (chronic) (peripheral): Secondary | ICD-10-CM | POA: Diagnosis not present

## 2020-11-11 DIAGNOSIS — M79604 Pain in right leg: Secondary | ICD-10-CM

## 2020-11-11 DIAGNOSIS — M79605 Pain in left leg: Secondary | ICD-10-CM

## 2020-11-11 DIAGNOSIS — I890 Lymphedema, not elsewhere classified: Secondary | ICD-10-CM

## 2020-11-11 DIAGNOSIS — E1159 Type 2 diabetes mellitus with other circulatory complications: Secondary | ICD-10-CM | POA: Diagnosis not present

## 2020-11-11 DIAGNOSIS — I152 Hypertension secondary to endocrine disorders: Secondary | ICD-10-CM

## 2020-11-11 NOTE — Telephone Encounter (Signed)
Informed patient handicap placard form is ready for pick up.  Form in patient pick up box.

## 2020-11-12 ENCOUNTER — Encounter (INDEPENDENT_AMBULATORY_CARE_PROVIDER_SITE_OTHER): Payer: Medicare Other

## 2020-11-12 ENCOUNTER — Ambulatory Visit (INDEPENDENT_AMBULATORY_CARE_PROVIDER_SITE_OTHER): Payer: Medicare Other | Admitting: Vascular Surgery

## 2020-11-15 ENCOUNTER — Other Ambulatory Visit: Payer: Self-pay | Admitting: Nurse Practitioner

## 2020-11-15 DIAGNOSIS — K219 Gastro-esophageal reflux disease without esophagitis: Secondary | ICD-10-CM

## 2020-11-15 NOTE — Telephone Encounter (Signed)
Requested Prescriptions  Pending Prescriptions Disp Refills  . famotidine (PEPCID) 40 MG tablet [Pharmacy Med Name: FAMOTIDINE 40MG  TABLETS] 90 tablet 0    Sig: TAKE 1 TABLET(40 MG) BY MOUTH DAILY     Gastroenterology:  H2 Antagonists Passed - 11/15/2020  3:38 PM      Passed - Valid encounter within last 12 months    Recent Outpatient Visits          2 weeks ago Hypertension associated with type 2 diabetes mellitus (HCC)   [facility], NP   3 months ago Hypertension associated with type 2 diabetes mellitus (HCC)   [facility], NP   3 months ago Type 2 diabetes mellitus with hyperglycemia, with long-term current use of insulin (HCC)   [facility] [place], Megan P, DO   6 months ago Coronary artery disease of native artery of native heart with stable angina pectoris (HCC)   [facility], NP   8 months ago Type 2 diabetes mellitus with hyperglycemia, with long-term current use of insulin (HCC)   [facility] [place], [place], NP      Future Appointments            Tomorrow  [facility]   In 2 months [facility], NP [facility], [facility]   In 3 months [facility], MD [facility]   In 9 months [place], Deirdre Evener, MD [facility]

## 2020-11-16 ENCOUNTER — Ambulatory Visit (INDEPENDENT_AMBULATORY_CARE_PROVIDER_SITE_OTHER): Payer: Medicare Other

## 2020-11-16 VITALS — Ht 68.0 in | Wt 212.0 lb

## 2020-11-16 DIAGNOSIS — Z0000 Encounter for general adult medical examination without abnormal findings: Secondary | ICD-10-CM

## 2020-11-16 NOTE — Progress Notes (Signed)
I connected with Corey [name] today by telephone and verified that I am speaking with the correct person using two identifiers. Location patient: home Location provider: work Persons participating in the virtual visit: Ronda Kazmi, Glenna Durand LPN.   I discussed the limitations, risks, security and privacy concerns of performing an evaluation and management service by telephone and the availability of in person appointments. I also discussed with the patient that there may be a patient responsible charge related to this service. The patient expressed understanding and verbally consented to this telephonic visit.    Interactive audio and video telecommunications were attempted between this provider and patient, however failed, due to patient having technical difficulties OR patient did not have access to video capability.  We continued and completed visit with audio only.     Vital signs may be patient reported or missing.  Subjective:   Corey [name] is a 65 y.o. male who presents for Medicare Annual/Subsequent preventive examination.  Review of Systems     Cardiac Risk Factors include: advanced age (>36mn, >>40women);diabetes mellitus;dyslipidemia;hypertension;male gender;smoking/ tobacco exposure     Objective:    Today's Vitals   11/16/20 1112  Weight: 212 lb (96.2 kg)  Height: 5' 8" (1.727 m)   Body mass index is 32.23 kg/m.  Advanced Directives 11/16/2020 03/12/2020 01/11/2020 11/15/2019 09/17/2019 07/30/2019 06/14/2019  Does Patient Have a Medical Advance Directive? _0  No No  Would patient like information on creating a medical advance directive? - No - Patient declined No - Patient declined - No - Patient declined No - Patient declined -    Current Medications (verified) Outpatient Encounter Medications as of 11/16/2020  Medication Sig   Accu-Chek FastClix Lancets MISC USE TO CHECK BLOOD SUGAR AS DIRECTED   acetaminophen (TYLENOL) 500 MG tablet Take  1,000 mg by mouth every 6 (six) hours as needed for moderate pain or headache.   albuterol (PROVENTIL) (2.5 MG/3ML) 0.083% nebulizer solution USE 1 VIAL IN NEBULIZER EVERY 6 HOURS - and as needed.   albuterol (VENTOLIN HFA) 108 (90 Base) MCG/ACT inhaler INHALE 2 PUFFS INTO THE LUNGS EVERY 6 HOURS AS NEEDED FOR WHEEZING OR SHORTNESS OF BREATH (Patient taking differently: Inhale 2 puffs into the lungs every 6 (six) hours as needed for wheezing or shortness of breath.)   amitriptyline (ELAVIL) 50 MG tablet TAKE 1 TO 2 TABLETS(50 TO 100 MG) BY MOUTH AT BEDTIME   aspirin EC 81 MG tablet Take 81 mg by mouth daily.   azelastine (ASTELIN) 0.1 % nasal spray USE 2 SPRAYS IN EACH NOSTRIL TWICE DAILY (Patient taking differently: Place 2 sprays into both nostrils 2 (two) times daily.)   B-D UF III MINI PEN NEEDLES 31G X 5 MM MISC USE TWICE DAILY   Blood Glucose Monitoring Suppl (ONETOUCH VERIO) w/Device KIT Use to check blood sugar 2-3 times daily and document for provider visits.   buPROPion (WELLBUTRIN XL) 150 MG 24 hr tablet TAKE 1 TABLET(150 MG) BY MOUTH DAILY   cetirizine (ZYRTEC) 10 MG tablet TAKE 1 TABLET BY MOUTH DAILY   Cholecalciferol (VITAMIN D3) 5000 units TABS Take 5,000 Units by mouth daily.    ciclopirox (PENLAC) 8 % solution Apply topically at bedtime. Apply over nail and surrounding skin. Apply daily over previous coat. After seven (7) days, may remove with alcohol and continue cycle.   citalopram (CELEXA) 10 MG tablet TAKE 1 TABLET BY MOUTH  DAILY   clopidogrel (PLAVIX) 75 MG tablet Take 75 mg by  mouth daily.   diclofenac sodium (VOLTAREN) 1 % GEL Apply 1 application topically 4 (four) times daily as needed (pain).    famotidine (PEPCID) 40 MG tablet TAKE 1 TABLET(40 MG) BY MOUTH DAILY   fenofibrate (TRICOR) 145 MG tablet TAKE 1 TABLET BY MOUTH  DAILY   furosemide (LASIX) 20 MG tablet TAKE 1 TABLET(20 MG) BY MOUTH TWICE DAILY   glucose blood test strip Use to check blood sugar 2-3 times daily  and document for provider visits.   isosorbide mononitrate (IMDUR) 60 MG 24 hr tablet Take 60 mg by mouth daily.   JARDIANCE 25 MG TABS tablet Take 1 tablet (25 mg total) by mouth daily.   ketoconazole (NIZORAL) 2 % cream Apply to corners of mouth at bedtime   ketorolac (TORADOL) 10 MG tablet Take 1 tablet (10 mg total) by mouth every 6 (six) hours as needed.   Lancets Misc. (ACCU-CHEK FASTCLIX LANCET) KIT 1 Units by Does not apply route 2 (two) times daily.   LANTUS SOLOSTAR 100 UNIT/ML Solostar Pen Inject 85 Units into the skin at bedtime. (Patient taking differently: Inject 70 Units into the skin at bedtime.)   losartan (COZAAR) 50 MG tablet Take 1 tablet (50 mg total) by mouth daily.   magnesium gluconate (MAGONATE) 500 MG tablet Take 500 mg by mouth daily.   meloxicam (MOBIC) 7.5 MG tablet TAKE 1 TABLET(7.5 MG) BY MOUTH DAILY   metFORMIN (GLUCOPHAGE) 1000 MG tablet TAKE 1 TABLET BY MOUTH  TWICE DAILY   metoprolol succinate (TOPROL-XL) 25 MG 24 hr tablet Take 25 mg by mouth daily.   montelukast (SINGULAIR) 10 MG tablet TAKE 1 TABLET(10 MG) BY MOUTH DAILY (Patient taking differently: Take 10 mg by mouth at bedtime.)   NEEDLE, DISP, 18 G 18G X 1" MISC Use as directed   nitroGLYCERIN (NITROSTAT) 0.4 MG SL tablet Place under the tongue.   pantoprazole (PROTONIX) 40 MG tablet Take 1 tablet (40 mg total) by mouth 2 (two) times daily before a meal.   pregabalin (LYRICA) 150 MG capsule Take 1 capsule (150 mg total) by mouth 2 (two) times daily.   primidone (MYSOLINE) 50 MG tablet TAKE 2 TABLETS(100 MG) BY MOUTH DAILY   rosuvastatin (CRESTOR) 20 MG tablet TAKE 1 TABLET BY MOUTH  DAILY   Semaglutide, 1 MG/DOSE, 4 MG/3ML SOPN Inject 1 mg into the skin once a week.   SYRINGE-NEEDLE, DISP, 3 ML (LUER LOCK SAFETY SYRINGES) 21G X 1-1/2" 3 ML MISC Use as directed for testosterone administration   testosterone cypionate (DEPOTESTOSTERONE CYPIONATE) 200 MG/ML injection Inject 1.5 mLs (300 mg total) into the  muscle every 14 (fourteen) days.   tiotropium (SPIRIVA) 18 MCG inhalation capsule Place into inhaler and inhale.   tiZANidine (ZANAFLEX) 4 MG tablet Take 1 tablet (4 mg total) by mouth every 8 (eight) hours as needed for muscle spasms.   triamcinolone (KENALOG) 0.025 % ointment Apply 1 application topically 2 (two) times daily.   WIXELA INHUB 250-50 MCG/DOSE AEPB Inhale 1 puff into the lungs 2 (two) times daily.   No facility-administered encounter medications on file as of 11/16/2020.    Allergies (verified) Patient has no known allergies.   History: Past Medical History:  Diagnosis Date   Asthma    Diabetes (HCC)    GERD (gastroesophageal reflux disease)    Hyperlipidemia    Hypertension    Legg-Perthes disease    Past Surgical History:  Procedure Laterality Date   CARDIAC CATHETERIZATION     COLONOSCOPY WITH   PROPOFOL N/A 04/16/2019   Procedure: COLONOSCOPY WITH PROPOFOL;  Surgeon: Anna, Kiran, MD;  Location: [facility];  Service: Gastroenterology;  Laterality: N/A;   COLONOSCOPY WITH PROPOFOL N/A 06/14/2019   Procedure: COLONOSCOPY WITH PROPOFOL;  Surgeon: Anna, Kiran, MD;  Location: [facility];  Service: Gastroenterology;  Laterality: N/A;   COLONOSCOPY WITH PROPOFOL N/A 07/30/2019   Procedure: COLONOSCOPY WITH PROPOFOL;  Surgeon: Anna, Kiran, MD;  Location: [facility];  Service: Gastroenterology;  Laterality: N/A;   ESOPHAGOGASTRODUODENOSCOPY (EGD) WITH PROPOFOL N/A 04/16/2019   Procedure: ESOPHAGOGASTRODUODENOSCOPY (EGD) WITH PROPOFOL;  Surgeon: Anna, Kiran, MD;  Location: [facility];  Service: Gastroenterology;  Laterality: N/A;   Foot Surgery     HIP SURGERY     RIGHT/LEFT HEART CATH AND CORONARY ANGIOGRAPHY N/A 07/25/2018   Procedure: RIGHT/LEFT HEART CATH AND CORONARY ANGIOGRAPHY;  Surgeon: Callwood, Dwayne D, MD;  Location: [facility];  Service: Cardiovascular;  Laterality: N/A;   Family History  Problem Relation Age of Onset   Cancer Mother     Heart disease Mother    Diabetes Father    Social History   Socioeconomic History   Marital status: Divorced    Spouse name: Not on file   Number of children: Not on file   Years of education: Not on file   Highest education level: 8th grade  Occupational History   Occupation: disability  Tobacco Use   Smoking status: Former    Packs/day: 0.50    Years: 2.00    Pack years: 1.00    Types: Cigarettes    Quit date: 02/01/1980    Years since quitting: 40.8   Smokeless tobacco: Current    Types: Chew   Tobacco comments:    quit 40 years ago   Vaping Use   Vaping Use: Never used  Substance and Sexual Activity   Alcohol use: Not Currently   Drug use: Never   Sexual activity: Not on file  Other Topics Concern   Not on file  Social History Narrative   Not on file   Social Determinants of Health   Financial Resource Strain: Low Risk    Difficulty of Paying Living Expenses: Not hard at all  Food Insecurity: No Food Insecurity   Worried About Running Out of Food in the Last Year: Never true   Ran Out of Food in the Last Year: Never true  Transportation Needs: No Transportation Needs   Lack of Transportation (Medical): No   Lack of Transportation (Non-Medical): No  Physical Activity: Insufficiently Active   Days of Exercise per Week: 3 days   Minutes of Exercise per Session: 30 min  Stress: No Stress Concern Present   Feeling of Stress : Not at all  Social Connections: Socially Isolated   Frequency of Communication with Friends and Family: More than three times a week   Frequency of Social Gatherings with Friends and Family: More than three times a week   Attends Religious Services: Never   Active Member of Clubs or Organizations: No   Attends Club or Organization Meetings: Never   Marital Status: Widowed    Tobacco Counseling Ready to quit: Not Answered Counseling given: Not Answered Tobacco comments: quit 40 years ago    Clinical Intake:  Pre-visit preparation  completed: Yes  Pain : No/denies pain     Nutritional Status: BMI > 30  Obese Nutritional Risks: None Diabetes: Yes  How often do you need to have someone help you when you read instructions, pamphlets, or other   written materials from your doctor or pharmacy?: 1 - Never What is the last grade level you completed in school?: 8th grade  Diabetic? Yes Nutrition Risk Assessment:  Has the patient had any N/V/D within the last 2 months?  No  Does the patient have any non-healing wounds?  No  Has the patient had any unintentional weight loss or weight gain?  No   Diabetes:  Is the patient diabetic?  Yes  If diabetic, was a CBG obtained today?  No  Did the patient bring in their glucometer from home?  No  How often do you monitor your CBG's? Twice daily.   Financial Strains and Diabetes Management:  Are you having any financial strains with the device, your supplies or your medication? No .  Does the patient want to be seen by Chronic Care Management for management of their diabetes?  No  Would the patient like to be referred to a Nutritionist or for Diabetic Management?  No   Diabetic Exams:  Diabetic Eye Exam: Completed 04/10/2020 Diabetic Foot Exam: Completed 02/24/2020   Interpreter Needed?: No  Information entered by :: NAllen LPN   Activities of Daily Living In your present state of health, do you have any difficulty performing the following activities: 11/16/2020 02/24/2020  Hearing? N N  Vision? N N  Difficulty concentrating or making decisions? N N  Walking or climbing stairs? N N  Dressing or bathing? N N  Doing errands, shopping? N N  Preparing Food and eating ? N -  Using the Toilet? N -  In the past six months, have you accidently leaked urine? N -  Do you have problems with loss of bowel control? N -  Managing your Medications? N -  Managing your Finances? N -  Housekeeping or managing your Housekeeping? N -  Some recent data might be hidden    Patient  Care Team: Jon Billings, NP as PCP - General [facility] Busing Nobie Putnam, RN as Case Manager (General Practice)  Indicate any recent Medical Services you may have received from other than [facility] providers in the past year (date may be approximate).     Assessment:   This is a routine wellness examination for Keeghan.  Hearing/Vision screen No results found.  Dietary issues and exercise activities discussed: Current Exercise Habits: Home exercise routine, Type of exercise: treadmill, Time (Minutes): 30, Frequency (Times/Week): 3, Weekly Exercise (Minutes/Week): 90   Goals Addressed             This Visit's Progress    Patient Stated       11/16/2020, wants to control A1C       Depression Screen PHQ 2/9 Scores 11/16/2020 11/10/2020 07/28/2020 04/28/2020 03/04/2020 02/24/2020 11/15/2019  PHQ - 2 Score 0 0 0 0 0 0 0  PHQ- 9 Score - - 0 0 0 0 0    Fall Risk Fall Risk  11/16/2020 07/28/2020 03/12/2020 03/04/2020 02/24/2020  Falls in the past year? 0 0 0 0 0  Number falls in past yr: - 0 - - 0  Injury with Fall? - 0 - - 0  Risk for fall due to : Medication side effect No Fall Risks - - -  Follow up Falls evaluation completed;Education provided;Falls prevention discussed Falls evaluation completed - - -    FALL RISK PREVENTION PERTAINING TO THE HOME:  Any stairs in or around the home? No  If so, are there any without handrails? N/a Home free of loose throw rugs in walkways,  pet beds, electrical cords, etc? Yes  Adequate lighting in your home to reduce risk of falls? Yes   ASSISTIVE DEVICES UTILIZED TO PREVENT FALLS:  Life alert? No  Use of a cane, walker or w/c? No  Grab bars in the bathroom? Yes  Shower chair or bench in shower? Yes  Elevated toilet seat or a handicapped toilet? No   TIMED UP AND GO:  Was the test performed? No .      Cognitive Function:     6CIT Screen 11/16/2020 11/15/2019 10/26/2017  What Year? 0 points 0 points 0 points  What month? 0 points 0 points 0  points  What time? 3 points 0 points 0 points  Count back from 20 4 points 0 points 0 points  Months in reverse 0 points 4 points 0 points  Repeat phrase 6 points 0 points 2 points  Total Score 13 4 2    Immunizations Immunization History  Administered Date(s) Administered   Influenza,inj,Quad PF,6+ Mos 09/21/2017, 10/13/2018, 02/24/2020, 10/29/2020   Influenza-Unspecified 10/13/2016   Pneumococcal Polysaccharide-23 02/26/2016   Tdap 07/25/2017   Zoster Recombinat (Shingrix) 07/14/2016    TDAP status: Up to date  Flu Vaccine status: Up to date  Pneumococcal vaccine status: Up to date  Covid-19 vaccine status: Declined, Education has been provided regarding the importance of this vaccine but patient still declined. Advised may receive this vaccine at local pharmacy or Health Dept.or vaccine clinic. Aware to provide a copy of the vaccination record if obtained from local pharmacy or Health Dept. Verbalized acceptance and understanding.  Qualifies for Shingles Vaccine? Yes   Zostavax completed No   Shingrix Completed?: did not get second dose says pharmacy told him he does not need it  Screening Tests Health Maintenance  Topic Date Due   COVID-19 Vaccine (1) 12/02/2020 (Originally 11/24/1955)   Zoster Vaccines- Shingrix (2 of 2) 02/16/2021 (Originally 09/08/2016)   FOOT EXAM  02/23/2021   OPHTHALMOLOGY EXAM  04/10/2021   HEMOGLOBIN A1C  04/28/2021   TETANUS/TDAP  07/26/2027   COLONOSCOPY (Pts 45-49yrs Insurance coverage will need to be confirmed)  07/29/2029   INFLUENZA VACCINE  Completed   Hepatitis C Screening  Completed   HIV Screening  Completed   HPV VACCINES  Aged Out    Health Maintenance  There are no preventive care reminders to display for this patient.   Colorectal cancer screening: Type of screening: Colonoscopy. Completed 07/30/2019. Repeat every 10 years  Lung Cancer Screening: (Low Dose CT Chest recommended if Age 55-80 years, 30 pack-year currently  smoking OR have quit w/in 15years.) does not qualify.   Lung Cancer Screening Referral: no  Additional Screening:  Hepatitis C Screening: does qualify; Completed 07/25/2017  Vision Screening: Recommended annual ophthalmology exams for early detection of glaucoma and other disorders of the eye. Is the patient up to date with their annual eye exam?  Yes  Who is the provider or what is the name of the office in which the patient attends annual eye exams? Does  not remember name If pt is not established with a provider, would they like to be referred to a provider to establish care? No .   Dental Screening: Recommended annual dental exams for proper oral hygiene  Community Resource Referral / Chronic Care Management: CRR required this visit?  No   CCM required this visit?  No      Plan:     I have personally reviewed and noted the following in the patient's chart:     Medical and social history Use of alcohol, tobacco or illicit drugs  Current medications and supplements including opioid prescriptions. Patient is not currently taking opioid prescriptions. Functional ability and status Nutritional status Physical activity Advanced directives List of other physicians Hospitalizations, surgeries, and ER visits in previous 12 months Vitals Screenings to include cognitive, depression, and falls Referrals and appointments  In addition, I have reviewed and discussed with patient certain preventive protocols, quality metrics, and best practice recommendations. A written personalized care plan for preventive services as well as general preventive health recommendations were provided to patient.     Kellie Simmering, LPN   [id number]   Nurse Notes:

## 2020-11-16 NOTE — Telephone Encounter (Signed)
Pt picked up DMV placard form

## 2020-11-16 NOTE — Patient Instructions (Signed)
[name] , Thank you for taking time to come for your Medicare Wellness Visit. I appreciate your ongoing commitment to your health goals. Please review the following plan we discussed and let me know if I can assist you in the future.   Screening recommendations/referrals: Colonoscopy: completed 07/30/2019 Recommended yearly ophthalmology/optometry visit for glaucoma screening and checkup Recommended yearly dental visit for hygiene and checkup  Vaccinations: Influenza vaccine: completed 10/29/2020 Pneumococcal vaccine: completed 02/26/2016 Tdap vaccine: completed 07/25/2017, due 07/26/2027 Shingles vaccine: had one dose says pharmacist said he only needed one   Covid-19: decline  Advanced directives: Advance directive discussed with you today.   Conditions/risks identified: tobacco use  Next appointment: Follow up in one year for your annual wellness visit   Preventive Care 40-64 Years, Male Preventive care refers to lifestyle choices and visits with your health care provider that can promote health and wellness. What does preventive care include? A yearly physical exam. This is also called an annual well check. Dental exams once or twice a year. Routine eye exams. Ask your health care provider how often you should have your eyes checked. Personal lifestyle choices, including: Daily care of your teeth and gums. Regular physical activity. Eating a healthy diet. Avoiding tobacco and drug use. Limiting alcohol use. Practicing safe sex. Taking low-dose aspirin every day starting at age 63. What happens during an annual well check? The services and screenings done by your health care provider during your annual well check will depend on your age, overall health, lifestyle risk factors, and family history of disease. Counseling  Your health care provider may ask you questions about your: Alcohol use. Tobacco use. Drug use. Emotional well-being. Home and relationship  well-being. Sexual activity. Eating habits. Work and work Astronomer. Screening  You may have the following tests or measurements: Height, weight, and BMI. Blood pressure. Lipid and cholesterol levels. These may be checked every 5 years, or more frequently if you are over 44 years old. Skin check. Lung cancer screening. You may have this screening every year starting at age 66 if you have a 30-pack-year history of smoking and currently smoke or have quit within the past 15 years. Fecal occult blood test (FOBT) of the stool. You may have this test every year starting at age 67. Flexible sigmoidoscopy or colonoscopy. You may have a sigmoidoscopy every 5 years or a colonoscopy every 10 years starting at age 48. Prostate cancer screening. Recommendations will vary depending on your family history and other risks. Hepatitis C blood test. Hepatitis B blood test. Sexually transmitted disease (STD) testing. Diabetes screening. This is done by checking your blood sugar (glucose) after you have not eaten for a while (fasting). You may have this done every 1-3 years. Discuss your test results, treatment options, and if necessary, the need for more tests with your health care provider. Vaccines  Your health care provider may recommend certain vaccines, such as: Influenza vaccine. This is recommended every year. Tetanus, diphtheria, and acellular pertussis (Tdap, Td) vaccine. You may need a Td booster every 10 years. Zoster vaccine. You may need this after age 40. Pneumococcal 13-valent conjugate (PCV13) vaccine. You may need this if you have certain conditions and have not been vaccinated. Pneumococcal polysaccharide (PPSV23) vaccine. You may need one or two doses if you smoke cigarettes or if you have certain conditions. Talk to your health care provider about which screenings and vaccines you need and how often you need them. This information is not intended to replace advice  given to you by your  health care provider. Make sure you discuss any questions you have with your health care provider. Document Released: 02/13/2015 Document Revised: 10/07/2015 Document Reviewed: 11/18/2014 Elsevier Interactive Patient Education  2017 [employer].  Fall Prevention in the Home Falls can cause injuries. They can happen to people of all ages. There are many things you can do to make your home safe and to help prevent falls. What can I do on the outside of my home? Regularly fix the edges of walkways and driveways and fix any cracks. Remove anything that might make you trip as you walk through a door, such as a raised step or threshold. Trim any bushes or trees on the path to your home. Use bright outdoor lighting. Clear any walking paths of anything that might make someone trip, such as rocks or tools. Regularly check to see if handrails are loose or broken. Make sure that both sides of any steps have handrails. Any raised decks and porches should have guardrails on the edges. Have any leaves, snow, or ice cleared regularly. Use sand or salt on walking paths during winter. Clean up any spills in your garage right away. This includes oil or grease spills. What can I do in the bathroom? Use night lights. Install grab bars by the toilet and in the tub and shower. Do not use towel bars as grab bars. Use non-skid mats or decals in the tub or shower. If you need to sit down in the shower, use a plastic, non-slip stool. Keep the floor dry. Clean up any water that spills on the floor as soon as it happens. Remove soap buildup in the tub or shower regularly. Attach bath mats securely with double-sided non-slip rug tape. Do not have throw rugs and other things on the floor that can make you trip. What can I do in the bedroom? Use night lights. Make sure that you have a light by your bed that is easy to reach. Do not use any sheets or blankets that are too big for your bed. They should not hang down  onto the floor. Have a firm chair that has side arms. You can use this for support while you get dressed. Do not have throw rugs and other things on the floor that can make you trip. What can I do in the kitchen? Clean up any spills right away. Avoid walking on wet floors. Keep items that you use a lot in easy-to-reach places. If you need to reach something above you, use a strong step stool that has a grab bar. Keep electrical cords out of the way. Do not use floor polish or wax that makes floors slippery. If you must use wax, use non-skid floor wax. Do not have throw rugs and other things on the floor that can make you trip. What can I do with my stairs? Do not leave any items on the stairs. Make sure that there are handrails on both sides of the stairs and use them. Fix handrails that are broken or loose. Make sure that handrails are as long as the stairways. Check any carpeting to make sure that it is firmly attached to the stairs. Fix any carpet that is loose or worn. Avoid having throw rugs at the top or bottom of the stairs. If you do have throw rugs, attach them to the floor with carpet tape. Make sure that you have a light switch at the top of the stairs and the bottom of  the stairs. If you do not have them, ask someone to add them for you. What else can I do to help prevent falls? Wear shoes that: Do not have high heels. Have rubber bottoms. Are comfortable and fit you well. Are closed at the toe. Do not wear sandals. If you use a stepladder: Make sure that it is fully opened. Do not climb a closed stepladder. Make sure that both sides of the stepladder are locked into place. Ask someone to hold it for you, if possible. Clearly mark and make sure that you can see: Any grab bars or handrails. First and last steps. Where the edge of each step is. Use tools that help you move around (mobility aids) if they are needed. These include: Canes. Walkers. Scooters. Crutches. Turn  on the lights when you go into a dark area. Replace any light bulbs as soon as they burn out. Set up your furniture so you have a clear path. Avoid moving your furniture around. If any of your floors are uneven, fix them. If there are any pets around you, be aware of where they are. Review your medicines with your doctor. Some medicines can make you feel dizzy. This can increase your chance of falling. Ask your doctor what other things that you can do to help prevent falls. This information is not intended to replace advice given to you by your health care provider. Make sure you discuss any questions you have with your health care provider. Document Released: 11/13/2008 Document Revised: 06/25/2015 Document Reviewed: 02/21/2014 Elsevier Interactive Patient Education  2017 [employer].

## 2020-11-17 ENCOUNTER — Other Ambulatory Visit: Payer: Self-pay | Admitting: Nurse Practitioner

## 2020-11-17 DIAGNOSIS — E1141 Type 2 diabetes mellitus with diabetic mononeuropathy: Secondary | ICD-10-CM

## 2020-11-21 ENCOUNTER — Encounter (INDEPENDENT_AMBULATORY_CARE_PROVIDER_SITE_OTHER): Payer: Self-pay | Admitting: Nurse Practitioner

## 2020-11-21 NOTE — Progress Notes (Signed)
Subjective:    Patient ID: Corey [name], male    DOB: 11-24-1955, 65 y.o.   MRN: [id number] Chief Complaint  Patient presents with   Follow-up    Pt conv. [place]     Patient is seen for evaluation of leg pain and leg swelling. The patient first noticed the swelling remotely. The swelling is associated with pain and discoloration. The pain and swelling worsens with prolonged dependency and improves with elevation. The pain is unrelated to activity.  The patient notes that in the morning the legs are significantly improved but they steadily worsened throughout the course of the day. The patient also notes a steady worsening of the discoloration in the ankle and shin area.   The patient denies claudication symptoms.  The patient denies symptoms consistent with rest pain.  The patient has a history of DJD and LS spine disease.  The patient has no had any past angiography, interventions or vascular surgery.  Elevation makes the leg symptoms better, dependency makes them much worse. There is no history of ulcerations. The patient denies any recent changes in medications.  The patient has not been wearing graduated compression.  The patient denies a history of DVT or PE. There is no prior history of phlebitis. There is no history of primary lymphedema.  No history of malignancies. No history of trauma or groin or pelvic surgery. There is no history of radiation treatment to the groin or pelvis  The patient denies amaurosis fugax or recent TIA symptoms. There are no recent neurological changes noted. The patient denies recent episodes of angina or shortness of breath   Today noninvasive studies show no evidence of DVT or superficial thrombophlebitis bilaterally.  No evidence of deep venous insufficiency bilaterally or superficial venous reflux bilaterally.   Review of Systems  Cardiovascular:  Positive for leg swelling.  All other systems reviewed and are negative.     Objective:    Physical Exam Vitals reviewed.  HENT:     Head: Normocephalic.  Cardiovascular:     Rate and Rhythm: Normal rate.     Pulses: Decreased pulses.  Pulmonary:     Effort: Pulmonary effort is normal.  Skin:    General: Skin is warm and dry.  Neurological:     Mental Status: He is alert and oriented to person, place, and time.  Psychiatric:        Mood and Affect: Mood normal.        Behavior: Behavior normal.        Thought Content: Thought content normal.        Judgment: Judgment normal.    BP 104/60   Pulse 76   Ht 5' 8"  (1.727 m)   Wt 216 lb (98 kg)   BMI 32.84 kg/m   Past Medical History:  Diagnosis Date   Asthma    Diabetes (HCC)    GERD (gastroesophageal reflux disease)    Hyperlipidemia    Hypertension    Legg-Perthes disease     Social History   Socioeconomic History   Marital status: Divorced    Spouse name: Not on file   Number of children: Not on file   Years of education: Not on file   Highest education level: 8th grade  Occupational History   Occupation: disability  Tobacco Use   Smoking status: Former    Packs/day: 0.50    Years: 2.00    Pack years: 1.00    Types: Cigarettes    Quit date:  02/01/1980    Years since quitting: 40.8   Smokeless tobacco: Current    Types: Chew   Tobacco comments:    quit 40 years ago   Vaping Use   Vaping Use: Never used  Substance and Sexual Activity   Alcohol use: Not Currently   Drug use: Never   Sexual activity: Not on file  Other Topics Concern   Not on file  Social History Narrative   Not on file   Social Determinants of Health   Financial Resource Strain: Low Risk    Difficulty of Paying Living Expenses: Not hard at all  Food Insecurity: No Food Insecurity   Worried About Charity fundraiser in the Last Year: Never true   Ran Out of Food in the Last Year: Never true  Transportation Needs: No Transportation Needs   Lack of Transportation (Medical): No   Lack of Transportation (Non-Medical): No   Physical Activity: Insufficiently Active   Days of Exercise per Week: 3 days   Minutes of Exercise per Session: 30 min  Stress: No Stress Concern Present   Feeling of Stress : Not at all  Social Connections: Socially Isolated   Frequency of Communication with Friends and Family: More than three times a week   Frequency of Social Gatherings with Friends and Family: More than three times a week   Attends Religious Services: Never   Marine scientist or Organizations: No   Attends Archivist Meetings: Never   Marital Status: Widowed  Intimate Partner Violence: Not At Risk   Fear of Current or Ex-Partner: No   Emotionally Abused: No   Physically Abused: No   Sexually Abused: No    Past Surgical History:  Procedure Laterality Date   CARDIAC CATHETERIZATION     COLONOSCOPY WITH PROPOFOL N/A 04/16/2019   Procedure: COLONOSCOPY WITH PROPOFOL;  Surgeon: Jonathon Bellows, MD;  Location: [facility] ENDOSCOPY;  Service: Gastroenterology;  Laterality: N/A;   COLONOSCOPY WITH PROPOFOL N/A 06/14/2019   Procedure: COLONOSCOPY WITH PROPOFOL;  Surgeon: Jonathon Bellows, MD;  Location: [facility] ENDOSCOPY;  Service: Gastroenterology;  Laterality: N/A;   COLONOSCOPY WITH PROPOFOL N/A 07/30/2019   Procedure: COLONOSCOPY WITH PROPOFOL;  Surgeon: Jonathon Bellows, MD;  Location: [facility] ENDOSCOPY;  Service: Gastroenterology;  Laterality: N/A;   ESOPHAGOGASTRODUODENOSCOPY (EGD) WITH PROPOFOL N/A 04/16/2019   Procedure: ESOPHAGOGASTRODUODENOSCOPY (EGD) WITH PROPOFOL;  Surgeon: Jonathon Bellows, MD;  Location: [facility] ENDOSCOPY;  Service: Gastroenterology;  Laterality: N/A;   Foot Surgery     HIP SURGERY     RIGHT/LEFT HEART CATH AND CORONARY ANGIOGRAPHY N/A 07/25/2018   Procedure: RIGHT/LEFT HEART CATH AND CORONARY ANGIOGRAPHY;  Surgeon: Yolonda Kida, MD;  Location: [facility];  Service: Cardiovascular;  Laterality: N/A;    Family History  Problem Relation Age of Onset   Cancer Mother    Heart disease Mother     Diabetes Father     No Known Allergies  CBC Latest Ref Rng & Units 08/12/2020 02/24/2020 02/11/2020  WBC 3.4 - 10.8 x10E3/uL - 10.8 -  Hemoglobin 13.0 - 17.7 g/dL - 19.2(H) -  Hematocrit 37.5 - 51.0 % 46.8 57.3(H) 50.4  Platelets 150 - 450 x10E3/uL - 193 -      CMP     Component Value Date/Time   NA 142 10/29/2020 1454   K 4.6 10/29/2020 1454   CL 101 10/29/2020 1454   CO2 23 10/29/2020 1454   GLUCOSE 150 (H) 10/29/2020 1454   GLUCOSE 212 (H) 01/14/2020 0448  BUN 19 10/29/2020 1454   CREATININE 1.39 (H) 10/29/2020 1454   CALCIUM 10.0 10/29/2020 1454   PROT 7.0 10/29/2020 1454   ALBUMIN 4.8 10/29/2020 1454   AST 40 10/29/2020 1454   AST 22 01/29/2018 1038   ALT 48 (H) 10/29/2020 1454   ALT 35 01/29/2018 1038   ALKPHOS 127 (H) 10/29/2020 1454   BILITOT 0.5 10/29/2020 1454   GFRNONAA 55 (L) 02/24/2020 1106   GFRNONAA >60 01/14/2020 0448   GFRAA 63 02/24/2020 1106     No results found.     Assessment & Plan:   1. Lymphedema I have had a long discussion with the patient regarding swelling and why it  causes symptoms.  Patient will begin wearing graduated compression stockings class 1 (20-30 mmHg) on a daily basis a prescription was given. The patient will  beginning wearing the stockings first thing in the morning and removing them in the evening. The patient is instructed specifically not to sleep in the stockings.   In addition, behavioral modification will be initiated.  This will include frequent elevation, use of over the counter pain medications and exercise such as walking.  I have reviewed systemic causes for chronic edema such as liver, kidney and cardiac etiologies.  The patient denies problems with these organ systems.    Consideration for a lymph pump will also be made based upon the effectiveness of conservative therapy.  This would help to improve the edema control and prevent sequela such as ulcers and infections    2. Pain in both lower extremities   Recommend:  The patient has atypical pain symptoms for pure atherosclerotic disease. However, on physical exam there is evidence of mixed venous and arterial disease, given the diminished pulses and the edema associated with venous changes of the legs.  Noninvasive studies including ABI's of the legs will be obtained and the patient will follow up with me to review these studies.  I suspect the patient is c/o pseudoclaudication.  Patient should have an evaluation of his LS spine which I defer to the primary service.  The patient should continue walking and begin a more formal exercise program. The patient should continue his antiplatelet therapy and aggressive treatment of the lipid abnormalities.  The patient should begin wearing graduated compression socks 15-20 mmHg strength to control edema.    3. Hypertension associated with type 2 diabetes mellitus ([place]) Continue antihypertensive medications as already ordered, these medications have been reviewed and there are no changes at this time.    Current Outpatient Medications on File Prior to Visit  Medication Sig Dispense Refill   Accu-Chek FastClix Lancets MISC USE TO CHECK BLOOD SUGAR AS DIRECTED 102 each 3   acetaminophen (TYLENOL) 500 MG tablet Take 1,000 mg by mouth every 6 (six) hours as needed for moderate pain or headache.     albuterol (PROVENTIL) (2.5 MG/3ML) 0.083% nebulizer solution USE 1 VIAL IN NEBULIZER EVERY 6 HOURS - and as needed. 3 mL 2   albuterol (VENTOLIN HFA) 108 (90 Base) MCG/ACT inhaler INHALE 2 PUFFS INTO THE LUNGS EVERY 6 HOURS AS NEEDED FOR WHEEZING OR SHORTNESS OF BREATH (Patient taking differently: Inhale 2 puffs into the lungs every 6 (six) hours as needed for wheezing or shortness of breath.) 25.5 g 0   amitriptyline (ELAVIL) 50 MG tablet TAKE 1 TO 2 TABLETS(50 TO 100 MG) BY MOUTH AT BEDTIME 180 tablet 0   aspirin EC 81 MG tablet Take 81 mg by mouth daily.  azelastine (ASTELIN) 0.1 % nasal spray USE 2  SPRAYS IN EACH NOSTRIL TWICE DAILY (Patient taking differently: Place 2 sprays into both nostrils 2 (two) times daily.) 30 mL 1   B-D UF III MINI PEN NEEDLES 31G X 5 MM MISC USE TWICE DAILY 100 each 11   Blood Glucose Monitoring Suppl (ONETOUCH VERIO) w/Device KIT Use to check blood sugar 2-3 times daily and document for provider visits. 1 kit 0   buPROPion (WELLBUTRIN XL) 150 MG 24 hr tablet TAKE 1 TABLET(150 MG) BY MOUTH DAILY 90 tablet 1   cetirizine (ZYRTEC) 10 MG tablet TAKE 1 TABLET BY MOUTH DAILY 90 tablet 1   Cholecalciferol (VITAMIN D3) 5000 units TABS Take 5,000 Units by mouth daily.      ciclopirox (PENLAC) 8 % solution Apply topically at bedtime. Apply over nail and surrounding skin. Apply daily over previous coat. After seven (7) days, may remove with alcohol and continue cycle. 6.6 mL 0   citalopram (CELEXA) 10 MG tablet TAKE 1 TABLET BY MOUTH  DAILY 90 tablet 1   clopidogrel (PLAVIX) 75 MG tablet Take 75 mg by mouth daily.     diclofenac sodium (VOLTAREN) 1 % GEL Apply 1 application topically 4 (four) times daily as needed (pain).      fenofibrate (TRICOR) 145 MG tablet TAKE 1 TABLET BY MOUTH  DAILY 90 tablet 3   furosemide (LASIX) 20 MG tablet TAKE 1 TABLET(20 MG) BY MOUTH TWICE DAILY 180 tablet 1   glucose blood test strip Use to check blood sugar 2-3 times daily and document for provider visits. 100 each 12   isosorbide mononitrate (IMDUR) 60 MG 24 hr tablet Take 60 mg by mouth daily.     JARDIANCE 25 MG TABS tablet Take 1 tablet (25 mg total) by mouth daily. 90 tablet 1   ketoconazole (NIZORAL) 2 % cream Apply to corners of mouth at bedtime 30 g 2   ketorolac (TORADOL) 10 MG tablet Take 1 tablet (10 mg total) by mouth every 6 (six) hours as needed. 20 tablet 0   Lancets Misc. (ACCU-CHEK FASTCLIX LANCET) KIT 1 Units by Does not apply route 2 (two) times daily. 3 kit 3   LANTUS SOLOSTAR 100 UNIT/ML Solostar Pen Inject 85 Units into the skin at bedtime. (Patient taking differently:  Inject 70 Units into the skin at bedtime.) 45 mL 1   losartan (COZAAR) 50 MG tablet Take 1 tablet (50 mg total) by mouth daily. 90 tablet 1   magnesium gluconate (MAGONATE) 500 MG tablet Take 500 mg by mouth daily.     meloxicam (MOBIC) 7.5 MG tablet TAKE 1 TABLET(7.5 MG) BY MOUTH DAILY 90 tablet 0   metFORMIN (GLUCOPHAGE) 1000 MG tablet TAKE 1 TABLET BY MOUTH  TWICE DAILY 180 tablet 1   metoprolol succinate (TOPROL-XL) 25 MG 24 hr tablet Take 25 mg by mouth daily.     montelukast (SINGULAIR) 10 MG tablet TAKE 1 TABLET(10 MG) BY MOUTH DAILY (Patient taking differently: Take 10 mg by mouth at bedtime.) 90 tablet 3   NEEDLE, DISP, 18 G 18G X 1" MISC Use as directed 15 each 0   nitroGLYCERIN (NITROSTAT) 0.4 MG SL tablet Place under the tongue.     pantoprazole (PROTONIX) 40 MG tablet Take 1 tablet (40 mg total) by mouth 2 (two) times daily before a meal. 180 tablet 3   pregabalin (LYRICA) 150 MG capsule Take 1 capsule (150 mg total) by mouth 2 (two) times daily. 180 capsule 1  primidone (MYSOLINE) 50 MG tablet TAKE 2 TABLETS(100 MG) BY MOUTH DAILY 180 tablet 1   rosuvastatin (CRESTOR) 20 MG tablet TAKE 1 TABLET BY MOUTH  DAILY 90 tablet 1   Semaglutide, 1 MG/DOSE, 4 MG/3ML SOPN Inject 1 mg into the skin once a week.     SYRINGE-NEEDLE, DISP, 3 ML (LUER LOCK SAFETY SYRINGES) 21G X 1-1/2" 3 ML MISC Use as directed for testosterone administration 15 each 0   testosterone cypionate (DEPOTESTOSTERONE CYPIONATE) 200 MG/ML injection Inject 1.5 mLs (300 mg total) into the muscle every 14 (fourteen) days. 10 mL 0   tiotropium (SPIRIVA) 18 MCG inhalation capsule Place into inhaler and inhale.     tiZANidine (ZANAFLEX) 4 MG tablet Take 1 tablet (4 mg total) by mouth every 8 (eight) hours as needed for muscle spasms. 180 tablet 1   triamcinolone (KENALOG) 0.025 % ointment Apply 1 application topically 2 (two) times daily. 30 g 1   WIXELA INHUB 250-50 MCG/DOSE AEPB Inhale 1 puff into the lungs 2 (two) times  daily.     No current facility-administered medications on file prior to visit.    There are no Patient Instructions on file for this visit. No follow-ups on file.   Kris Hartmann, NP

## 2020-11-23 ENCOUNTER — Telehealth: Payer: Medicare Other

## 2020-11-23 ENCOUNTER — Telehealth: Payer: Self-pay

## 2020-11-23 NOTE — Telephone Encounter (Signed)
  Chronic Care Management   Outreach Note   Name: COULTON SCHLINK MRN: [id number] DOB: 1955/04/02  Referred by: [name], NP Reason for referral: Telephone Appointment with Clinical Pharmacist, Dahlia Byes.   An unsuccessful telephone outreach was attempted today. The patient was referred to the pharmacist for assistance with care management and care coordination.    Telephone appointment with clinical pharmacist today (11/23/2020) at 10am. If patient returns call immediately transfer to [phone].   Dahlia Byes, Pharm.D., BCGP Clinical Pharmacist [facility]  [phone]

## 2020-11-28 ENCOUNTER — Other Ambulatory Visit: Payer: Self-pay | Admitting: Nurse Practitioner

## 2020-11-28 NOTE — Telephone Encounter (Signed)
Requested Prescriptions  Pending Prescriptions Disp Refills  . meloxicam (MOBIC) 7.5 MG tablet [Pharmacy Med Name: MELOXICAM 7.5MG  TABLETS] 90 tablet 0    Sig: TAKE 1 TABLET(7.5 MG) BY MOUTH DAILY     Analgesics:  COX2 Inhibitors Failed - 11/28/2020 10:32 AM      Failed - HGB in normal range and within 360 days    Hemoglobin  Date Value Ref Range Status  02/24/2020 19.2 (H) 13.0 - 17.7 g/dL Final         Failed - Cr in normal range and within 360 days    Creatinine, Ser  Date Value Ref Range Status  10/29/2020 1.39 (H) 0.76 - 1.27 mg/dL Final         Passed - Patient is not pregnant      Passed - Valid encounter within last 12 months    Recent Outpatient Visits          1 month ago Hypertension associated with type 2 diabetes mellitus (HCC)   [facility] [name], NP   4 months ago Hypertension associated with type 2 diabetes mellitus (HCC)   [facility] [name], NP   4 months ago Type 2 diabetes mellitus with hyperglycemia, with long-term current use of insulin (HCC)   [facility] [place], Megan P, DO   7 months ago Coronary artery disease of native artery of native heart with stable angina pectoris (HCC)   [facility] [name], NP   8 months ago Type 2 diabetes mellitus with hyperglycemia, with long-term current use of insulin (HCC)   [facility] Marjie Skiff, NP      Future Appointments            In 2 months [name], NP [facility]   In 2 months Deirdre Evener, MD [facility]   In 9 months Stoioff, Verna Czech, MD [facility]   In 11 months  [facility]

## 2020-11-30 DIAGNOSIS — F3341 Major depressive disorder, recurrent, in partial remission: Secondary | ICD-10-CM

## 2020-11-30 DIAGNOSIS — E1159 Type 2 diabetes mellitus with other circulatory complications: Secondary | ICD-10-CM

## 2020-11-30 DIAGNOSIS — F4321 Adjustment disorder with depressed mood: Secondary | ICD-10-CM | POA: Diagnosis not present

## 2020-11-30 DIAGNOSIS — E1169 Type 2 diabetes mellitus with other specified complication: Secondary | ICD-10-CM | POA: Diagnosis not present

## 2020-11-30 DIAGNOSIS — I152 Hypertension secondary to endocrine disorders: Secondary | ICD-10-CM

## 2020-11-30 DIAGNOSIS — E785 Hyperlipidemia, unspecified: Secondary | ICD-10-CM

## 2020-11-30 DIAGNOSIS — I25118 Atherosclerotic heart disease of native coronary artery with other forms of angina pectoris: Secondary | ICD-10-CM

## 2020-11-30 DIAGNOSIS — Z794 Long term (current) use of insulin: Secondary | ICD-10-CM

## 2020-11-30 DIAGNOSIS — E1165 Type 2 diabetes mellitus with hyperglycemia: Secondary | ICD-10-CM

## 2020-11-30 DIAGNOSIS — E782 Mixed hyperlipidemia: Secondary | ICD-10-CM | POA: Diagnosis not present

## 2020-12-07 ENCOUNTER — Ambulatory Visit (INDEPENDENT_AMBULATORY_CARE_PROVIDER_SITE_OTHER): Payer: Medicare Other

## 2020-12-07 ENCOUNTER — Encounter (INDEPENDENT_AMBULATORY_CARE_PROVIDER_SITE_OTHER): Payer: Self-pay | Admitting: Vascular Surgery

## 2020-12-07 ENCOUNTER — Ambulatory Visit (INDEPENDENT_AMBULATORY_CARE_PROVIDER_SITE_OTHER): Payer: Medicare Other | Admitting: Vascular Surgery

## 2020-12-07 ENCOUNTER — Other Ambulatory Visit: Payer: Self-pay

## 2020-12-07 VITALS — BP 106/61 | HR 80 | Ht 68.0 in | Wt 218.0 lb

## 2020-12-07 DIAGNOSIS — E1159 Type 2 diabetes mellitus with other circulatory complications: Secondary | ICD-10-CM

## 2020-12-07 DIAGNOSIS — M79605 Pain in left leg: Secondary | ICD-10-CM | POA: Diagnosis not present

## 2020-12-07 DIAGNOSIS — I25118 Atherosclerotic heart disease of native coronary artery with other forms of angina pectoris: Secondary | ICD-10-CM

## 2020-12-07 DIAGNOSIS — M79604 Pain in right leg: Secondary | ICD-10-CM | POA: Diagnosis not present

## 2020-12-07 DIAGNOSIS — I890 Lymphedema, not elsewhere classified: Secondary | ICD-10-CM | POA: Diagnosis not present

## 2020-12-07 DIAGNOSIS — Z794 Long term (current) use of insulin: Secondary | ICD-10-CM

## 2020-12-07 DIAGNOSIS — I872 Venous insufficiency (chronic) (peripheral): Secondary | ICD-10-CM | POA: Diagnosis not present

## 2020-12-07 DIAGNOSIS — I152 Hypertension secondary to endocrine disorders: Secondary | ICD-10-CM

## 2020-12-07 DIAGNOSIS — E1165 Type 2 diabetes mellitus with hyperglycemia: Secondary | ICD-10-CM

## 2020-12-07 NOTE — Progress Notes (Signed)
MRN : [id number]  Corey [name] is a 65 y.o. (03-05-1955) male who presents with chief complaint of leg swelling.  History of Present Illness:   The patient returns to the office for followup evaluation regarding leg swelling.  The swelling has persisted and the pain associated with swelling continues.  For example, he notes this weekend while he was standing in the backyard trying to rake leaves his legs were very painful.  There have not been any interval development of a ulcerations or wounds.  Since the previous visit the patient has been wearing graduated compression stockings and has noted little if any improvement in the lymphedema. The patient has been using compression routinely morning until night.  The patient also states elevation during the day and exercise is being done too.  ABIs performed today are normal bilaterally with triphasic posterior tibial and dorsalis pedis waveforms bilaterally.  Current Meds  Medication Sig   Accu-Chek FastClix Lancets MISC USE TO CHECK BLOOD SUGAR AS DIRECTED   acetaminophen (TYLENOL) 500 MG tablet Take 1,000 mg by mouth every 6 (six) hours as needed for moderate pain or headache.   albuterol (PROVENTIL) (2.5 MG/3ML) 0.083% nebulizer solution USE 1 VIAL IN NEBULIZER EVERY 6 HOURS - and as needed.   albuterol (VENTOLIN HFA) 108 (90 Base) MCG/ACT inhaler INHALE 2 PUFFS INTO THE LUNGS EVERY 6 HOURS AS NEEDED FOR WHEEZING OR SHORTNESS OF BREATH (Patient taking differently: Inhale 2 puffs into the lungs every 6 (six) hours as needed for wheezing or shortness of breath.)   amitriptyline (ELAVIL) 50 MG tablet TAKE 1 TO 2 TABLETS(50 TO 100 MG) BY MOUTH AT BEDTIME   aspirin EC 81 MG tablet Take 81 mg by mouth daily.   azelastine (ASTELIN) 0.1 % nasal spray USE 2 SPRAYS IN EACH NOSTRIL TWICE DAILY (Patient taking differently: Place 2 sprays into both nostrils 2 (two) times daily.)   B-D UF III MINI PEN NEEDLES 31G X 5 MM MISC USE TWICE DAILY   Blood  Glucose Monitoring Suppl (ONETOUCH VERIO) w/Device KIT Use to check blood sugar 2-3 times daily and document for provider visits.   buPROPion (WELLBUTRIN XL) 150 MG 24 hr tablet TAKE 1 TABLET(150 MG) BY MOUTH DAILY   cetirizine (ZYRTEC) 10 MG tablet TAKE 1 TABLET BY MOUTH DAILY   Cholecalciferol (VITAMIN D3) 5000 units TABS Take 5,000 Units by mouth daily.    ciclopirox (PENLAC) 8 % solution Apply topically at bedtime. Apply over nail and surrounding skin. Apply daily over previous coat. After seven (7) days, may remove with alcohol and continue cycle.   citalopram (CELEXA) 10 MG tablet TAKE 1 TABLET BY MOUTH  DAILY   clopidogrel (PLAVIX) 75 MG tablet Take 75 mg by mouth daily.   diclofenac sodium (VOLTAREN) 1 % GEL Apply 1 application topically 4 (four) times daily as needed (pain).    famotidine (PEPCID) 40 MG tablet TAKE 1 TABLET(40 MG) BY MOUTH DAILY   fenofibrate (TRICOR) 145 MG tablet TAKE 1 TABLET BY MOUTH  DAILY   furosemide (LASIX) 20 MG tablet TAKE 1 TABLET(20 MG) BY MOUTH TWICE DAILY   glucose blood test strip Use to check blood sugar 2-3 times daily and document for provider visits.   isosorbide mononitrate (IMDUR) 60 MG 24 hr tablet Take 60 mg by mouth daily.   JARDIANCE 25 MG TABS tablet Take 1 tablet (25 mg total) by mouth daily.   ketoconazole (NIZORAL) 2 % cream Apply to corners of mouth at bedtime  ketorolac (TORADOL) 10 MG tablet Take 1 tablet (10 mg total) by mouth every 6 (six) hours as needed.   Lancets Misc. (ACCU-CHEK FASTCLIX LANCET) KIT 1 Units by Does not apply route 2 (two) times daily.   LANTUS SOLOSTAR 100 UNIT/ML Solostar Pen Inject 85 Units into the skin at bedtime. (Patient taking differently: Inject 70 Units into the skin at bedtime.)   losartan (COZAAR) 50 MG tablet Take 1 tablet (50 mg total) by mouth daily.   magnesium gluconate (MAGONATE) 500 MG tablet Take 500 mg by mouth daily.   meloxicam (MOBIC) 7.5 MG tablet TAKE 1 TABLET(7.5 MG) BY MOUTH DAILY    metFORMIN (GLUCOPHAGE) 1000 MG tablet TAKE 1 TABLET BY MOUTH  TWICE DAILY   metoprolol succinate (TOPROL-XL) 25 MG 24 hr tablet Take 25 mg by mouth daily.   montelukast (SINGULAIR) 10 MG tablet TAKE 1 TABLET(10 MG) BY MOUTH DAILY (Patient taking differently: Take 10 mg by mouth at bedtime.)   NEEDLE, DISP, 18 G 18G X 1" MISC Use as directed   nitroGLYCERIN (NITROSTAT) 0.4 MG SL tablet Place under the tongue.   pantoprazole (PROTONIX) 40 MG tablet Take 1 tablet (40 mg total) by mouth 2 (two) times daily before a meal.   pregabalin (LYRICA) 150 MG capsule Take 1 capsule (150 mg total) by mouth 2 (two) times daily.   primidone (MYSOLINE) 50 MG tablet TAKE 2 TABLETS(100 MG) BY MOUTH DAILY   rosuvastatin (CRESTOR) 20 MG tablet TAKE 1 TABLET BY MOUTH  DAILY   Semaglutide, 1 MG/DOSE, 4 MG/3ML SOPN Inject 1 mg into the skin once a week.   SYRINGE-NEEDLE, DISP, 3 ML (LUER LOCK SAFETY SYRINGES) 21G X 1-1/2" 3 ML MISC Use as directed for testosterone administration   testosterone cypionate (DEPOTESTOSTERONE CYPIONATE) 200 MG/ML injection Inject 1.5 mLs (300 mg total) into the muscle every 14 (fourteen) days.   tiotropium (SPIRIVA) 18 MCG inhalation capsule Place into inhaler and inhale.   tiZANidine (ZANAFLEX) 4 MG tablet Take 1 tablet (4 mg total) by mouth every 8 (eight) hours as needed for muscle spasms.   triamcinolone (KENALOG) 0.025 % ointment Apply 1 application topically 2 (two) times daily.   WIXELA INHUB 250-50 MCG/DOSE AEPB Inhale 1 puff into the lungs 2 (two) times daily.    Past Medical History:  Diagnosis Date   Asthma    Diabetes ([place])    GERD (gastroesophageal reflux disease)    Hyperlipidemia    Hypertension    Legg-Perthes disease     Past Surgical History:  Procedure Laterality Date   CARDIAC CATHETERIZATION     COLONOSCOPY WITH PROPOFOL N/A 04/16/2019   Procedure: COLONOSCOPY WITH PROPOFOL;  Surgeon: Jonathon Bellows, MD;  Location: [facility] ENDOSCOPY;  Service: Gastroenterology;   Laterality: N/A;   COLONOSCOPY WITH PROPOFOL N/A 06/14/2019   Procedure: COLONOSCOPY WITH PROPOFOL;  Surgeon: Jonathon Bellows, MD;  Location: [facility] ENDOSCOPY;  Service: Gastroenterology;  Laterality: N/A;   COLONOSCOPY WITH PROPOFOL N/A 07/30/2019   Procedure: COLONOSCOPY WITH PROPOFOL;  Surgeon: Jonathon Bellows, MD;  Location: [facility] ENDOSCOPY;  Service: Gastroenterology;  Laterality: N/A;   ESOPHAGOGASTRODUODENOSCOPY (EGD) WITH PROPOFOL N/A 04/16/2019   Procedure: ESOPHAGOGASTRODUODENOSCOPY (EGD) WITH PROPOFOL;  Surgeon: Jonathon Bellows, MD;  Location: [facility] ENDOSCOPY;  Service: Gastroenterology;  Laterality: N/A;   Foot Surgery     HIP SURGERY     RIGHT/LEFT HEART CATH AND CORONARY ANGIOGRAPHY N/A 07/25/2018   Procedure: RIGHT/LEFT HEART CATH AND CORONARY ANGIOGRAPHY;  Surgeon: Yolonda Kida, MD;  Location: [place] INVASIVE CV  LAB;  Service: Cardiovascular;  Laterality: N/A;    Social History Social History   Tobacco Use   Smoking status: Former    Packs/day: 0.50    Years: 2.00    Pack years: 1.00    Types: Cigarettes    Quit date: 02/01/1980    Years since quitting: 40.8   Smokeless tobacco: Current    Types: Chew   Tobacco comments:    quit 40 years ago   Vaping Use   Vaping Use: Never used  Substance Use Topics   Alcohol use: Not Currently   Drug use: Never    Family History Family History  Problem Relation Age of Onset   Cancer Mother    Heart disease Mother    Diabetes Father     No Known Allergies   REVIEW OF SYSTEMS (Negative unless checked)  Constitutional: [] Weight loss  [] Fever  [] Chills Cardiac: [] Chest pain   [] Chest pressure   [] Palpitations   [] Shortness of breath when laying flat   [] Shortness of breath with exertion. Vascular:  [] Pain in legs with walking   [x] Pain in legs at rest  [] History of DVT   [] Phlebitis   [x] Swelling in legs   [] Varicose veins   [] Non-healing ulcers Pulmonary:   [] Uses home oxygen   [] Productive cough   [] Hemoptysis   [] Wheeze  [] COPD    [] Asthma Neurologic:  [] Dizziness   [] Seizures   [] History of stroke   [] History of TIA  [] Aphasia   [] Vissual changes   [] Weakness or numbness in arm   [] Weakness or numbness in leg Musculoskeletal:   [] Joint swelling   [x] Joint pain   [] Low back pain Hematologic:  [] Easy bruising  [] Easy bleeding   [] Hypercoagulable state   [] Anemic Gastrointestinal:  [] Diarrhea   [] Vomiting  [] Gastroesophageal reflux/heartburn   [] Difficulty swallowing. Genitourinary:  [] Chronic kidney disease   [] Difficult urination  [] Frequent urination   [] Blood in urine Skin:  [] Rashes   [] Ulcers  Psychological:  [] History of anxiety   []  History of major depression.  Physical Examination  Vitals:   12/07/20 0822  BP: 106/61  Pulse: 80  Weight: 218 lb (98.9 kg)  Height: 5' 8"  (1.727 m)   Body mass index is 33.15 kg/m. Gen: WD/WN, NAD Head: [place]/AT, No temporalis wasting.  Ear/Nose/Throat: Hearing grossly intact, nares w/o erythema or drainage, pinna without lesions Eyes: PER, EOMI, sclera nonicteric.  Neck: Supple, no gross masses.  No JVD.  Pulmonary:  Good air movement, no audible wheezing, no use of accessory muscles.  Cardiac: RRR, precordium not hyperdynamic. Vascular:  scattered varicosities present bilaterally.  Moderate to severe venous stasis changes to the legs bilaterally.  2+ hard non-pitting edema  Vessel Right Left  Radial Palpable Palpable  Gastrointestinal: soft, non-distended. No guarding/no peritoneal signs.  Musculoskeletal: M/S 5/5 throughout.  No deformity.  Neurologic: CN 2-12 intact. Pain and light touch intact in extremities.  Symmetrical.  Speech is fluent. Motor exam as listed above. Psychiatric: Judgment intact, Mood & affect appropriate for pt's clinical situation. Dermatologic: Severe venous rashes no ulcers noted.  No changes consistent with cellulitis. Lymph : No lichenification or skin changes of chronic lymphedema.  CBC Lab Results  Component Value Date   WBC 10.8  02/24/2020   HGB 19.2 (H) 02/24/2020   HCT 46.8 08/12/2020   MCV 94 02/24/2020   PLT 193 02/24/2020    BMET    Component Value Date/Time   NA 142 10/29/2020 1454   K 4.6 10/29/2020 1454  CL 101 10/29/2020 1454   CO2 23 10/29/2020 1454   GLUCOSE 150 (H) 10/29/2020 1454   GLUCOSE 212 (H) 01/14/2020 0448   BUN 19 10/29/2020 1454   CREATININE 1.39 (H) 10/29/2020 1454   CALCIUM 10.0 10/29/2020 1454   GFRNONAA 55 (L) 02/24/2020 1106   GFRNONAA >60 01/14/2020 0448   GFRAA 63 02/24/2020 1106   CrCl cannot be calculated (Patient's most recent lab result is older than the maximum 21 days allowed.).  COAG No results found for: INR, PROTIME  Radiology VAS [place] LOWER EXTREMITY VENOUS REFLUX  Result Date: 11/26/2020  Lower Venous Reflux Study Patient Name:  OBALOLUWA DELATTE  Date of Exam:   11/11/2020 Medical Rec #: [id number]         Accession #:    [id number] Date of Birth: 1955-09-05         Patient Gender: M Patient Age:   65 years Exam Location:  [facility] Procedure:      VAS [place] LOWER EXTREMITY VENOUS REFLUX Referring Phys: Hortencia Pilar --------------------------------------------------------------------------------  Indications: Swelling.  Performing Technologist: Concha Norway RVT  Examination Guidelines: A complete evaluation includes B-mode imaging, spectral Doppler, color Doppler, and power Doppler as needed of all accessible portions of each vessel. Bilateral testing is considered an integral part of a complete examination. Limited examinations for reoccurring indications may be performed as noted. The reflux portion of the exam is performed with the patient in reverse Trendelenburg. Significant venous reflux is defined as >500 ms in the superficial venous system, and >1 second in the deep venous system.   Summary: Bilateral: - No evidence of deep vein thrombosis seen in the lower extremities, bilaterally, from the common femoral through the popliteal veins. - No evidence  of superficial venous thrombosis in the lower extremities, bilaterally. - No evidence of deep venous insufficiency seen bilaterally in the lower extremity. - No evidence of superficial venous reflux seen in the greater saphenous veins bilaterally. - No evidence of superficial venous reflux seen in the short saphenous veins bilaterally.  *[name] table(s) above for measurements and observations. Electronically signed by Hortencia Pilar MD on 11/26/2020 at 4:39:53 PM.    Final      Assessment/Plan 1. Lymphedema Recommend:  No surgery or intervention at this point in time.    I have reviewed my previous discussion with the patient regarding swelling and why it causes symptoms.  Patient will continue wearing graduated compression stockings class 1 (20-30 mmHg) on a daily basis. The patient will  beginning wearing the stockings first thing in the morning and removing them in the evening. The patient is instructed specifically not to sleep in the stockings.    In addition, behavioral modification including several periods of elevation of the lower extremities during the day will be continued.  This was reviewed with the patient during the initial visit.  The patient will also continue routine exercise, especially walking on a daily basis as was discussed during the initial visit.    Despite conservative treatments including graduated compression therapy class 1 and behavioral modification including exercise and elevation the patient  has not obtained adequate control of the lymphedema.  The patient still has stage 3 lymphedema and therefore, I believe that a lymph pump should be added to improve the control of the patient's lymphedema.  Additionally, a lymph pump is warranted because it will reduce the risk of cellulitis and ulceration in the future.  Patient should follow-up in six months    2.  Chronic venous insufficiency Recommend:  No surgery or intervention at this point in time.    I have  reviewed my previous discussion with the patient regarding swelling and why it causes symptoms.  Patient will continue wearing graduated compression stockings class 1 (20-30 mmHg) on a daily basis. The patient will  beginning wearing the stockings first thing in the morning and removing them in the evening. The patient is instructed specifically not to sleep in the stockings.    In addition, behavioral modification including several periods of elevation of the lower extremities during the day will be continued.  This was reviewed with the patient during the initial visit.  The patient will also continue routine exercise, especially walking on a daily basis as was discussed during the initial visit.    Despite conservative treatments including graduated compression therapy class 1 and behavioral modification including exercise and elevation the patient  has not obtained adequate control of the lymphedema.  The patient still has stage 3 lymphedema and therefore, I believe that a lymph pump should be added to improve the control of the patient's lymphedema.  Additionally, a lymph pump is warranted because it will reduce the risk of cellulitis and ulceration in the future.  Patient should follow-up in six months    3. Coronary artery disease of native artery of native heart with stable angina pectoris (HCC) Continue cardiac and antihypertensive medications as already ordered and reviewed, no changes at this time.  Continue statin as ordered and reviewed, no changes at this time  Nitrates PRN for chest pain   4. Hypertension associated with type 2 diabetes mellitus ([place]) Continue antihypertensive medications as already ordered, these medications have been reviewed and there are no changes at this time.   5. Type 2 diabetes mellitus with hyperglycemia, with long-term current use of insulin (HCC) Continue hypoglycemic medications as already ordered, these medications have been reviewed and there are no  changes at this time.  Hgb A1C to be monitored as already arranged by primary service    Hortencia Pilar, MD  12/07/2020 8:47 AM

## 2020-12-12 ENCOUNTER — Other Ambulatory Visit: Payer: Self-pay | Admitting: Nurse Practitioner

## 2020-12-12 DIAGNOSIS — F3341 Major depressive disorder, recurrent, in partial remission: Secondary | ICD-10-CM

## 2020-12-12 NOTE — Telephone Encounter (Signed)
Requested Prescriptions  Pending Prescriptions Disp Refills  . citalopram (CELEXA) 10 MG tablet [Pharmacy Med Name: Citalopram Hydrobromide 10 MG Oral Tablet] 90 tablet 1    Sig: TAKE 1 TABLET BY MOUTH  DAILY     Psychiatry:  Antidepressants - SSRI Passed - 12/12/2020  5:23 AM      Passed - Completed PHQ-2 or PHQ-9 in the last 360 days      Passed - Valid encounter within last 6 months    Recent Outpatient Visits          1 month ago Hypertension associated with type 2 diabetes mellitus (HCC)   [facility] [name], NP   4 months ago Hypertension associated with type 2 diabetes mellitus (HCC)   [facility] [name], NP   4 months ago Type 2 diabetes mellitus with hyperglycemia, with long-term current use of insulin (HCC)   [facility] [place], Megan P, DO   7 months ago Coronary artery disease of native artery of native heart with stable angina pectoris (HCC)   [facility] [name], NP   9 months ago Type 2 diabetes mellitus with hyperglycemia, with long-term current use of insulin (HCC)   [facility] [place], Dorie Rank, NP      Future Appointments            In 1 month [name], NP [facility]   In 2 months Deirdre Evener, MD [facility]   In 8 months Stoioff, Verna Czech, MD [facility]   In 11 months  [facility]

## 2020-12-17 ENCOUNTER — Ambulatory Visit (INDEPENDENT_AMBULATORY_CARE_PROVIDER_SITE_OTHER): Payer: Medicare Other | Admitting: Podiatry

## 2020-12-17 ENCOUNTER — Encounter: Payer: Self-pay | Admitting: Podiatry

## 2020-12-17 ENCOUNTER — Other Ambulatory Visit: Payer: Self-pay

## 2020-12-17 DIAGNOSIS — E1159 Type 2 diabetes mellitus with other circulatory complications: Secondary | ICD-10-CM

## 2020-12-17 DIAGNOSIS — M79675 Pain in left toe(s): Secondary | ICD-10-CM

## 2020-12-17 DIAGNOSIS — M79674 Pain in right toe(s): Secondary | ICD-10-CM | POA: Diagnosis not present

## 2020-12-17 DIAGNOSIS — B351 Tinea unguium: Secondary | ICD-10-CM

## 2020-12-17 NOTE — Progress Notes (Signed)
  Subjective:  Patient ID: Corey [name], male    DOB: 1955/06/05,  MRN: [id number]  Chief Complaint  Patient presents with   Nail Problem    Nail trim     65 y.o. male returns for the above complaint.  Patient presents with thickened elongated dystrophic toenails x10.  Mild pain on palpation.  Patient would like to have them debrided down.  There is mycotic nature to it.  Patient is a diabetic with last A1c of 7%.  He has secondary complaint of treating the nail fungus.  He has not tried much for it.  He would like to try topical application he does not want to take an oral medication.  He is a diabetic with uncontrolled A1c  Objective:  There were no vitals filed for this visit. Podiatric Exam: Vascular: dorsalis pedis and posterior tibial pulses are palpable bilateral. Capillary return is immediate. Temperature gradient is WNL. Skin turgor WNL  Sensorium: Decreased Semmes Weinstein monofilament test.  Decreased tactile sensation bilaterally. Nail Exam: Pt has thick disfigured discolored nails with subungual debris noted bilateral entire nail hallux through fifth toenails.  Pain on palpation to the nails. Ulcer Exam: There is no evidence of ulcer or pre-ulcerative changes or infection. Orthopedic Exam: Muscle tone and strength are WNL. No limitations in general ROM. No crepitus or effusions noted. HAV  B/L.  Hammer toes 2-5  B/L.  Pain with range of motion of bilateral ankle joint deep intra-articular pain noted.  No crepitus could be palpated.  No pain at the posterior tibial tendon, peroneal tendon, Achilles tendon. Skin: No Porokeratosis. No infection or ulcers    Assessment & Plan:   1. Type 2 diabetes mellitus with vascular disease (HCC)   2. Pain due to onychomycosis of toenails of both feet       Patient was evaluated and treated and all questions answered.  Bilateral ankle capsulitis -I clinically healing   Hammertoe contractures 2 through 5 bilaterally -I explained  to patient the etiology of contracture as well as treatment options were discussed.  Given that he is a uncontrolled diabetic with A1c of 10% he will benefit from diabetic shoes.  He will be scheduled to see EJ for diabetic shoes.  Onychomycosis with pain with underlying nail dystrophy -Nails palliatively debrided as below. -Educated on self-care  Procedure: Nail Debridement Rationale: pain  Type of Debridement: manual, sharp debridement. Instrumentation: Nail nipper, rotary burr. Number of Nails: 10 -Penlac was dispensed to help with nail dystrophy  Procedures and Treatment: Consent by patient was obtained for treatment procedures. The patient understood the discussion of treatment and procedures well. All questions were answered thoroughly reviewed. Debridement of mycotic and hypertrophic toenails, 1 through 5 bilateral and clearing of subungual debris. No ulceration, no infection noted.  Return Visit-Office Procedure: Patient instructed to return to the office for a follow up visit 3 months for continued evaluation and treatment.  Nicholes Rough, DPM    No follow-ups on file.

## 2020-12-18 ENCOUNTER — Telehealth: Payer: Self-pay | Admitting: Nurse Practitioner

## 2020-12-18 NOTE — Telephone Encounter (Signed)
Copied from CRM [id number]. Topic: Referral - Request for Referral >> Dec 18, 2020  2:06 PM Wyonia Hough E wrote: Has patient seen PCP for this complaint? Yes  *If NO, is insurance requiring patient see PCP for this issue before PCP can refer them? Referral for which specialty: dentistry  Preferred provider/office:  Reason for referral: pt had dentures made but they do not fit and pt wants to know if Clydie Braun can refer him to another location/ pt needs help with the fit or new dentures /pt stated the placed that fixed them is not helping him / [facility] is the one that did the original dentures and pt does not want to go back to them since they have not helped much

## 2020-12-22 NOTE — Telephone Encounter (Signed)
He shouldn't need a referral for a dentist. I can put it in if he does but he needs to let us know where he is going.

## 2020-12-22 NOTE — Telephone Encounter (Signed)
Routing to provider. Can a new referral be entered please?

## 2020-12-23 ENCOUNTER — Ambulatory Visit (INDEPENDENT_AMBULATORY_CARE_PROVIDER_SITE_OTHER): Payer: Medicare Other

## 2020-12-23 DIAGNOSIS — Z794 Long term (current) use of insulin: Secondary | ICD-10-CM

## 2020-12-23 DIAGNOSIS — F3341 Major depressive disorder, recurrent, in partial remission: Secondary | ICD-10-CM

## 2020-12-23 DIAGNOSIS — E1169 Type 2 diabetes mellitus with other specified complication: Secondary | ICD-10-CM

## 2020-12-23 DIAGNOSIS — I25118 Atherosclerotic heart disease of native coronary artery with other forms of angina pectoris: Secondary | ICD-10-CM

## 2020-12-23 DIAGNOSIS — E785 Hyperlipidemia, unspecified: Secondary | ICD-10-CM

## 2020-12-23 DIAGNOSIS — G8929 Other chronic pain: Secondary | ICD-10-CM

## 2020-12-23 DIAGNOSIS — I10 Essential (primary) hypertension: Secondary | ICD-10-CM

## 2020-12-23 DIAGNOSIS — F4321 Adjustment disorder with depressed mood: Secondary | ICD-10-CM

## 2020-12-23 DIAGNOSIS — G894 Chronic pain syndrome: Secondary | ICD-10-CM

## 2020-12-23 DIAGNOSIS — E782 Mixed hyperlipidemia: Secondary | ICD-10-CM

## 2020-12-23 DIAGNOSIS — I152 Hypertension secondary to endocrine disorders: Secondary | ICD-10-CM

## 2020-12-23 DIAGNOSIS — E1165 Type 2 diabetes mellitus with hyperglycemia: Secondary | ICD-10-CM

## 2020-12-23 DIAGNOSIS — E1159 Type 2 diabetes mellitus with other circulatory complications: Secondary | ICD-10-CM

## 2020-12-23 NOTE — Patient Instructions (Signed)
Visit Information  Thank you for taking time to visit with me today. Please don't hesitate to contact me if I can be of assistance to you before our next scheduled telephone appointment.  Following are the goals we discussed today:  RNCM Clinical Goal(s):  Patient will verbalize basic understanding of CAD, HTN, HLD, DMII, Depression, and chronic pain and grief  disease process and self health management plan as evidenced by keeping appointments, following the plan of care, and working with the CCM team to effectively manage chronic conditions  take all medications exactly as prescribed and will call provider for medication related questions as evidenced by compliance with medications and calling for refills before running out of medications     attend all scheduled medical appointments: 01-29-2021 as evidenced by keeping appointments and calling the office for needed schedule changes        demonstrate improved and ongoing adherence to prescribed treatment plan for CAD, HTN, HLD, DMII, Depression, and grief and chronic pain as evidenced by stable conditions, normalized blood sugars, no exacerbations in depression or grief demonstrate a decrease in CAD, HTN, HLD, DMII, Depression, and grief and chronic pain  exacerbations  as evidenced by working with the pcp and CCM team to effectively manage chronic conditions and optimize health and well being  demonstrate ongoing self health care management ability for effective management of chronic conditions  as evidenced by  working with the CCM team through collaboration with Consulting civil engineer, provider, and care team.    Interventions: 1:1 collaboration with primary care provider regarding development and update of comprehensive plan of care as evidenced by provider attestation and co-signature Inter-disciplinary care team collaboration (see longitudinal plan of care) Evaluation of current treatment plan related to  self management and patient's adherence to  plan as established by provider Patient called to give the [facility] a new phone number to reach him: [phone]     CAD  (Status: Goal on Track (progressing): YES.) Long Term Goal  Assessed understanding of CAD diagnosis Medications reviewed including medications utilized in CAD treatment plan Provided education on importance of blood pressure control in management of CAD; Provided education on Importance of limiting foods high in cholesterol; Counseled on importance of regular laboratory monitoring as prescribed; Counseled on the importance of exercise goals with target of 150 minutes per week Reviewed Importance of taking all medications as prescribed Reviewed Importance of attending all scheduled provider appointments Advised to report any changes in symptoms or exercise tolerance   Diabetes:  (Status: Goal on Track (progressing): YES.) Long Term Goal         Lab Results  Component Value Date    HGBA1C 7.5 (H) 10/29/2020  Assessed patient's understanding of A1c goal: <7% Provided education to patient about basic DM disease process; Reviewed medications with patient and discussed importance of medication adherence;        Reviewed prescribed diet with patient heart healthy/ADA; Counseled on importance of regular laboratory monitoring as prescribed;        Discussed plans with patient for ongoing care management follow up and provided patient with direct contact information for care management team;      Provided patient with written educational materials related to hypo and hyperglycemia and importance of correct treatment;       Reviewed scheduled/upcoming provider appointments including: 01-29-2021 and specialist as directed ;         Advised patient, providing education and rationale, to check cbg before meals and at bedtime, when  you have symptoms of low or high blood sugar, and before and after exercise and record        call provider for findings outside established parameters;        Review of patient status, including review of consultants reports, relevant laboratory and other test results, and medications completed;         Depression and Grief  (Status: Goal on Track (progressing): YES.) Long Term Goal  Evaluation of current treatment plan related to Depression and grief , Limited social support, Mental Health Concerns , and Social Isolation self-management and patient's adherence to plan as established by provider. Discussed plans with patient for ongoing care management follow up and provided patient with direct contact information for care management team Advised patient to call the office for changes in mood, anxiety, depression, or inability to effectively manage the grief process over the recent death of his wife; Provided education to patient re: resources available to assist with mental health needs and the support of the CCM team to help him work through changes in his mental health/grief process; Reviewed medications with patient and discussed compliance ; Reviewed scheduled/upcoming provider appointments including 01-29-2021; Discussed plans with patient for ongoing care management follow up and provided patient with direct contact information for care management team; Advised patient to discuss changes in mental health needs, questions, or concerns with provider; Screening for signs and symptoms of depression related to chronic disease state;  Assessed social determinant of health barriers;    Hyperlipidemia:  (Status: Goal on Track (progressing): YES.) Long Term Goal       Lab Results  Component Value Date    CHOL 156 10/29/2020    HDL 38 (L) 10/29/2020    LDLCALC 87 10/29/2020    TRIG 181 (H) 10/29/2020    CHOLHDL 4.1 10/29/2020      Medication review performed; medication list updated in electronic medical record.  Provider established cholesterol goals reviewed; Counseled on importance of regular laboratory monitoring as prescribed; Provided  HLD educational materials; Reviewed role and benefits of statin for ASCVD risk reduction; Discussed strategies to manage statin-induced myalgias; Reviewed importance of limiting foods high in cholesterol;   Hypertension: (Status: Goal on Track (progressing): YES.) Last practice recorded BP readings:     BP Readings from Last 3 Encounters:  12/07/20 106/61  11/11/20 104/60  11/02/20 101/61  Most recent eGFR/CrCl:       Lab Results  Component Value Date    EGFR 57 (L) 10/29/2020    No components found for: CRCL   Evaluation of current treatment plan related to hypertension self management and patient's adherence to plan as established by provider;   Provided education to patient re: stroke prevention, s/s of heart attack and stroke; Reviewed prescribed diet heart healthy/ADA Reviewed medications with patient and discussed importance of compliance;  Discussed plans with patient for ongoing care management follow up and provided patient with direct contact information for care management team; Advised patient, providing education and rationale, to monitor blood pressure daily and record, calling PCP for findings outside established parameters;  Provided education on prescribed diet heart healthy/ADA ;  Discussed complications of poorly controlled blood pressure such as heart disease, stroke, circulatory complications, vision complications, kidney impairment, sexual dysfunction;    Pain:  (Status: Goal on Track (progressing): YES.) Long Term Goal  Pain assessment performed Medications reviewed Reviewed provider established plan for pain management; Discussed importance of adherence to all scheduled medical appointments; Counseled on the importance of reporting  any/all new or changed pain symptoms or management strategies to pain management provider; Advised patient to report to care team affect of pain on daily activities; Discussed use of relaxation techniques and/or diversional  activities to assist with pain reduction (distraction, imagery, relaxation, massage, acupressure, TENS, heat, and cold application; Reviewed with patient prescribed pharmacological and nonpharmacological pain relief strategies; Advised patient to discuss Unresolved pain, changes in level or intensity of pain  with provider;   Patient Goals/Self-Care Activities: Take medications as prescribed   Attend all scheduled provider appointments Call pharmacy for medication refills 3-7 days in advance of running out of medications Attend church or other social activities Perform all self care activities independently  Perform IADL's (shopping, preparing meals, housekeeping, managing finances) independently Call provider office for new concerns or questions  Work with the social worker to address care coordination needs and will continue to work with the clinical team to address health care and disease management related needs call the Suicide and Crisis Lifeline: 988 call the [place] National Suicide Prevention Lifeline: 249-591-8629 or TTY: (787)227-1371 TTY 346-884-7744) to talk to a trained counselor call 1-800-273-TALK (toll free, 24 hour hotline) if experiencing a Mental Health or Center  schedule appointment with eye doctor check blood sugar at prescribed times: before meals and at bedtime, when you have symptoms of low or high blood sugar, and before and after exercise check feet daily for cuts, sores or redness enter blood sugar readings and medication or insulin into daily log take the blood sugar log to all doctor visits trim toenails straight across drink 6 to 8 glasses of water each day eat fish at least once per week fill half of plate with vegetables limit fast food meals to no more than 1 per week manage portion size prepare main meal at home 3 to 5 days each week read food labels for fat, fiber, carbohydrates and portion size keep feet up while sitting wash and  dry feet carefully every day wear comfortable, cotton socks wear comfortable, well-fitting shoes check blood pressure weekly choose a place to take my blood pressure (home, clinic or office, retail store) write blood pressure results in a log or diary learn about high blood pressure keep a blood pressure log take blood pressure log to all doctor appointments call doctor for signs and symptoms of high blood pressure develop an action plan for high blood pressure keep all doctor appointments take medications for blood pressure exactly as prescribed report new symptoms to your doctor eat more whole grains, fruits and vegetables, lean meats and healthy fats - call for medicine refill 2 or 3 days before it runs out - take all medications exactly as prescribed - call doctor with any symptoms you believe are related to your medicine - call doctor when you experience any new symptoms - go to all doctor appointments as scheduled - adhere to prescribed diet: Heart healthy/ADA  Our next appointment is by telephone on 01-12-2021 at 9 am  Please call the care guide team at [phone] if you need to cancel or reschedule your appointment.   Please call the Suicide and Crisis Lifeline: 988 call the [place] National Suicide Prevention Lifeline: 5051520427 or TTY: (562)238-3747 TTY (469) 590-3010) to talk to a trained counselor call 1-800-273-TALK (toll free, 24 hour hotline) if you are experiencing a Mental Health or [place] or need someone to talk to.  Patient verbalizes understanding of instructions provided today and agrees to view in [place].   Noreene Larsson  RN, MSN, [facility] Mobile: [phone]

## 2020-12-23 NOTE — Chronic Care Management (AMB) (Signed)
Chronic Care Management   CCM RN Visit Note  12/23/2020 Name: Corey [name] MRN: [id number] DOB: 1955-05-04  Subjective: Corey [name] is a 65 y.o. year old male who is a primary care patient of Jon Billings, NP. The care management team was consulted for assistance with disease management and care coordination needs.    Engaged with patient by telephone for follow up visit in response to provider referral for case management and/or care coordination services.   Consent to Services:  The patient was given information about Chronic Care Management services, agreed to services, and gave verbal consent prior to initiation of services.  Please see initial visit note for detailed documentation.   Patient agreed to services and verbal consent obtained.   Assessment: Review of patient past medical history, allergies, medications, health status, including review of consultants reports, laboratory and other test data, was performed as part of comprehensive evaluation and provision of chronic care management services.   SDOH (Social Determinants of Health) assessments and interventions performed:    CCM Care Plan  No Known Allergies  Outpatient Encounter Medications as of 12/23/2020  Medication Sig   Accu-Chek FastClix Lancets MISC USE TO CHECK BLOOD SUGAR AS DIRECTED   acetaminophen (TYLENOL) 500 MG tablet Take 1,000 mg by mouth every 6 (six) hours as needed for moderate pain or headache.   albuterol (PROVENTIL) (2.5 MG/3ML) 0.083% nebulizer solution USE 1 VIAL IN NEBULIZER EVERY 6 HOURS - and as needed.   albuterol (VENTOLIN HFA) 108 (90 Base) MCG/ACT inhaler INHALE 2 PUFFS INTO THE LUNGS EVERY 6 HOURS AS NEEDED FOR WHEEZING OR SHORTNESS OF BREATH (Patient taking differently: Inhale 2 puffs into the lungs every 6 (six) hours as needed for wheezing or shortness of breath.)   amitriptyline (ELAVIL) 50 MG tablet TAKE 1 TO 2 TABLETS(50 TO 100 MG) BY MOUTH AT BEDTIME   aspirin EC 81 MG  tablet Take 81 mg by mouth daily.   azelastine (ASTELIN) 0.1 % nasal spray USE 2 SPRAYS IN EACH NOSTRIL TWICE DAILY (Patient taking differently: Place 2 sprays into both nostrils 2 (two) times daily.)   B-D UF III MINI PEN NEEDLES 31G X 5 MM MISC USE TWICE DAILY   Blood Glucose Monitoring Suppl (ONETOUCH VERIO) w/Device KIT Use to check blood sugar 2-3 times daily and document for provider visits.   buPROPion (WELLBUTRIN XL) 150 MG 24 hr tablet TAKE 1 TABLET(150 MG) BY MOUTH DAILY   cetirizine (ZYRTEC) 10 MG tablet TAKE 1 TABLET BY MOUTH DAILY   Cholecalciferol (VITAMIN D3) 5000 units TABS Take 5,000 Units by mouth daily.    ciclopirox (PENLAC) 8 % solution Apply topically at bedtime. Apply over nail and surrounding skin. Apply daily over previous coat. After seven (7) days, may remove with alcohol and continue cycle.   citalopram (CELEXA) 10 MG tablet TAKE 1 TABLET BY MOUTH  DAILY   clopidogrel (PLAVIX) 75 MG tablet Take 75 mg by mouth daily.   diclofenac sodium (VOLTAREN) 1 % GEL Apply 1 application topically 4 (four) times daily as needed (pain).    famotidine (PEPCID) 40 MG tablet TAKE 1 TABLET(40 MG) BY MOUTH DAILY   fenofibrate (TRICOR) 145 MG tablet TAKE 1 TABLET BY MOUTH  DAILY   furosemide (LASIX) 20 MG tablet TAKE 1 TABLET(20 MG) BY MOUTH TWICE DAILY   glucose blood test strip Use to check blood sugar 2-3 times daily and document for provider visits.   isosorbide mononitrate (IMDUR) 60 MG 24 hr tablet Take 60  mg by mouth daily.   JARDIANCE 25 MG TABS tablet Take 1 tablet (25 mg total) by mouth daily.   ketoconazole (NIZORAL) 2 % cream Apply to corners of mouth at bedtime   ketorolac (TORADOL) 10 MG tablet Take 1 tablet (10 mg total) by mouth every 6 (six) hours as needed.   Lancets Misc. (ACCU-CHEK FASTCLIX LANCET) KIT 1 Units by Does not apply route 2 (two) times daily.   LANTUS SOLOSTAR 100 UNIT/ML Solostar Pen Inject 85 Units into the skin at bedtime. (Patient taking differently:  Inject 70 Units into the skin at bedtime.)   losartan (COZAAR) 50 MG tablet Take 1 tablet (50 mg total) by mouth daily.   magnesium gluconate (MAGONATE) 500 MG tablet Take 500 mg by mouth daily.   meloxicam (MOBIC) 7.5 MG tablet TAKE 1 TABLET(7.5 MG) BY MOUTH DAILY   metFORMIN (GLUCOPHAGE) 1000 MG tablet TAKE 1 TABLET BY MOUTH  TWICE DAILY   metoprolol succinate (TOPROL-XL) 25 MG 24 hr tablet Take 25 mg by mouth daily.   montelukast (SINGULAIR) 10 MG tablet TAKE 1 TABLET(10 MG) BY MOUTH DAILY (Patient taking differently: Take 10 mg by mouth at bedtime.)   NEEDLE, DISP, 18 G 18G X 1" MISC Use as directed   nitroGLYCERIN (NITROSTAT) 0.4 MG SL tablet Place under the tongue.   pantoprazole (PROTONIX) 40 MG tablet Take 1 tablet (40 mg total) by mouth 2 (two) times daily before a meal.   pregabalin (LYRICA) 150 MG capsule Take 1 capsule (150 mg total) by mouth 2 (two) times daily.   primidone (MYSOLINE) 50 MG tablet TAKE 2 TABLETS(100 MG) BY MOUTH DAILY   rosuvastatin (CRESTOR) 20 MG tablet TAKE 1 TABLET BY MOUTH  DAILY   Semaglutide, 1 MG/DOSE, 4 MG/3ML SOPN Inject 1 mg into the skin once a week.   SYRINGE-NEEDLE, DISP, 3 ML (LUER LOCK SAFETY SYRINGES) 21G X 1-1/2" 3 ML MISC Use as directed for testosterone administration   testosterone cypionate (DEPOTESTOSTERONE CYPIONATE) 200 MG/ML injection Inject 1.5 mLs (300 mg total) into the muscle every 14 (fourteen) days.   tiotropium (SPIRIVA) 18 MCG inhalation capsule Place into inhaler and inhale.   tiZANidine (ZANAFLEX) 4 MG tablet Take 1 tablet (4 mg total) by mouth every 8 (eight) hours as needed for muscle spasms.   triamcinolone (KENALOG) 0.025 % ointment Apply 1 application topically 2 (two) times daily.   WIXELA INHUB 250-50 MCG/DOSE AEPB Inhale 1 puff into the lungs 2 (two) times daily.   No facility-administered encounter medications on file as of 12/23/2020.    Patient Active Problem List   Diagnosis Date Noted   Chronic venous  insufficiency 11/02/2020   Leg pain 11/02/2020   Lymphedema 11/01/2020   Sinus congestion 03/04/2020   Chronic bilateral low back pain with bilateral sciatica 12/06/2018   Lumbar degenerative disc disease 12/06/2018   Lumbar facet arthropathy 12/06/2018   Sacroiliac joint pain 12/06/2018   Chronic pain syndrome 12/06/2018   Bilateral primary osteoarthritis of knee 12/06/2018   GERD (gastroesophageal reflux disease) 11/12/2018   Coronary artery disease of native artery of native heart with stable angina pectoris ([place]) 08/16/2018   Diabetic peripheral neuropathy associated with type 2 diabetes mellitus ([place]) 04/30/2018   Former smoker 04/30/2018   Obesity (BMI 30-39.9) 04/30/2018   Type 2 diabetes mellitus with hyperglycemia, with long-term current use of insulin ([place]) 04/30/2018   Hyperlipidemia associated with type 2 diabetes mellitus ([place]) 04/21/2017   Asthma 04/21/2017   RLS (restless legs syndrome) 04/21/2017  Allergic rhinitis 04/21/2017   Hypertension associated with type 2 diabetes mellitus ([place]) 04/21/2017   Major depression 04/21/2017   Tremor 04/21/2017   Hypogonadism male 04/21/2017    Conditions to be addressed/monitored:CAD, HTN, HLD, DMII, Depression, and Grief and Chronic Pain  Care Plan : RNCM; Coronary Artery Disease (Adult) and HLD  Updates made by Vanita Ingles, RN since 12/23/2020 12:00 AM  Completed 12/23/2020   Problem: RNCM: Disease Progression (Coronary Artery Disease) and HLD Resolved 12/23/2020  Priority: High     Long-Range Goal: RNCM: HLD and CAD Completed 12/23/2020  Start Date: 07/21/2020  Expected End Date: 07/21/2021  Recent Progress: On track  Priority: High  Note:   Current Barriers: Resolving, duplicate goal  Poorly controlled hyperlipidemia, complicated by stress, HTN, DM uncontrolled, COPD Current antihyperlipidemic regimen: Tricor 145 mg QD and Crestor 20 mg QD Most recent lipid panel:     Component Value Date/Time   CHOL 156  10/29/2020 1454   CHOL 157 01/29/2018 1038   TRIG 181 (H) 10/29/2020 1454   TRIG 292 (H) 01/29/2018 1038   HDL 38 (L) 10/29/2020 1454   CHOLHDL 4.1 10/29/2020 1454   VLDL 58 (H) 01/29/2018 1038   [place] 87 10/29/2020 1454   ASCVD risk enhancing conditions: age 56, DM, HTN, former smoker Unable to independently manage CAD and HLD Lacks social connections Does not contact provider office for questions/concerns RN Care Manager Clinical Goal(s):  patient will work with Consulting civil engineer, providers, and care team towards execution of optimized self-health management plan patient will verbalize understanding of plan for effective management of CAD and HLD patient will work with RNCM and pcp  to address needs related to CAD and HLD patient will take all medications exactly as prescribed and will call provider for medication related questions patient will attend all scheduled medical appointments: no upcoming appointments with the pcp, knows to call for changes, several appointments with specialist patient will demonstrate improved adherence to prescribed treatment plan for CAD and HLD the patient will demonstrate ongoing self health care management ability Interventions: Collaboration with Jon Billings, NP regarding development and update of comprehensive plan of care as evidenced by provider attestation and co-signature Inter-disciplinary care team collaboration (see longitudinal plan of care) Medication review performed; medication list updated in electronic medical record. 11-10-2020: The patient confirms compliance with medications at this time Inter-disciplinary care team collaboration (see longitudinal plan of care) Referred to pharmacy team for assistance with HLD medication management Evaluation of current treatment plan related to CAD and HLD and patient's adherence to plan as established by provider. 09-15-2020: The patient states he is eating better. The patient denies any issues  with cardiac health. Is stressed out about his wife. Will continue to monitor. 11-10-2020: The patient states he is doing okay. Taking things one day at a time. States he is eating well. Denies any issues with following dietary restrictions.  Advised patient to follow a heart healthy/ADA diet, be mindful of stress levels and other factors that impact health and well being, medication compliance and calling the office for changes in condition, or questions.  Provided education to patient re: heart healthy/ADA diet and monitoring fats in diet. The patient likes fresh fruits and vegetables and states he is trying to watch what he eats better. Also education on exercise and keeping stress levels down.  Reviewed medications with patient and discussed compliance. 11-10-2020: The patient endorses compliance with medication regimen  Reviewed scheduled/upcoming provider appointments including: 01-28-2021 at 11 am Discussed  plans with patient for ongoing care management follow up and provided patient with direct contact information for care management team Patient Goals/Self-Care Activities: - call for medicine refill 2 or 3 days before it runs out - call if I am sick and can't take my medicine - keep a list of all the medicines I take; vitamins and herbals too - learn to read medicine labels - use a pillbox to sort medicine - change to whole grain breads, cereal, pasta - drink 6 to 8 glasses of water each day - eat 3 to 5 servings of fruits and vegetables each day - eat 5 or 6 small meals each day - eat fish at least once per week - fill half the plate with nonstarchy vegetables - limit fast food meals to no more than 1 per week - manage portion size - prepare main meal at home 3 to 5 days each week - read food labels for fat, fiber, carbohydrates and portion size - be open to making changes - I can manage, know and watch for signs of a heart attack - if I have chest pain, call for help - learn about  small changes that will make a big difference - learn my personal risk factors - barriers to treatment adherence reviewed and addressed - difficulty of making life-long changes acknowledged - functional limitation screening reviewed - healthy lifestyle promoted - individualized medical nutrition therapy provided - medication-adherence assessment completed - medication side effects managed - rescue (action) plan developed - self-awareness of signs/symptoms of worsening disease encouraged Follow Up Plan: Telephone follow up appointment with care management team member scheduled for: 01-12-2021 at 09:00 am      Care Plan : RNCM: Hypertension (Adult)  Updates made by Vanita Ingles, RN since 12/23/2020 12:00 AM  Completed 12/23/2020   Problem: RNCM: Hypertension (Hypertension) Resolved 12/23/2020  Priority: Medium     Long-Range Goal: RNCM: Hypertension Monitored Completed 12/23/2020  Start Date: 07/21/2020  Expected End Date: 07/21/2021  Recent Progress: On track  Priority: Medium  Note:   Objective: Resolving, duplicate goal  Last practice recorded BP readings:  BP Readings from Last 3 Encounters:  11/02/20 101/61  10/29/20 134/82  09/08/20 122/74   Most recent eGFR/CrCl:  Lab Results  Component Value Date   EGFR 57 (L) 10/29/2020    No components found for: CRCL Current Barriers:  Knowledge Deficits related to basic understanding of hypertension pathophysiology and self care management Knowledge Deficits related to understanding of medications prescribed for management of hypertension Limited Social Support Unable to independently manage HTN  Lacks social connections Does not contact provider office for questions/concerns Case Manager Clinical Goal(s):  patient will verbalize understanding of plan for hypertension management patient will attend all scheduled medical appointments: no upcoming with the pcp, knows to call for changes, has several coming up with specialist.   patient will demonstrate improved adherence to prescribed treatment plan for hypertension as evidenced by taking all medications as prescribed, monitoring and recording blood pressure as directed, adhering to low sodium/DASH diet patient will demonstrate improved health management independence as evidenced by checking blood pressure as directed and notifying PCP if SBP>150 or DBP > 90, taking all medications as prescribe, and adhering to a low sodium diet as discussed. patient will verbalize basic understanding of hypertension disease process and self health management plan as evidenced by compliance with heart healthy/ADA diet, compliance with medications and working with the CCM team to manage health and well being.  Interventions:  Collaboration with Jon Billings, NP regarding development and update of comprehensive plan of care as evidenced by provider attestation and co-signature Inter-disciplinary care team collaboration (see longitudinal plan of care) Evaluation of current treatment plan related to hypertension self management and patient's adherence to plan as established by provider. 09-15-2020: The patient has been running some elevated blood pressures recently. The patient states he has been under a lot of stress concerned about his wife Horris Latino who has cancer. She is currently in the hospital in [facility]. Empathetic listening and support. Education provided on taking care of self and monitoring for changes so he could be there for [place] when she gets home. The patient verbalized understanding. 11-10-2020: The patient is having better control over his blood pressures. Has recently lost his wife and since then he is focusing on getting himself back on track with his health and well being. He states he is sleeping good and eating well. His daughter and grandchild are living with him and this is helpful for him. He denies any acute finding related to cardiac health and well being.  Provided  education to patient re: stroke prevention, s/s of heart attack and stroke, DASH diet, complications of uncontrolled blood pressure Reviewed medications with patient and discussed importance of compliance. 10-11--2022: Endorses compliance with medications.  Discussed plans with patient for ongoing care management follow up and provided patient with direct contact information for care management team Advised patient, providing education and rationale, to monitor blood pressure daily and record, calling PCP for findings outside established parameters.  Reviewed scheduled/upcoming provider appointments including: 01-28-2021 at 11 am Self-Care Activities: - Self administers medications as prescribed Attends all scheduled provider appointments Calls provider office for new concerns, questions, or BP outside discussed parameters Checks BP and records as discussed Follows a low sodium diet/DASH diet Patient Goals: - check blood pressure weekly - choose a place to take my blood pressure (home, clinic or office, retail store) - write blood pressure results in a log or diary - agree on reward when goals are met - agree to work together to make changes - ask questions to understand - have a family meeting to talk about healthy habits - learn about high blood pressure - blood pressure trends reviewed - depression screen reviewed - home or ambulatory blood pressure monitoring encouraged Follow Up Plan: Telephone follow up appointment with care management team member scheduled for: 01-12-2021 at 0900 am    Care Plan : RNCM: Diabetes Type 2 (Adult)  Updates made by Vanita Ingles, RN since 12/23/2020 12:00 AM  Completed 12/23/2020   Problem: RNCM: Glycemic Management (Diabetes, Type 2) Resolved 12/23/2020  Priority: High     Long-Range Goal: RNCM: Glycemic Management Optimized Completed 12/23/2020  Start Date: 07/21/2020  Expected End Date: 07/21/2021  Recent Progress: On track  Priority: High   Note:   Objective: Resolving, duplicate goal Lab Results  Component Value Date   HGBA1C 7.5 (H) 10/29/2020  8.9% at Endocrinologist on 08-31-2020 Lab Results  Component Value Date   CREATININE 1.39 (H) 10/29/2020   CREATININE 1.40 (H) 07/29/2020   CREATININE 1.09 04/28/2020   Lab Results  Component Value Date   EGFR 56 (L) 07/29/2020   Current Barriers:  Knowledge Deficits related to basic Diabetes pathophysiology and self care/management Knowledge Deficits related to medications used for management of diabetes Limited Social Support Unable to independently manage DM as evidence of hemoglobin A1C of 10.0, 09-15-2020: Newest A1C 8.9% on 08-31-2020, 10-29-2020: A1C of 7.5 Does  not adhere to provider recommendations re: following a heart healthy/ADA diet Lacks social connections Does not contact provider office for questions/concerns Case Manager Clinical Goal(s):  patient will demonstrate improved adherence to prescribed treatment plan for diabetes self care/management as evidenced by: daily monitoring and recording of CBG  adherence to ADA/ carb modified diet exercise 4/5 days/week adherence to prescribed medication regimen contacting provider for new or worsened symptoms or questions Interventions:  Collaboration with Jon Billings, NP regarding development and update of comprehensive plan of care as evidenced by provider attestation and co-signature Inter-disciplinary care team collaboration (see longitudinal plan of care) Provided education to patient about basic DM disease process Reviewed medications with patient and discussed importance of medication adherence. 11-10-2020: Endorses compliance with medications Discussed plans with patient for ongoing care management follow up and provided patient with direct contact information for care management team Provided patient with written educational materials related to hypo and hyperglycemia and importance of correct treatment.  11-10-2020: The patient was experiencing some lows with taking Lantus 85 units at HS, since changing to 70 units recently the patient states he has not experienced any lows. Will continue to monitor.  Reviewed scheduled/upcoming provider appointments including: 01-28-2021 at 11 am Advised patient, providing education and rationale, to check cbg BID and record, calling pcp for findings outside established parameters.  09-15-2020: The patient states his fasting blood sugars have been around 150-160. Denies any lows since last month. Had not been eating well due to stress of his wife's illness but he is doing better now. Education on fasting blood sugar <130 and post prandial of <180.  The patient is checking Bid and recording. Encouraged heart healthy/ADA diet. Will continue to monitor and educate accordingly. 11-10-2020: the patient is checking as recommended. States it is in much better range. Is in the 100 range consistently. Review of patient status, including review of consultants reports, relevant laboratory and other test results, and medications completed. Self-Care Activities - UNABLE to independently manage DM Attends all scheduled provider appointments Checks blood sugars as prescribed and utilize hyper and hypoglycemia protocol as needed Adheres to prescribed ADA/carb modified Patient Goals: - check blood sugar at prescribed times - check blood sugar before and after exercise - check blood sugar if I feel it is too high or too low - enter blood sugar readings and medication or insulin into daily log - take the blood sugar log to all doctor visits - change to whole grain breads, cereal, pasta - set goal weight - drink 6 to 8 glasses of water each day - eat fish at least once per week - fill half of plate with vegetables - limit fast food meals to no more than 1 per week - manage portion size - prepare main meal at home 3 to 5 days each week - read food labels for fat, fiber,  carbohydrates and portion size - schedule appointment with eye doctor - check feet daily for cuts, sores or redness - keep feet up while sitting - trim toenails straight across - wash and dry feet carefully every day - wear comfortable, cotton socks - wear comfortable, well-fitting shoes Last foot exam on 09-10-2020 with trimming of nails - barriers to adherence to treatment plan identified - blood glucose monitoring encouraged - blood glucose readings reviewed - individualized medical nutrition therapy provided - mutual A1C goal set or reviewed - resources required to improve adherence to care identified - self-awareness of signs/symptoms of hypo or hyperglycemia encouraged - use of blood  glucose monitoring log promoted Follow Up Plan: Telephone follow up appointment with care management team member scheduled for: 01-12-2021 at 0900 am          Care Plan : RNCM: Depression (Adult) and grief  Updates made by Vanita Ingles, RN since 12/23/2020 12:00 AM  Completed 12/23/2020   Problem: RNCM: Depression Identification (Depression) Resolved 12/23/2020  Priority: High     Long-Range Goal: RNCM: Depressive Symptoms Identified and Grief Completed 12/23/2020  Start Date: 07/21/2020  Expected End Date: 07/21/2021  Recent Progress: On track  Priority: High  Note:   Current Barriers: Resolving, duplicate goal  Ineffective Self Health Maintenance  Unable to independently manage depression and grief  Lacks social connections Does not contact provider office for questions/concerns Recent death of his mother on 2020/08/02 and his wife with lung cancer actively taking chemotherapy- treatments x 2 months now. 11-10-2020: The patients wife has passed Clinical Goal(s):  Collaboration with Jon Billings, NP regarding development and update of comprehensive plan of care as evidenced by provider attestation and co-signature Inter-disciplinary care team collaboration (see longitudinal plan of  care) patient will work with care management team to address care coordination and chronic disease management needs related to Disease Management Educational Needs Care Coordination Mental Health Counseling Caregiver Stress support Other grief due to the recent loss of his mother on August 02, 2020    Interventions:  Evaluation of current treatment plan related to Depression and grief  , Limited social support, Mental Health Concerns , and recent death of his mother on 2020/08/02 and his wife actively taking chemotherapy for lung cancer self-management and patient's adherence to plan as established by provider. The patient has been under a lot of stress recently. His mother passed away last week (2020-08-02) and also his wife is actively taking chemo for treatment of lung cancer. The patient states that he is managing okay. He denies any acute distress but knows this is impacting his overall health and well being. Empathetic listening and support given. Will touch base with the LCSW for assistance with depression and grief process for added support and recommendations. Will continue to monitor. 09-15-2020: The patient is still experiencing a lot of stress. Currently his wife is in the hospital in [place]. He states the cancer has moved to her spine. He is concerned about her. Empathetic listening and support. The patient states he is doing better with his conditions. 11-10-2020: The patients wife has passed. She was at home and he was able to be with her and he is thankful that she was at home with him. He states he is doing okay. His daughter and grandchild are living with him and this is helpful. He is taking one day at a time and feels like he is managing. He is focusing on his health and well being now. Reflective listening and support given.  Collaboration with Jon Billings, NP regarding development and update of comprehensive plan of care as evidenced by provider attestation       and  co-signature Inter-disciplinary care team collaboration (see longitudinal plan of care) Discussed plans with patient for ongoing care management follow up and provided patient with direct contact information for care management team Self Care Activities:  Patient verbalizes understanding of plan to manage depression and grief process  Self administers medications as prescribed Attends all scheduled provider appointments Calls pharmacy for medication refills Performs ADL's independently Performs IADL's independently Calls provider office for new concerns or questions Patient Goals: - anxiety screen reviewed -  depression screen reviewed - medication list reviewed Follow Up Plan: Telephone follow up appointment with care management team member scheduled for: [phone] at 0900 am    Care Plan : RNCM: Chronic Pain (Adult)  Updates made by Vanita Ingles, RN since 12/23/2020 12:00 AM  Completed 12/23/2020   Problem: RNCM; Chronic Pain Management (Chronic Pain) Resolved 12/23/2020  Priority: High     Long-Range Goal: RNCM: Chronic Pain Managed Completed 12/23/2020  Start Date: 11/10/2020  Expected End Date: 11/10/2021  Recent Progress: On track  Priority: High  Note:   Current Barriers: Resolving, duplicate goal  Knowledge Deficits related to managing acute/chronic pain Non-adherence to scheduled provider appointments Non-adherence to prescribed medication regimen Difficulty obtaining medications Chronic Disease Management support and education needs related to chronic pain Unable to independently manage chronic back and leg pain Lacks social connections Does not contact provider office for questions/concerns Requesting handicap placard  Nurse Case Manager Clinical Goal(s):  patient will verbalize understanding of plan for managing pain patient will work with RN Care Manager to address chronic pain and discomfort and alternative pain relief measures, on going support and  education patient will attend all scheduled medical appointments: 01-28-2021 patient will demonstrate use of different relaxation  skills and/or diversional activities to assist with pain reduction (distraction, imagery, relaxation, massage, acupressure, TENS, heat, and cold application patient will report pain at a level less than 3 to 4 on a 10-10 rating scale patient will use pharmacological and nonpharmacological pain relief strategies patient will verbalize acceptable level of pain relief and ability to engage in desired activities patient will engage in desired activities without an increase in pain level Interventions:  Collaboration with Jon Billings, NP regarding development and update of comprehensive plan of care as evidenced by provider attestation and co-signature Inter-disciplinary care team collaboration (see longitudinal plan of care) - deep breathing, relaxation and mindfulness use promoted - effectiveness of pharmacologic therapy monitored - medication-induced side effects managed - misuse of pain medication assessed - motivation and barriers to change assessed and addressed - mutually acceptable comfort goal set - pain assessed - pain treatment goals reviewed - participation in support group encouraged - premedication prior to activity encouraged Evaluation of current treatment plan related to chronic back and bilateral leg pain and patient's adherence to plan as established by provider. Advised patient to call the office for changes in level or intensity of pain or unresolved pain Provided education to patient re: working with specialist, keeping appointments, using alternative methods of pain relief, and calling for changees Reviewed medications with patient and discussed compliance Discussed plans with patient for ongoing care management follow up and provided patient with direct contact information for care management team Allow patient to maintain a diary of pain  ratings, timing, precipitating events, medications, treatments, and what works best to relieve pain,  Refer to support groups and self-help groups Educate patient about the use of pharmacological interventions for pain management- antianxiety, antidepressants, NSAIDS, opioid analgesics,  Explain the importance of lifestyle modifications to effective pain management. 11-10-2020: The patient has test scheduled for tomorrow for further evaluation and treatment options. The patient states that he is using the compression socks and this is helping. He is hoping the test will help the MD determine what will best help him. He has asked for a handicap placard as he had to turn in his wife's. Paperwork printed and placed in pcp box for signature. The office will call the patient once the paperwork has been signed. The  patient denies any falls. Education on fall prevention and safety.  Patient Goals/Self Care Activities:   Self-administers medications as prescribed Attends all scheduled provider appointments Calls pharmacy for medication refills Calls provider office for new concerns or questions Follow Up Plan: Telephone follow up appointment with care management team member scheduled for:01-12-2021 at 0900 am      Care Plan : RNCM: General Plan of Care (Adult) for Chronic Disease Management and Care Coordination Needs  Updates made by Vanita Ingles, RN since 12/23/2020 12:00 AM     Problem: RNCM: Development of Plan of Care for Chronic Disease Management (CAD, HTN, HLD, DM2, depression, grief, and chronic pain)   Priority: High     Long-Range Goal: RNCM: Effective Management of Plan of Care for Chronic Disease Management (CAD, HTN, HLD, DM2, depression, grief, and chronic pain)   Start Date: 12/23/2020  Expected End Date: 12/23/2021  Priority: High  Note:   Current Barriers:  Knowledge Deficits related to plan of care for management of CAD, HTN, HLD, DMII, Depression: depressed mood and Grief,  and chronic pain   Chronic Disease Management support and education needs related to CAD, HTN, HLD, DMII, Depression: depressed mood and Grief, and chronic pain  Lacks caregiver support.         RNCM Clinical Goal(s):  Patient will verbalize basic understanding of CAD, HTN, HLD, DMII, Depression, and chronic pain and grief  disease process and self health management plan as evidenced by keeping appointments, following the plan of care, and working with the CCM team to effectively manage chronic conditions  take all medications exactly as prescribed and will call provider for medication related questions as evidenced by compliance with medications and calling for refills before running out of medications     attend all scheduled medical appointments: 01-29-2021 as evidenced by keeping appointments and calling the office for needed schedule changes        demonstrate improved and ongoing adherence to prescribed treatment plan for CAD, HTN, HLD, DMII, Depression, and grief and chronic pain as evidenced by stable conditions, normalized blood sugars, no exacerbations in depression or grief demonstrate a decrease in CAD, HTN, HLD, DMII, Depression, and grief and chronic pain  exacerbations  as evidenced by working with the pcp and CCM team to effectively manage chronic conditions and optimize health and well being  demonstrate ongoing self health care management ability for effective management of chronic conditions  as evidenced by  working with the CCM team through collaboration with Consulting civil engineer, provider, and care team.   Interventions: 1:1 collaboration with primary care provider regarding development and update of comprehensive plan of care as evidenced by provider attestation and co-signature Inter-disciplinary care team collaboration (see longitudinal plan of care) Evaluation of current treatment plan related to  self management and patient's adherence to plan as established by provider Patient  called to give the [facility] a new phone number to reach him: [phone]   CAD  (Status: Goal on Track (progressing): YES.) Long Term Goal  Assessed understanding of CAD diagnosis Medications reviewed including medications utilized in CAD treatment plan Provided education on importance of blood pressure control in management of CAD; Provided education on Importance of limiting foods high in cholesterol; Counseled on importance of regular laboratory monitoring as prescribed; Counseled on the importance of exercise goals with target of 150 minutes per week Reviewed Importance of taking all medications as prescribed Reviewed Importance of attending all scheduled provider appointments Advised to report any changes in  symptoms or exercise tolerance  Diabetes:  (Status: Goal on Track (progressing): YES.) Long Term Goal   Lab Results  Component Value Date   HGBA1C 7.5 (H) 10/29/2020  Assessed patient's understanding of A1c goal: <7% Provided education to patient about basic DM disease process; Reviewed medications with patient and discussed importance of medication adherence;        Reviewed prescribed diet with patient heart healthy/ADA; Counseled on importance of regular laboratory monitoring as prescribed;        Discussed plans with patient for ongoing care management follow up and provided patient with direct contact information for care management team;      Provided patient with written educational materials related to hypo and hyperglycemia and importance of correct treatment;       Reviewed scheduled/upcoming provider appointments including: 01-29-2021 and specialist as directed ;         Advised patient, providing education and rationale, to check cbg before meals and at bedtime, when you have symptoms of low or high blood sugar, and before and after exercise and record        call provider for findings outside established parameters;       Review of patient status, including review of  consultants reports, relevant laboratory and other test results, and medications completed;        Depression and Grief  (Status: Goal on Track (progressing): YES.) Long Term Goal  Evaluation of current treatment plan related to Depression and grief , Limited social support, Mental Health Concerns , and Social Isolation self-management and patient's adherence to plan as established by provider. Discussed plans with patient for ongoing care management follow up and provided patient with direct contact information for care management team Advised patient to call the office for changes in mood, anxiety, depression, or inability to effectively manage the grief process over the recent death of his wife; Provided education to patient re: resources available to assist with mental health needs and the support of the CCM team to help him work through changes in his mental health/grief process; Reviewed medications with patient and discussed compliance ; Reviewed scheduled/upcoming provider appointments including 01-29-2021; Discussed plans with patient for ongoing care management follow up and provided patient with direct contact information for care management team; Advised patient to discuss changes in mental health needs, questions, or concerns with provider; Screening for signs and symptoms of depression related to chronic disease state;  Assessed social determinant of health barriers;   Hyperlipidemia:  (Status: Goal on Track (progressing): YES.) Long Term Goal  Lab Results  Component Value Date   CHOL 156 10/29/2020   HDL 38 (L) 10/29/2020   LDLCALC 87 10/29/2020   TRIG 181 (H) 10/29/2020   CHOLHDL 4.1 10/29/2020     Medication review performed; medication list updated in electronic medical record.  Provider established cholesterol goals reviewed; Counseled on importance of regular laboratory monitoring as prescribed; Provided HLD educational materials; Reviewed role and benefits of statin  for ASCVD risk reduction; Discussed strategies to manage statin-induced myalgias; Reviewed importance of limiting foods high in cholesterol;  Hypertension: (Status: Goal on Track (progressing): YES.) Last practice recorded BP readings:  BP Readings from Last 3 Encounters:  12/07/20 106/61  11/11/20 104/60  11/02/20 101/61  Most recent eGFR/CrCl:  Lab Results  Component Value Date   EGFR 57 (L) 10/29/2020    No components found for: CRCL  Evaluation of current treatment plan related to hypertension self management and patient's adherence to plan as established  by provider;   Provided education to patient re: stroke prevention, s/s of heart attack and stroke; Reviewed prescribed diet heart healthy/ADA Reviewed medications with patient and discussed importance of compliance;  Discussed plans with patient for ongoing care management follow up and provided patient with direct contact information for care management team; Advised patient, providing education and rationale, to monitor blood pressure daily and record, calling PCP for findings outside established parameters;  Provided education on prescribed diet heart healthy/ADA ;  Discussed complications of poorly controlled blood pressure such as heart disease, stroke, circulatory complications, vision complications, kidney impairment, sexual dysfunction;   Pain:  (Status: Goal on Track (progressing): YES.) Long Term Goal  Pain assessment performed Medications reviewed Reviewed provider established plan for pain management; Discussed importance of adherence to all scheduled medical appointments; Counseled on the importance of reporting any/all new or changed pain symptoms or management strategies to pain management provider; Advised patient to report to care team affect of pain on daily activities; Discussed use of relaxation techniques and/or diversional activities to assist with pain reduction (distraction, imagery, relaxation, massage,  acupressure, TENS, heat, and cold application; Reviewed with patient prescribed pharmacological and nonpharmacological pain relief strategies; Advised patient to discuss Unresolved pain, changes in level or intensity of pain  with provider;  Patient Goals/Self-Care Activities: Take medications as prescribed   Attend all scheduled provider appointments Call pharmacy for medication refills 3-7 days in advance of running out of medications Attend church or other social activities Perform all self care activities independently  Perform IADL's (shopping, preparing meals, housekeeping, managing finances) independently Call provider office for new concerns or questions  Work with the social worker to address care coordination needs and will continue to work with the clinical team to address health care and disease management related needs call the Suicide and Crisis Lifeline: 988 call the [place] National Suicide Prevention Lifeline: 832-442-1492 or TTY: 639-105-6196 TTY (567) 305-0453) to talk to a trained counselor call 1-800-273-TALK (toll free, 24 hour hotline) if experiencing a Mental Health or [place]  schedule appointment with eye doctor check blood sugar at prescribed times: before meals and at bedtime, when you have symptoms of low or high blood sugar, and before and after exercise check feet daily for cuts, sores or redness enter blood sugar readings and medication or insulin into daily log take the blood sugar log to all doctor visits trim toenails straight across drink 6 to 8 glasses of water each day eat fish at least once per week fill half of plate with vegetables limit fast food meals to no more than 1 per week manage portion size prepare main meal at home 3 to 5 days each week read food labels for fat, fiber, carbohydrates and portion size keep feet up while sitting wash and dry feet carefully every day wear comfortable, cotton socks wear comfortable,  well-fitting shoes check blood pressure weekly choose a place to take my blood pressure (home, clinic or office, retail store) write blood pressure results in a log or diary learn about high blood pressure keep a blood pressure log take blood pressure log to all doctor appointments call doctor for signs and symptoms of high blood pressure develop an action plan for high blood pressure keep all doctor appointments take medications for blood pressure exactly as prescribed report new symptoms to your doctor eat more whole grains, fruits and vegetables, lean meats and healthy fats - call for medicine refill 2 or 3 days before it runs out - take  all medications exactly as prescribed - call doctor with any symptoms you believe are related to your medicine - call doctor when you experience any new symptoms - go to all doctor appointments as scheduled - adhere to prescribed diet: Heart healthy/ADA       Plan:Telephone follow up appointment with care management team member scheduled for:  01-12-2021 and 0900 am   Noreene Larsson RN, MSN, [facility] Mobile: [phone]

## 2020-12-23 NOTE — Telephone Encounter (Signed)
Called and LVM asking for patient to please return my call.  

## 2020-12-27 ENCOUNTER — Other Ambulatory Visit: Payer: Self-pay | Admitting: Nurse Practitioner

## 2020-12-27 DIAGNOSIS — E1141 Type 2 diabetes mellitus with diabetic mononeuropathy: Secondary | ICD-10-CM

## 2020-12-28 NOTE — Telephone Encounter (Signed)
Requested Prescriptions  Pending Prescriptions Disp Refills  . amitriptyline (ELAVIL) 50 MG tablet [Pharmacy Med Name: AMITRIPTYLINE 50MG  TABLETS] 180 tablet 1    Sig: TAKE 1 TO 2 TABLETS(50 TO 100 MG) BY MOUTH AT BEDTIME     Psychiatry:  Antidepressants - Heterocyclics (TCAs) Passed - 12/27/2020 11:44 AM      Passed - Completed PHQ-2 or PHQ-9 in the last 360 days      Passed - Valid encounter within last 6 months    Recent Outpatient Visits          2 months ago Hypertension associated with type 2 diabetes mellitus (HCC)   [facility], NP   5 months ago Hypertension associated with type 2 diabetes mellitus (HCC)   [facility], NP   5 months ago Type 2 diabetes mellitus with hyperglycemia, with long-term current use of insulin (HCC)   [facility] [place], Megan P, DO   8 months ago Coronary artery disease of native artery of native heart with stable angina pectoris (HCC)   [facility], NP   9 months ago Type 2 diabetes mellitus with hyperglycemia, with long-term current use of insulin (HCC)   [facility] [place], [place], NP      Future Appointments            In 1 month Dorie Rank, NP [facility]   In 1 month [facility], MD [facility]   In 8 months Stoioff, Deirdre Evener, MD [facility]   In 10 months  [facility], [facility]

## 2020-12-31 NOTE — Telephone Encounter (Signed)
Pt called in for assistance. Pt says that he was seen at [facility]. Pt says that the dentist molded and fixed his teeth. Pt says that he is still experiencing some concern such as his teeth aren't fitting correctly, ( to big for his mouth) pt says that he told the dentist and was told that he's not sure what else to do to help. Pt would like to know if provider could place a referral to a different location? Advised of providers response below. Pt says that he is unsure of any dentist and would like to have providers help with this    Phone: [phone]

## 2021-01-04 ENCOUNTER — Other Ambulatory Visit: Payer: Self-pay | Admitting: Family Medicine

## 2021-01-04 ENCOUNTER — Telehealth (INDEPENDENT_AMBULATORY_CARE_PROVIDER_SITE_OTHER): Payer: Self-pay

## 2021-01-04 NOTE — Telephone Encounter (Signed)
Pt called to inquire if Corey [name] had found out anything about someone that can help with his teeth / please advise / pt stated he does have dental insurance

## 2021-01-04 NOTE — Telephone Encounter (Signed)
Pt was advised to call office for refill request due to pharmacy sending request to the wrong provider / please advise   azelastine (ASTELIN) 0.1 % nasal spray  [id number] Nicholes Rough, [place] - [address] AT [facility] OF [address]  [address] [place], [place] [address]  Phone:  [phone]  Fax:  [phone]

## 2021-01-04 NOTE — Telephone Encounter (Signed)
This was received from Sherlyn Lick at [employer]:   HI Corey [name], [place] all is well. We have called  patient MRN [id number] 5 times leaving 5 messages with no response. Also tried his daughter who didn't answer and doesn't have voicemail set up.   Wanting to update you.   Kristie Cowman Sr. Media planner [employer]

## 2021-01-04 NOTE — Telephone Encounter (Signed)
Requested Prescriptions  Pending Prescriptions Disp Refills  . azelastine (ASTELIN) 0.1 % nasal spray [Pharmacy Med Name: AZELASTINE 0.1%(137MCG) NASAL-200SP] 30 mL 1    Sig: USE 2 SPRAYS IN EACH NOSTRIL TWICE DAILY     Ear, Nose, and Throat: Nasal Preparations - Antiallergy Passed - 01/04/2021  1:56 PM      Passed - Valid encounter within last 12 months    Recent Outpatient Visits          2 months ago Hypertension associated with type 2 diabetes mellitus (HCC)   [facility] [name], NP   5 months ago Hypertension associated with type 2 diabetes mellitus (HCC)   [facility] [name], NP   5 months ago Type 2 diabetes mellitus with hyperglycemia, with long-term current use of insulin (HCC)   [facility] [place], Megan P, DO   8 months ago Coronary artery disease of native artery of native heart with stable angina pectoris (HCC)   [facility] [name], NP   10 months ago Type 2 diabetes mellitus with hyperglycemia, with long-term current use of insulin (HCC)   [facility] [place], Dorie Rank, NP      Future Appointments            In 3 weeks [name], NP [facility]   In 1 month Deirdre Evener, MD [facility]   In 7 months Stoioff, Verna Czech, MD [facility]   In 10 months  [facility], [facility]

## 2021-01-12 ENCOUNTER — Other Ambulatory Visit: Payer: Self-pay | Admitting: Nurse Practitioner

## 2021-01-12 ENCOUNTER — Ambulatory Visit: Payer: Self-pay

## 2021-01-12 ENCOUNTER — Telehealth: Payer: Medicare Other

## 2021-01-12 DIAGNOSIS — E1169 Type 2 diabetes mellitus with other specified complication: Secondary | ICD-10-CM

## 2021-01-12 DIAGNOSIS — E1159 Type 2 diabetes mellitus with other circulatory complications: Secondary | ICD-10-CM

## 2021-01-12 DIAGNOSIS — I10 Essential (primary) hypertension: Secondary | ICD-10-CM

## 2021-01-12 DIAGNOSIS — E782 Mixed hyperlipidemia: Secondary | ICD-10-CM

## 2021-01-12 DIAGNOSIS — F3341 Major depressive disorder, recurrent, in partial remission: Secondary | ICD-10-CM

## 2021-01-12 DIAGNOSIS — Z794 Long term (current) use of insulin: Secondary | ICD-10-CM

## 2021-01-12 DIAGNOSIS — E1165 Type 2 diabetes mellitus with hyperglycemia: Secondary | ICD-10-CM

## 2021-01-12 DIAGNOSIS — M5442 Lumbago with sciatica, left side: Secondary | ICD-10-CM

## 2021-01-12 DIAGNOSIS — I25118 Atherosclerotic heart disease of native coronary artery with other forms of angina pectoris: Secondary | ICD-10-CM

## 2021-01-12 DIAGNOSIS — E785 Hyperlipidemia, unspecified: Secondary | ICD-10-CM

## 2021-01-12 DIAGNOSIS — F4321 Adjustment disorder with depressed mood: Secondary | ICD-10-CM

## 2021-01-12 DIAGNOSIS — G8929 Other chronic pain: Secondary | ICD-10-CM

## 2021-01-12 DIAGNOSIS — G894 Chronic pain syndrome: Secondary | ICD-10-CM

## 2021-01-12 NOTE — Patient Instructions (Signed)
Visit Information  Thank you for taking time to visit with me today. Please don't hesitate to contact me if I can be of assistance to you before our next scheduled telephone appointment.  Following are the goals we discussed today:    RNCM Clinical Goal(s):  Patient will verbalize basic understanding of CAD, HTN, HLD, DMII, Depression, and chronic pain and grief  disease process and self health management plan as evidenced by keeping appointments, following the plan of care, and working with the CCM team to effectively manage chronic conditions  take all medications exactly as prescribed and will call provider for medication related questions as evidenced by compliance with medications and calling for refills before running out of medications     attend all scheduled medical appointments: 01-28-2021 as evidenced by keeping appointments and calling the office for needed schedule changes        demonstrate improved and ongoing adherence to prescribed treatment plan for CAD, HTN, HLD, DMII, Depression, and grief and chronic pain as evidenced by stable conditions, normalized blood sugars, no exacerbations in depression or grief demonstrate a decrease in CAD, HTN, HLD, DMII, Depression, and grief and chronic pain  exacerbations  as evidenced by working with the pcp and CCM team to effectively manage chronic conditions and optimize health and well being  demonstrate ongoing self health care management ability for effective management of chronic conditions  as evidenced by  working with the CCM team through collaboration with Consulting civil engineer, provider, and care team.    Interventions: 1:1 collaboration with primary care provider regarding development and update of comprehensive plan of care as evidenced by provider attestation and co-signature Inter-disciplinary care team collaboration (see longitudinal plan of care) Evaluation of current treatment plan related to  self management and patient's adherence  to plan as established by provider Patient called to give the [facility] a new phone number to reach him: [phone]     CAD  (Status: Goal on Track (progressing): YES.) Long Term Goal  Assessed understanding of CAD diagnosis Medications reviewed including medications utilized in CAD treatment plan Provided education on importance of blood pressure control in management of CAD; Provided education on Importance of limiting foods high in cholesterol; Counseled on importance of regular laboratory monitoring as prescribed; Counseled on the importance of exercise goals with target of 150 minutes per week Reviewed Importance of taking all medications as prescribed Reviewed Importance of attending all scheduled provider appointments Advised to report any changes in symptoms or exercise tolerance   Diabetes:  (Status: Goal on Track (progressing): YES.) Long Term Goal         Lab Results  Component Value Date    HGBA1C 7.5 (H) 10/29/2020  Assessed patient's understanding of A1c goal: <7% Provided education to patient about basic DM disease process; Reviewed medications with patient and discussed importance of medication adherence. 01-12-2021: The patient states that he is taking medications as directed ;        Reviewed prescribed diet with patient heart healthy/ADA. 01-12-2021: Review of heart healthy/ADA diet. The patient is eating better and doing well; Counseled on importance of regular laboratory monitoring as prescribed;        Discussed plans with patient for ongoing care management follow up and provided patient with direct contact information for care management team;      Provided patient with written educational materials related to hypo and hyperglycemia and importance of correct treatment. 01-12-2021: Denies any issues with lows or highs.  Reviewed scheduled/upcoming provider appointments including: 01-28-2021 and specialist as directed ;         Advised patient, providing education  and rationale, to check cbg before meals and at bedtime, when you have symptoms of low or high blood sugar, and before and after exercise and record        call provider for findings outside established parameters;       Review of patient status, including review of consultants reports, relevant laboratory and other test results, and medications completed;         Depression and Grief  (Status: Goal on Track (progressing): YES.) Long Term Goal  Evaluation of current treatment plan related to Depression and grief , Limited social support, Mental Health Concerns , and Social Isolation self-management and patient's adherence to plan as established by provider. Discussed plans with patient for ongoing care management follow up and provided patient with direct contact information for care management team Advised patient to call the office for changes in mood, anxiety, depression, or inability to effectively manage the grief process over the recent death of his wife; Provided education to patient re: resources available to assist with mental health needs and the support of the CCM team to help him work through changes in his mental health/grief process; Reviewed medications with patient and discussed compliance. 01-12-2021: Is doing well. Denies any issues with compliance ; Reviewed scheduled/upcoming provider appointments including 01-28-2021; Discussed plans with patient for ongoing care management follow up and provided patient with direct contact information for care management team; Advised patient to discuss changes in mental health needs, questions, or concerns with provider; Screening for signs and symptoms of depression related to chronic disease state;  Assessed social determinant of health barriers;    Hyperlipidemia:  (Status: Goal on Track (progressing): YES.) Long Term Goal       Lab Results  Component Value Date    CHOL 156 10/29/2020    HDL 38 (L) 10/29/2020    LDLCALC 87 10/29/2020     TRIG 181 (H) 10/29/2020    CHOLHDL 4.1 10/29/2020      Medication review performed; medication list updated in electronic medical record.  Provider established cholesterol goals reviewed; Counseled on importance of regular laboratory monitoring as prescribed; Provided HLD educational materials; Reviewed role and benefits of statin for ASCVD risk reduction; Discussed strategies to manage statin-induced myalgias; Reviewed importance of limiting foods high in cholesterol. 01-12-2021: Review and evaluated   Hypertension: (Status: Goal on Track (progressing): YES.) Last practice recorded BP readings:     BP Readings from Last 3 Encounters:  12/07/20 106/61  11/11/20 104/60  11/02/20 101/61  Most recent eGFR/CrCl:       Lab Results  Component Value Date    EGFR 57 (L) 10/29/2020    No components found for: CRCL   Evaluation of current treatment plan related to hypertension self management and patient's adherence to plan as established by provider. 01-12-2021: Denies any issues with HTN  or heart health at this time   Provided education to patient re: stroke prevention, s/s of heart attack and stroke; Reviewed prescribed diet heart healthy/ADA Reviewed medications with patient and discussed importance of compliance;  Discussed plans with patient for ongoing care management follow up and provided patient with direct contact information for care management team; Advised patient, providing education and rationale, to monitor blood pressure daily and record, calling PCP for findings outside established parameters;  Provided education on prescribed diet heart healthy/ADA ;  Discussed complications  of poorly controlled blood pressure such as heart disease, stroke, circulatory complications, vision complications, kidney impairment, sexual dysfunction;    Pain:  (Status: Goal on Track (progressing): YES.) Long Term Goal  Pain assessment performed. 01-12-2021: Denies any pain today. States  his pain level is "zero" Medications reviewed. 01-12-2021: takes medications as directed  Reviewed provider established plan for pain management; Discussed importance of adherence to all scheduled medical appointments; Counseled on the importance of reporting any/all new or changed pain symptoms or management strategies to pain management provider; Advised patient to report to care team affect of pain on daily activities; Discussed use of relaxation techniques and/or diversional activities to assist with pain reduction (distraction, imagery, relaxation, massage, acupressure, TENS, heat, and cold application; Reviewed with patient prescribed pharmacological and nonpharmacological pain relief strategies; Advised patient to discuss Unresolved pain, changes in level or intensity of pain  with provider;   Patient Goals/Self-Care Activities: Take medications as prescribed   Attend all scheduled provider appointments Call pharmacy for medication refills 3-7 days in advance of running out of medications Attend church or other social activities Perform all self care activities independently  Perform IADL's (shopping, preparing meals, housekeeping, managing finances) independently Call provider office for new concerns or questions  Work with the social worker to address care coordination needs and will continue to work with the clinical team to address health care and disease management related needs call the Suicide and Crisis Lifeline: 988 call the [place] National Suicide Prevention Lifeline: 929-294-8454 or TTY: 479 595 7578 TTY (510) 215-5808) to talk to a trained counselor call 1-800-273-TALK (toll free, 24 hour hotline) if experiencing a Mental Health or [place]  schedule appointment with eye doctor check blood sugar at prescribed times: before meals and at bedtime, when you have symptoms of low or high blood sugar, and before and after exercise check feet daily for cuts, sores or  redness enter blood sugar readings and medication or insulin into daily log take the blood sugar log to all doctor visits trim toenails straight across drink 6 to 8 glasses of water each day eat fish at least once per week fill half of plate with vegetables limit fast food meals to no more than 1 per week manage portion size prepare main meal at home 3 to 5 days each week read food labels for fat, fiber, carbohydrates and portion size keep feet up while sitting wash and dry feet carefully every day wear comfortable, cotton socks wear comfortable, well-fitting shoes check blood pressure weekly choose a place to take my blood pressure (home, clinic or office, retail store) write blood pressure results in a log or diary learn about high blood pressure keep a blood pressure log take blood pressure log to all doctor appointments call doctor for signs and symptoms of high blood pressure develop an action plan for high blood pressure keep all doctor appointments take medications for blood pressure exactly as prescribed report new symptoms to your doctor eat more whole grains, fruits and vegetables, lean meats and healthy fats - call for medicine refill 2 or 3 days before it runs out - take all medications exactly as prescribed - call doctor with any symptoms you believe are related to your medicine - call doctor when you experience any new symptoms - go to all doctor appointments as scheduled - adhere to prescribed diet: Heart healthy/ADA          Our next appointment is by telephone on 03-16-2020 at 0900 am  Please call the  care guide team at [phone] if you need to cancel or reschedule your appointment.   If you are experiencing a Mental Health or [place] or need someone to talk to, please call the Suicide and Crisis Lifeline: 988 call the [place] National Suicide Prevention Lifeline: 5643984128 or TTY: 806-617-5502 TTY (475) 497-4153) to talk to a trained  counselor call 1-800-273-TALK (toll free, 24 hour hotline)   Patient verbalizes understanding of instructions provided today and agrees to view in [place].   Noreene Larsson RN, MSN, [facility] Mobile: [phone]

## 2021-01-12 NOTE — Telephone Encounter (Signed)
Requested Prescriptions  Pending Prescriptions Disp Refills   rosuvastatin (CRESTOR) 20 MG tablet [Pharmacy Med Name: Rosuvastatin Calcium 20 MG Oral Tablet] 90 tablet 2    Sig: TAKE 1 TABLET BY MOUTH  DAILY     Cardiovascular:  Antilipid - Statins Failed - 01/12/2021 11:00 PM      Failed - HDL in normal range and within 360 days    HDL  Date Value Ref Range Status  10/29/2020 38 (L) >39 mg/dL Final         Failed - Triglycerides in normal range and within 360 days    Triglycerides  Date Value Ref Range Status  10/29/2020 181 (H) 0 - 149 mg/dL Final   Triglycerides Piccolo,Waived  Date Value Ref Range Status  01/29/2018 292 (H) <150 mg/dL Final    Comment:                            Normal                   <150                         Borderline High     150 - 199                         High                200 - 499                         Very High                >499          Passed - Total Cholesterol in normal range and within 360 days    Cholesterol, Total  Date Value Ref Range Status  10/29/2020 156 100 - 199 mg/dL Final   Cholesterol Piccolo, Waived  Date Value Ref Range Status  01/29/2018 157 <200 mg/dL Final    Comment:                            Desirable                <200                         Borderline High      200- 239                         High                     >239          Passed - LDL in normal range and within 360 days    LDL Chol Calc (NIH)  Date Value Ref Range Status  10/29/2020 87 0 - 99 mg/dL Final         Passed - Patient is not pregnant      Passed - Valid encounter within last 12 months    Recent Outpatient Visits          2 months ago Hypertension associated with type 2 diabetes mellitus (HCC)   [facility] [name], NP   5 months ago Hypertension associated with type 2  diabetes mellitus (HCC)   [facility] [name], NP   5 months ago Type 2 diabetes mellitus with  hyperglycemia, with long-term current use of insulin [facility])   [facility] Savage, Megan P, DO   8 months ago Coronary artery disease of native artery of native heart with stable angina pectoris [facility])   [facility] [name], NP   10 months ago Type 2 diabetes mellitus with hyperglycemia, with long-term current use of insulin [facility])   [facility] Marjie Skiff, NP      Future Appointments            In 2 weeks [name], NP [facility]   In 1 month Deirdre Evener, MD [facility]   In 7 months Stoioff, Verna Czech, MD [facility]   In 10 months  [facility], [facility]

## 2021-01-12 NOTE — Chronic Care Management (AMB) (Signed)
Chronic Care Management   CCM RN Visit Note  01/12/2021 Name: Corey [name] MRN: [id number] DOB: Jun 05, 1955  Subjective: Corey [name] is a 65 y.o. year old male who is a primary care patient of Jon Billings, NP. The care management team was consulted for assistance with disease management and care coordination needs.    Engaged with patient by telephone for follow up visit in response to provider referral for case management and/or care coordination services.   Consent to Services:  The patient was given information about Chronic Care Management services, agreed to services, and gave verbal consent prior to initiation of services.  Please see initial visit note for detailed documentation.   Patient agreed to services and verbal consent obtained.   Assessment: Review of patient past medical history, allergies, medications, health status, including review of consultants reports, laboratory and other test data, was performed as part of comprehensive evaluation and provision of chronic care management services.   SDOH (Social Determinants of Health) assessments and interventions performed:    CCM Care Plan  No Known Allergies  Outpatient Encounter Medications as of 01/12/2021  Medication Sig   Accu-Chek FastClix Lancets MISC USE TO CHECK BLOOD SUGAR AS DIRECTED   acetaminophen (TYLENOL) 500 MG tablet Take 1,000 mg by mouth every 6 (six) hours as needed for moderate pain or headache.   albuterol (PROVENTIL) (2.5 MG/3ML) 0.083% nebulizer solution USE 1 VIAL IN NEBULIZER EVERY 6 HOURS - and as needed.   albuterol (VENTOLIN HFA) 108 (90 Base) MCG/ACT inhaler INHALE 2 PUFFS INTO THE LUNGS EVERY 6 HOURS AS NEEDED FOR WHEEZING OR SHORTNESS OF BREATH (Patient taking differently: Inhale 2 puffs into the lungs every 6 (six) hours as needed for wheezing or shortness of breath.)   amitriptyline (ELAVIL) 50 MG tablet TAKE 1 TO 2 TABLETS(50 TO 100 MG) BY MOUTH AT BEDTIME   aspirin EC 81 MG  tablet Take 81 mg by mouth daily.   azelastine (ASTELIN) 0.1 % nasal spray USE 2 SPRAYS IN EACH NOSTRIL TWICE DAILY   B-D UF III MINI PEN NEEDLES 31G X 5 MM MISC USE TWICE DAILY   Blood Glucose Monitoring Suppl (ONETOUCH VERIO) w/Device KIT Use to check blood sugar 2-3 times daily and document for provider visits.   buPROPion (WELLBUTRIN XL) 150 MG 24 hr tablet TAKE 1 TABLET(150 MG) BY MOUTH DAILY   cetirizine (ZYRTEC) 10 MG tablet TAKE 1 TABLET BY MOUTH DAILY   Cholecalciferol (VITAMIN D3) 5000 units TABS Take 5,000 Units by mouth daily.    ciclopirox (PENLAC) 8 % solution Apply topically at bedtime. Apply over nail and surrounding skin. Apply daily over previous coat. After seven (7) days, may remove with alcohol and continue cycle.   citalopram (CELEXA) 10 MG tablet TAKE 1 TABLET BY MOUTH  DAILY   clopidogrel (PLAVIX) 75 MG tablet Take 75 mg by mouth daily.   diclofenac sodium (VOLTAREN) 1 % GEL Apply 1 application topically 4 (four) times daily as needed (pain).    famotidine (PEPCID) 40 MG tablet TAKE 1 TABLET(40 MG) BY MOUTH DAILY   fenofibrate (TRICOR) 145 MG tablet TAKE 1 TABLET BY MOUTH  DAILY   furosemide (LASIX) 20 MG tablet TAKE 1 TABLET(20 MG) BY MOUTH TWICE DAILY   glucose blood test strip Use to check blood sugar 2-3 times daily and document for provider visits.   isosorbide mononitrate (IMDUR) 60 MG 24 hr tablet Take 60 mg by mouth daily.   JARDIANCE 25 MG TABS tablet Take 1  tablet (25 mg total) by mouth daily.   ketoconazole (NIZORAL) 2 % cream Apply to corners of mouth at bedtime   ketorolac (TORADOL) 10 MG tablet Take 1 tablet (10 mg total) by mouth every 6 (six) hours as needed.   Lancets Misc. (ACCU-CHEK FASTCLIX LANCET) KIT 1 Units by Does not apply route 2 (two) times daily.   LANTUS SOLOSTAR 100 UNIT/ML Solostar Pen Inject 85 Units into the skin at bedtime. (Patient taking differently: Inject 70 Units into the skin at bedtime.)   losartan (COZAAR) 50 MG tablet Take 1  tablet (50 mg total) by mouth daily.   magnesium gluconate (MAGONATE) 500 MG tablet Take 500 mg by mouth daily.   meloxicam (MOBIC) 7.5 MG tablet TAKE 1 TABLET(7.5 MG) BY MOUTH DAILY   metFORMIN (GLUCOPHAGE) 1000 MG tablet TAKE 1 TABLET BY MOUTH  TWICE DAILY   metoprolol succinate (TOPROL-XL) 25 MG 24 hr tablet Take 25 mg by mouth daily.   montelukast (SINGULAIR) 10 MG tablet TAKE 1 TABLET(10 MG) BY MOUTH DAILY (Patient taking differently: Take 10 mg by mouth at bedtime.)   NEEDLE, DISP, 18 G 18G X 1" MISC Use as directed   nitroGLYCERIN (NITROSTAT) 0.4 MG SL tablet Place under the tongue.   pantoprazole (PROTONIX) 40 MG tablet Take 1 tablet (40 mg total) by mouth 2 (two) times daily before a meal.   pregabalin (LYRICA) 150 MG capsule Take 1 capsule (150 mg total) by mouth 2 (two) times daily.   primidone (MYSOLINE) 50 MG tablet TAKE 2 TABLETS(100 MG) BY MOUTH DAILY   rosuvastatin (CRESTOR) 20 MG tablet TAKE 1 TABLET BY MOUTH  DAILY   Semaglutide, 1 MG/DOSE, 4 MG/3ML SOPN Inject 1 mg into the skin once a week.   SYRINGE-NEEDLE, DISP, 3 ML (LUER LOCK SAFETY SYRINGES) 21G X 1-1/2" 3 ML MISC Use as directed for testosterone administration   testosterone cypionate (DEPOTESTOSTERONE CYPIONATE) 200 MG/ML injection Inject 1.5 mLs (300 mg total) into the muscle every 14 (fourteen) days.   tiotropium (SPIRIVA) 18 MCG inhalation capsule Place into inhaler and inhale.   tiZANidine (ZANAFLEX) 4 MG tablet Take 1 tablet (4 mg total) by mouth every 8 (eight) hours as needed for muscle spasms.   triamcinolone (KENALOG) 0.025 % ointment Apply 1 application topically 2 (two) times daily.   WIXELA INHUB 250-50 MCG/DOSE AEPB Inhale 1 puff into the lungs 2 (two) times daily.   No facility-administered encounter medications on file as of 01/12/2021.    Patient Active Problem List   Diagnosis Date Noted   Chronic venous insufficiency 11/02/2020   Leg pain 11/02/2020   Lymphedema 11/01/2020   Sinus congestion  03/04/2020   Chronic bilateral low back pain with bilateral sciatica 12/06/2018   Lumbar degenerative disc disease 12/06/2018   Lumbar facet arthropathy 12/06/2018   Sacroiliac joint pain 12/06/2018   Chronic pain syndrome 12/06/2018   Bilateral primary osteoarthritis of knee 12/06/2018   GERD (gastroesophageal reflux disease) 11/12/2018   Coronary artery disease of native artery of native heart with stable angina pectoris ([place]) 08/16/2018   Diabetic peripheral neuropathy associated with type 2 diabetes mellitus ([place]) 04/30/2018   Former smoker 04/30/2018   Obesity (BMI 30-39.9) 04/30/2018   Type 2 diabetes mellitus with hyperglycemia, with long-term current use of insulin ([place]) 04/30/2018   Hyperlipidemia associated with type 2 diabetes mellitus ([place]) 04/21/2017   Asthma 04/21/2017   RLS (restless legs syndrome) 04/21/2017   Allergic rhinitis 04/21/2017   Hypertension associated with type 2 diabetes mellitus (  [place]) 04/21/2017   Major depression 04/21/2017   Tremor 04/21/2017   Hypogonadism male 04/21/2017    Conditions to be addressed/monitored:CAD, HTN, HLD, DMII, Depression, and grief and chronic pain   Care Plan : RNCM: General Plan of Care (Adult) for Chronic Disease Management and Care Coordination Needs  Updates made by Vanita Ingles, RN since 01/12/2021 12:00 AM     Problem: RNCM: Development of Plan of Care for Chronic Disease Management (CAD, HTN, HLD, DM2, depression, grief, and chronic pain)   Priority: High     Long-Range Goal: RNCM: Effective Management of Plan of Care for Chronic Disease Management (CAD, HTN, HLD, DM2, depression, grief, and chronic pain)   Start Date: 12/23/2020  Expected End Date: 12/23/2021  Priority: High  Note:   Current Barriers:  Knowledge Deficits related to plan of care for management of CAD, HTN, HLD, DMII, Depression: depressed mood and Grief, and chronic pain   Chronic Disease Management support and education needs related to CAD,  HTN, HLD, DMII, Depression: depressed mood and Grief, and chronic pain  Lacks caregiver support.         RNCM Clinical Goal(s):  Patient will verbalize basic understanding of CAD, HTN, HLD, DMII, Depression, and chronic pain and grief  disease process and self health management plan as evidenced by keeping appointments, following the plan of care, and working with the CCM team to effectively manage chronic conditions  take all medications exactly as prescribed and will call provider for medication related questions as evidenced by compliance with medications and calling for refills before running out of medications     attend all scheduled medical appointments: 01-28-2021 as evidenced by keeping appointments and calling the office for needed schedule changes        demonstrate improved and ongoing adherence to prescribed treatment plan for CAD, HTN, HLD, DMII, Depression, and grief and chronic pain as evidenced by stable conditions, normalized blood sugars, no exacerbations in depression or grief demonstrate a decrease in CAD, HTN, HLD, DMII, Depression, and grief and chronic pain  exacerbations  as evidenced by working with the pcp and CCM team to effectively manage chronic conditions and optimize health and well being  demonstrate ongoing self health care management ability for effective management of chronic conditions  as evidenced by  working with the CCM team through collaboration with Consulting civil engineer, provider, and care team.   Interventions: 1:1 collaboration with primary care provider regarding development and update of comprehensive plan of care as evidenced by provider attestation and co-signature Inter-disciplinary care team collaboration (see longitudinal plan of care) Evaluation of current treatment plan related to  self management and patient's adherence to plan as established by provider Patient called to give the [facility] a new phone number to reach him: [phone]   CAD  (Status:  Goal on Track (progressing): YES.) Long Term Goal  Assessed understanding of CAD diagnosis Medications reviewed including medications utilized in CAD treatment plan Provided education on importance of blood pressure control in management of CAD; Provided education on Importance of limiting foods high in cholesterol; Counseled on importance of regular laboratory monitoring as prescribed; Counseled on the importance of exercise goals with target of 150 minutes per week Reviewed Importance of taking all medications as prescribed Reviewed Importance of attending all scheduled provider appointments Advised to report any changes in symptoms or exercise tolerance  Diabetes:  (Status: Goal on Track (progressing): YES.) Long Term Goal   Lab Results  Component Value Date   HGBA1C  7.5 (H) 10/29/2020  Assessed patient's understanding of A1c goal: <7% Provided education to patient about basic DM disease process; Reviewed medications with patient and discussed importance of medication adherence. 01-12-2021: The patient states that he is taking medications as directed ;        Reviewed prescribed diet with patient heart healthy/ADA. 01-12-2021: Review of heart healthy/ADA diet. The patient is eating better and doing well; Counseled on importance of regular laboratory monitoring as prescribed;        Discussed plans with patient for ongoing care management follow up and provided patient with direct contact information for care management team;      Provided patient with written educational materials related to hypo and hyperglycemia and importance of correct treatment. 01-12-2021: Denies any issues with lows or highs.       Reviewed scheduled/upcoming provider appointments including: 01-28-2021 and specialist as directed ;         Advised patient, providing education and rationale, to check cbg before meals and at bedtime, when you have symptoms of low or high blood sugar, and before and after exercise and  record        call provider for findings outside established parameters;       Review of patient status, including review of consultants reports, relevant laboratory and other test results, and medications completed;        Depression and Grief  (Status: Goal on Track (progressing): YES.) Long Term Goal  Evaluation of current treatment plan related to Depression and grief , Limited social support, Mental Health Concerns , and Social Isolation self-management and patient's adherence to plan as established by provider. Discussed plans with patient for ongoing care management follow up and provided patient with direct contact information for care management team Advised patient to call the office for changes in mood, anxiety, depression, or inability to effectively manage the grief process over the recent death of his wife; Provided education to patient re: resources available to assist with mental health needs and the support of the CCM team to help him work through changes in his mental health/grief process; Reviewed medications with patient and discussed compliance. 01-12-2021: Is doing well. Denies any issues with compliance ; Reviewed scheduled/upcoming provider appointments including 01-28-2021; Discussed plans with patient for ongoing care management follow up and provided patient with direct contact information for care management team; Advised patient to discuss changes in mental health needs, questions, or concerns with provider; Screening for signs and symptoms of depression related to chronic disease state;  Assessed social determinant of health barriers;   Hyperlipidemia:  (Status: Goal on Track (progressing): YES.) Long Term Goal  Lab Results  Component Value Date   CHOL 156 10/29/2020   HDL 38 (L) 10/29/2020   LDLCALC 87 10/29/2020   TRIG 181 (H) 10/29/2020   CHOLHDL 4.1 10/29/2020     Medication review performed; medication list updated in electronic medical record.  Provider  established cholesterol goals reviewed; Counseled on importance of regular laboratory monitoring as prescribed; Provided HLD educational materials; Reviewed role and benefits of statin for ASCVD risk reduction; Discussed strategies to manage statin-induced myalgias; Reviewed importance of limiting foods high in cholesterol. 01-12-2021: Review and evaluated  Hypertension: (Status: Goal on Track (progressing): YES.) Last practice recorded BP readings:  BP Readings from Last 3 Encounters:  12/07/20 106/61  11/11/20 104/60  11/02/20 101/61  Most recent eGFR/CrCl:  Lab Results  Component Value Date   EGFR 57 (L) 10/29/2020    No components found  for: CRCL  Evaluation of current treatment plan related to hypertension self management and patient's adherence to plan as established by provider. 01-12-2021: Denies any issues with HTN  or heart health at this time   Provided education to patient re: stroke prevention, s/s of heart attack and stroke; Reviewed prescribed diet heart healthy/ADA Reviewed medications with patient and discussed importance of compliance;  Discussed plans with patient for ongoing care management follow up and provided patient with direct contact information for care management team; Advised patient, providing education and rationale, to monitor blood pressure daily and record, calling PCP for findings outside established parameters;  Provided education on prescribed diet heart healthy/ADA ;  Discussed complications of poorly controlled blood pressure such as heart disease, stroke, circulatory complications, vision complications, kidney impairment, sexual dysfunction;   Pain:  (Status: Goal on Track (progressing): YES.) Long Term Goal  Pain assessment performed. 01-12-2021: Denies any pain today. States his pain level is "zero" Medications reviewed. 01-12-2021: takes medications as directed  Reviewed provider established plan for pain management; Discussed importance of  adherence to all scheduled medical appointments; Counseled on the importance of reporting any/all new or changed pain symptoms or management strategies to pain management provider; Advised patient to report to care team affect of pain on daily activities; Discussed use of relaxation techniques and/or diversional activities to assist with pain reduction (distraction, imagery, relaxation, massage, acupressure, TENS, heat, and cold application; Reviewed with patient prescribed pharmacological and nonpharmacological pain relief strategies; Advised patient to discuss Unresolved pain, changes in level or intensity of pain  with provider;  Patient Goals/Self-Care Activities: Take medications as prescribed   Attend all scheduled provider appointments Call pharmacy for medication refills 3-7 days in advance of running out of medications Attend church or other social activities Perform all self care activities independently  Perform IADL's (shopping, preparing meals, housekeeping, managing finances) independently Call provider office for new concerns or questions  Work with the social worker to address care coordination needs and will continue to work with the clinical team to address health care and disease management related needs call the Suicide and Crisis Lifeline: 988 call the [place] National Suicide Prevention Lifeline: 919 832 8923 or TTY: 430-013-4005 TTY 9034365366) to talk to a trained counselor call 1-800-273-TALK (toll free, 24 hour hotline) if experiencing a Mental Health or [place]  schedule appointment with eye doctor check blood sugar at prescribed times: before meals and at bedtime, when you have symptoms of low or high blood sugar, and before and after exercise check feet daily for cuts, sores or redness enter blood sugar readings and medication or insulin into daily log take the blood sugar log to all doctor visits trim toenails straight across drink 6 to 8  glasses of water each day eat fish at least once per week fill half of plate with vegetables limit fast food meals to no more than 1 per week manage portion size prepare main meal at home 3 to 5 days each week read food labels for fat, fiber, carbohydrates and portion size keep feet up while sitting wash and dry feet carefully every day wear comfortable, cotton socks wear comfortable, well-fitting shoes check blood pressure weekly choose a place to take my blood pressure (home, clinic or office, retail store) write blood pressure results in a log or diary learn about high blood pressure keep a blood pressure log take blood pressure log to all doctor appointments call doctor for signs and symptoms of high blood pressure develop an action  plan for high blood pressure keep all doctor appointments take medications for blood pressure exactly as prescribed report new symptoms to your doctor eat more whole grains, fruits and vegetables, lean meats and healthy fats - call for medicine refill 2 or 3 days before it runs out - take all medications exactly as prescribed - call doctor with any symptoms you believe are related to your medicine - call doctor when you experience any new symptoms - go to all doctor appointments as scheduled - adhere to prescribed diet: Heart healthy/ADA       Plan:Telephone follow up appointment with care management team member scheduled for:  03-16-2021 at 0900  am  [place], MSN, [facility] Mobile: [phone]

## 2021-01-15 ENCOUNTER — Other Ambulatory Visit: Payer: Self-pay | Admitting: Urology

## 2021-01-17 ENCOUNTER — Other Ambulatory Visit: Payer: Self-pay | Admitting: Student in an Organized Health Care Education/Training Program

## 2021-01-17 DIAGNOSIS — E1141 Type 2 diabetes mellitus with diabetic mononeuropathy: Secondary | ICD-10-CM

## 2021-01-26 ENCOUNTER — Other Ambulatory Visit: Payer: Self-pay | Admitting: Nurse Practitioner

## 2021-01-27 NOTE — Progress Notes (Deleted)
There were no vitals taken for this visit.   Subjective:    Patient ID: Corey [name], male    DOB: 1955/08/01, 65 y.o.   MRN: [id number]  HPI: Corey [name] is a 65 y.o. male  No chief complaint on file.  HYPERTENSION / HYPERLIPIDEMIA Satisfied with current treatment? yes Duration of hypertension: chronic BP monitoring frequency: not checking- will start checking BP range:  BP medication side effects: no Past BP meds: Losartan 81m, Imdur 670mand Metoprolol 2563mDuration of hyperlipidemia: chronic Cholesterol medication side effects: yes Cholesterol supplements: None. Past cholesterol medications: Crestor 45m38medication compliance: excellent compliance Aspirin: yes Recent stressors: no Recurrent headaches: no Visual changes: no Palpitations: no Dyspnea: no Chest pain: no Lower extremity edema: yes- patient states it usually goes down but recently it has been persistent. Dizzy/lightheaded: no   DIABETES Hypoglycemic episodes:yes 2-3 times this week Polydipsia/polyuria: no Visual disturbance: no Chest pain: no Paresthesias: no Glucose Monitoring: yes             Accucheck frequency: Daily             Fasting glucose: 100s             Post prandial:  Taking Insulin?: yes             Long acting insulin: Lantus 85 U daily Blood Pressure Monitoring: not checking Retinal Examination: Not up to Date Foot Exam: Up to Date   Patient states the Cardiologist is sending him to a vein specialist since he is having swelling in his legs and increased pain from his neuropathy.   Relevant past medical, surgical, family and social history reviewed and updated as indicated. Interim medical history since our last visit reviewed. Allergies and medications reviewed and updated.  Review of Systems  Eyes:  Negative for visual disturbance.  Respiratory:  Negative for chest tightness and shortness of breath.   Cardiovascular:  Positive for leg swelling. Negative for chest  pain and palpitations.  Endocrine: Negative for polydipsia and polyuria.  Musculoskeletal:        Leg pain  Neurological:  Negative for dizziness, light-headedness, numbness and headaches.   Per HPI unless specifically indicated above     Objective:    There were no vitals taken for this visit.  Wt Readings from Last 3 Encounters:  12/07/20 218 lb (98.9 kg)  11/16/20 212 lb (96.2 kg)  11/11/20 216 lb (98 kg)    Physical Exam Vitals and nursing note reviewed.  Constitutional:      General: He is not in acute distress.    Appearance: Normal appearance. He is not ill-appearing, toxic-appearing or diaphoretic.  HENT:     Head: Normocephalic.     Right Ear: External ear normal.     Left Ear: External ear normal.     Nose: Nose normal. No congestion or rhinorrhea.     Mouth/Throat:     Mouth: Mucous membranes are moist.  Eyes:     General:        Right eye: No discharge.        Left eye: No discharge.     Extraocular Movements: Extraocular movements intact.     Conjunctiva/sclera: Conjunctivae normal.     Pupils: Pupils are equal, round, and reactive to light.  Cardiovascular:     Rate and Rhythm: Normal rate and regular rhythm.     Heart sounds: No murmur heard. Pulmonary:     Effort: Pulmonary effort is normal. No respiratory  distress.     Breath sounds: Normal breath sounds. No wheezing, rhonchi or rales.  Abdominal:     General: Abdomen is flat. Bowel sounds are normal.  Musculoskeletal:     Cervical back: Normal range of motion and neck supple.     Right lower leg: 1+ Pitting Edema present.     Left lower leg: 1+ Pitting Edema present.  Skin:    General: Skin is warm and dry.     Capillary Refill: Capillary refill takes less than 2 seconds.     Findings: Rash: red, dry, flaky macular rash on bilateral cheeks..  Neurological:     General: No focal deficit present.     Mental Status: He is alert and oriented to person, place, and time.  Psychiatric:        Mood  and Affect: Mood normal.        Behavior: Behavior normal.        Thought Content: Thought content normal.        Judgment: Judgment normal.    Results for orders placed or performed in visit on 10/29/20  Comp Met (CMET)  Result Value Ref Range   Glucose 150 (H) 70 - 99 mg/dL   BUN 19 8 - 27 mg/dL   Creatinine, Ser 1.39 (H) 0.76 - 1.27 mg/dL   eGFR 57 (L) >59 mL/min/1.73   BUN/Creatinine Ratio 14 10 - 24   Sodium 142 134 - 144 mmol/L   Potassium 4.6 3.5 - 5.2 mmol/L   Chloride 101 96 - 106 mmol/L   CO2 23 20 - 29 mmol/L   Calcium 10.0 8.6 - 10.2 mg/dL   Total Protein 7.0 6.0 - 8.5 g/dL   Albumin 4.8 3.8 - 4.8 g/dL   Globulin, Total 2.2 1.5 - 4.5 g/dL   Albumin/Globulin Ratio 2.2 1.2 - 2.2   Bilirubin Total 0.5 0.0 - 1.2 mg/dL   Alkaline Phosphatase 127 (H) 44 - 121 IU/L   AST 40 0 - 40 IU/L   ALT 48 (H) 0 - 44 IU/L  Lipid Profile  Result Value Ref Range   Cholesterol, Total 156 100 - 199 mg/dL   Triglycerides 181 (H) 0 - 149 mg/dL   HDL 38 (L) >39 mg/dL   VLDL Cholesterol Cal 31 5 - 40 mg/dL   LDL Chol Calc (NIH) 87 0 - 99 mg/dL   Chol/HDL Ratio 4.1 0.0 - 5.0 ratio  HgB A1c  Result Value Ref Range   Hgb A1c MFr Bld 7.5 (H) 4.8 - 5.6 %   Est. average glucose Bld gHb Est-mCnc 169 mg/dL      Assessment & Plan:   Problem List Items Addressed This Visit       Cardiovascular and Mediastinum   Hypertension associated with type 2 diabetes mellitus ([place]) - Primary   Coronary artery disease of native artery of native heart with stable angina pectoris ([place])     Endocrine   Hyperlipidemia associated with type 2 diabetes mellitus ([place])   Diabetic peripheral neuropathy associated with type 2 diabetes mellitus ([place])   Type 2 diabetes mellitus with hyperglycemia, with long-term current use of insulin ([place])     Follow up plan: No follow-ups on file.

## 2021-01-27 NOTE — Telephone Encounter (Signed)
Requested Prescriptions  Pending Prescriptions Disp Refills   cetirizine (ZYRTEC) 10 MG tablet [Pharmacy Med Name: CETIRIZINE 10MG  TABLETS] 90 tablet 1    Sig: TAKE 1 TABLET BY MOUTH DAILY     Ear, Nose, and Throat:  Antihistamines Passed - 01/26/2021 11:23 AM      Passed - Valid encounter within last 12 months    Recent Outpatient Visits          3 months ago Hypertension associated with type 2 diabetes mellitus (HCC)   [facility], NP   6 months ago Hypertension associated with type 2 diabetes mellitus (HCC)   [facility], NP   6 months ago Type 2 diabetes mellitus with hyperglycemia, with long-term current use of insulin (HCC)   [facility] [place], Megan P, DO   9 months ago Coronary artery disease of native artery of native heart with stable angina pectoris (HCC)   [facility], NP   10 months ago Type 2 diabetes mellitus with hyperglycemia, with long-term current use of insulin (HCC)   [facility] [place], [place], NP      Future Appointments            Tomorrow Dorie Rank, NP [facility]   In 4 weeks [name], MD  [facility]   In 7 months Stoioff, Deirdre Evener, MD [facility]   In 9 months  [facility]           .

## 2021-01-28 ENCOUNTER — Ambulatory Visit: Payer: Medicare Other | Admitting: Nurse Practitioner

## 2021-02-03 NOTE — Progress Notes (Signed)
BP (!) 103/55    Pulse 87    Temp 99.1 F (37.3 C) (Oral)    Ht 5' 7"  (1.702 m)    Wt 216 lb (98 kg)    SpO2 95%    BMI 33.83 kg/m    Subjective:    Patient ID: Corey [name], male    DOB: May 12, 1955, 66 y.o.   MRN: [id number]  HPI: Corey [name] is a 66 y.o. male  Chief Complaint  Patient presents with   Diabetes   Hyperlipidemia   Hypertension   HYPERTENSION / HYPERLIPIDEMIA Satisfied with current treatment? yes Duration of hypertension: chronic BP monitoring frequency: daily BP range: 120-130/50 BP medication side effects: no Past BP meds: Losartan 50m, Imdur 652mand Metoprolol 2548mDuration of hyperlipidemia: chronic Cholesterol medication side effects: yes Cholesterol supplements: None. Past cholesterol medications: Crestor 58m63medication compliance: excellent compliance Aspirin: yes Recent stressors: no Recurrent headaches: no Visual changes: no Palpitations: no Dyspnea: no Chest pain: no Lower extremity edema: yes- patient states it usually goes down but recently it has been persistent. Dizzy/lightheaded: no   DIABETES Hypoglycemic episodes:one Polydipsia/polyuria: no Visual disturbance: no Chest pain: no Paresthesias: no Glucose Monitoring: yes             Accucheck frequency: Daily             Fasting glucose: 110             Post prandial:  Taking Insulin?: yes             Long acting insulin: Lantus 70 U daily Blood Pressure Monitoring: not checking Retinal Examination: Not up to Date Foot Exam: Up to Date   Patient states the Vascular specialist gave him compression stockings that he uses twice a day.  States he feels like it is helping his neuropathy and the swelling in his legs.    Relevant past medical, surgical, family and social history reviewed and updated as indicated. Interim medical history since our last visit reviewed. Allergies and medications reviewed and updated.  Review of Systems  Eyes:  Negative for visual  disturbance.  Respiratory:  Negative for chest tightness and shortness of breath.   Cardiovascular:  Negative for chest pain, palpitations and leg swelling.  Endocrine: Negative for polydipsia and polyuria.  Musculoskeletal:        Leg pain  Neurological:  Negative for dizziness, light-headedness, numbness and headaches.   Per HPI unless specifically indicated above     Objective:    BP (!) 103/55    Pulse 87    Temp 99.1 F (37.3 C) (Oral)    Ht 5' 7"  (1.702 m)    Wt 216 lb (98 kg)    SpO2 95%    BMI 33.83 kg/m   Wt Readings from Last 3 Encounters:  02/04/21 216 lb (98 kg)  12/07/20 218 lb (98.9 kg)  11/16/20 212 lb (96.2 kg)    Physical Exam Vitals and nursing note reviewed.  Constitutional:      General: He is not in acute distress.    Appearance: Normal appearance. He is not ill-appearing, toxic-appearing or diaphoretic.  HENT:     Head: Normocephalic.     Right Ear: External ear normal.     Left Ear: External ear normal.     Nose: Nose normal. No congestion or rhinorrhea.     Mouth/Throat:     Mouth: Mucous membranes are moist.  Eyes:     General:  Right eye: No discharge.        Left eye: No discharge.     Extraocular Movements: Extraocular movements intact.     Conjunctiva/sclera: Conjunctivae normal.     Pupils: Pupils are equal, round, and reactive to light.  Cardiovascular:     Rate and Rhythm: Normal rate and regular rhythm.     Heart sounds: No murmur heard. Pulmonary:     Effort: Pulmonary effort is normal. No respiratory distress.     Breath sounds: Normal breath sounds. No wheezing, rhonchi or rales.  Abdominal:     General: Abdomen is flat. Bowel sounds are normal.  Musculoskeletal:     Cervical back: Normal range of motion and neck supple.  Skin:    General: Skin is warm and dry.     Capillary Refill: Capillary refill takes less than 2 seconds.     Findings: Rash: red, dry, flaky macular rash on bilateral cheeks..  Neurological:      General: No focal deficit present.     Mental Status: He is alert and oriented to person, place, and time.  Psychiatric:        Mood and Affect: Mood normal.        Behavior: Behavior normal.        Thought Content: Thought content normal.        Judgment: Judgment normal.    Results for orders placed or performed in visit on 10/29/20  Comp Met (CMET)  Result Value Ref Range   Glucose 150 (H) 70 - 99 mg/dL   BUN 19 8 - 27 mg/dL   Creatinine, Ser 1.39 (H) 0.76 - 1.27 mg/dL   eGFR 57 (L) >59 mL/min/1.73   BUN/Creatinine Ratio 14 10 - 24   Sodium 142 134 - 144 mmol/L   Potassium 4.6 3.5 - 5.2 mmol/L   Chloride 101 96 - 106 mmol/L   CO2 23 20 - 29 mmol/L   Calcium 10.0 8.6 - 10.2 mg/dL   Total Protein 7.0 6.0 - 8.5 g/dL   Albumin 4.8 3.8 - 4.8 g/dL   Globulin, Total 2.2 1.5 - 4.5 g/dL   Albumin/Globulin Ratio 2.2 1.2 - 2.2   Bilirubin Total 0.5 0.0 - 1.2 mg/dL   Alkaline Phosphatase 127 (H) 44 - 121 IU/L   AST 40 0 - 40 IU/L   ALT 48 (H) 0 - 44 IU/L  Lipid Profile  Result Value Ref Range   Cholesterol, Total 156 100 - 199 mg/dL   Triglycerides 181 (H) 0 - 149 mg/dL   HDL 38 (L) >39 mg/dL   VLDL Cholesterol Cal 31 5 - 40 mg/dL   LDL Chol Calc (NIH) 87 0 - 99 mg/dL   Chol/HDL Ratio 4.1 0.0 - 5.0 ratio  HgB A1c  Result Value Ref Range   Hgb A1c MFr Bld 7.5 (H) 4.8 - 5.6 %   Est. average glucose Bld gHb Est-mCnc 169 mg/dL      Assessment & Plan:   Problem List Items Addressed This Visit       Cardiovascular and Mediastinum   Hypertension associated with type 2 diabetes mellitus ([place]) - Primary    Chronic.  Controlled.  Continue with current medication regimen of Imdur 58m daily, Metoprolol 220mdaily, and Losartan 253m Labs ordered today.  Continue checking blood pressures at home. Return to clinic in 3 months for reevaluation.  Call sooner if concerns arise.        Relevant Orders   Comp Met (CMET)  Coronary artery disease of native artery of native heart with  stable angina pectoris (HCC)    Chronic.  Stable.  Continue to follow up with Cardiology.  Continue on Crestor 54m daily.  Follow up in 3 months.  Call sooner if concerns arise.       Relevant Orders   Comp Met (CMET)     Endocrine   Hyperlipidemia associated with type 2 diabetes mellitus (HCC)    Chronic.  Controlled.  Continue with current medication regimen of Crestor 242mand Plavix 7572m Will draw labs at next visit.  Return to clinic in 3 months for reevaluation.  Continue to follow up with Cardiology. Call sooner if concerns arise.        [facility]  Patient is seeing vascular. Using compression pumps BID.  Working well.  Continue to follow up with vascular.       Type 2 diabetes mellitus with hyperglycemia, with long-term current use of insulin (HCC)    Chronic.  Patient is having some hypoglycemia.  If A1c is well controlled, will decrease Lantus to 60 U daily.  Labs ordered today.  Will let patient know what change will be made to his medications after lab results return.  FreShawn Routedered for patient during visit. Return to clinic in 3 months for reevaluation.  Call sooner if concerns arise.        Relevant Orders   HgB A1c     Other   Major depression   Other Visit Diagnoses     B12 deficiency       Relevant Orders   B12        Follow up plan: Return in about 3 months (around 05/05/2021) for HTN, HLD, DM2 FU.

## 2021-02-04 ENCOUNTER — Ambulatory Visit (INDEPENDENT_AMBULATORY_CARE_PROVIDER_SITE_OTHER): Payer: Medicare Other | Admitting: Nurse Practitioner

## 2021-02-04 ENCOUNTER — Encounter: Payer: Self-pay | Admitting: Nurse Practitioner

## 2021-02-04 ENCOUNTER — Other Ambulatory Visit: Payer: Self-pay

## 2021-02-04 VITALS — BP 103/55 | HR 87 | Temp 99.1°F | Ht 67.0 in | Wt 216.0 lb

## 2021-02-04 DIAGNOSIS — F3341 Major depressive disorder, recurrent, in partial remission: Secondary | ICD-10-CM

## 2021-02-04 DIAGNOSIS — E1159 Type 2 diabetes mellitus with other circulatory complications: Secondary | ICD-10-CM

## 2021-02-04 DIAGNOSIS — E1169 Type 2 diabetes mellitus with other specified complication: Secondary | ICD-10-CM

## 2021-02-04 DIAGNOSIS — I152 Hypertension secondary to endocrine disorders: Secondary | ICD-10-CM

## 2021-02-04 DIAGNOSIS — E785 Hyperlipidemia, unspecified: Secondary | ICD-10-CM

## 2021-02-04 DIAGNOSIS — E1142 Type 2 diabetes mellitus with diabetic polyneuropathy: Secondary | ICD-10-CM

## 2021-02-04 DIAGNOSIS — E1165 Type 2 diabetes mellitus with hyperglycemia: Secondary | ICD-10-CM

## 2021-02-04 DIAGNOSIS — I25118 Atherosclerotic heart disease of native coronary artery with other forms of angina pectoris: Secondary | ICD-10-CM

## 2021-02-04 DIAGNOSIS — E538 Deficiency of other specified B group vitamins: Secondary | ICD-10-CM

## 2021-02-04 DIAGNOSIS — Z794 Long term (current) use of insulin: Secondary | ICD-10-CM

## 2021-02-04 MED ORDER — FREESTYLE LIBRE 14 DAY READER DEVI: 1.0000 [IU] | Freq: Four times a day (QID) | 0 refills | Status: AC

## 2021-02-04 MED ORDER — FREESTYLE LIBRE 14 DAY SENSOR MISC: 1.0000 [IU] | 1 refills | Status: AC

## 2021-02-04 NOTE — Assessment & Plan Note (Signed)
Chronic.  Stable.  Continue to follow up with Cardiology.  Continue on Crestor 20mg  daily.  Follow up in 3 months.  Call sooner if concerns arise.

## 2021-02-04 NOTE — Assessment & Plan Note (Signed)
Patient is seeing vascular. Using compression pumps BID.  Working well.  Continue to follow up with vascular.

## 2021-02-04 NOTE — Assessment & Plan Note (Signed)
Chronic.  Controlled.  Continue with current medication regimen of Crestor 20mg  and Plavix 75mg .  Will draw labs at next visit.  Return to clinic in 3 months for reevaluation.  Continue to follow up with Cardiology. Call sooner if concerns arise.

## 2021-02-04 NOTE — Assessment & Plan Note (Signed)
Chronic.  Patient is having some hypoglycemia.  If A1c is well controlled, will decrease Lantus to 60 U daily.  Labs ordered today.  Will let patient know what change will be made to his medications after lab results return.  Tommie Sams ordered for patient during visit. Return to clinic in 3 months for reevaluation.  Call sooner if concerns arise.

## 2021-02-04 NOTE — Assessment & Plan Note (Signed)
Chronic.  Controlled.  Continue with current medication regimen of Imdur 60mg  daily, Metoprolol 25mg  daily, and Losartan 25mg .  Labs ordered today.  Continue checking blood pressures at home. Return to clinic in 3 months for reevaluation.  Call sooner if concerns arise.

## 2021-02-05 LAB — HEMOGLOBIN A1C
Est. average glucose Bld gHb Est-mCnc: 180 mg/dL
Hgb A1c MFr Bld: 7.9 % — ABNORMAL HIGH (ref 4.8–5.6)

## 2021-02-05 LAB — COMPREHENSIVE METABOLIC PANEL
ALT: 39 IU/L (ref 0–44)
AST: 28 IU/L (ref 0–40)
Albumin/Globulin Ratio: 2.3 — ABNORMAL HIGH (ref 1.2–2.2)
Albumin: 4.4 g/dL (ref 3.8–4.8)
Alkaline Phosphatase: 102 IU/L (ref 44–121)
BUN/Creatinine Ratio: 10 (ref 10–24)
BUN: 15 mg/dL (ref 8–27)
Bilirubin Total: 0.4 mg/dL (ref 0.0–1.2)
CO2: 24 mmol/L (ref 20–29)
Calcium: 9.1 mg/dL (ref 8.6–10.2)
Chloride: 102 mmol/L (ref 96–106)
Creatinine, Ser: 1.45 mg/dL — ABNORMAL HIGH (ref 0.76–1.27)
Globulin, Total: 1.9 g/dL (ref 1.5–4.5)
Glucose: 141 mg/dL — ABNORMAL HIGH (ref 70–99)
Potassium: 3.8 mmol/L (ref 3.5–5.2)
Sodium: 140 mmol/L (ref 134–144)
Total Protein: 6.3 g/dL (ref 6.0–8.5)
eGFR: 54 mL/min/{1.73_m2} — ABNORMAL LOW (ref >59)

## 2021-02-05 LAB — VITAMIN B12: Vitamin B-12: 1716 pg/mL — ABNORMAL HIGH (ref 232–1245)

## 2021-02-05 NOTE — Progress Notes (Signed)
Please let patient know that his A1c increased slightly to 7.9.  We will continue with the 70U of insulin.  Otherwise lab work looks good.  Please let me know if he has any questions.

## 2021-02-07 IMAGING — CR DG KNEE 1-2V*R*
1 series · 2 of 2 positions shown · non-contrast
Comparison: None.

CLINICAL DATA: Chronic bilateral hip pain.

EXAM:
RIGHT KNEE - 1-2 VIEW

[Series 1: dg knee 1-2 views right · 0.14mm/px · 2 of 2 slices shown]
[im 1/2]
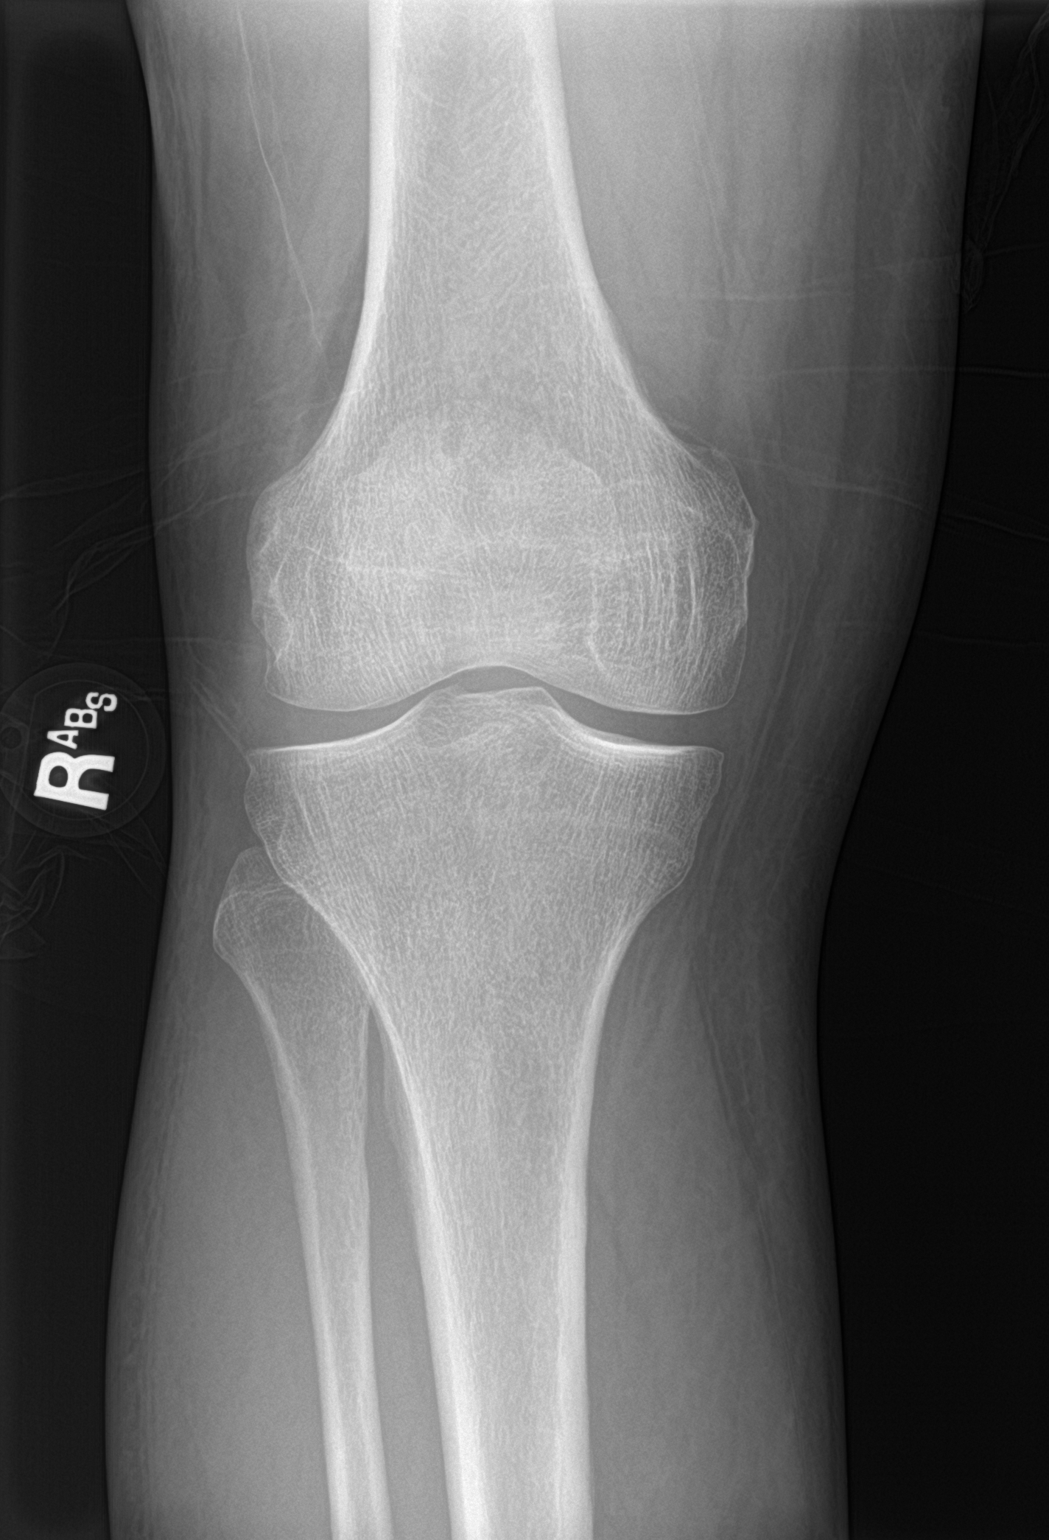
[im 2/2]
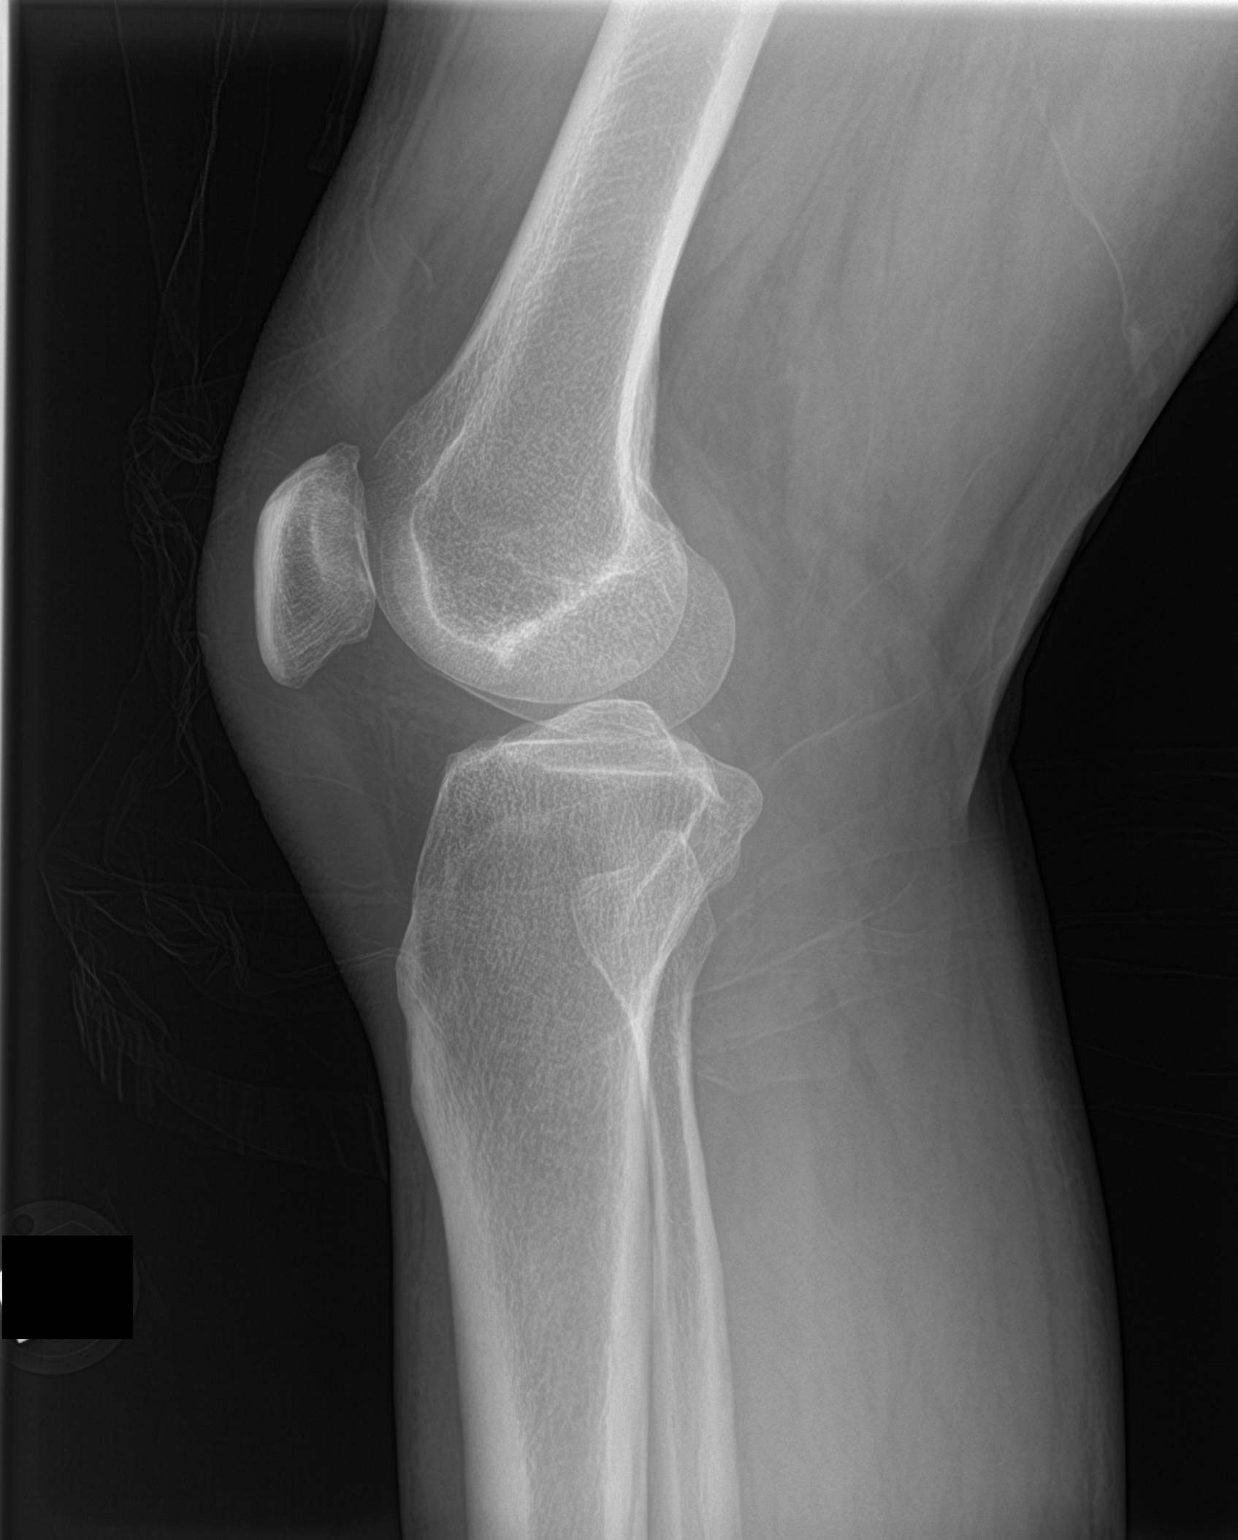

[2 of 2 positions shown; findings below may reference images not displayed]

FINDINGS: Minimal posterior patellar spur formation. Otherwise, normal
appearing bones and soft tissues. No effusion.
IMPRESSION: Minimal patellofemoral degenerative changes.

## 2021-02-07 IMAGING — CR DG HIP (WITH OR WITHOUT PELVIS) 2-3V*R*
1 series · 3 of 3 positions shown · non-contrast
Comparison: No prior.

CLINICAL DATA: Right hip pain.  Arthralgia.

EXAM:
DG HIP (WITH OR WITHOUT PELVIS) 2-3V RIGHT

[Series 1: dg hip unilat w or w/o pelvis 2-3 views  · non-contrast · 0.14mm/px · 3 of 3 slices shown]
[im 1/3]
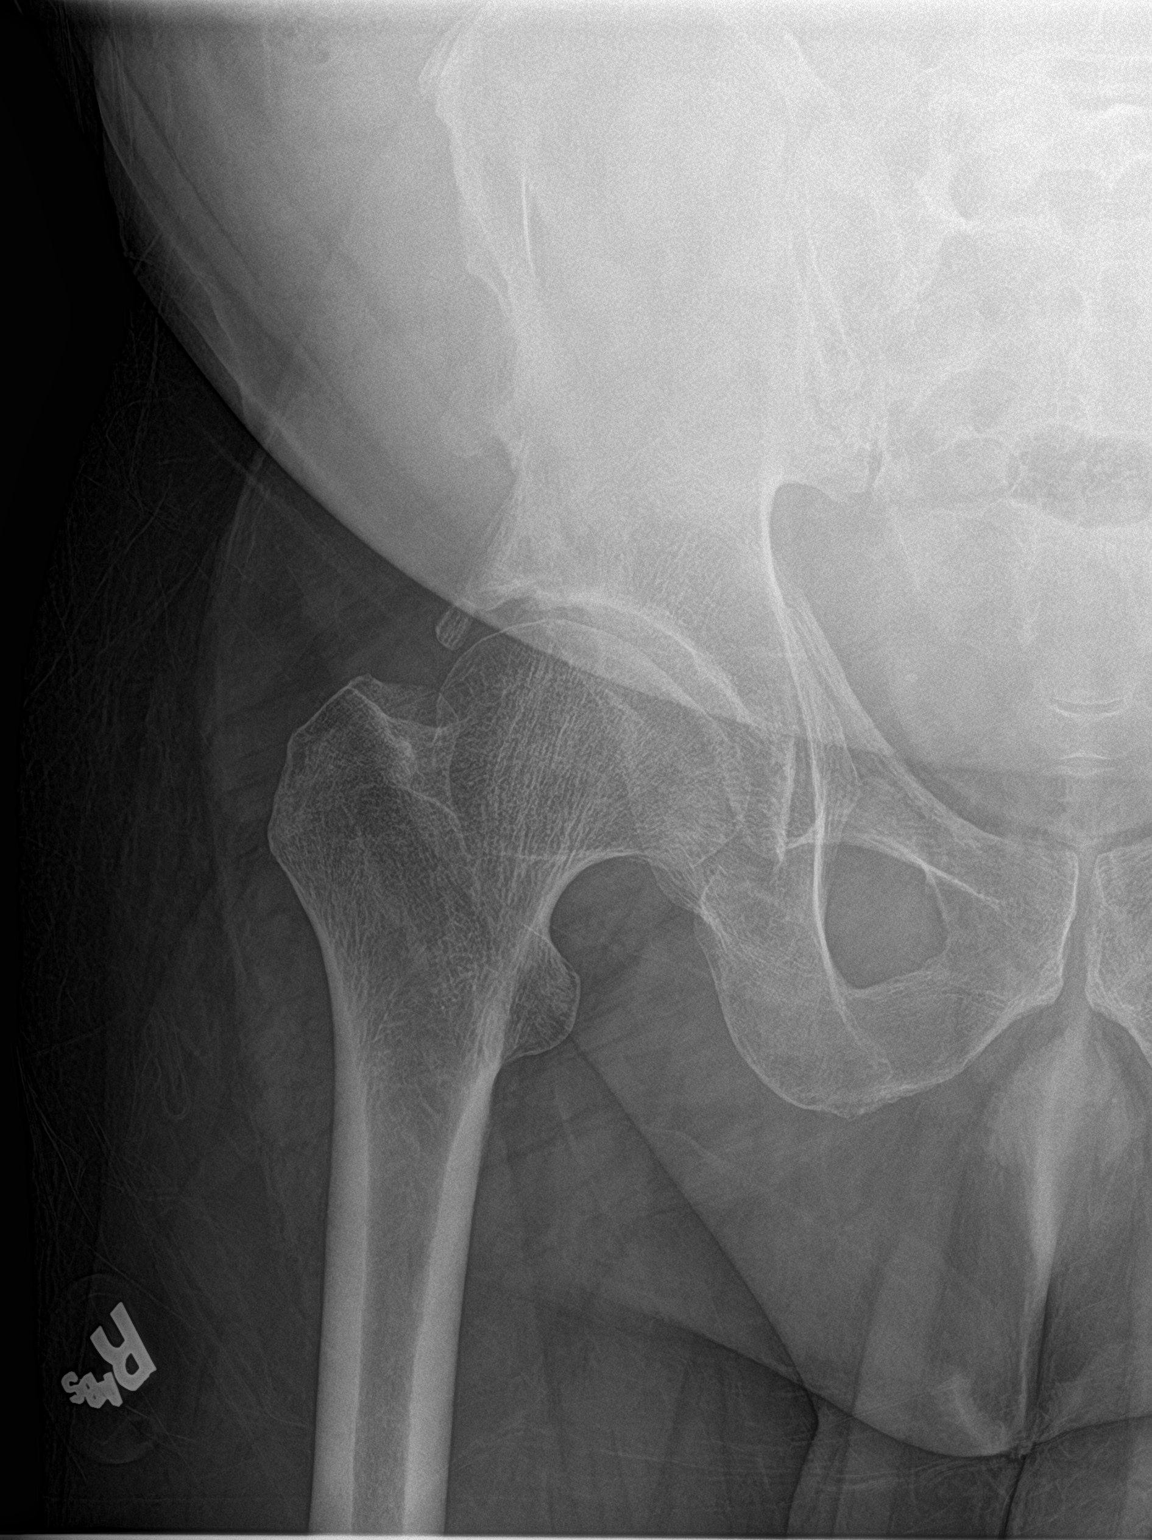
[im 2/3]
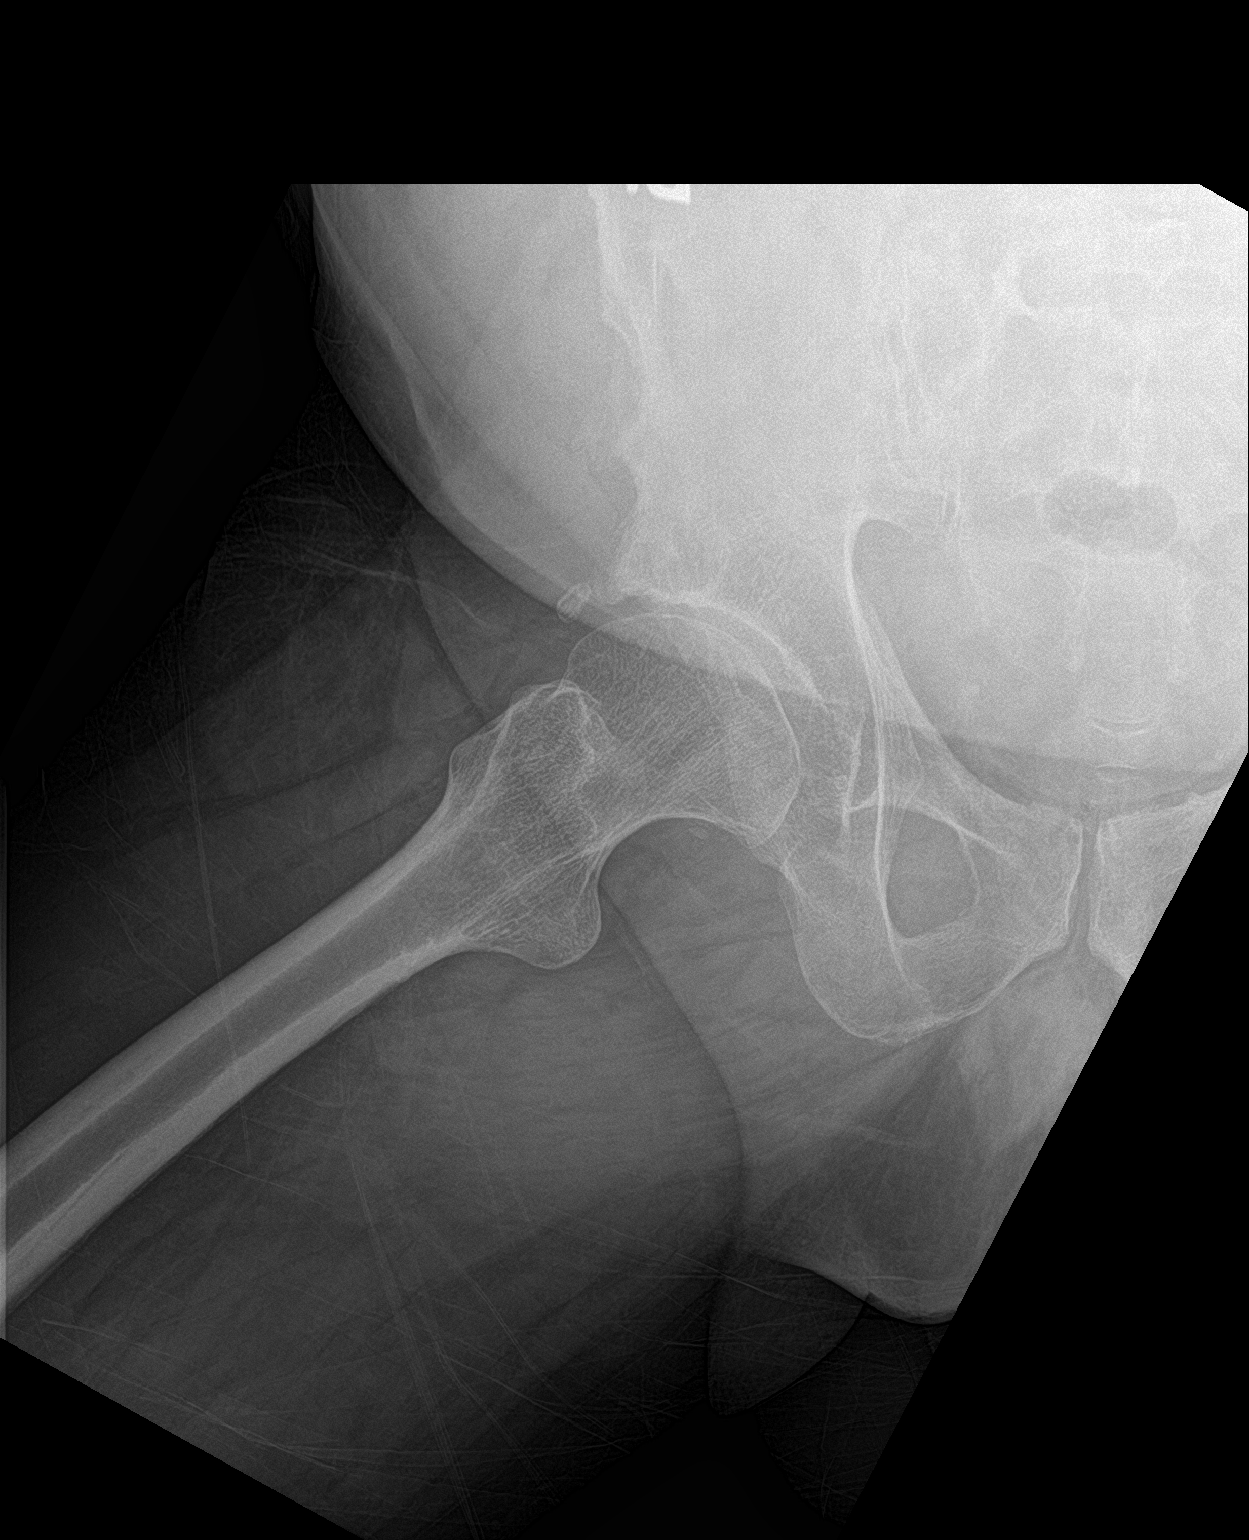
[im 3/3]
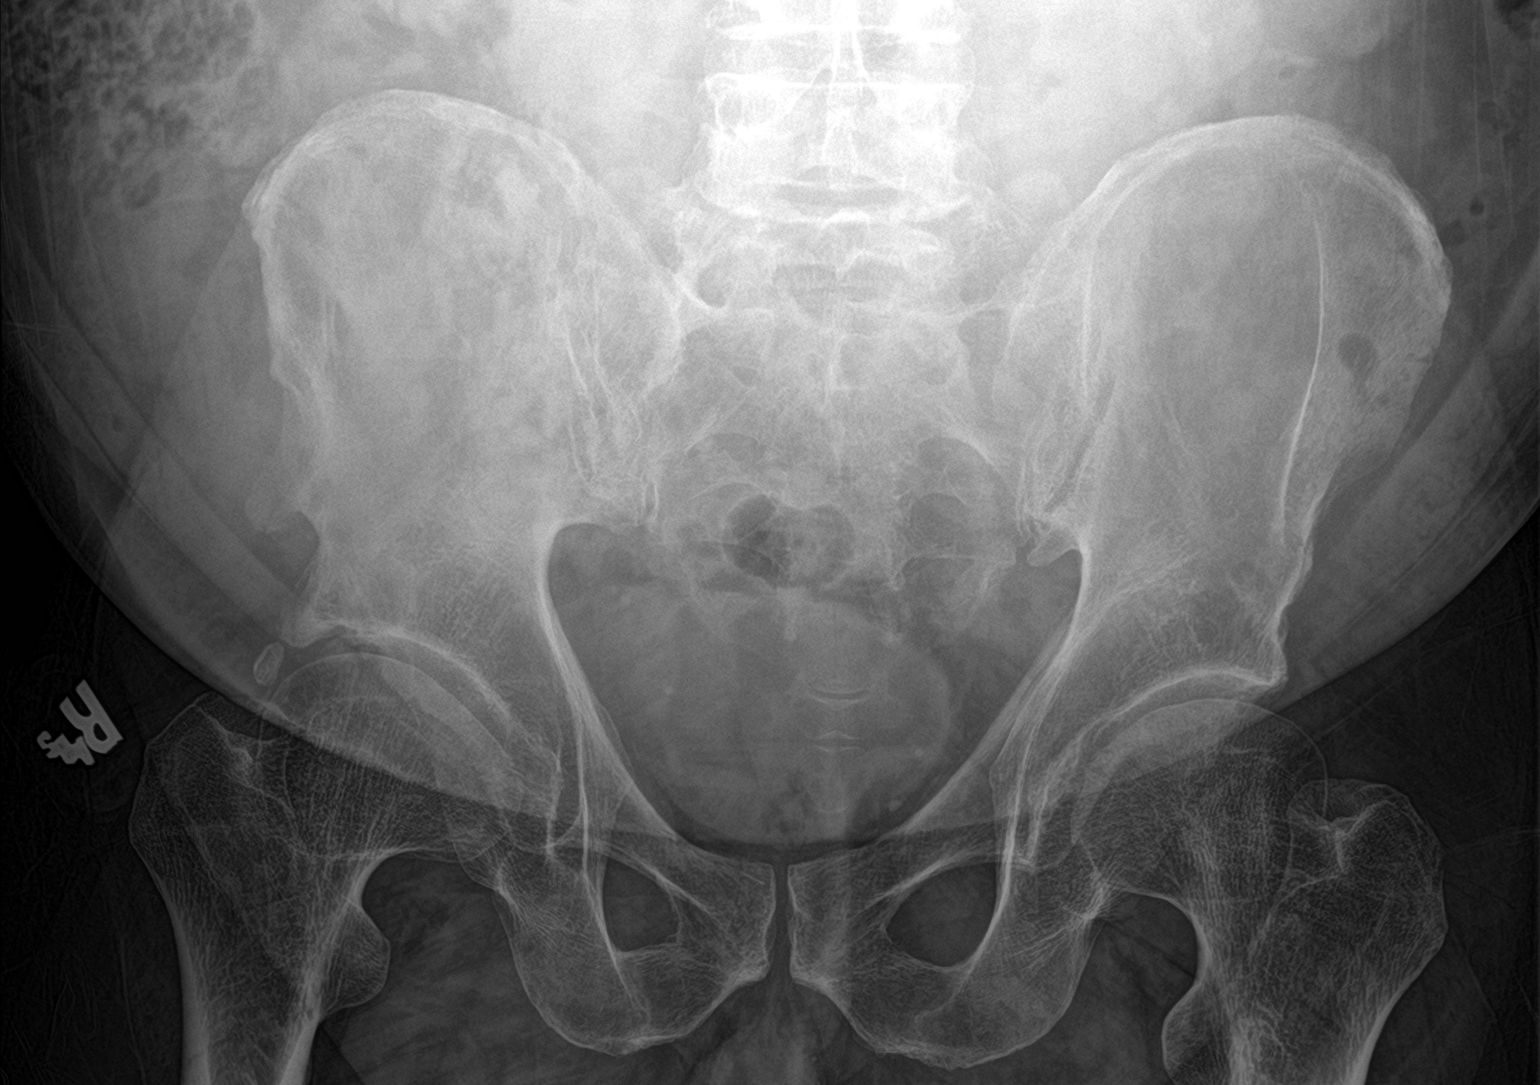

[3 of 3 positions shown; findings below may reference images not displayed]

FINDINGS: Degenerative changes lumbar spine and both hips. Deformity noted
about both hips and proximal femurs, most likely developmental.
Corticated bony density noted adjacent to the right hip possibly
related to old injury. No acute bony abnormality identified. No
evidence of fracture or dislocation. Pelvic calcifications
consistent with phleboliths.
IMPRESSION: Degenerative changes lumbar spine and both hips. Deformity noted
about both hips and proximal femurs, most likely developmental. No
acute bony or joint abnormality identified.

## 2021-02-07 IMAGING — CR DG HIP (WITH OR WITHOUT PELVIS) 2-3V*L*
1 series · 3 of 3 positions shown · non-contrast
Comparison: None.

CLINICAL DATA: Chronic low back and bilateral hip pain.

EXAM:
DG HIP (WITH OR WITHOUT PELVIS) 2-3V LEFT

[Series 1: dg hip unilat w or w/o pelvis 2-3 views  · non-contrast · 0.14mm/px · 3 of 3 slices shown]
[im 1/3]
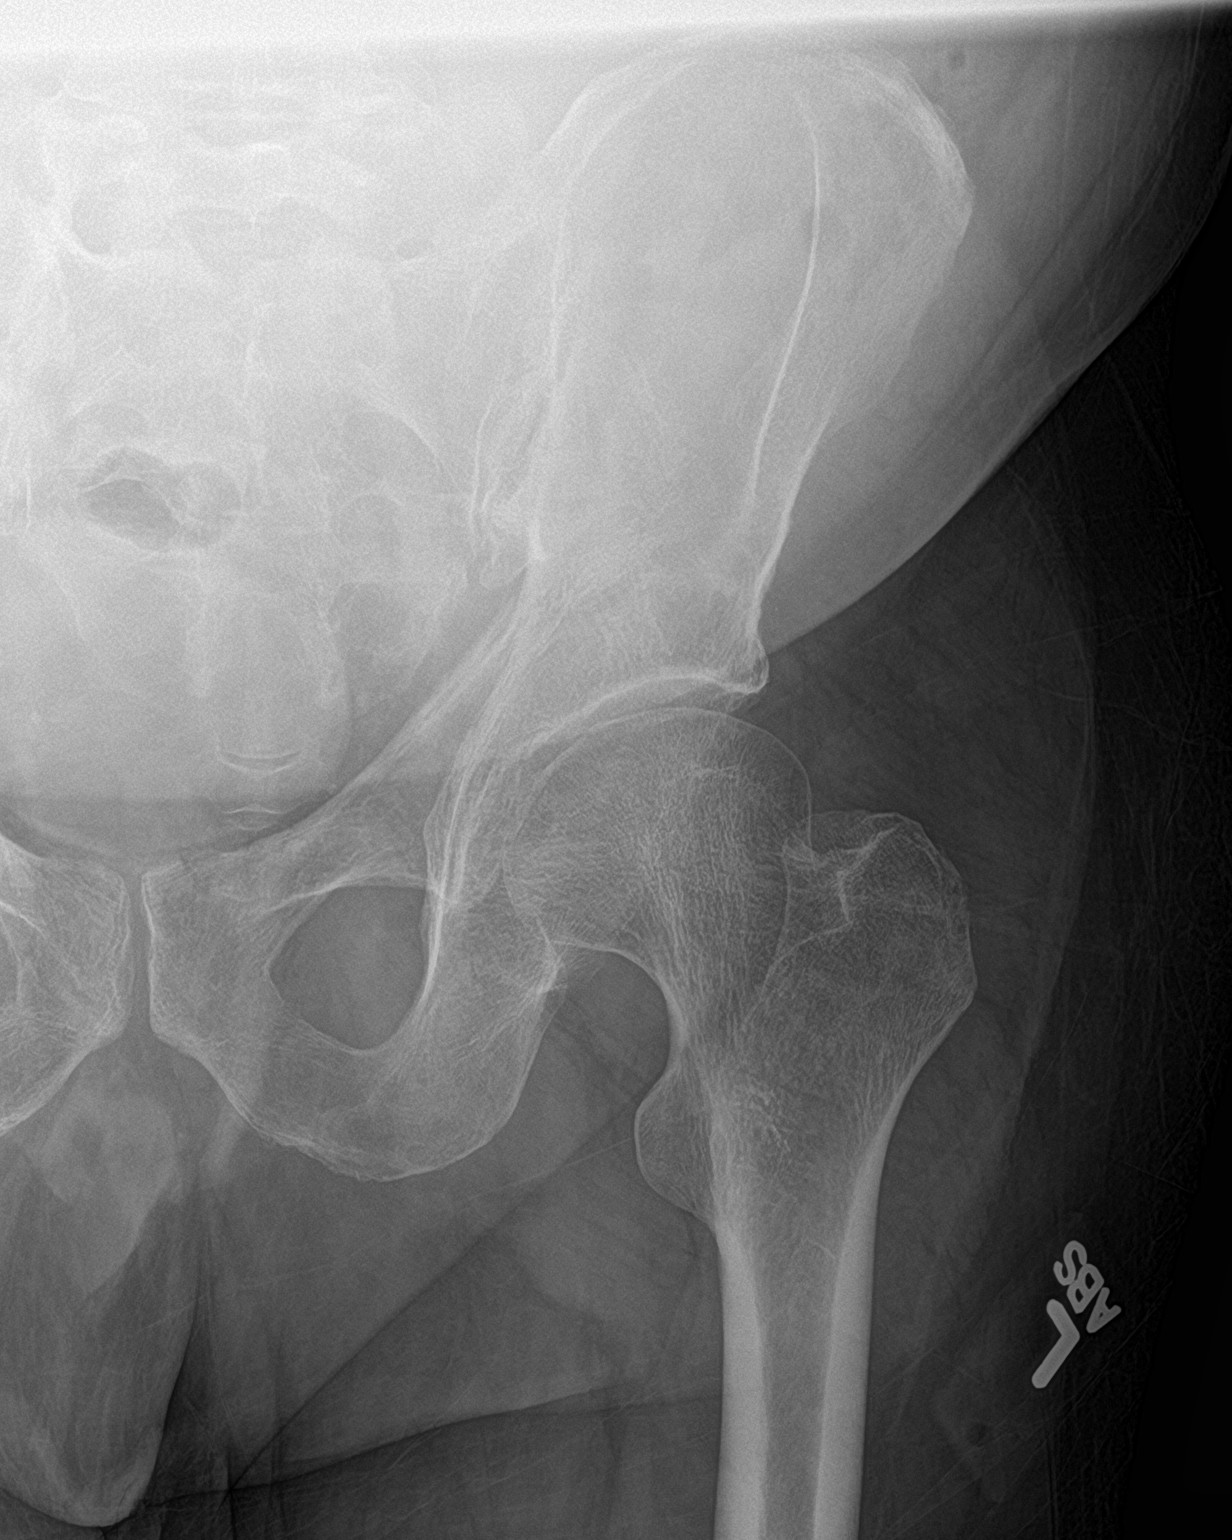
[im 2/3]
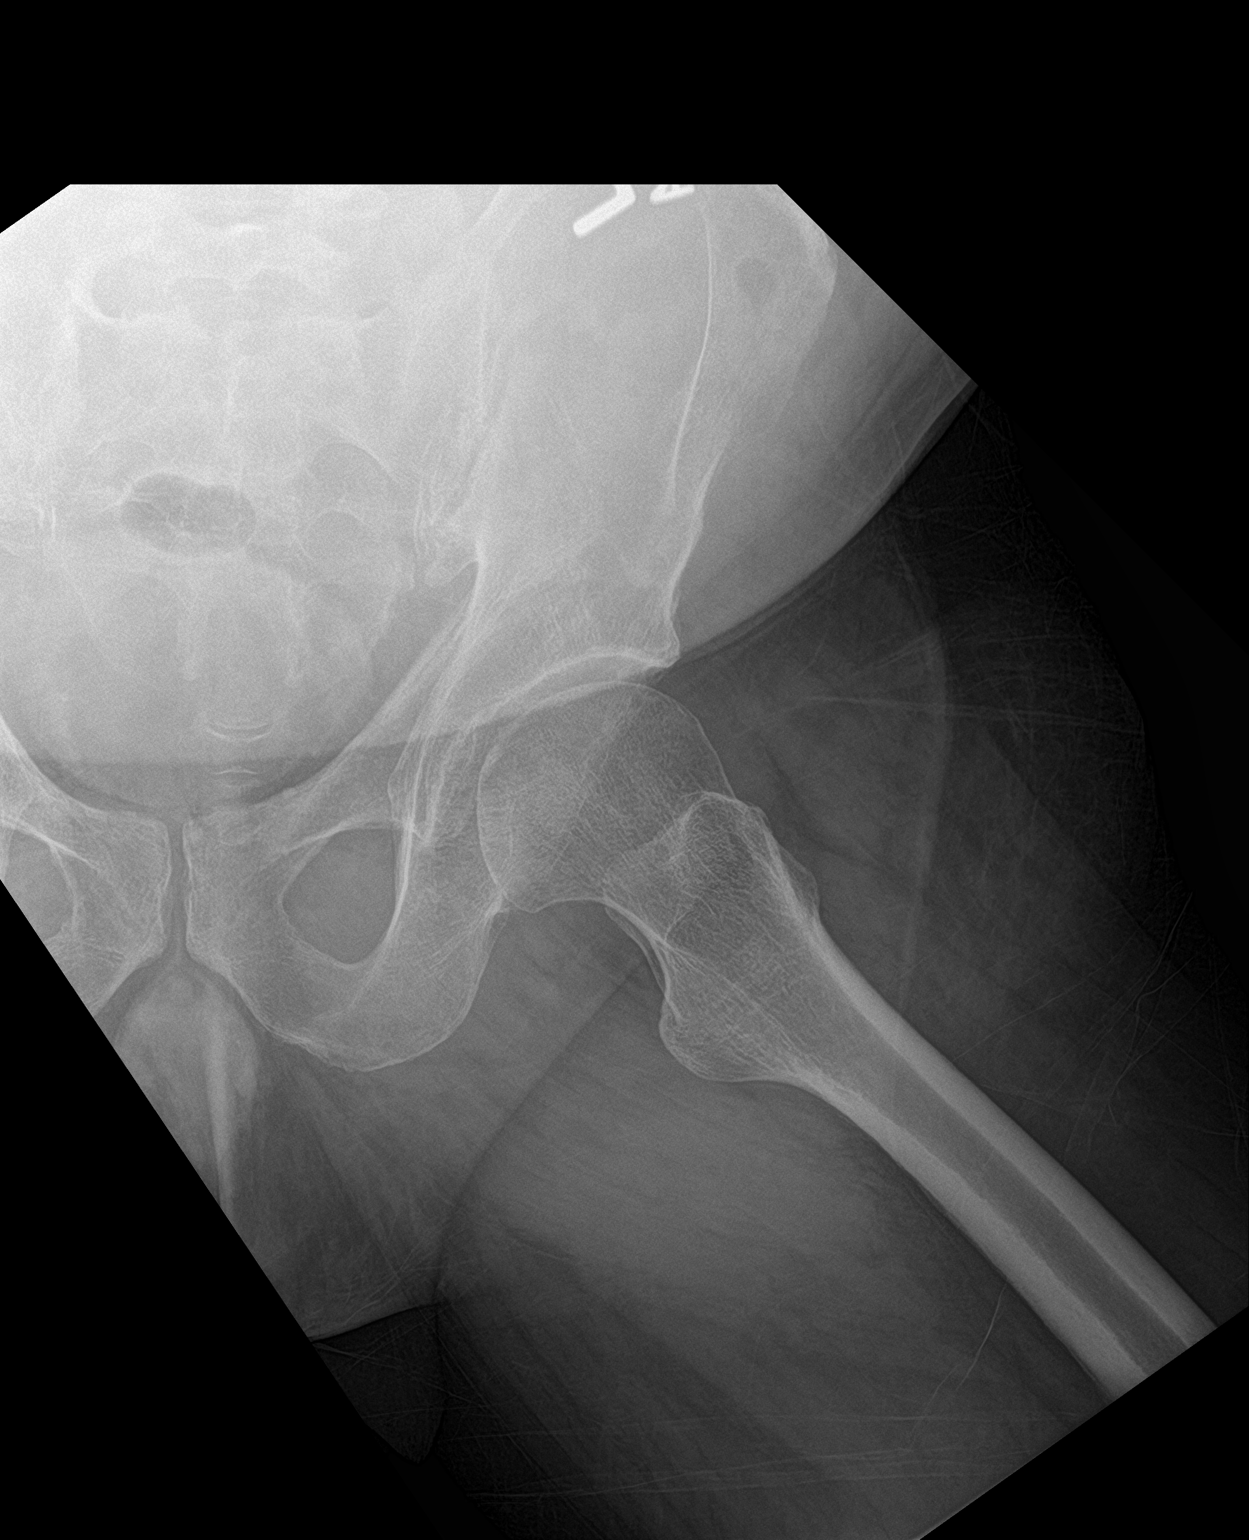
[im 3/3]
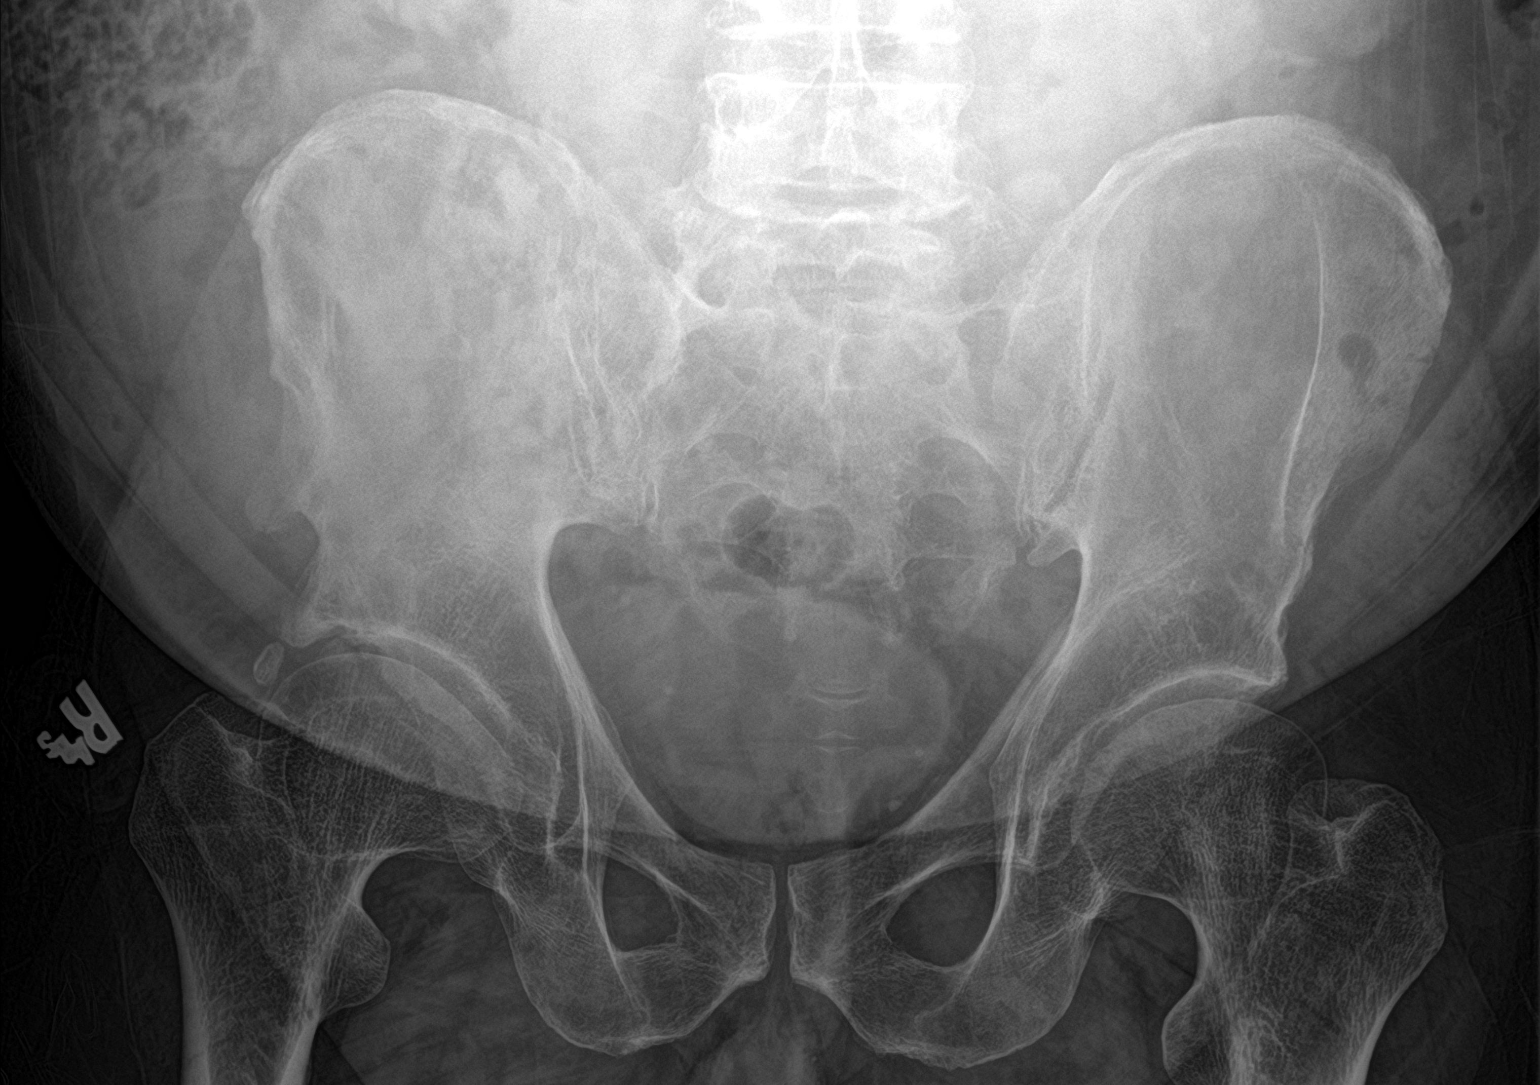

[3 of 3 positions shown; findings below may reference images not displayed]

FINDINGS: Deformity of both femoral heads with an appearance suggesting old,
healed bilateral slipped capital femoral epiphyses. No fracture or
dislocation seen. Diffuse osteopenia.
IMPRESSION: Probable old, healed bilateral slipped capital femoral epiphyses. No
acute abnormality.

## 2021-02-07 IMAGING — CR DG LUMBAR SPINE COMPLETE W/ BEND
1 series · 7 of 7 positions shown · non-contrast
Comparison: None.

CLINICAL DATA: Chronic low back pain.

EXAM:
LUMBAR SPINE - COMPLETE WITH BENDING VIEWS

[Series 1: dg lumbar spine complete w/bend 6+v · 0.14mm/px · 7 of 7 slices shown]
[im 1/7]
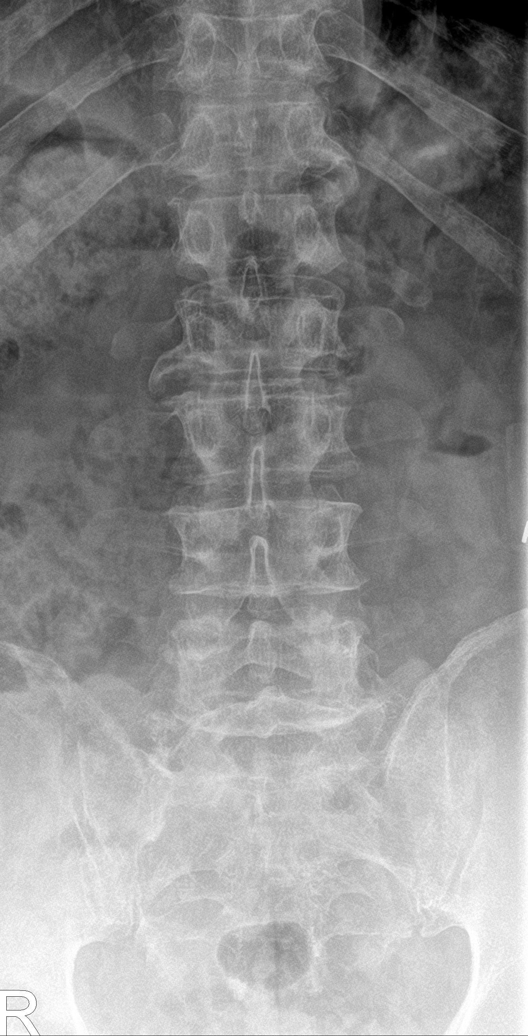
[im 2/7]
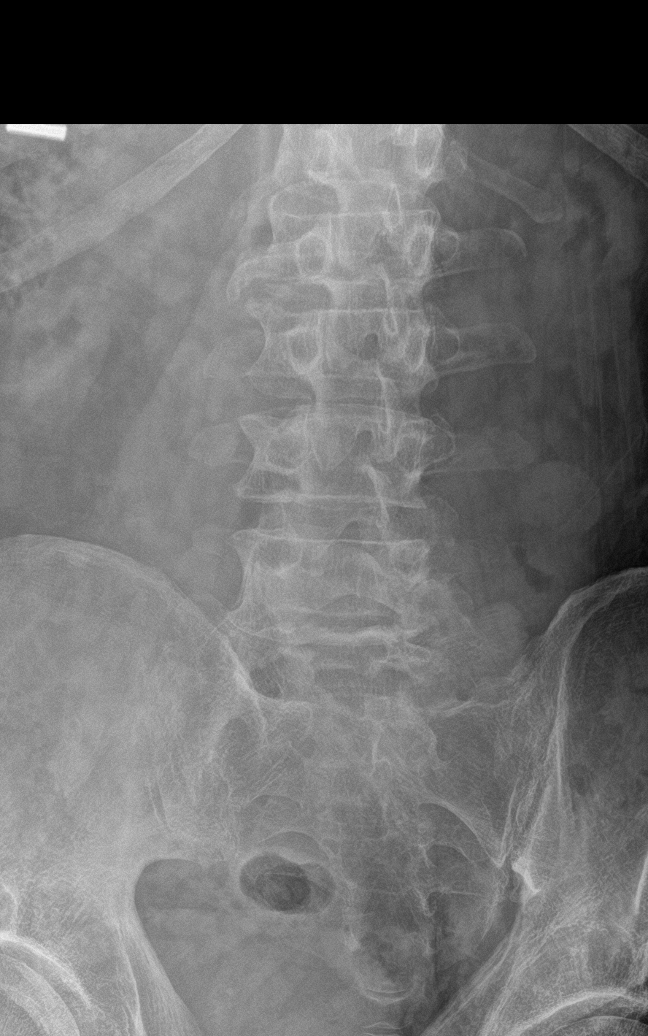
[im 3/7]
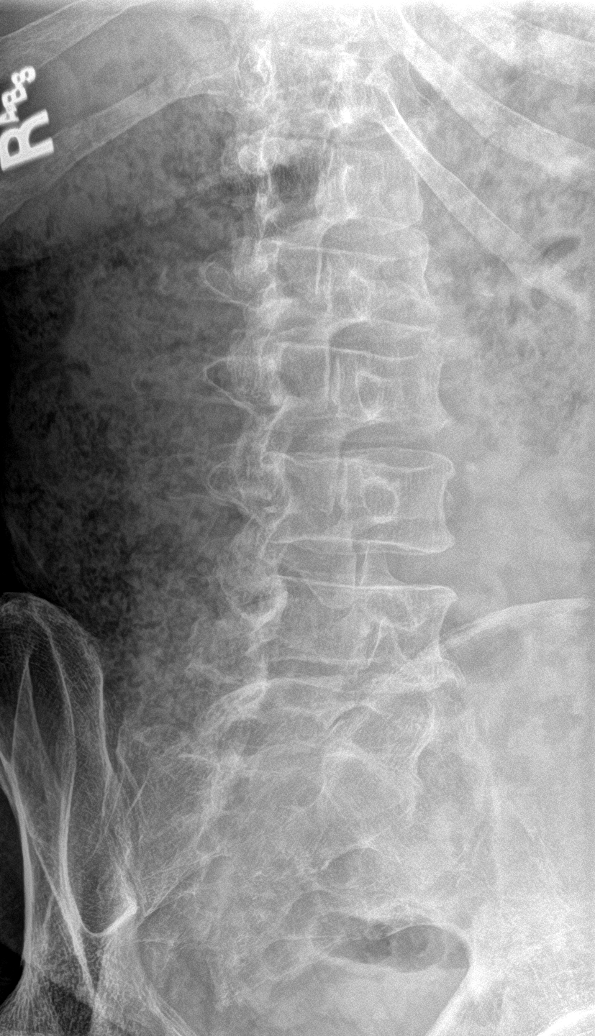
[im 4/7]
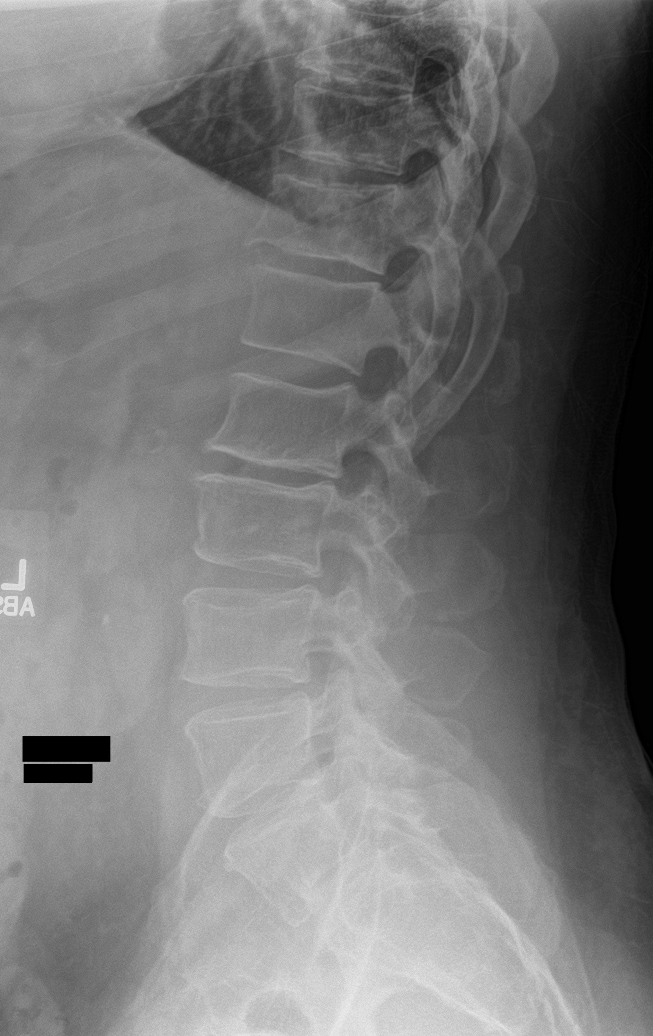
[im 5/7]
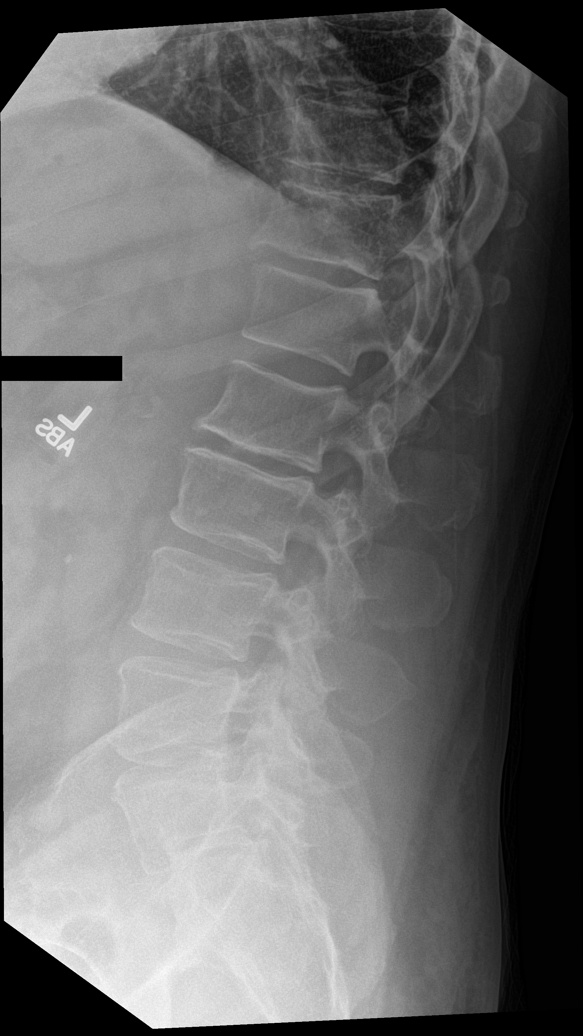
[im 6/7]
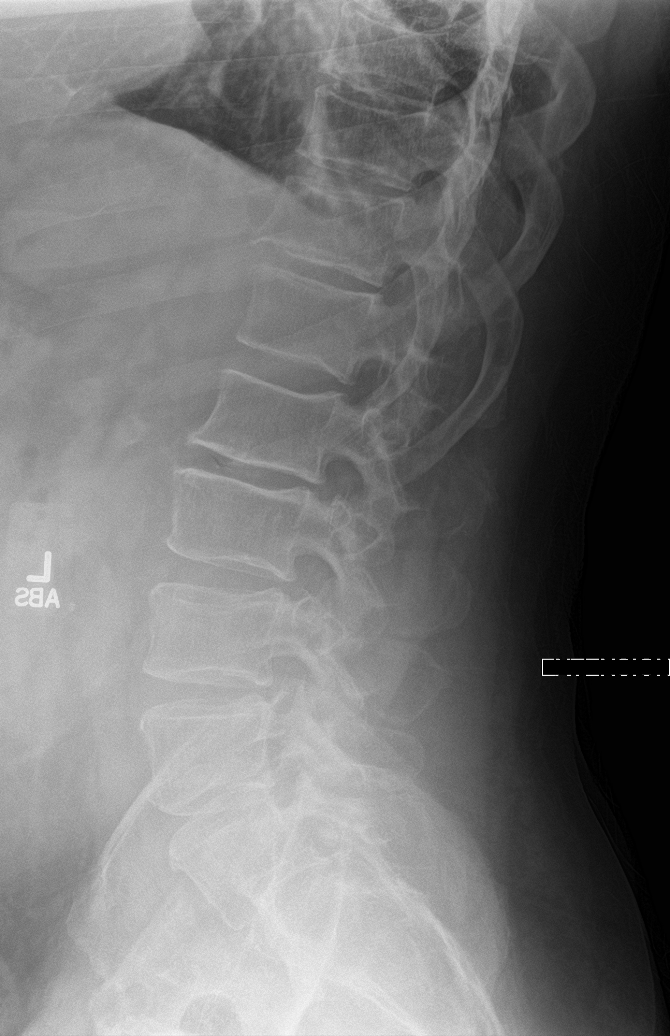
[im 7/7]
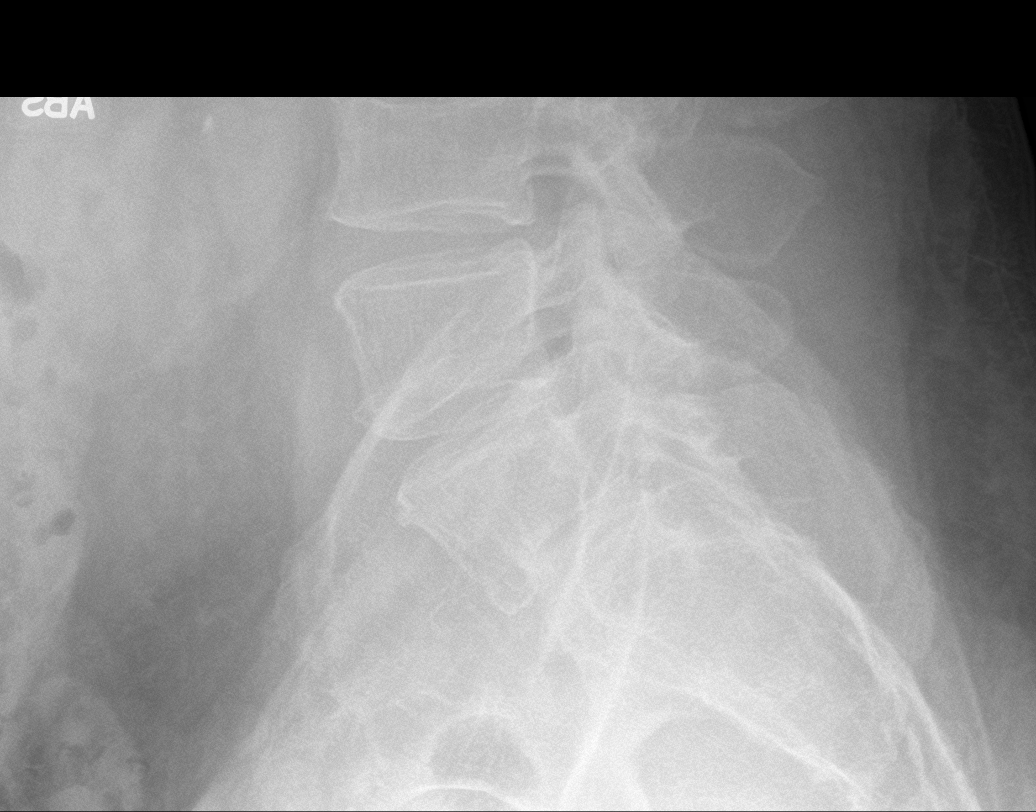

[7 of 7 positions shown; findings below may reference images not displayed]

FINDINGS: Five non-rib-bearing lumbar vertebrae. Moderate to large-sized right
lateral spurs at the L2-3 level. Mild moderate anterior spur
formation at the T12-L1 and L2-3 levels. Minimal anterior spur
formation at the L4-5 level. No fractures, pars defects or
subluxations, including flexion-extension views. Large amount of
stool in the right and transverse colon.
IMPRESSION: 1. Degenerative changes, as described above.
2. Prominent stool.

## 2021-02-07 IMAGING — CR DG SI JOINTS 3+V
1 series · 3 of 3 positions shown · non-contrast
Comparison: None.

CLINICAL DATA: Chronic bilateral hip pain.

EXAM:
BILATERAL SACROILIAC JOINTS - 3+ VIEW

[Series 1: dg si joints · 0.14mm/px · 3 of 3 slices shown]
[im 1/3]
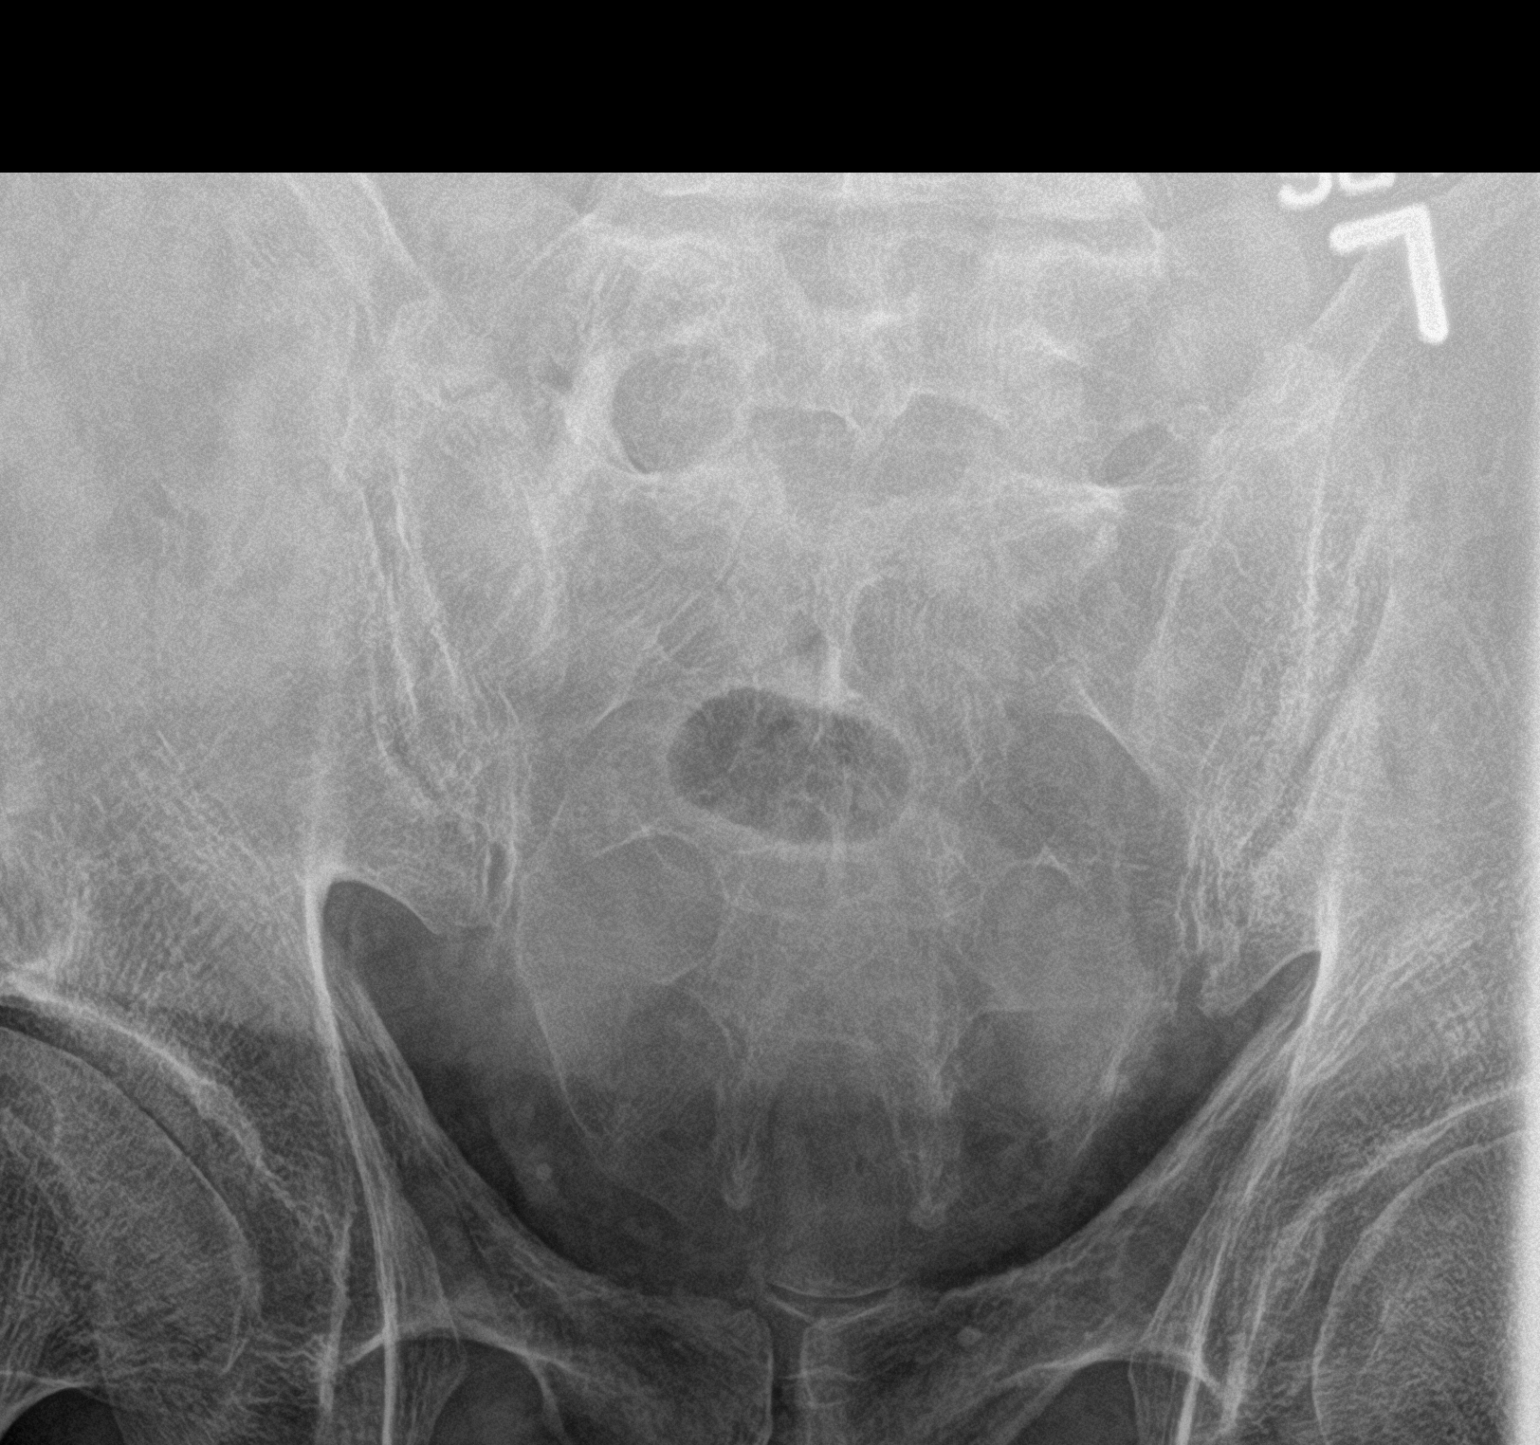
[im 2/3]
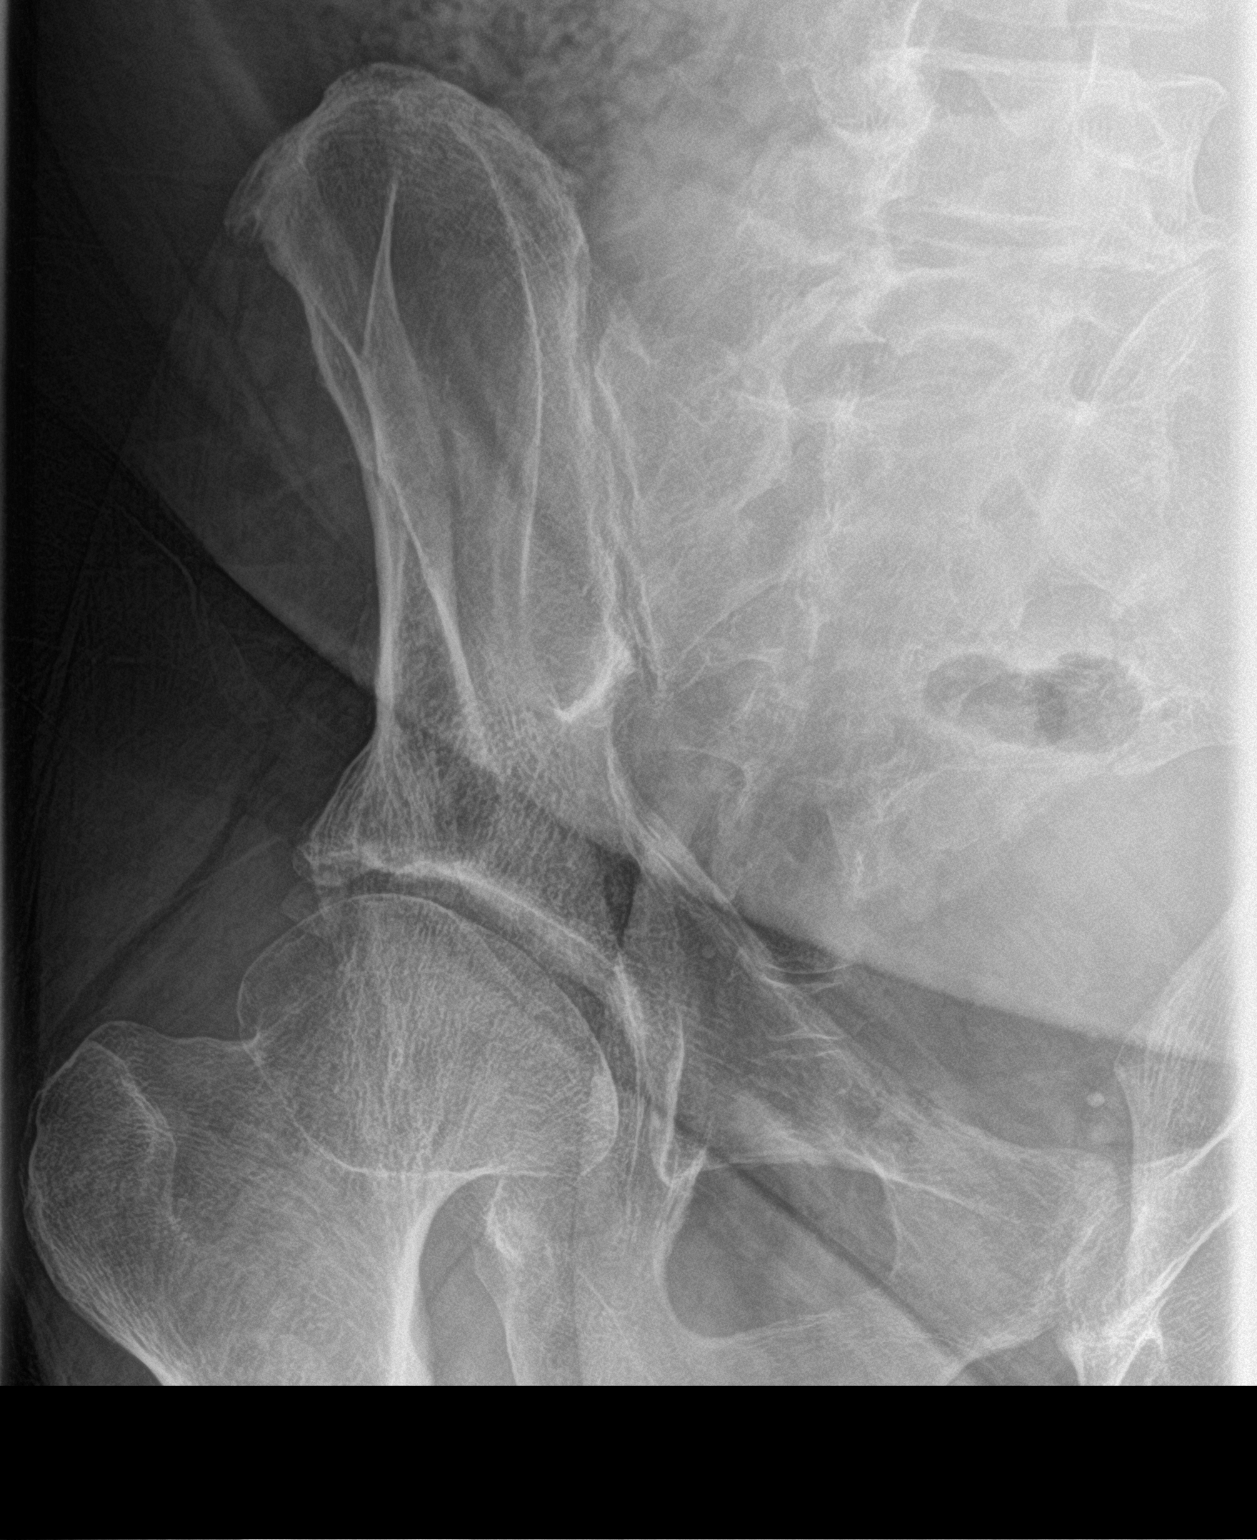
[im 3/3]
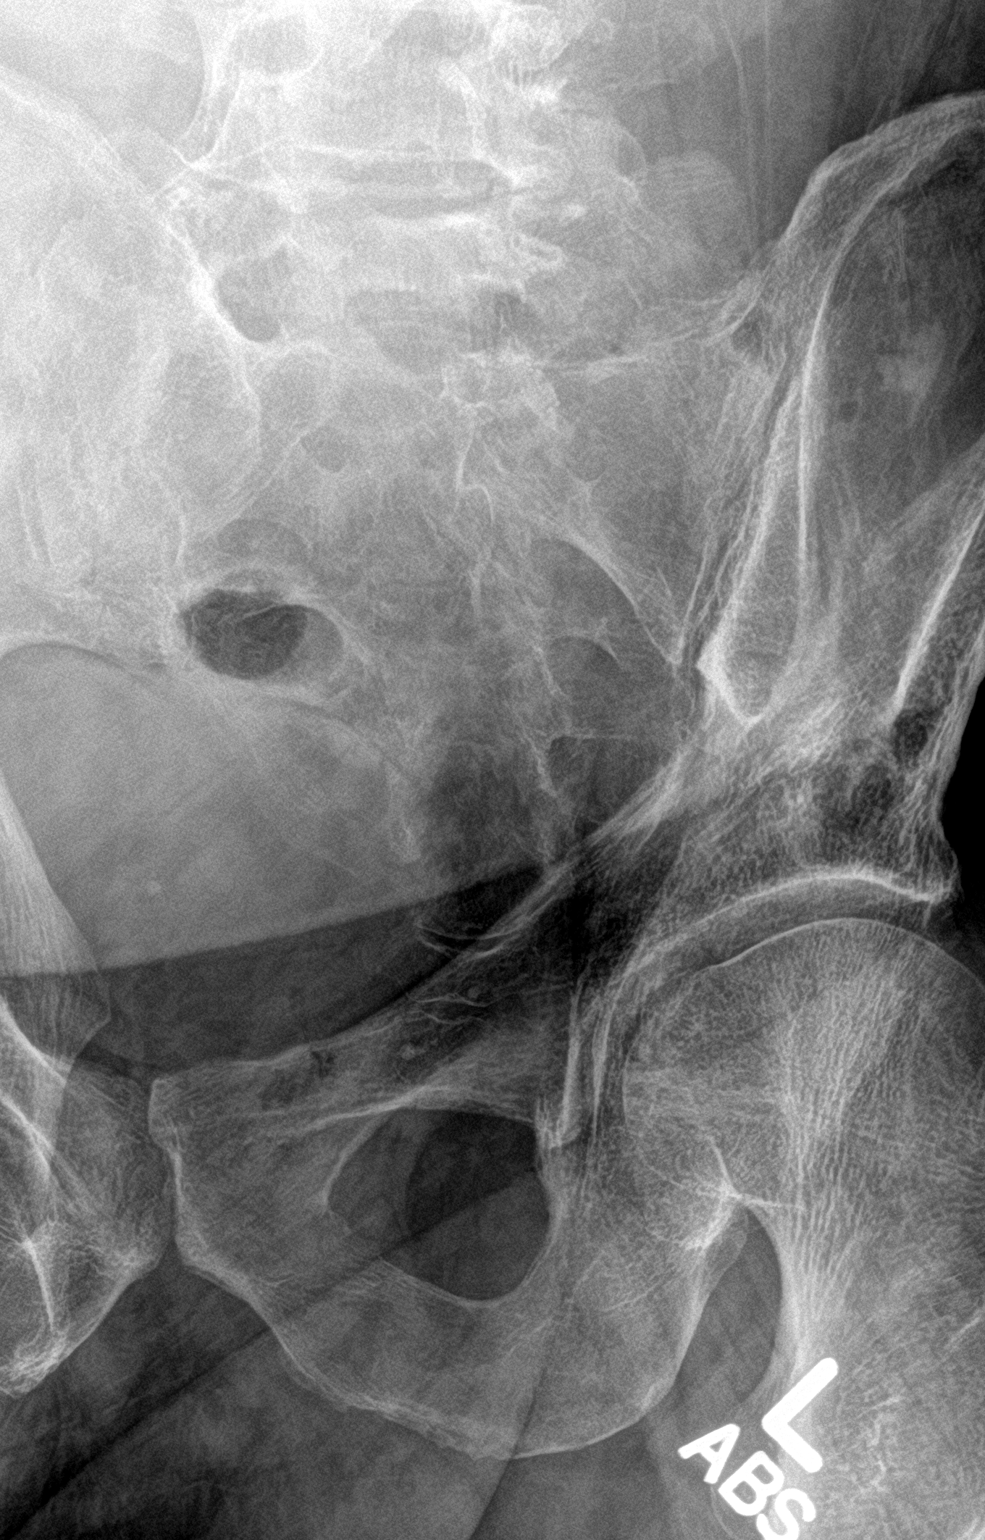

[3 of 3 positions shown; findings below may reference images not displayed]

FINDINGS: The sacroiliac joint spaces are maintained and there is no evidence
of arthropathy. Previously noted bilateral femoral head deformity
suggesting old, healed slipped capital femoral epiphyses.
IMPRESSION: 1. Normal appearing sacroiliac joints.
2. Probable old, healed slipped capital femoral epiphyses
bilaterally.

## 2021-02-10 ENCOUNTER — Telehealth: Payer: Self-pay | Admitting: Nurse Practitioner

## 2021-02-10 ENCOUNTER — Telehealth: Payer: Self-pay | Admitting: *Deleted

## 2021-02-10 DIAGNOSIS — K0889 Other specified disorders of teeth and supporting structures: Secondary | ICD-10-CM

## 2021-02-10 NOTE — Telephone Encounter (Signed)
Patient called and notified of results per office request:(agent called office and was asked if nurse triage could notify)  Please let patient know that his A1c increased slightly to 7.9.  We will continue with the 70U of insulin.  Otherwise lab work looks good.  Please let me know if he has any questions.

## 2021-02-10 NOTE — Telephone Encounter (Signed)
Copied from CRM [id number]. Topic: Referral - Request for Referral >> Dec 18, 2020  2:06 PM Wyonia Hough E wrote: Has patient seen PCP for this complaint? Yes  *If NO, is insurance requiring patient see PCP for this issue before PCP can refer them? Referral for which specialty: dentistry  Preferred provider/office:  Reason for referral: pt had dentures made but they do not fit and pt wants to know if Clydie Braun can refer him to another location/ pt needs help with the fit or new dentures /pt stated the placed that fixed them is not helping him / [facility] is the one that did the original dentures and pt does not want to go back to them since they have not helped much >> Feb 10, 2021  2:29 PM Wyonia Hough E wrote: Pt have insurance / he has [facility] / he is still needs dental referral to fix the size of his dentures/ please advise asap

## 2021-02-11 ENCOUNTER — Telehealth: Payer: Self-pay | Admitting: Nurse Practitioner

## 2021-02-11 NOTE — Telephone Encounter (Signed)
Copied from CRM #[id number]. Topic: Referral - Request for Referral °>> Dec 18, 2020  2:06 PM Corey [name], Corey [name]: °Has patient seen PCP for this complaint? Yes  °*If NO, is insurance requiring patient see PCP for this issue before PCP can refer them? °Referral for which specialty: dentistry  °Preferred provider/office:  °Reason for referral: pt had dentures made but they do not fit and pt wants to know if Karen can refer him to another location/ pt needs help with the fit or new dentures /pt stated the placed that fixed them is not helping him / [facility] is the one that did the original dentures and pt does not want to go back to them since they have not helped much °>> Feb 10, 2021  2:29 PM [name], Corey [name]: °Pt have insurance / he has [employer] / he is still needs dental referral to fix the size of his dentures/ please advise asap  °

## 2021-02-15 ENCOUNTER — Other Ambulatory Visit: Payer: Self-pay | Admitting: Nurse Practitioner

## 2021-02-15 DIAGNOSIS — K219 Gastro-esophageal reflux disease without esophagitis: Secondary | ICD-10-CM

## 2021-02-15 DIAGNOSIS — F3341 Major depressive disorder, recurrent, in partial remission: Secondary | ICD-10-CM

## 2021-02-15 NOTE — Telephone Encounter (Signed)
Requested medication (s) are due for refill today: yes  Requested medication (s) are on the active medication list: yes  Last refill:  08/18/20 #180 1 RF  Future visit scheduled: yes  Notes to clinic:  med not delegated to NT to RF/needs lab work   Requested Prescriptions  Pending Prescriptions Disp Refills   primidone (MYSOLINE) 50 MG tablet [Pharmacy Med Name: PRIMIDONE 50MG  TABLETS] 180 tablet     Sig: TAKE 2 TABLETS(100 MG) BY MOUTH DAILY     Not Delegated - Neurology:  Anticonvulsants Failed - 02/15/2021  6:22 AM      Failed - This refill cannot be delegated      Failed - HGB in normal range and within 360 days    Hemoglobin  Date Value Ref Range Status  02/24/2020 19.2 (H) 13.0 - 17.7 g/dL Final          Passed - HCT in normal range and within 360 days    Hematocrit  Date Value Ref Range Status  08/12/2020 46.8 37.5 - 51.0 % Final          Passed - PLT in normal range and within 360 days    Platelets  Date Value Ref Range Status  02/24/2020 193 150 - 450 x10E3/uL Final          Passed - WBC in normal range and within 360 days    WBC  Date Value Ref Range Status  02/24/2020 10.8 3.4 - 10.8 x10E3/uL Final  01/14/2020 9.5 4.0 - 10.5 K/uL Final          Passed - Valid encounter within last 12 months    Recent Outpatient Visits           1 week ago Hypertension associated with type 2 diabetes mellitus ([place])   [facility] Jon Billings, NP   3 months ago Hypertension associated with type 2 diabetes mellitus ([place])   [facility] Jon Billings, NP   6 months ago Hypertension associated with type 2 diabetes mellitus ([place])   [facility] Jon Billings, NP   6 months ago Type 2 diabetes mellitus with hyperglycemia, with long-term current use of insulin ([place])   [place], Megan P, DO   9 months ago Coronary artery disease of native artery of native heart with stable angina pectoris ([place])    [place] Jon Billings, NP       Future Appointments             In 1 week Ralene Bathe, MD [place]   In 2 months Jon Billings, NP [facility], [place]   In 6 months Stoioff, Ronda Fairly, MD [place]   In 9 months  [place], [facility]            Signed Prescriptions Disp Refills   pantoprazole (PROTONIX) 40 MG tablet 180 tablet 0    Sig: TAKE 1 TABLET(40 MG) BY MOUTH TWICE DAILY BEFORE A MEAL     Gastroenterology: Proton Pump Inhibitors Passed - 02/15/2021  6:22 AM      Passed - Valid encounter within last 12 months    Recent Outpatient Visits           1 week ago Hypertension associated with type 2 diabetes mellitus ([place])   [facility] Jon Billings, NP   3 months ago Hypertension associated with type 2 diabetes mellitus ([place])   [facility] Jon Billings, NP  6 months ago Hypertension associated with type 2 diabetes mellitus ([place])   [facility] Jon Billings, NP   6 months ago Type 2 diabetes mellitus with hyperglycemia, with long-term current use of insulin ([place])   [place], Megan P, DO   9 months ago Coronary artery disease of native artery of native heart with stable angina pectoris ([place])   [place] Jon Billings, NP       Future Appointments             In 1 week Ralene Bathe, MD [place]   In 2 months Jon Billings, NP [facility], [place]   In 6 months Stoioff, Ronda Fairly, MD [place]   In 9 months  [place], [facility]             famotidine (PEPCID) 40 MG tablet 90 tablet 0    Sig: TAKE 1 TABLET(40 MG) BY MOUTH DAILY     Gastroenterology:  H2 Antagonists Passed - 02/15/2021  6:22 AM      Passed - Valid encounter within last 12 months    Recent Outpatient Visits           1 week ago Hypertension associated with type  2 diabetes mellitus ([place])   [facility] Jon Billings, NP   3 months ago Hypertension associated with type 2 diabetes mellitus ([place])   [facility] Jon Billings, NP   6 months ago Hypertension associated with type 2 diabetes mellitus ([place])   [facility] Billings, NP   6 months ago Type 2 diabetes mellitus with hyperglycemia, with long-term current use of insulin ([place])   [place], Megan P, DO   9 months ago Coronary artery disease of native artery of native heart with stable angina pectoris ([place])   [place] Jon Billings, NP       Future Appointments             In 1 week Ralene Bathe, MD [place]   In 2 months Jon Billings, NP [facility], [place]   In 6 months Stoioff, Ronda Fairly, MD [place]   In 9 months  [place], [facility]             buPROPion (WELLBUTRIN XL) 150 MG 24 hr tablet 90 tablet 1    Sig: TAKE 1 TABLET(150 MG) BY MOUTH DAILY     Psychiatry: Antidepressants - bupropion Passed - 02/15/2021  6:22 AM      Passed - Completed PHQ-2 or PHQ-9 in the last 360 days      Passed - Last BP in normal range    BP Readings from Last 1 Encounters:  02/04/21 (!) 103/55          Passed - Valid encounter within last 6 months    Recent Outpatient Visits           1 week ago Hypertension associated with type 2 diabetes mellitus ([place])   [facility] Jon Billings, NP   3 months ago Hypertension associated with type 2 diabetes mellitus ([place])   [facility] NP   6 months ago Hypertension associated with type 2 diabetes mellitus ([place])   [facility] Jon Billings, NP   6 months ago Type 2 diabetes mellitus with hyperglycemia, with long-term current use of insulin [facility])   [facility] [place],  DO   9 months ago Coronary artery disease  of native artery of native heart with stable angina pectoris [facility])   [facility] Jon Billings, NP       Future Appointments             In 1 week Ralene Bathe, MD [place]   In 2 months Jon Billings, NP [facility], [facility]   In 6 months Stoioff, Ronda Fairly, MD [place]   In 9 months  [facility], [place]

## 2021-02-15 NOTE — Telephone Encounter (Signed)
Requested Prescriptions  Pending Prescriptions Disp Refills   pantoprazole (PROTONIX) 40 MG tablet [Pharmacy Med Name: PANTOPRAZOLE 40MG  TABLETS] 180 tablet 0    Sig: TAKE 1 TABLET(40 MG) BY MOUTH TWICE DAILY BEFORE A MEAL     Gastroenterology: Proton Pump Inhibitors Passed - 02/15/2021  6:22 AM      Passed - Valid encounter within last 12 months    Recent Outpatient Visits          1 week ago Hypertension associated with type 2 diabetes mellitus ([place])   [facility] Jon Billings, NP   3 months ago Hypertension associated with type 2 diabetes mellitus ([place])   [facility] Jon Billings, NP   6 months ago Hypertension associated with type 2 diabetes mellitus ([place])   [facility] Jon Billings, NP   6 months ago Type 2 diabetes mellitus with hyperglycemia, with long-term current use of insulin ([place])   [place], Megan P, DO   9 months ago Coronary artery disease of native artery of native heart with stable angina pectoris ([place])   [place] Jon Billings, NP      Future Appointments            In 1 week Ralene Bathe, MD [place]   In 2 months Jon Billings, NP [facility], [place]   In 6 months Stoioff, Ronda Fairly, MD [place]   In 9 months  [place], [facility]            famotidine (PEPCID) 40 MG tablet [Pharmacy Med Name: FAMOTIDINE 40MG  TABLETS] 90 tablet 0    Sig: TAKE 1 TABLET(40 MG) BY MOUTH DAILY     Gastroenterology:  H2 Antagonists Passed - 02/15/2021  6:22 AM      Passed - Valid encounter within last 12 months    Recent Outpatient Visits          1 week ago Hypertension associated with type 2 diabetes mellitus ([place])   [facility] Jon Billings, NP   3 months ago Hypertension associated with type 2 diabetes mellitus ([place])   [facility] Jon Billings, NP   6 months ago Hypertension  associated with type 2 diabetes mellitus ([place])   [facility] Jon Billings, NP   6 months ago Type 2 diabetes mellitus with hyperglycemia, with long-term current use of insulin ([place])   [place], Megan P, DO   9 months ago Coronary artery disease of native artery of native heart with stable angina pectoris ([place])   [place] Jon Billings, NP      Future Appointments            In 1 week Ralene Bathe, MD [place]   In 2 months Jon Billings, NP [facility], [facility]   In 6 months Stoioff, Ronda Fairly, MD [place]   In 9 months  [place], [facility]            buPROPion (WELLBUTRIN XL) 150 MG 24 hr tablet [Pharmacy Med Name: BUPROPION XL 150MG  TABLETS (24 H)] 90 tablet 1    Sig: TAKE 1 TABLET(150 MG) BY MOUTH DAILY     Psychiatry: Antidepressants - bupropion Passed - 02/15/2021  6:22 AM      Passed - Completed PHQ-2 or PHQ-9 in the last 360 days      Passed - Last BP in normal range  BP Readings from Last 1 Encounters:  02/04/21 (!) 103/55         Passed - Valid encounter within last 6 months    Recent Outpatient Visits          1 week ago Hypertension associated with type 2 diabetes mellitus ([place])   [facility] Jon Billings, NP   3 months ago Hypertension associated with type 2 diabetes mellitus ([place])   [facility] Jon Billings, NP   6 months ago Hypertension associated with type 2 diabetes mellitus ([place])   [facility] Jon Billings, NP   6 months ago Type 2 diabetes mellitus with hyperglycemia, with long-term current use of insulin ([place])   [place], Megan P, DO   9 months ago Coronary artery disease of native artery of native heart with stable angina pectoris ([place])   [facility] Jon Billings, NP      Future Appointments            In 1 week Ralene Bathe,  MD [place]   In 2 months Jon Billings, NP [facility], [facility]   In 6 months Stoioff, Ronda Fairly, MD [place]   In 9 months  [place], [facility]            primidone (MYSOLINE) 50 MG tablet [Pharmacy Med Name: PRIMIDONE 50MG  TABLETS] 180 tablet     Sig: TAKE 2 TABLETS(100 MG) BY MOUTH DAILY     Not Delegated - Neurology:  Anticonvulsants Failed - 02/15/2021  6:22 AM      Failed - This refill cannot be delegated      Failed - HGB in normal range and within 360 days    Hemoglobin  Date Value Ref Range Status  02/24/2020 19.2 (H) 13.0 - 17.7 g/dL Final         Passed - HCT in normal range and within 360 days    Hematocrit  Date Value Ref Range Status  08/12/2020 46.8 37.5 - 51.0 % Final         Passed - PLT in normal range and within 360 days    Platelets  Date Value Ref Range Status  02/24/2020 193 150 - 450 x10E3/uL Final         Passed - WBC in normal range and within 360 days    WBC  Date Value Ref Range Status  02/24/2020 10.8 3.4 - 10.8 x10E3/uL Final  01/14/2020 9.5 4.0 - 10.5 K/uL Final         Passed - Valid encounter within last 12 months    Recent Outpatient Visits          1 week ago Hypertension associated with type 2 diabetes mellitus ([place])   [facility] Jon Billings, NP   3 months ago Hypertension associated with type 2 diabetes mellitus ([place])   [facility] Jon Billings, NP   6 months ago Hypertension associated with type 2 diabetes mellitus ([employer])   [facility] Jon Billings, NP   6 months ago Type 2 diabetes mellitus with hyperglycemia, with long-term current use of insulin ([place])   [place], Megan P, DO   9 months ago Coronary artery disease of native artery of native heart with stable angina pectoris ([place])   [place], NP      Future Appointments            In 1 week  Ralene Bathe, MD [place]   In 2 months Jon Billings, NP [facility], [facility]   In 6 months Stoioff, Ronda Fairly, MD [place]   In 9 months  [facility] [place]

## 2021-02-17 ENCOUNTER — Telehealth: Payer: Self-pay

## 2021-02-17 NOTE — Chronic Care Management (AMB) (Signed)
Chronic Care Management Pharmacy Assistant   Name: Corey [name]  MRN: [id number] DOB: 06-19-1955  Reason for Encounter: Disease State General  Recent office visits:  02/04/21-Corey Mathis Dad, NP (PCP) General follow up visit. Labs ordered. Use compression pumps BID. Continue 70 units of lantus. Follow up in 3 months. 10/29/20-Corey Mathis Dad, NP (PCP) Diabetic and hypertension follow up visit. Labs ordered. Start checking blood pressure at home. Decrease lantus to 70 units daily from 80 units. Flu vaccine given. Follow up in 3 months.  Recent consult visits:  12/17/20-Corey [name], DPM (Podiatry) Seen for a nail problem. Follow up in 3 months. 12/15/20-Corey [name] (Pulmonology) Notes not available. 12/07/20- Corey Miss, MD (Vascular surgeon) Seen for leg swelling. Follow up in 6 months. 11/18/20-Corey Kayren Eaves, MD (Cardiology) 1 month follow up visit. Follow up in 4 months. 11/11/20-Corey E. Owens Shark, NP (Vascular surgeon) Follow up visit. 11/02/20-Corey Eloise Levels, MD (Vascular surgeon) Initial visit. Start wearing compression socks. Follow up in 1 month. 10/21/20-Corey Audree Camel, MD (Dermatology) Initial patient visit. Follow up in 4 weeks. 10/14/20-Corey [name] (Cardiology) Please take 2 tablets of the Furosemide (total of 50m) in the morning and 1 tablet of the furosemide (total of 266m in the afternoon daily for the next three days. On Saturday go back to taking only 1 tablet of the Furosemide (total of 2066mtwice daily. Follow up in 1 month. 09/10/20-Corey P. PatPosey ProntoPM (Podiatry) Seen for follow up on Onychodystrophy. 0809/22-Corey Lateef, MD (Pain Medicine) Follow up in 6 months. 08/31/20-Corey SusKristine Lineandocrinology) Diabetic follow up. Start on [employer] monitor. Follow up in 3 months. 08/20/20-Corey [name] (Urology) Seen for Hpypogonadism. Follow up in 1 year.  Hospital visits:  None in previous 6  months  Medications: Outpatient Encounter Medications as of 02/17/2021  Medication Sig   Accu-Chek FastClix Lancets MISC USE TO CHECK BLOOD SUGAR AS DIRECTED   acetaminophen (TYLENOL) 500 MG tablet Take 1,000 mg by mouth every 6 (six) hours as needed for moderate pain or headache.   albuterol (PROVENTIL) (2.5 MG/3ML) 0.083% nebulizer solution USE 1 VIAL IN NEBULIZER EVERY 6 HOURS - and as needed.   albuterol (VENTOLIN HFA) 108 (90 Base) MCG/ACT inhaler INHALE 2 PUFFS INTO THE LUNGS EVERY 6 HOURS AS NEEDED FOR WHEEZING OR SHORTNESS OF BREATH (Patient taking differently: Inhale 2 puffs into the lungs every 6 (six) hours as needed for wheezing or shortness of breath.)   amitriptyline (ELAVIL) 50 MG tablet TAKE 1 TO 2 TABLETS(50 TO 100 MG) BY MOUTH AT BEDTIME   aspirin EC 81 MG tablet Take 81 mg by mouth daily.   azelastine (ASTELIN) 0.1 % nasal spray USE 2 SPRAYS IN EACH NOSTRIL TWICE DAILY   B-D UF III MINI PEN NEEDLES 31G X 5 MM MISC USE TWICE DAILY   Blood Glucose Monitoring Suppl (ONETOUCH VERIO) w/Device KIT Use to check blood sugar 2-3 times daily and document for provider visits.   buPROPion (WELLBUTRIN XL) 150 MG 24 hr tablet TAKE 1 TABLET(150 MG) BY MOUTH DAILY   cetirizine (ZYRTEC) 10 MG tablet TAKE 1 TABLET BY MOUTH DAILY   Cholecalciferol (VITAMIN D3) 5000 units TABS Take 5,000 Units by mouth daily.    ciclopirox (PENLAC) 8 % solution Apply topically at bedtime. Apply over nail and surrounding skin. Apply daily over previous coat. After seven (7) days, may remove with alcohol and continue cycle.   citalopram (CELEXA) 10 MG tablet TAKE 1 TABLET BY MOUTH  DAILY  clopidogrel (PLAVIX) 75 MG tablet Take 75 mg by mouth daily.   Continuous Blood Gluc Receiver (FREESTYLE LIBRE 14 DAY READER) DEVI 1 Units by Does not apply route in the morning, at noon, in the evening, and at bedtime.   Continuous Blood Gluc Sensor (FREESTYLE LIBRE 14 DAY SENSOR) MISC 1 Units by Does not apply route every 14  (fourteen) days.   diclofenac sodium (VOLTAREN) 1 % GEL Apply 1 application topically 4 (four) times daily as needed (pain).    famotidine (PEPCID) 40 MG tablet TAKE 1 TABLET(40 MG) BY MOUTH DAILY   fenofibrate (TRICOR) 145 MG tablet TAKE 1 TABLET BY MOUTH  DAILY   furosemide (LASIX) 20 MG tablet TAKE 1 TABLET(20 MG) BY MOUTH TWICE DAILY   glucose blood test strip Use to check blood sugar 2-3 times daily and document for provider visits.   isosorbide mononitrate (IMDUR) 60 MG 24 hr tablet Take 60 mg by mouth daily.   JARDIANCE 25 MG TABS tablet Take 1 tablet (25 mg total) by mouth daily.   ketoconazole (NIZORAL) 2 % cream Apply to corners of mouth at bedtime   ketorolac (TORADOL) 10 MG tablet Take 1 tablet (10 mg total) by mouth every 6 (six) hours as needed.   Lancets Misc. (ACCU-CHEK FASTCLIX LANCET) KIT 1 Units by Does not apply route 2 (two) times daily.   LANTUS SOLOSTAR 100 UNIT/ML Solostar Pen Inject 85 Units into the skin at bedtime. (Patient taking differently: Inject 70 Units into the skin at bedtime.)   losartan (COZAAR) 50 MG tablet Take 1 tablet (50 mg total) by mouth daily.   magnesium gluconate (MAGONATE) 500 MG tablet Take 500 mg by mouth daily.   meloxicam (MOBIC) 7.5 MG tablet TAKE 1 TABLET(7.5 MG) BY MOUTH DAILY   metFORMIN (GLUCOPHAGE) 1000 MG tablet TAKE 1 TABLET BY MOUTH  TWICE DAILY   metoprolol succinate (TOPROL-XL) 25 MG 24 hr tablet Take 25 mg by mouth daily.   montelukast (SINGULAIR) 10 MG tablet TAKE 1 TABLET(10 MG) BY MOUTH DAILY (Patient taking differently: Take 10 mg by mouth at bedtime.)   NEEDLE, DISP, 18 G 18G X 1" MISC Use as directed   nitroGLYCERIN (NITROSTAT) 0.4 MG SL tablet Place under the tongue.   pantoprazole (PROTONIX) 40 MG tablet TAKE 1 TABLET(40 MG) BY MOUTH TWICE DAILY BEFORE A MEAL   pregabalin (LYRICA) 150 MG capsule Take 1 capsule (150 mg total) by mouth 2 (two) times daily.   primidone (MYSOLINE) 50 MG tablet TAKE 2 TABLETS(100 MG) BY MOUTH  DAILY   rosuvastatin (CRESTOR) 20 MG tablet TAKE 1 TABLET BY MOUTH  DAILY   Semaglutide, 1 MG/DOSE, 4 MG/3ML SOPN Inject 1 mg into the skin once a week.   SYRINGE-NEEDLE, DISP, 3 ML (LUER LOCK SAFETY SYRINGES) 21G X 1-1/2" 3 ML MISC Use as directed for testosterone administration   testosterone cypionate (DEPOTESTOSTERONE CYPIONATE) 200 MG/ML injection ADMINISTER 1.5 ML(300 MG) IN THE MUSCLE EVERY 14 DAYS   tiotropium (SPIRIVA) 18 MCG inhalation capsule Place into inhaler and inhale.   tiZANidine (ZANAFLEX) 4 MG tablet Take 1 tablet (4 mg total) by mouth every 8 (eight) hours as needed for muscle spasms.   triamcinolone (KENALOG) 0.025 % ointment Apply 1 application topically 2 (two) times daily.   WIXELA INHUB 250-50 MCG/DOSE AEPB Inhale 1 puff into the lungs 2 (two) times daily.   No facility-administered encounter medications on file as of 02/17/2021.   Unsuccessful attempt to complete assessment call. I have attempted to  reach out 3x and left 3 voicemail's as well.   Care Gaps: Zoster Vaccines- Shingrix:Overdue since 09/08/2016  Star Rating Drugs: Rosuvastatin 20 mg Last filled:02/11/21 90 DS Metformin 1000 mg Last filled:01/11/21 90 DS Jardiance 25 mg Last filled:12/03/20 90 DS Losartan 50 mg Last filled:02/27/20 90 DS  Myriam Elta [place], [place]

## 2021-02-19 ENCOUNTER — Telehealth: Payer: Self-pay | Admitting: Nurse Practitioner

## 2021-02-19 NOTE — Telephone Encounter (Signed)
Attempted to contact patient no answer LVM advising patient monitor had been sent to pharmacy on 02/04/21. Advised patient to call back if any questions or concerns.

## 2021-02-19 NOTE — Telephone Encounter (Signed)
Pt called and stated that he spoke with provider about calling in new glucose monitor. Patient would like a status update. Please advise

## 2021-02-22 ENCOUNTER — Other Ambulatory Visit: Payer: Self-pay

## 2021-02-22 ENCOUNTER — Telehealth: Payer: Self-pay | Admitting: Nurse Practitioner

## 2021-02-22 NOTE — Telephone Encounter (Signed)
Patient called in for medical reason wny patient passed away. Des she need to wait for autopsy? PLease call back

## 2021-02-24 ENCOUNTER — Ambulatory Visit: Payer: Medicare Other | Admitting: Dermatology

## 2021-03-03 DEATH — deceased

## 2021-03-05 ENCOUNTER — Other Ambulatory Visit: Payer: Self-pay | Admitting: Nurse Practitioner

## 2021-03-05 NOTE — Telephone Encounter (Signed)
Requested medication (s) are due for refill today:   Yes  Requested medication (s) are on the active medication list:   Yes  Future visit scheduled:   Yes   Last ordered: 02/24/2020 3 ml, 2 refills  Returned because didn't know how much [name] wants dispensed with how many refills   Requested Prescriptions  Pending Prescriptions Disp Refills   albuterol (PROVENTIL) (2.5 MG/3ML) 0.083% nebulizer solution [Pharmacy Med Name: albuterol sulfate 2.5 mg/3 mL (0.083 %) solution for nebulization]  11    Sig: USE 1 VIAL IN NEBULIZER EVERY 6 HOURS - and as needed.     Pulmonology:  Beta Agonists 2 Passed - 03/05/2021 11:22 AM      Passed - Last BP in normal range    BP Readings from Last 1 Encounters:  02/04/21 (!) 103/55          Passed - Last Heart Rate in normal range    Pulse Readings from Last 1 Encounters:  02/04/21 87          Passed - Valid encounter within last 12 months    Recent Outpatient Visits           4 weeks ago Hypertension associated with type 2 diabetes mellitus (HCC)   [facility] [name], NP   4 months ago Hypertension associated with type 2 diabetes mellitus (HCC)   [facility] [name], NP   7 months ago Hypertension associated with type 2 diabetes mellitus (HCC)   [facility] [name], NP   7 months ago Type 2 diabetes mellitus with hyperglycemia, with long-term current use of insulin (HCC)   [facility] [place], Megan P, DO   10 months ago Coronary artery disease of native artery of native heart with stable angina pectoris (HCC)   [facility] [name], NP       Future Appointments             In 2 months [name], NP [facility], [facility]   In 5 months Stoioff, Verna Czech, MD [facility]   In 8 months  [facility]

## 2021-03-09 ENCOUNTER — Encounter: Payer: Medicare Other | Admitting: Student in an Organized Health Care Education/Training Program

## 2021-03-16 ENCOUNTER — Telehealth: Payer: Medicare Other

## 2021-03-16 ENCOUNTER — Telehealth: Payer: Self-pay

## 2021-03-16 NOTE — Telephone Encounter (Signed)
°  Care Management   Follow Up Note   03/16/2021 Name: Corey [name] MRN: [id number] DOB: 05-20-1955   Referred by: [name], NP Reason for referral : Chronic Care Management (RNCM: Follow up for Chronic Disease Management and Care Coordination and confirm death date of 03/03/21)   Regular outreach scheduled for today. When reviewing the chart there was information from [facility] that the patient was deceased. A call attempt was made. Ileene Hutchinson found for the patient and confirmed that the patient Corey [name] had passed away on 03-03-21.  Closing chart and information out.   Follow Up Plan: No further follow up required: patient expired on 03-03-21  Alto Denver RN, MSN, CCM Community Care Coordinator [place]   [facility] Mobile: [phone]

## 2021-04-20 ENCOUNTER — Ambulatory Visit: Payer: Medicare Other | Admitting: Podiatry

## 2021-05-05 ENCOUNTER — Ambulatory Visit: Payer: Medicare Other | Admitting: Nurse Practitioner

## 2021-05-16 ENCOUNTER — Other Ambulatory Visit: Payer: Self-pay | Admitting: Nurse Practitioner

## 2021-05-16 DIAGNOSIS — K219 Gastro-esophageal reflux disease without esophagitis: Secondary | ICD-10-CM

## 2021-06-07 ENCOUNTER — Encounter (INDEPENDENT_AMBULATORY_CARE_PROVIDER_SITE_OTHER): Payer: Self-pay

## 2021-06-07 ENCOUNTER — Ambulatory Visit (INDEPENDENT_AMBULATORY_CARE_PROVIDER_SITE_OTHER): Payer: Medicare Other | Admitting: Vascular Surgery

## 2021-08-23 ENCOUNTER — Other Ambulatory Visit: Payer: Self-pay

## 2021-08-25 ENCOUNTER — Ambulatory Visit: Payer: Self-pay | Admitting: Urology

## 2021-11-19 ENCOUNTER — Ambulatory Visit: Payer: Medicare Other

## 2022-02-10 ENCOUNTER — Other Ambulatory Visit: Payer: Self-pay | Admitting: Nurse Practitioner

## 2022-02-10 DIAGNOSIS — E1141 Type 2 diabetes mellitus with diabetic mononeuropathy: Secondary | ICD-10-CM
